# Patient Record
Sex: Female | Born: 1975 | Race: Black or African American | Hispanic: No | Marital: Single | Smoking: Never smoker
Health system: Southern US, Community
[De-identification: ages and names within clinical notes are randomized; demographics above are authoritative.]

## PROBLEM LIST (undated history)

## (undated) DIAGNOSIS — K589 Irritable bowel syndrome without diarrhea: Secondary | ICD-10-CM

## (undated) DIAGNOSIS — R011 Cardiac murmur, unspecified: Secondary | ICD-10-CM

## (undated) DIAGNOSIS — E669 Obesity, unspecified: Secondary | ICD-10-CM

## (undated) DIAGNOSIS — N83209 Unspecified ovarian cyst, unspecified side: Secondary | ICD-10-CM

## (undated) DIAGNOSIS — R51 Headache: Secondary | ICD-10-CM

## (undated) DIAGNOSIS — R600 Localized edema: Secondary | ICD-10-CM

## (undated) DIAGNOSIS — R7303 Prediabetes: Secondary | ICD-10-CM

## (undated) DIAGNOSIS — F32A Depression, unspecified: Secondary | ICD-10-CM

## (undated) DIAGNOSIS — E559 Vitamin D deficiency, unspecified: Secondary | ICD-10-CM

## (undated) DIAGNOSIS — T7840XA Allergy, unspecified, initial encounter: Secondary | ICD-10-CM

## (undated) DIAGNOSIS — G629 Polyneuropathy, unspecified: Secondary | ICD-10-CM

## (undated) DIAGNOSIS — M549 Dorsalgia, unspecified: Secondary | ICD-10-CM

## (undated) DIAGNOSIS — M1990 Unspecified osteoarthritis, unspecified site: Secondary | ICD-10-CM

## (undated) DIAGNOSIS — K219 Gastro-esophageal reflux disease without esophagitis: Secondary | ICD-10-CM

## (undated) DIAGNOSIS — I10 Essential (primary) hypertension: Secondary | ICD-10-CM

## (undated) DIAGNOSIS — M2550 Pain in unspecified joint: Secondary | ICD-10-CM

## (undated) DIAGNOSIS — F329 Major depressive disorder, single episode, unspecified: Secondary | ICD-10-CM

## (undated) HISTORY — DX: Vitamin D deficiency, unspecified: E55.9

## (undated) HISTORY — DX: Dorsalgia, unspecified: M54.9

## (undated) HISTORY — DX: Polyneuropathy, unspecified: G62.9

## (undated) HISTORY — DX: Irritable bowel syndrome, unspecified: K58.9

## (undated) HISTORY — DX: Pain in unspecified joint: M25.50

## (undated) HISTORY — DX: Allergy, unspecified, initial encounter: T78.40XA

## (undated) HISTORY — DX: Gastro-esophageal reflux disease without esophagitis: K21.9

## (undated) HISTORY — DX: Obesity, unspecified: E66.9

## (undated) HISTORY — DX: Localized edema: R60.0

## (undated) HISTORY — DX: Unspecified osteoarthritis, unspecified site: M19.90

## (undated) HISTORY — DX: Prediabetes: R73.03

---
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
## 1996-06-03 HISTORY — PX: TUBAL LIGATION: SHX77

## 1998-02-12 ENCOUNTER — Inpatient Hospital Stay (HOSPITAL_COMMUNITY): Admission: AD | Admit: 1998-02-12 | Discharge: 1998-02-12 | Payer: Self-pay | Admitting: *Deleted

## 1998-03-23 ENCOUNTER — Emergency Department (HOSPITAL_COMMUNITY): Admission: EM | Admit: 1998-03-23 | Discharge: 1998-03-24 | Payer: Self-pay | Admitting: Emergency Medicine

## 1998-04-30 ENCOUNTER — Inpatient Hospital Stay (HOSPITAL_COMMUNITY): Admission: AD | Admit: 1998-04-30 | Discharge: 1998-04-30 | Payer: Self-pay | Admitting: *Deleted

## 1999-02-17 ENCOUNTER — Inpatient Hospital Stay (HOSPITAL_COMMUNITY): Admission: AD | Admit: 1999-02-17 | Discharge: 1999-02-17 | Payer: Self-pay | Admitting: *Deleted

## 1999-05-05 ENCOUNTER — Emergency Department (HOSPITAL_COMMUNITY): Admission: EM | Admit: 1999-05-05 | Discharge: 1999-05-05 | Payer: Self-pay | Admitting: Internal Medicine

## 1999-05-05 ENCOUNTER — Encounter: Payer: Self-pay | Admitting: Family Medicine

## 1999-05-20 ENCOUNTER — Emergency Department (HOSPITAL_COMMUNITY): Admission: EM | Admit: 1999-05-20 | Discharge: 1999-05-20 | Payer: Self-pay | Admitting: Emergency Medicine

## 1999-07-13 ENCOUNTER — Inpatient Hospital Stay (HOSPITAL_COMMUNITY): Admission: AD | Admit: 1999-07-13 | Discharge: 1999-07-13 | Payer: Self-pay | Admitting: *Deleted

## 1999-10-20 ENCOUNTER — Encounter: Payer: Self-pay | Admitting: Emergency Medicine

## 1999-10-20 ENCOUNTER — Emergency Department (HOSPITAL_COMMUNITY): Admission: EM | Admit: 1999-10-20 | Discharge: 1999-10-20 | Payer: Self-pay | Admitting: Emergency Medicine

## 1999-11-08 ENCOUNTER — Emergency Department (HOSPITAL_COMMUNITY): Admission: EM | Admit: 1999-11-08 | Discharge: 1999-11-08 | Payer: Self-pay | Admitting: Emergency Medicine

## 1999-11-08 ENCOUNTER — Encounter: Payer: Self-pay | Admitting: Emergency Medicine

## 1999-11-20 ENCOUNTER — Emergency Department (HOSPITAL_COMMUNITY): Admission: EM | Admit: 1999-11-20 | Discharge: 1999-11-20 | Payer: Self-pay

## 1999-12-15 ENCOUNTER — Emergency Department (HOSPITAL_COMMUNITY): Admission: EM | Admit: 1999-12-15 | Discharge: 1999-12-15 | Payer: Self-pay | Admitting: *Deleted

## 2000-09-14 ENCOUNTER — Emergency Department (HOSPITAL_COMMUNITY): Admission: EM | Admit: 2000-09-14 | Discharge: 2000-09-15 | Payer: Self-pay | Admitting: Emergency Medicine

## 2000-09-22 ENCOUNTER — Emergency Department (HOSPITAL_COMMUNITY): Admission: EM | Admit: 2000-09-22 | Discharge: 2000-09-22 | Payer: Self-pay | Admitting: Emergency Medicine

## 2000-09-29 ENCOUNTER — Emergency Department (HOSPITAL_COMMUNITY): Admission: EM | Admit: 2000-09-29 | Discharge: 2000-09-29 | Payer: Self-pay | Admitting: Emergency Medicine

## 2000-10-18 ENCOUNTER — Emergency Department (HOSPITAL_COMMUNITY): Admission: EM | Admit: 2000-10-18 | Discharge: 2000-10-18 | Payer: Self-pay | Admitting: Emergency Medicine

## 2001-03-09 ENCOUNTER — Emergency Department (HOSPITAL_COMMUNITY): Admission: EM | Admit: 2001-03-09 | Discharge: 2001-03-10 | Payer: Self-pay | Admitting: Emergency Medicine

## 2001-03-10 ENCOUNTER — Encounter: Payer: Self-pay | Admitting: Emergency Medicine

## 2001-09-27 ENCOUNTER — Emergency Department (HOSPITAL_COMMUNITY): Admission: EM | Admit: 2001-09-27 | Discharge: 2001-09-27 | Payer: Self-pay

## 2001-09-27 ENCOUNTER — Encounter: Payer: Self-pay | Admitting: Emergency Medicine

## 2001-10-19 ENCOUNTER — Inpatient Hospital Stay (HOSPITAL_COMMUNITY): Admission: AD | Admit: 2001-10-19 | Discharge: 2001-10-19 | Payer: Self-pay | Admitting: *Deleted

## 2002-02-27 ENCOUNTER — Emergency Department (HOSPITAL_COMMUNITY): Admission: EM | Admit: 2002-02-27 | Discharge: 2002-02-28 | Payer: Self-pay | Admitting: Emergency Medicine

## 2002-09-22 ENCOUNTER — Other Ambulatory Visit: Admission: RE | Admit: 2002-09-22 | Discharge: 2002-09-22 | Payer: Self-pay | Admitting: Obstetrics and Gynecology

## 2002-09-24 ENCOUNTER — Ambulatory Visit (HOSPITAL_COMMUNITY): Admission: RE | Admit: 2002-09-24 | Discharge: 2002-09-24 | Payer: Self-pay | Admitting: Obstetrics and Gynecology

## 2002-09-24 ENCOUNTER — Encounter: Payer: Self-pay | Admitting: Obstetrics and Gynecology

## 2003-09-28 ENCOUNTER — Emergency Department (HOSPITAL_COMMUNITY): Admission: EM | Admit: 2003-09-28 | Discharge: 2003-09-28 | Payer: Self-pay | Admitting: Family Medicine

## 2003-12-27 ENCOUNTER — Emergency Department (HOSPITAL_COMMUNITY): Admission: EM | Admit: 2003-12-27 | Discharge: 2003-12-28 | Payer: Self-pay | Admitting: Emergency Medicine

## 2004-03-22 ENCOUNTER — Emergency Department (HOSPITAL_COMMUNITY): Admission: EM | Admit: 2004-03-22 | Discharge: 2004-03-22 | Payer: Self-pay | Admitting: *Deleted

## 2004-06-07 ENCOUNTER — Other Ambulatory Visit: Admission: RE | Admit: 2004-06-07 | Discharge: 2004-06-07 | Payer: Self-pay | Admitting: Obstetrics and Gynecology

## 2005-02-03 ENCOUNTER — Emergency Department (HOSPITAL_COMMUNITY): Admission: EM | Admit: 2005-02-03 | Discharge: 2005-02-03 | Payer: Self-pay | Admitting: Family Medicine

## 2005-06-26 ENCOUNTER — Emergency Department (HOSPITAL_COMMUNITY): Admission: EM | Admit: 2005-06-26 | Discharge: 2005-06-26 | Payer: Self-pay | Admitting: Family Medicine

## 2005-08-21 ENCOUNTER — Other Ambulatory Visit: Admission: RE | Admit: 2005-08-21 | Discharge: 2005-08-21 | Payer: Self-pay | Admitting: Obstetrics and Gynecology

## 2006-01-22 ENCOUNTER — Encounter: Admission: RE | Admit: 2006-01-22 | Discharge: 2006-01-22 | Payer: Self-pay | Admitting: Podiatry

## 2006-01-24 ENCOUNTER — Emergency Department (HOSPITAL_COMMUNITY): Admission: EM | Admit: 2006-01-24 | Discharge: 2006-01-24 | Payer: Self-pay | Admitting: Family Medicine

## 2006-02-01 ENCOUNTER — Emergency Department (HOSPITAL_COMMUNITY): Admission: EM | Admit: 2006-02-01 | Discharge: 2006-02-01 | Payer: Self-pay | Admitting: Family Medicine

## 2006-06-25 ENCOUNTER — Emergency Department (HOSPITAL_COMMUNITY): Admission: EM | Admit: 2006-06-25 | Discharge: 2006-06-25 | Payer: Self-pay | Admitting: Family Medicine

## 2006-08-23 ENCOUNTER — Emergency Department (HOSPITAL_COMMUNITY): Admission: EM | Admit: 2006-08-23 | Discharge: 2006-08-23 | Payer: Self-pay | Admitting: Family Medicine

## 2006-12-29 ENCOUNTER — Emergency Department (HOSPITAL_COMMUNITY): Admission: EM | Admit: 2006-12-29 | Discharge: 2006-12-29 | Payer: Self-pay | Admitting: Emergency Medicine

## 2007-02-15 ENCOUNTER — Emergency Department (HOSPITAL_COMMUNITY): Admission: EM | Admit: 2007-02-15 | Discharge: 2007-02-15 | Payer: Self-pay | Admitting: Emergency Medicine

## 2007-05-04 ENCOUNTER — Emergency Department (HOSPITAL_COMMUNITY): Admission: EM | Admit: 2007-05-04 | Discharge: 2007-05-04 | Payer: Self-pay | Admitting: Family Medicine

## 2007-06-25 ENCOUNTER — Emergency Department (HOSPITAL_COMMUNITY): Admission: EM | Admit: 2007-06-25 | Discharge: 2007-06-25 | Payer: Self-pay | Admitting: Family Medicine

## 2007-10-05 ENCOUNTER — Emergency Department (HOSPITAL_COMMUNITY): Admission: EM | Admit: 2007-10-05 | Discharge: 2007-10-05 | Payer: Self-pay | Admitting: Family Medicine

## 2008-01-07 ENCOUNTER — Emergency Department (HOSPITAL_COMMUNITY): Admission: EM | Admit: 2008-01-07 | Discharge: 2008-01-07 | Payer: Self-pay | Admitting: Family Medicine

## 2008-01-25 ENCOUNTER — Emergency Department (HOSPITAL_COMMUNITY): Admission: EM | Admit: 2008-01-25 | Discharge: 2008-01-25 | Payer: Self-pay | Admitting: Emergency Medicine

## 2008-03-02 ENCOUNTER — Emergency Department (HOSPITAL_COMMUNITY): Admission: EM | Admit: 2008-03-02 | Discharge: 2008-03-02 | Payer: Self-pay | Admitting: Family Medicine

## 2008-05-24 ENCOUNTER — Emergency Department (HOSPITAL_COMMUNITY): Admission: EM | Admit: 2008-05-24 | Discharge: 2008-05-24 | Payer: Self-pay | Admitting: Family Medicine

## 2008-08-11 ENCOUNTER — Emergency Department (HOSPITAL_COMMUNITY): Admission: EM | Admit: 2008-08-11 | Discharge: 2008-08-12 | Payer: Self-pay | Admitting: Emergency Medicine

## 2008-08-26 ENCOUNTER — Emergency Department (HOSPITAL_COMMUNITY): Admission: EM | Admit: 2008-08-26 | Discharge: 2008-08-26 | Payer: Self-pay | Admitting: Emergency Medicine

## 2008-08-30 ENCOUNTER — Emergency Department (HOSPITAL_COMMUNITY): Admission: EM | Admit: 2008-08-30 | Discharge: 2008-08-30 | Payer: Self-pay | Admitting: Emergency Medicine

## 2008-09-19 ENCOUNTER — Emergency Department (HOSPITAL_COMMUNITY): Admission: EM | Admit: 2008-09-19 | Discharge: 2008-09-19 | Payer: Self-pay | Admitting: Family Medicine

## 2009-02-21 ENCOUNTER — Emergency Department (HOSPITAL_COMMUNITY): Admission: EM | Admit: 2009-02-21 | Discharge: 2009-02-21 | Payer: Self-pay | Admitting: Emergency Medicine

## 2009-05-10 ENCOUNTER — Emergency Department (HOSPITAL_COMMUNITY): Admission: EM | Admit: 2009-05-10 | Discharge: 2009-05-10 | Payer: Self-pay | Admitting: Family Medicine

## 2009-07-16 ENCOUNTER — Emergency Department (HOSPITAL_COMMUNITY): Admission: EM | Admit: 2009-07-16 | Discharge: 2009-07-16 | Payer: Self-pay | Admitting: Emergency Medicine

## 2009-09-26 ENCOUNTER — Emergency Department (HOSPITAL_COMMUNITY): Admission: EM | Admit: 2009-09-26 | Discharge: 2009-09-26 | Payer: Self-pay | Admitting: Family Medicine

## 2009-10-11 ENCOUNTER — Emergency Department (HOSPITAL_COMMUNITY): Admission: EM | Admit: 2009-10-11 | Discharge: 2009-10-11 | Payer: Self-pay | Admitting: Emergency Medicine

## 2009-10-27 ENCOUNTER — Emergency Department (HOSPITAL_COMMUNITY): Admission: EM | Admit: 2009-10-27 | Discharge: 2009-10-27 | Payer: Self-pay | Admitting: Family Medicine

## 2010-06-17 ENCOUNTER — Emergency Department (HOSPITAL_COMMUNITY)
Admission: EM | Admit: 2010-06-17 | Discharge: 2010-06-17 | Payer: Self-pay | Source: Home / Self Care | Admitting: Family Medicine

## 2010-06-18 LAB — POCT URINALYSIS DIPSTICK
Bilirubin Urine: NEGATIVE
Hgb urine dipstick: NEGATIVE
Nitrite: NEGATIVE
Protein, ur: NEGATIVE mg/dL
Specific Gravity, Urine: >=1.03 (ref 1.005–1.030)
Urine Glucose, Fasting: NEGATIVE mg/dL
Urobilinogen, UA: 0.2 mg/dL (ref 0.0–1.0)
pH: 5.5 (ref 5.0–8.0)

## 2010-06-24 ENCOUNTER — Encounter: Payer: Self-pay | Admitting: Orthopedic Surgery

## 2010-08-20 LAB — POCT URINALYSIS DIP (DEVICE)
Bilirubin Urine: NEGATIVE
Glucose, UA: NEGATIVE mg/dL
Hgb urine dipstick: NEGATIVE
Ketones, ur: NEGATIVE mg/dL
Nitrite: NEGATIVE
Protein, ur: NEGATIVE mg/dL
Specific Gravity, Urine: >=1.03 (ref 1.005–1.030)
Urobilinogen, UA: 0.2 mg/dL (ref 0.0–1.0)
pH: 5.5 (ref 5.0–8.0)

## 2010-08-20 LAB — POCT PREGNANCY, URINE: Preg Test, Ur: NEGATIVE

## 2010-08-21 LAB — POCT URINALYSIS DIP (DEVICE)
Bilirubin Urine: NEGATIVE
Glucose, UA: NEGATIVE mg/dL
Glucose, UA: NEGATIVE mg/dL
Ketones, ur: NEGATIVE mg/dL
Nitrite: NEGATIVE
Nitrite: NEGATIVE
Protein, ur: 100 mg/dL — AB
Protein, ur: 100 mg/dL — AB
Specific Gravity, Urine: >=1.03 (ref 1.005–1.030)
Specific Gravity, Urine: >=1.03 (ref 1.005–1.030)
Urobilinogen, UA: 0.2 mg/dL (ref 0.0–1.0)
Urobilinogen, UA: 0.2 mg/dL (ref 0.0–1.0)
pH: 5 (ref 5.0–8.0)
pH: 5.5 (ref 5.0–8.0)

## 2010-08-21 LAB — URINE CULTURE: Colony Count: >=100000

## 2010-08-21 LAB — WET PREP, GENITAL
Trich, Wet Prep: NONE SEEN
WBC, Wet Prep HPF POC: NONE SEEN
Yeast Wet Prep HPF POC: NONE SEEN

## 2010-08-21 LAB — POCT PREGNANCY, URINE
Preg Test, Ur: NEGATIVE
Preg Test, Ur: NEGATIVE

## 2010-08-21 LAB — GC/CHLAMYDIA PROBE AMP, GENITAL
Chlamydia, DNA Probe: NEGATIVE
GC Probe Amp, Genital: NEGATIVE

## 2010-09-04 LAB — POCT URINALYSIS DIP (DEVICE)
Hgb urine dipstick: NEGATIVE
Nitrite: NEGATIVE
Protein, ur: NEGATIVE mg/dL
Urobilinogen, UA: 0.2 mg/dL (ref 0.0–1.0)
pH: 7 (ref 5.0–8.0)

## 2010-09-13 LAB — POCT I-STAT, CHEM 8
BUN: 13 mg/dL (ref 6–23)
Calcium, Ion: 1.19 mmol/L (ref 1.12–1.32)
HCT: 45 % (ref 36.0–46.0)
Hemoglobin: 15.3 g/dL — ABNORMAL HIGH (ref 12.0–15.0)
Sodium: 144 mEq/L (ref 135–145)
TCO2: 22 mmol/L (ref 0–100)

## 2010-09-13 LAB — RPR: RPR Ser Ql: NONREACTIVE

## 2010-09-13 LAB — WET PREP, GENITAL: Trich, Wet Prep: NONE SEEN

## 2010-09-13 LAB — CBC
Hemoglobin: 14.6 g/dL (ref 12.0–15.0)
RBC: 5.04 MIL/uL (ref 3.87–5.11)
RDW: 12.7 % (ref 11.5–15.5)

## 2010-09-13 LAB — DIFFERENTIAL
Basophils Absolute: 0 10*3/uL (ref 0.0–0.1)
Lymphocytes Relative: 37 % (ref 12–46)
Monocytes Absolute: 0.4 10*3/uL (ref 0.1–1.0)
Neutro Abs: 4.3 10*3/uL (ref 1.7–7.7)
Neutrophils Relative %: 55 % (ref 43–77)

## 2010-09-13 LAB — URINALYSIS, ROUTINE W REFLEX MICROSCOPIC
Glucose, UA: NEGATIVE mg/dL
Hgb urine dipstick: NEGATIVE
Ketones, ur: NEGATIVE mg/dL
Protein, ur: NEGATIVE mg/dL

## 2010-09-13 LAB — GC/CHLAMYDIA PROBE AMP, GENITAL: Chlamydia, DNA Probe: NEGATIVE

## 2010-09-13 LAB — PREGNANCY, URINE: Preg Test, Ur: NEGATIVE

## 2011-01-14 ENCOUNTER — Other Ambulatory Visit: Payer: Self-pay | Admitting: Rheumatology

## 2011-01-14 ENCOUNTER — Ambulatory Visit
Admission: RE | Admit: 2011-01-14 | Discharge: 2011-01-14 | Disposition: A | Discharge status: Home / Self Care | Payer: Medicaid Other | Source: Ambulatory Visit | Attending: Rheumatology | Admitting: Rheumatology

## 2011-01-14 DIAGNOSIS — M545 Low back pain: Secondary | ICD-10-CM

## 2011-03-01 LAB — POCT PREGNANCY, URINE: Preg Test, Ur: NEGATIVE

## 2011-03-01 LAB — POCT URINALYSIS DIP (DEVICE)
Ketones, ur: NEGATIVE
Specific Gravity, Urine: 1.025

## 2011-03-01 LAB — POCT RAPID STREP A: Streptococcus, Group A Screen (Direct): NEGATIVE

## 2011-03-04 LAB — WET PREP, GENITAL: Yeast Wet Prep HPF POC: NONE SEEN

## 2011-03-04 LAB — POCT URINALYSIS DIP (DEVICE)
Bilirubin Urine: NEGATIVE
Glucose, UA: NEGATIVE
Ketones, ur: NEGATIVE
Operator id: 239701

## 2011-03-04 LAB — URINE CULTURE
Colony Count: NO GROWTH
Culture: NO GROWTH

## 2011-03-04 LAB — POCT PREGNANCY, URINE: Preg Test, Ur: NEGATIVE

## 2011-03-08 LAB — CULTURE, ROUTINE-ABSCESS: Culture: NO GROWTH

## 2011-03-11 LAB — POCT URINALYSIS DIP (DEVICE)
Nitrite: NEGATIVE
pH: 6

## 2011-03-15 LAB — POCT URINALYSIS DIP (DEVICE)
Ketones, ur: NEGATIVE
Nitrite: NEGATIVE
pH: 5.5

## 2011-03-18 LAB — POCT URINALYSIS DIP (DEVICE)
Bilirubin Urine: NEGATIVE
Glucose, UA: NEGATIVE
Ketones, ur: NEGATIVE
Operator id: 239701
Specific Gravity, Urine: 1.025

## 2011-06-11 ENCOUNTER — Encounter: Payer: Self-pay | Admitting: *Deleted

## 2011-06-11 ENCOUNTER — Emergency Department (INDEPENDENT_AMBULATORY_CARE_PROVIDER_SITE_OTHER): Payer: Medicaid Other

## 2011-06-11 ENCOUNTER — Emergency Department (HOSPITAL_COMMUNITY)
Admission: EM | Admit: 2011-06-11 | Discharge: 2011-06-11 | Disposition: A | Discharge status: Home / Self Care | Payer: Medicaid Other | Source: Home / Self Care | Attending: Emergency Medicine | Admitting: Emergency Medicine

## 2011-06-11 DIAGNOSIS — S7010XA Contusion of unspecified thigh, initial encounter: Secondary | ICD-10-CM

## 2011-06-11 DIAGNOSIS — IMO0002 Reserved for concepts with insufficient information to code with codable children: Secondary | ICD-10-CM

## 2011-06-11 HISTORY — DX: Essential (primary) hypertension: I10

## 2011-06-11 MED ORDER — IBUPROFEN 400 MG PO TABS: 400.0000 mg | ORAL_TABLET | Freq: Four times a day (QID) | ORAL | Status: AC | PRN

## 2011-06-11 MED ORDER — TRAMADOL HCL 50 MG PO TABS: 50.0000 mg | ORAL_TABLET | Freq: Four times a day (QID) | ORAL | Status: AC | PRN

## 2011-06-11 NOTE — ED Notes (Signed)
Pt fell at home on a slippery wooden floor about 1100 today.  C/o pain right hip and lateral thigh.  She ambulated in the exam room with a limp

## 2011-06-11 NOTE — ED Provider Notes (Signed)
History     CSN: [id number]  Arrival date & time 06/11/11  1130   First MD Initiated Contact with Patient 06/11/11 1210      No chief complaint on file.   (Consider location/radiation/quality/duration/timing/severity/associated sxs/prior treatment) HPI Comments: At home i slid sideways falling on my R leg, hurts all over my leg here (points to posterior and lateral aspect of R thigh) "its all sore" fell on wooden floor"  Patient is a 36 y.o. female presenting with leg pain. The history is provided by the patient.  Leg Pain  The incident occurred 1 to 2 hours ago. The incident occurred at home. The pain is present in the right thigh. The pain is at a severity of 8/10. The pain is moderate. The pain has been constant since onset. Associated symptoms include loss of motion. Pertinent negatives include no numbness, no inability to bear weight, no muscle weakness, no loss of sensation and no tingling. The symptoms are aggravated by nothing. She has tried NSAIDs for the symptoms. The treatment provided no relief.    No past medical history on file.  No past surgical history on file.  No family history on file.  History  Substance Use Topics  . Smoking status: Not on file  . Smokeless tobacco: Not on file  . Alcohol Use: Not on file    OB History    No data available      Review of Systems  Constitutional: Negative for fever and fatigue.  Cardiovascular: Negative for leg swelling.  Neurological: Negative for tingling, weakness and numbness.    Allergies  Review of patient's allergies indicates not on file.  Home Medications  No current outpatient prescriptions on file.  BP 126/83  Pulse 90  Temp(Src) 98.1 F (36.7 C) (Oral)  Resp 20  SpO2 97%  LMP 05/28/2011  Physical Exam  Nursing note and vitals reviewed. Constitutional: She appears well-developed and well-nourished. No distress.  HENT:  Head: Normocephalic.  Eyes: Conjunctivae are normal.  Neck: Neck supple.   Cardiovascular: Normal rate.   Pulmonary/Chest: Effort normal.  Abdominal: She exhibits no distension.  Musculoskeletal:       Right hip: She exhibits decreased range of motion and tenderness. She exhibits normal strength, no swelling, no crepitus, no deformity and no laceration.       Legs: Neurological: She is alert.  Skin: No rash noted. No erythema.    ED Course  Procedures (including critical care time)  Labs Reviewed - No data to display No results found.   No diagnosis found.    MDM  Morbid obesity- FALL without fracture or obvious signs of tissue contusion-symptomatic management        Jimmie Molly, MD 06/11/11 1343

## 2011-06-18 ENCOUNTER — Encounter (HOSPITAL_COMMUNITY): Payer: Self-pay | Admitting: *Deleted

## 2011-06-18 ENCOUNTER — Emergency Department (HOSPITAL_COMMUNITY)
Admission: EM | Admit: 2011-06-18 | Discharge: 2011-06-18 | Disposition: A | Discharge status: Home / Self Care | Payer: Medicaid Other | Source: Home / Self Care | Attending: Emergency Medicine | Admitting: Emergency Medicine

## 2011-06-18 DIAGNOSIS — L509 Urticaria, unspecified: Secondary | ICD-10-CM

## 2011-06-18 MED ORDER — PREDNISONE 20 MG PO TABS: 20.0000 mg | ORAL_TABLET | Freq: Every day | ORAL | Status: AC

## 2011-06-18 MED ORDER — PREDNISONE 20 MG PO TABS: 20.0000 mg | ORAL_TABLET | Freq: Every day | ORAL | Status: DC

## 2011-06-18 MED ORDER — HYDROXYZINE HCL 25 MG PO TABS: 25.0000 mg | ORAL_TABLET | Freq: Four times a day (QID) | ORAL | Status: AC

## 2011-06-18 NOTE — ED Provider Notes (Addendum)
History     CSN: [id number]  Arrival date & time 06/18/11  1141   First MD Initiated Contact with Patient 06/18/11 1238      Chief Complaint  Patient presents with  . Rash    (Consider location/radiation/quality/duration/timing/severity/associated sxs/prior treatment) HPI Comments: Went yesterday to my cousins house, stayed there for some hours, it itches, she also has a rash all over, we think i was biting by fleas or bed bugs, it itches a lot  No fevers No malaise No HA No SOB NO RASH ON LOWER EXTREMETIES  Patient is a 36 y.o. female presenting with rash. The history is provided by the patient.  Rash  This is a new problem. The current episode started yesterday. The problem has not changed since onset.There has been no fever. The rash is present on the left wrist and right hand (r shoulder). The pain is at a severity of 0/10. The patient is experiencing no pain. The pain has been constant since onset. Associated symptoms include itching. Pertinent negatives include no pain and no weeping. She has tried nothing for the symptoms. The treatment provided mild relief.    Past Medical History  Diagnosis Date  . Asthma   . Hypertension     Past Surgical History  Procedure Date  . Tubal ligation     Family History  Problem Relation Age of Onset  . Hypertension Mother   . Cancer Son     History  Substance Use Topics  . Smoking status: Never Smoker   . Smokeless tobacco: Not on file  . Alcohol Use: No    OB History    Grav Para Term Preterm Abortions TAB SAB Ect Mult Living                  Review of Systems  Constitutional: Negative for fever, activity change, appetite change and fatigue.  HENT: Negative for congestion, rhinorrhea and sinus pressure.   Respiratory: Negative for cough and shortness of breath.   Skin: Positive for itching and rash. Negative for color change.    Allergies  Doxycycline and Flagyl  Home Medications   Current Outpatient Rx    Name Route Sig Dispense Refill  . ALBUTEROL SULFATE HFA 108 (90 BASE) MCG/ACT IN AERS Inhalation Inhale 2 puffs into the lungs every 6 (six) hours as needed.      . IBUPROFEN 400 MG PO TABS Oral Take 1 tablet (400 mg total) by mouth every 6 (six) hours as needed for pain. 15 tablet 0  . TRIAMTERENE-HCTZ 37.5-25 MG PO CAPS Oral Take 1 capsule by mouth every morning.      Marland Kitchen HYDROXYZINE HCL 25 MG PO TABS Oral Take 1 tablet (25 mg total) by mouth every 6 (six) hours. 12 tablet 0  . PREDNISONE 20 MG PO TABS Oral Take 1 tablet (20 mg total) by mouth daily. 15 tablet 0  . TRAMADOL HCL 50 MG PO TABS Oral Take 1 tablet (50 mg total) by mouth every 6 (six) hours as needed for pain. 15 tablet 0    BP 131/89  Pulse 88  Temp(Src) 98.6 F (37 C) (Oral)  Resp 24  SpO2 98%  LMP 05/28/2011  Physical Exam  Nursing note and vitals reviewed. Constitutional: She appears well-developed and well-nourished. No distress.  HENT:  Head: Atraumatic.  Mouth/Throat: No oropharyngeal exudate.  Eyes: EOM are normal. Left eye exhibits no discharge.  Neck: Neck supple. No thyromegaly present.  Abdominal: She exhibits no distension. There is  no tenderness.  Musculoskeletal: Normal range of motion.  Lymphadenopathy:    She has no cervical adenopathy.  Skin: Rash noted. Rash is papular and urticarial. There is erythema.       ED Course  Procedures (including critical care time)  Labs Reviewed - No data to display No results found.   1. Hives       MDM  Hives, questionable bed bugs        Jimmie Molly, MD 06/18/11 1416  Jimmie Molly, MD 06/18/11 1419  Jimmie Molly, MD 06/18/11 1420

## 2011-06-18 NOTE — ED Notes (Signed)
Pt noticed a circular, red, raised rash on the left wrist and right shoulder last night

## 2012-01-03 ENCOUNTER — Inpatient Hospital Stay (HOSPITAL_COMMUNITY)
Admission: AD | Admit: 2012-01-03 | Discharge: 2012-01-03 | Disposition: A | Discharge status: Home / Self Care | Payer: Medicaid Other | Source: Ambulatory Visit | Attending: Obstetrics and Gynecology | Admitting: Obstetrics and Gynecology

## 2012-01-03 ENCOUNTER — Inpatient Hospital Stay (HOSPITAL_COMMUNITY): Payer: Medicaid Other

## 2012-01-03 ENCOUNTER — Encounter (HOSPITAL_COMMUNITY): Payer: Self-pay | Admitting: *Deleted

## 2012-01-03 DIAGNOSIS — N949 Unspecified condition associated with female genital organs and menstrual cycle: Secondary | ICD-10-CM | POA: Insufficient documentation

## 2012-01-03 DIAGNOSIS — N898 Other specified noninflammatory disorders of vagina: Secondary | ICD-10-CM

## 2012-01-03 DIAGNOSIS — M549 Dorsalgia, unspecified: Secondary | ICD-10-CM | POA: Insufficient documentation

## 2012-01-03 DIAGNOSIS — N939 Abnormal uterine and vaginal bleeding, unspecified: Secondary | ICD-10-CM

## 2012-01-03 DIAGNOSIS — N938 Other specified abnormal uterine and vaginal bleeding: Secondary | ICD-10-CM | POA: Insufficient documentation

## 2012-01-03 DIAGNOSIS — R109 Unspecified abdominal pain: Secondary | ICD-10-CM | POA: Insufficient documentation

## 2012-01-03 DIAGNOSIS — D259 Leiomyoma of uterus, unspecified: Secondary | ICD-10-CM | POA: Insufficient documentation

## 2012-01-03 HISTORY — DX: Major depressive disorder, single episode, unspecified: F32.9

## 2012-01-03 HISTORY — DX: Cardiac murmur, unspecified: R01.1

## 2012-01-03 HISTORY — DX: Headache: R51

## 2012-01-03 HISTORY — DX: Depression, unspecified: F32.A

## 2012-01-03 HISTORY — DX: Unspecified ovarian cyst, unspecified side: N83.209

## 2012-01-03 LAB — URINALYSIS, ROUTINE W REFLEX MICROSCOPIC
Nitrite: NEGATIVE
Specific Gravity, Urine: 1.02 (ref 1.005–1.030)
Urobilinogen, UA: 0.2 mg/dL (ref 0.0–1.0)
pH: 6 (ref 5.0–8.0)

## 2012-01-03 LAB — CBC WITH DIFFERENTIAL/PLATELET
Eosinophils Absolute: 0.2 10*3/uL (ref 0.0–0.7)
Hemoglobin: 12.9 g/dL (ref 12.0–15.0)
Lymphocytes Relative: 40 % (ref 12–46)
Lymphs Abs: 2.3 10*3/uL (ref 0.7–4.0)
Neutro Abs: 2.8 10*3/uL (ref 1.7–7.7)
Neutrophils Relative %: 50 % (ref 43–77)
Platelets: 306 10*3/uL (ref 150–400)
RBC: 4.68 MIL/uL (ref 3.87–5.11)
WBC: 5.6 10*3/uL (ref 4.0–10.5)

## 2012-01-03 LAB — POCT PREGNANCY, URINE: Preg Test, Ur: NEGATIVE

## 2012-01-03 LAB — WET PREP, GENITAL

## 2012-01-03 LAB — URINE MICROSCOPIC-ADD ON

## 2012-01-03 MED ORDER — KETOROLAC TROMETHAMINE 60 MG/2ML IM SOLN
60.0000 mg | Freq: Once | INTRAMUSCULAR | Status: AC
  Administered 2012-01-03: 60 mg via INTRAMUSCULAR
  Filled 2012-01-03: qty 2

## 2012-01-03 NOTE — MAU Note (Signed)
Patient states she has had breast tenderness for 4 weeks. Had a period on 7-15 and has started bleeding again on 8-1. Started having left lower abdominal and left back pain yesterday and is worse today.

## 2012-01-03 NOTE — MAU Note (Signed)
Worst pain is in low back, and around to LLQ. Started yesterday, as did her bleeding. LMP 07/15.  Getting worse.  Breast tenderness past 4 wks. (tender to touch), no hot spots, areas of redness, nipple drainage or bleeding.

## 2012-01-03 NOTE — MAU Provider Note (Signed)
History     CSN: [id number]  Arrival date and time: 01/03/12 1241   First Provider Initiated Contact with Patient 01/03/12 1500      Chief Complaint  Patient presents with  . Breast Pain  . Abdominal Pain  . Back Pain  . Vaginal Bleeding   HPI Angela [name] is 36 y.o. [id number] presents with pain in the mid back and up, lower abdominal pain and breast tenderness for 4 weeks.  Hx of menstrual cycles, LMP 7/15.  Began bleeding yesterday, unusual for her.  1 Sexual partner X 3 years.  Had BTL 3 years ago.   Denies injury.  Was seen by her PCP last week, breast exam was negative.       Past Medical History  Diagnosis Date  . Asthma   . Preterm labor   . Headache   . Heart murmur   . Hypertension     on meds  . Depression     hx, doing ok now  . Ovarian cyst     Past Surgical History  Procedure Date  . Tubal ligation     Family History  Problem Relation Age of Onset  . Hypertension Mother   . Cancer Son   . Other Neg Hx     History  Substance Use Topics  . Smoking status: Never Smoker   . Smokeless tobacco: Never Used  . Alcohol Use: No    Allergies:  Allergies  Allergen Reactions  . Doxycycline Nausea And Vomiting  . Flagyl (Metronidazole Hcl) Hives and Nausea And Vomiting    Prescriptions prior to admission  Medication Sig Dispense Refill  . acetaminophen (TYLENOL) 500 MG tablet Take 1,000 mg by mouth every 6 (six) hours as needed. For pain      . albuterol (PROVENTIL HFA;VENTOLIN HFA) 108 (90 BASE) MCG/ACT inhaler Inhale 2 puffs into the lungs every 6 (six) hours as needed. For shortness of breath      . loratadine (CLARITIN) 10 MG tablet Take 10 mg by mouth daily.      Marland Kitchen triamterene-hydrochlorothiazide (DYAZIDE) 37.5-25 MG per capsule Take 1 capsule by mouth every morning.          Review of Systems  Gastrointestinal: Positive for abdominal pain. Negative for nausea and vomiting.  Genitourinary:       Early vaginal bleeding without pain.  Denies  abnormal vaginal discharge  Musculoskeletal: Positive for back pain.  Neurological: Negative for headaches.   Physical Exam   Blood pressure 148/91, pulse 77, temperature 98.7 F (37.1 C), temperature source Oral, resp. rate 20, height 5\' 1"  (1.549 m), weight 126.554 kg (279 lb), last menstrual period 12/16/2011, SpO2 10.00%.  Physical Exam  Constitutional: She is oriented to person, place, and time. She appears well-developed and well-nourished. No distress.  HENT:  Head: Normocephalic.  Neck: Normal range of motion.  Cardiovascular: Normal rate.   Respiratory: Effort normal.  GI: Soft. She exhibits no distension and no mass. There is no tenderness. There is no rebound and no guarding.  Genitourinary: Uterus is tender. Right adnexum displays no mass, no tenderness and no fullness. Left adnexum displays no mass, no tenderness and no fullness. There is bleeding (moderate amount of bright red blood with a few clots) around the vagina.       Difficult to assess size b/c body habitus  Musculoskeletal:       + for mid back pain  Neurological: She is alert and oriented to person, place, and time.  Skin: Skin is warm and dry.  Psychiatric: She has a normal mood and affect. Her behavior is normal.    Results for orders placed during the hospital encounter of 01/03/12 (from the past 24 hour(s))  URINALYSIS, ROUTINE W REFLEX MICROSCOPIC     Status: Abnormal   Collection Time   01/03/12  1:35 PM      Component Value Range   Color, Urine YELLOW  YELLOW   APPearance CLEAR  CLEAR   Specific Gravity, Urine 1.020  1.005 - 1.030   pH 6.0  5.0 - 8.0   Glucose, UA NEGATIVE  NEGATIVE mg/dL   Hgb urine dipstick LARGE (*) NEGATIVE   Bilirubin Urine NEGATIVE  NEGATIVE   Ketones, ur NEGATIVE  NEGATIVE mg/dL   Protein, ur NEGATIVE  NEGATIVE mg/dL   Urobilinogen, UA 0.2  0.0 - 1.0 mg/dL   Nitrite NEGATIVE  NEGATIVE   Leukocytes, UA NEGATIVE  NEGATIVE  URINE MICROSCOPIC-ADD ON     Status: Abnormal    Collection Time   01/03/12  1:35 PM      Component Value Range   Squamous Epithelial / LPF FEW (*) RARE   WBC, UA 0-2  <3 WBC/hpf   RBC / HPF 21-50  <3 RBC/hpf   Bacteria, UA FEW (*) RARE  POCT PREGNANCY, URINE     Status: Normal   Collection Time   01/03/12  1:41 PM      Component Value Range   Preg Test, Ur NEGATIVE  NEGATIVE  WET PREP, GENITAL     Status: Abnormal   Collection Time   01/03/12  3:15 PM      Component Value Range   Yeast Wet Prep HPF POC NONE SEEN  NONE SEEN   Trich, Wet Prep NONE SEEN  NONE SEEN   Clue Cells Wet Prep HPF POC FEW (*) NONE SEEN   WBC, Wet Prep HPF POC FEW (*) NONE SEEN       *RADIOLOGY REPORT*  Clinical Data: Left lower quadrant pain and abnormal vaginal  bleeding. LMP 12/16/2011  TRANSABDOMINAL AND TRANSVAGINAL ULTRASOUND OF PELVIS  Technique: Both transabdominal and transvaginal ultrasound  examinations of the pelvis were performed. Transabdominal technique  was performed for global imaging of the pelvis including uterus,  ovaries, adnexal regions, and pelvic cul-de-sac.  It was necessary to proceed with endovaginal exam following the  transabdominal exam to visualize the myometrium, endometrium and  adnexa.  Comparison: Report from 2004  Findings:  Uterus: The uterus is enlarged with a sagittal length of 12.1 cm,  depth of 5.9 cm and width of 5.0 cm. Focal fibroids are identified  and best visualized on the transabdominal exam. These have sizes  and locations as follows: In the right fundal region measuring 2.8  x 2.9 x 3.2 cm, in the anterior right midbody measuring 2.5 x 2.3 x  2.8 cm, in the anterior left lower uterine segment measuring 2.0 x  1.9 x 2.5 cm, in the left fundal region measuring 2.9 x 2.8 x 3.1  cm and in the anterior midbody measuring 1.0 x 0.6 x 0.9 cm. All  of the aforementioned fibroids appear to be mural in location.  Endometrium: Appears echogenic with a width of 8.3 mm. Portions of  the endometrial lining are  obscured due to the fibroid load but no  definite focal lesion is identified  Right ovary: Has a normal appearance measuring 3.1 x 1.5 x 2.2 cm  Left ovary: Has a normal appearance measuring 2.4 x  1.4 x 2.5 cm  and is best seen transabdominally.  Other findings: No pelvic fluid or separate adnexal masses are  seen.  IMPRESSION:  Fibroid uterus with fibroid sizes and locations as noted above and  best evaluated transabdominally due to imaging parameters on  endovaginal exam.  No definite endometrial abnormality and normal ovaries.  Original Report Authenticated By: Bertha Stakes, M.D.     MAU Course  Procedures   TOradol 60mg  IM given for discomfort.  MDM Patient returned from ultrasound, cannot wait for results of lab work because she has a test at school.   CBC pending  Will refer to GYN CLINIC for evaluation.  Assessment and Plan  A:  Abnormal Vaginal bleeding      Uterine fibroid  P:  Referral to GYN CLINIC              Randon Somera,EVE M 01/03/2012, 4:49 PM

## 2012-01-04 LAB — GC/CHLAMYDIA PROBE AMP, GENITAL
Chlamydia, DNA Probe: NEGATIVE
GC Probe Amp, Genital: NEGATIVE

## 2012-01-06 NOTE — MAU Provider Note (Signed)
Agree with above note.  Hashem Goynes 01/06/2012 10:18 AM   

## 2012-02-05 ENCOUNTER — Encounter: Payer: Self-pay | Admitting: Obstetrics & Gynecology

## 2012-02-05 ENCOUNTER — Ambulatory Visit (INDEPENDENT_AMBULATORY_CARE_PROVIDER_SITE_OTHER): Payer: Medicaid Other | Admitting: Obstetrics & Gynecology

## 2012-02-05 VITALS — BP 141/100 | HR 87 | Temp 98.6°F | Resp 12 | Ht 61.0 in | Wt 281.8 lb

## 2012-02-05 DIAGNOSIS — D259 Leiomyoma of uterus, unspecified: Secondary | ICD-10-CM

## 2012-02-05 DIAGNOSIS — N939 Abnormal uterine and vaginal bleeding, unspecified: Secondary | ICD-10-CM | POA: Insufficient documentation

## 2012-02-05 DIAGNOSIS — N949 Unspecified condition associated with female genital organs and menstrual cycle: Secondary | ICD-10-CM

## 2012-02-05 DIAGNOSIS — D219 Benign neoplasm of connective and other soft tissue, unspecified: Secondary | ICD-10-CM | POA: Insufficient documentation

## 2012-02-05 DIAGNOSIS — R102 Pelvic and perineal pain: Secondary | ICD-10-CM | POA: Insufficient documentation

## 2012-02-05 MED ORDER — DICLOFENAC SODIUM 75 MG PO TBEC: 75.0000 mg | DELAYED_RELEASE_TABLET | Freq: Two times a day (BID) | ORAL | Status: AC

## 2012-02-05 MED ORDER — MEDROXYPROGESTERONE ACETATE 10 MG PO TABS: 20.0000 mg | ORAL_TABLET | Freq: Every day | ORAL | Status: DC

## 2012-02-05 NOTE — Patient Instructions (Addendum)
Fibroids You have been diagnosed as having a fibroid. Fibroids are smooth muscle lumps (tumors) which can occur any place in a woman's body. They are usually in the womb (uterus). The most common problem (symptom) of fibroids is bleeding. Over time this may cause low red blood cells (anemia). Other symptoms include feelings of pressure and pain in the pelvis. The diagnosis (learning what is wrong) of fibroids is made by physical exam. Sometimes tests such as an ultrasound are used. This is helpful when fibroids are felt around the ovaries and to look for tumors. TREATMENT   Most fibroids do not need surgical or medical treatment. Sometimes a tissue sample (biopsy) of the lining of the uterus is done to rule out cancer. If there is no cancer and only a small amount of bleeding, the problem can be watched.   Hormonal treatment can improve the problem.   When surgery is needed, it can consist of removing the fibroid. Vaginal birth may not be possible after the removal of fibroids. This depends on where they are and the extent of surgery. When pregnancy occurs with fibroids it is usually normal.   Your caregiver can help decide which treatments are best for you.  HOME CARE INSTRUCTIONS   Do not use aspirin as this may increase bleeding problems.   If your periods (menses) are heavy, record the number of pads or tampons used per month. Bring this information to your caregiver. This can help them determine the best treatment for you.  SEEK IMMEDIATE MEDICAL CARE IF:  You have pelvic pain or cramps not controlled with medications, or experience a sudden increase in pain.   You have an increase of pelvic bleeding between and during menses.   You feel lightheaded or have fainting spells.   You develop worsening belly (abdominal) pain.  Document Released: 05/17/2000 Document Revised: 05/09/2011 Document Reviewed: 01/07/2008 [facility] Patient Information 2012 [place],  [place].   Dysfunctional/Abnormal Uterine Bleeding Normally, menstrual periods begin between ages 60 to 61 in young women. A normal menstrual cycle/period may begin every 23 days up to 35 days and lasts from 1 to 7 days. Around 12 to 14 days before your menstrual period starts, ovulation (ovary produces an egg) occurs. When counting the time between menstrual periods, count from the first day of bleeding of the previous period to the first day of bleeding of the next period. Dysfunctional (abnormal) uterine bleeding is bleeding that is different from a normal menstrual period. Your periods may come earlier or later than usual. They may be lighter, have blood clots or be heavier. You may have bleeding between periods, or you may skip one period or more. You may have bleeding after sexual intercourse, bleeding after menopause, or no menstrual period. CAUSES   Pregnancy (normal, miscarriage, tubal).   IUDs (intrauterine device, birth control).   Birth control pills.   Hormone treatment.   Menopause.   Infection of the cervix.   Blood clotting problems.   Infection of the inside lining of the uterus.   Endometriosis, inside lining of the uterus growing in the pelvis and other female organs.   Adhesions (scar tissue) inside the uterus.   Obesity or severe weight loss.   Uterine polyps inside the uterus.   Cancer of the vagina, cervix, or uterus.   Ovarian cysts or polycystic ovary syndrome.   Medical problems (diabetes, thyroid disease).   Uterine fibroids (noncancerous tumor).   Problems with your female hormones.   Endometrial hyperplasia, very thick lining  and enlarged cells inside of the uterus.   Medicines that interfere with ovulation.   Radiation to the pelvis or abdomen.   Chemotherapy.  DIAGNOSIS   Your doctor will discuss the history of your menstrual periods, medicines you are taking, changes in your weight, stress in your life, and any medical problems you may  have.   Your doctor will do a physical and pelvic examination.   Your doctor may want to perform certain tests to make a diagnosis, such as:   Pap test.   Blood tests.   Cultures for infection.   CT scan.   Ultrasound.   Hysteroscopy.   Laparoscopy.   MRI.   Hysterosalpingography.   D and C.   Endometrial biopsy.  TREATMENT  Treatment will depend on the cause of the dysfunctional uterine bleeding (DUB). Treatment may include:  Observing your menstrual periods for a couple of months.   Prescribing medicines for medical problems, including:   Antibiotics.   Hormones.   Birth control pills.   Removing an IUD (intrauterine device, birth control).   Surgery:   D and C (scrape and remove tissue from inside the uterus).   Laparoscopy (examine inside the abdomen with a lighted tube).   Uterine ablation (destroy lining of the uterus with electrical current, laser, heat, or freezing).   Hysteroscopy (examine cervix and uterus with a lighted tube).   Hysterectomy (remove the uterus).  HOME CARE INSTRUCTIONS   If medicines were prescribed, take exactly as directed. Do not change or switch medicines without consulting your caregiver.   Long term heavy bleeding may result in iron deficiency. Your caregiver may have prescribed iron pills. They help replace the iron that your body lost from heavy bleeding. Take exactly as directed.   Do not take aspirin or medicines that contain aspirin one week before or during your menstrual period. Aspirin may make the bleeding worse.   If you need to change your sanitary pad or tampon more than once every 2 hours, stay in bed with your feet elevated and a cold pack on your lower abdomen. Rest as much as possible, until the bleeding stops or slows down.   Eat well-balanced meals. Eat foods high in iron. Examples are:   Leafy green vegetables.   Whole-grain breads and cereals.   Eggs.   Meat.   Liver.   Do not try to lose  weight until the abnormal bleeding has stopped and your blood iron level is back to normal. Do not lift more than ten pounds or do strenuous activities when you are bleeding.   For a couple of months, make note on your calendar, marking the start and ending of your period, and the type of bleeding (light, medium, heavy, spotting, clots or missed periods). This is for your caregiver to better evaluate your problem.  SEEK MEDICAL CARE IF:   You develop nausea (feeling sick to your stomach) and vomiting, dizziness, or diarrhea while you are taking your medicine.   You are getting lightheaded or weak.   You have any problems that may be related to the medicine you are taking.   You develop pain with your DUB.   You want to remove your IUD.   You want to stop or change your birth control pills or hormones.   You have any type of abnormal bleeding mentioned above.   You are over 66 years old and have not had a menstrual period yet.   You are 36 years old and  you are still having menstrual periods.   You have any of the symptoms mentioned above.   You develop a rash.  SEEK IMMEDIATE MEDICAL CARE IF:   An oral temperature above 102 F (38.9 C) develops.   You develop chills.   You are changing your sanitary pad or tampon more than once an hour.   You develop abdominal pain.   You pass out or faint.  Document Released: 05/17/2000 Document Revised: 05/09/2011 Document Reviewed: 04/18/2009 [facility] Patient Information 2012 [place], [place].  Uterine Artery Embolization for Fibroids Uterine fibroids are non-cancerous (benign) smooth muscle tumors of the uterus. When they become large, they may produce symptoms of pain and bleeding. Fibroids are sometimes individually removed during surgery or removed with the uterus (hysterectomy).  One non-surgical treatment used to shrink fibroids is called uterine artery embolization. A specialist (interventional radiologist) uses a thin plastic  hose(catheter) to inject material that blocks off the blood supply to the fibroid. In time, this causes the fibroid to shrink. PROCEDURE  Under local anesthetic (a medication that numbs part of the body) the radiologist makes a small cut in the groin. A catheter is then inserted into the main artery of the leg. Using fluoroscopy, your radiologist guides the catheter through the artery to the uterus. A series of images are taken while dye is injected. This is done to provide a road map of the blood supply to the uterus and fibroids. Tiny plastic spheres about the size of sand grains are then injected through the catheter. Metal coils may sometimes also be used to help block the artery. The particles lodge in tiny branches of the uterine artery that supplies blood to the fibroids. The procedure is repeated on the artery that supplies the other side of the uterus. The hospital stay is usually overnight. Normal activity can resume after about a week. Mild pain and cramping following the procedure is easily treated with medication and anti-inflammatory drugs. These usually last only a couple days.  RISKS AND COMPLICATIONS  Injury to the uterus from decreased blood supply may happen.   This could require removal of the uterus (hysterectomy).   Pain and bleeding can occur.   Infection and abscess (a cyst filled with pus).   A cyst filled with blood (hematoma).   Blood infection (septicemia).   Amenorrhea (no menstrual period).   Dying of tissue cells that cannot recover (necrosis) to the bladder or lips of the vagina.   Fistula (a connection between organs or from organ to the skin).   Blood clot in the lung (pulmonary embolus).   Rarely death.  EXPECTED OUTCOME An ultrasound or MRI is done in 6 months to make sure the fibroids have shrunk. The fibroids usually shrink to about half their original size. In most cases these effects are long lasting.   The uterus also shrinks but does not die. You  may not be able to get pregnant following this procedure.   It cannot be estimated what the effects of the procedure will be on menses. Usually there is less bleeding.   The procedure may cause premature menopause or loss of menstrual cycle.  HOME CARE INSTRUCTIONS   Follow your caregiver's advice regarding medications given to you, diet, activity and when to begin sexual activity.   See your caregiver for follow up care as directed.   Do not take aspirin it can cause bleeding. Only take over-the-counter or prescription medicines for pain, discomfort, or fever as directed by your caregiver.  Care for and change dressing as directed.  SEEK MEDICAL CARE IF:   You develop a temperature of 102 F (38.9 C) or higher.   There is redness, swelling and pain around the wound.   You have pus draining from the wound.   You develop a rash.  SEEK IMMEDIATE MEDICAL CARE IF:   You have bleeding from the wound.   You have difficulty breathing.   You develop chest pain.   You develop belly (abdominal) pain.   You develop leg pain.   You become dizzy and pass out.  Document Released: 08/05/2005 Document Revised: 05/09/2011 Document Reviewed: 07/02/2007 [facility] Patient Information 2012 [place], [place].

## 2012-02-05 NOTE — Progress Notes (Signed)
History:  36 y.o. [id number] here today for discussion of management of fibroids.  Reports heavy bleeding x 2 months and worsening pain.  She was seen in the MAU and an ultrasound showed:  01/03/12 TRANSABDOMINAL AND TRANSVAGINAL ULTRASOUND OF PELVIS Clinical Data: Left lower quadrant pain and abnormal vaginal bleeding. LMP 12/16/2011. Comparison: Report from 2004  Findings: Uterus: The uterus is enlarged with a sagittal length of 12.1 cm, depth of 5.9 cm and width of 5.0 cm. Focal fibroids are identified and best visualized on the transabdominal exam. These have sizes and locations as follows: In the right fundal region measuring 2.8 x 2.9 x 3.2 cm, in the anterior right midbody measuring 2.5 x 2.3 x 2.8 cm, in the anterior left lower uterine segment measuring 2.0 x 1.9 x 2.5 cm, in the left fundal region measuring 2.9 x 2.8 x 3.1 cm and in the anterior midbody measuring 1.0 x 0.6 x 0.9 cm. All of the aforementioned fibroids appear to be mural in location. Endometrium: Appears echogenic with a width of 8.3 mm. Portions of the endometrial lining are obscured due to the fibroid load but no definite focal lesion is identified Right ovary: Has a normal appearance measuring 3.1 x 1.5 x 2.2 cm Left ovary: Has a normal appearance measuring 2.4 x 1.4 x 2.5 cm and is best seen transabdominally. Other findings: No pelvic fluid or separate adnexal masses are seen.  IMPRESSION: Fibroid uterus with fibroid sizes and locations as noted above and best evaluated transabdominally due to imaging parameters on endovaginal exam. No definite endometrial abnormality and normal ovaries.   The following portions of the patient's history were reviewed and updated as appropriate: allergies, current medications, past family history, past medical history, past social history, past surgical history and problem list. Had a pap smear at [facility] last year, also followed by them for her HTN.  Review of Systems:  A comprehensive  review of systems was negative.  Objective:  Physical Exam Blood pressure 141/100, pulse 87, temperature 98.6 F (37 C), temperature source Oral, resp. rate 12, height 5\' 1"  (1.549 m), weight 281 lb 12.8 oz (127.824 kg), last menstrual period 02/02/2012. Deferred  Assessment & Plan:  Discussed AUB, strongly recommended endometrial biopsy. Patient declines for now.  Discussed various management options for fibroids. Patient wants to avoid surgery for now.  She was told to use Diclofenac prn pain, Provera prn bleeding. Pain and bleeding precautions reviewed.  Return in one month for follow up.

## 2012-08-23 ENCOUNTER — Emergency Department (HOSPITAL_COMMUNITY)
Admission: EM | Admit: 2012-08-23 | Discharge: 2012-08-23 | Disposition: A | Discharge status: Home / Self Care | Payer: Medicaid Other | Attending: Emergency Medicine | Admitting: Emergency Medicine

## 2012-08-23 ENCOUNTER — Encounter (HOSPITAL_COMMUNITY): Payer: Self-pay | Admitting: *Deleted

## 2012-08-23 DIAGNOSIS — L0390 Cellulitis, unspecified: Secondary | ICD-10-CM

## 2012-08-23 DIAGNOSIS — I10 Essential (primary) hypertension: Secondary | ICD-10-CM | POA: Insufficient documentation

## 2012-08-23 DIAGNOSIS — IMO0002 Reserved for concepts with insufficient information to code with codable children: Secondary | ICD-10-CM | POA: Insufficient documentation

## 2012-08-23 DIAGNOSIS — H60399 Other infective otitis externa, unspecified ear: Secondary | ICD-10-CM | POA: Insufficient documentation

## 2012-08-23 DIAGNOSIS — Z79899 Other long term (current) drug therapy: Secondary | ICD-10-CM | POA: Insufficient documentation

## 2012-08-23 DIAGNOSIS — R011 Cardiac murmur, unspecified: Secondary | ICD-10-CM | POA: Insufficient documentation

## 2012-08-23 DIAGNOSIS — J45909 Unspecified asthma, uncomplicated: Secondary | ICD-10-CM | POA: Insufficient documentation

## 2012-08-23 DIAGNOSIS — Z8742 Personal history of other diseases of the female genital tract: Secondary | ICD-10-CM | POA: Insufficient documentation

## 2012-08-23 DIAGNOSIS — Z8751 Personal history of pre-term labor: Secondary | ICD-10-CM | POA: Insufficient documentation

## 2012-08-23 DIAGNOSIS — R1110 Vomiting, unspecified: Secondary | ICD-10-CM | POA: Insufficient documentation

## 2012-08-23 DIAGNOSIS — Z8659 Personal history of other mental and behavioral disorders: Secondary | ICD-10-CM | POA: Insufficient documentation

## 2012-08-23 MED ORDER — PREDNISONE 20 MG PO TABS: ORAL_TABLET | ORAL | Status: DC

## 2012-08-23 MED ORDER — SULFAMETHOXAZOLE-TRIMETHOPRIM 800-160 MG PO TABS: 1.0000 | ORAL_TABLET | Freq: Two times a day (BID) | ORAL | Status: DC

## 2012-08-23 NOTE — ED Notes (Signed)
Pt reports waking up this morning with swelling and itchiness to right ear lobe/ear and right side of face. Thinks she was bit by insect - reports getting bit by insect last week on right hip, developed similar symptoms. Did not seek medical treatment for previous bite

## 2012-08-23 NOTE — ED Provider Notes (Signed)
History     CSN: [id number]  Arrival date & time 08/23/12  4098   First MD Initiated Contact with Patient 08/23/12 [phone]      Chief Complaint  Patient presents with  . Facial Swelling    (Consider location/radiation/quality/duration/timing/severity/associated sxs/prior treatment) HPI Comments: Patient presents with swelling to the right ureter right-sided face. She states it started this morning is gotten worse since then. She says she was vomiting she went to bed last night. She thinks that she might have been bitten by something but she's not sure. She had a sore on her right hip last week with some redness but minimally on its own. She states that her ear is itchy but it's also painful and painful around the side of her face. She denies any other injuries. She denies any fevers or vomiting. She has constant throbbing pain to the area. She has not taken anything for the pain.   Past Medical History  Diagnosis Date  . Asthma   . Preterm labor   . Headache   . Heart murmur   . Hypertension     on meds  . Depression     hx, doing ok now  . Ovarian cyst     Past Surgical History  Procedure Laterality Date  . Tubal ligation      Family History  Problem Relation Age of Onset  . Hypertension Mother   . Cancer Son   . Other Neg Hx     History  Substance Use Topics  . Smoking status: Never Smoker   . Smokeless tobacco: Never Used  . Alcohol Use: No    OB History   Grav Para Term Preterm Abortions TAB SAB Ect Mult Living   7 5 3 2 2  0 2 0 0 5      Review of Systems  Constitutional: Negative for fever and fatigue.  HENT: Positive for facial swelling. Negative for drooling, neck pain, neck stiffness and dental problem.   Respiratory: Negative for shortness of breath.   Gastrointestinal: Negative for nausea and diarrhea.  Skin: Positive for rash. Negative for wound.  Neurological: Negative for headaches.  Hematological: Negative for adenopathy.    Allergies   Doxycycline and Flagyl  Home Medications   Current Outpatient Rx  Name  Route  Sig  Dispense  Refill  . acetaminophen (TYLENOL) 500 MG tablet   Oral   Take 1,000 mg by mouth every 6 (six) hours as needed. For pain         . albuterol (PROVENTIL HFA;VENTOLIN HFA) 108 (90 BASE) MCG/ACT inhaler   Inhalation   Inhale 2 puffs into the lungs every 6 (six) hours as needed. For shortness of breath         . diclofenac (VOLTAREN) 75 MG EC tablet   Oral   Take 1 tablet (75 mg total) by mouth 2 (two) times daily with a meal.   60 tablet   2   . loratadine (CLARITIN) 10 MG tablet   Oral   Take 10 mg by mouth daily.         . medroxyPROGESTERone (PROVERA) 10 MG tablet   Oral   Take 2 tablets (20 mg total) by mouth daily.   30 tablet   2   . predniSONE (DELTASONE) 20 MG tablet      3 tabs po day one, then 2 po daily x 4 days   11 tablet   0   . sulfamethoxazole-trimethoprim (SEPTRA DS) 800-160 MG  per tablet   Oral   Take 1 tablet by mouth every 12 (twelve) hours.   20 tablet   0   . triamterene-hydrochlorothiazide (DYAZIDE) 37.5-25 MG per capsule   Oral   Take 1 capsule by mouth every morning.             BP 152/80  Pulse 81  Temp(Src) 98.6 F (37 C) (Oral)  Resp 21  SpO2 99%  LMP 08/20/2012  Physical Exam  Constitutional: She appears well-developed and well-nourished.  HENT:  Head: Normocephalic and atraumatic.  Mouth/Throat: Oropharynx is clear and moist.  Patient has swelling to the right earlobe with redness to the area. There's no wounds noted. No induration or fluctuance is noted. There's also some erythema and swelling around the ear and down toward the mandible. There's no trismus. There is no elevation of the pinna  Eyes: Pupils are equal, round, and reactive to light.  Neck: Normal range of motion. Neck supple.  Lymphadenopathy:    She has no cervical adenopathy.    ED Course  Procedures (including critical care time)  Labs Reviewed - No  data to display No results found.   1. Cellulitis       MDM  Patient is a swelling of the right earlobe and adjacent face. She felt that she might of gotten an insect bite however with the description of the sore in her hip recently and the soreness to the face with no evident wounds will go ahead and treat her as a cellulitis however I did give her prescription for prednisone as well. Advised to return if her symptoms worsen including increased redness or facial swelling.        Rolan Bucco, MD 08/23/12 1007

## 2013-01-11 ENCOUNTER — Ambulatory Visit (HOSPITAL_BASED_OUTPATIENT_CLINIC_OR_DEPARTMENT_OTHER): Payer: Medicaid Other | Attending: Internal Medicine

## 2013-01-11 ENCOUNTER — Telehealth (HOSPITAL_BASED_OUTPATIENT_CLINIC_OR_DEPARTMENT_OTHER): Payer: Self-pay | Admitting: Radiology

## 2013-01-23 ENCOUNTER — Emergency Department (HOSPITAL_COMMUNITY)
Admission: EM | Admit: 2013-01-23 | Discharge: 2013-01-23 | Disposition: A | Discharge status: Home / Self Care | Payer: Medicaid Other | Source: Home / Self Care

## 2013-01-23 ENCOUNTER — Encounter (HOSPITAL_COMMUNITY): Payer: Self-pay | Admitting: *Deleted

## 2013-01-23 DIAGNOSIS — J329 Chronic sinusitis, unspecified: Secondary | ICD-10-CM

## 2013-01-23 MED ORDER — AMOXICILLIN-POT CLAVULANATE 875-125 MG PO TABS: 1.0000 | ORAL_TABLET | Freq: Two times a day (BID) | ORAL | Status: DC

## 2013-01-23 NOTE — ED Notes (Signed)
Pt   Reports  Headache         Sinus  Congestion            Decreased         Hearing        Coughing  As  Well     Symptoms  X  3  Days       Stuffy  Nose      Denys  Any  sorethroat           Pt  Reports  otc  meds  Not  Helping

## 2013-01-23 NOTE — ED Provider Notes (Signed)
CSN: [id number]     Arrival date & time 01/23/13  1202 History     First MD Initiated Contact with Patient 01/23/13 1313     Chief Complaint  Patient presents with  . Cough   (Consider location/radiation/quality/duration/timing/severity/associated sxs/prior Treatment) Patient is a 37 y.o. female presenting with cough. The history is provided by the patient. No language interpreter was used.  Cough Cough characteristics:  Non-productive Severity:  Moderate Onset quality:  Gradual Duration:  3 days Timing:  Constant Progression:  Worsening Chronicity:  New Relieved by:  Nothing Associated symptoms: fever   Associated symptoms: no chest pain and no rhinorrhea     Past Medical History  Diagnosis Date  . Asthma   . Preterm labor   . Headache(784.0)   . Heart murmur   . Hypertension     on meds  . Depression     hx, doing ok now  . Ovarian cyst    Past Surgical History  Procedure Laterality Date  . Tubal ligation     Family History  Problem Relation Age of Onset  . Hypertension Mother   . Cancer Son   . Other Neg Hx    History  Substance Use Topics  . Smoking status: Never Smoker   . Smokeless tobacco: Never Used  . Alcohol Use: No   OB History   Grav Para Term Preterm Abortions TAB SAB Ect Mult Living   7 5 3 2 2  0 2 0 0 5     Review of Systems  Constitutional: Positive for fever.  HENT: Negative for rhinorrhea.   Respiratory: Positive for cough.   Cardiovascular: Negative for chest pain.  All other systems reviewed and are negative.  Pt complains of a cough and sinus drainage.  Pt reports drainage from right eye and sinus pressure  Allergies  Doxycycline and Flagyl  Home Medications   Current Outpatient Rx  Name  Route  Sig  Dispense  Refill  . Omeprazole (PRILOSEC PO)   Oral   Take by mouth.         Marland Kitchen acetaminophen (TYLENOL) 500 MG tablet   Oral   Take 1,000 mg by mouth every 6 (six) hours as needed. For pain         . albuterol  (PROVENTIL HFA;VENTOLIN HFA) 108 (90 BASE) MCG/ACT inhaler   Inhalation   Inhale 2 puffs into the lungs every 6 (six) hours as needed. For shortness of breath         . diclofenac (VOLTAREN) 75 MG EC tablet   Oral   Take 1 tablet (75 mg total) by mouth 2 (two) times daily with a meal.   60 tablet   2   . loratadine (CLARITIN) 10 MG tablet   Oral   Take 10 mg by mouth daily.         . medroxyPROGESTERone (PROVERA) 10 MG tablet   Oral   Take 2 tablets (20 mg total) by mouth daily.   30 tablet   2   . predniSONE (DELTASONE) 20 MG tablet      3 tabs po day one, then 2 po daily x 4 days   11 tablet   0   . sulfamethoxazole-trimethoprim (SEPTRA DS) 800-160 MG per tablet   Oral   Take 1 tablet by mouth every 12 (twelve) hours.   20 tablet   0   . triamterene-hydrochlorothiazide (DYAZIDE) 37.5-25 MG per capsule   Oral   Take 1 capsule by mouth  every morning.            BP 143/117  Pulse 94  Temp(Src) 100 F (37.8 C) (Oral)  Resp 19  SpO2 96%  LMP 01/21/2013 Physical Exam  Constitutional: She is oriented to person, place, and time. She appears well-developed and well-nourished.  HENT:  Head: Normocephalic.  Eyes: Pupils are equal, round, and reactive to light.  Neck: Normal range of motion.  Cardiovascular: Normal rate.   Pulmonary/Chest: Effort normal and breath sounds normal.  Musculoskeletal: Normal range of motion.  Neurological: She is alert and oriented to person, place, and time. She has normal reflexes.  Skin: Skin is warm.  Psychiatric: She has a normal mood and affect.    ED Course   Procedures (including critical care time)  Labs Reviewed - No data to display No results found. 1. Sinusitis     MDM  Augmentin  Elson Areas, PA-C 01/23/13 1326

## 2013-01-23 NOTE — ED Provider Notes (Signed)
Medical screening examination/treatment/procedure(s) were performed by a resident physician or non-physician practitioner and as the supervising physician I was immediately available for consultation/collaboration.  Clementeen Graham, MD   Rodolph Bong, MD 01/23/13 [phone]

## 2013-03-02 ENCOUNTER — Inpatient Hospital Stay (HOSPITAL_COMMUNITY)
Admission: AD | Admit: 2013-03-02 | Discharge: 2013-03-02 | Disposition: A | Discharge status: Home / Self Care | Payer: Medicaid Other | Source: Ambulatory Visit | Attending: Obstetrics and Gynecology | Admitting: Obstetrics and Gynecology

## 2013-03-02 ENCOUNTER — Encounter (HOSPITAL_COMMUNITY): Payer: Self-pay | Admitting: *Deleted

## 2013-03-02 ENCOUNTER — Inpatient Hospital Stay (HOSPITAL_COMMUNITY): Payer: Medicaid Other

## 2013-03-02 DIAGNOSIS — D259 Leiomyoma of uterus, unspecified: Secondary | ICD-10-CM | POA: Insufficient documentation

## 2013-03-02 DIAGNOSIS — R1032 Left lower quadrant pain: Secondary | ICD-10-CM | POA: Insufficient documentation

## 2013-03-02 DIAGNOSIS — N838 Other noninflammatory disorders of ovary, fallopian tube and broad ligament: Secondary | ICD-10-CM | POA: Insufficient documentation

## 2013-03-02 DIAGNOSIS — N949 Unspecified condition associated with female genital organs and menstrual cycle: Secondary | ICD-10-CM | POA: Insufficient documentation

## 2013-03-02 DIAGNOSIS — N83209 Unspecified ovarian cyst, unspecified side: Secondary | ICD-10-CM

## 2013-03-02 LAB — URINE MICROSCOPIC-ADD ON

## 2013-03-02 LAB — URINALYSIS, ROUTINE W REFLEX MICROSCOPIC
Glucose, UA: NEGATIVE mg/dL
Protein, ur: NEGATIVE mg/dL
Specific Gravity, Urine: >1.03 — ABNORMAL HIGH (ref 1.005–1.030)

## 2013-03-02 LAB — WET PREP, GENITAL
Clue Cells Wet Prep HPF POC: NONE SEEN
Yeast Wet Prep HPF POC: NONE SEEN

## 2013-03-02 LAB — POCT PREGNANCY, URINE: Preg Test, Ur: NEGATIVE

## 2013-03-02 MED ORDER — KETOROLAC TROMETHAMINE 60 MG/2ML IM SOLN
60.0000 mg | INTRAMUSCULAR | Status: AC
  Administered 2013-03-02: 60 mg via INTRAMUSCULAR
  Filled 2013-03-02: qty 2

## 2013-03-02 MED ORDER — IBUPROFEN 600 MG PO TABS: 600.0000 mg | ORAL_TABLET | Freq: Four times a day (QID) | ORAL | Status: DC | PRN

## 2013-03-02 NOTE — MAU Provider Note (Signed)
Chief Complaint: No chief complaint on file.   First Provider Initiated Contact with Patient 03/02/13 1547     SUBJECTIVE HPI: Angela [name] is a 37 y.o. [id number] who presents to maternity admissions reporting left lower quadrant cramping pain, ongoing x several weeks but worsening today.  She has hx of uterine fibroids, diagnosed in 2013.  She was seen in MAU and WOC for these.  She also reports white vaginal discharge but denies itching, burning, or odor.  Pt would like to be evaluated for yeast infection because she was recently on abx for sinus infection.  She denies RLQ pain, vaginal bleeding, vaginal itching/burning, urinary symptoms, h/a, dizziness, n/v, or fever/chills.      Past Medical History  Diagnosis Date  . Asthma   . Preterm labor   . Headache(784.0)   . Heart murmur   . Hypertension     on meds  . Depression     hx, doing ok now  . Ovarian cyst    Past Surgical History  Procedure Laterality Date  . Tubal ligation     History   Social History  . Marital Status: Single    Spouse Name: N/A    Number of Children: N/A  . Years of Education: N/A   Occupational History  . Not on file.   Social History Main Topics  . Smoking status: Never Smoker   . Smokeless tobacco: Never Used  . Alcohol Use: No  . Drug Use: No  . Sexual Activity: Yes    Birth Control/ Protection: Condom, Surgical   Other Topics Concern  . Not on file   Social History Narrative  . No narrative on file   No current facility-administered medications on file prior to encounter.   No current outpatient prescriptions on file prior to encounter.   Allergies  Allergen Reactions  . Doxycycline Nausea And Vomiting  . Flagyl [Metronidazole Hcl] Hives and Nausea And Vomiting    ROS: Pertinent items in HPI  OBJECTIVE Blood pressure 146/92, pulse 87, temperature 98.2 F (36.8 C), temperature source Oral, resp. rate 20, height 5\' 1"  (1.549 m), weight 125.193 kg (276 lb), last menstrual  period 02/18/2013, SpO2 100.00%. GENERAL: Well-developed, well-nourished female in no acute distress.  HEENT: Normocephalic HEART: normal rate RESP: normal effort ABDOMEN: Soft, non-tender EXTREMITIES: Nontender, no edema NEURO: Alert and oriented Pelvic exam: Cervix pink, visually closed, without lesion, moderate amount yellow/white creamy discharge, vaginal walls and external genitalia normal Bimanual exam: Cervix 0/long/high, firm, anterior, neg CMT, unable to palpate uterus r/t body habitus, adnexa palpable and with mild tenderness on left  LAB RESULTS Results for orders placed during the hospital encounter of 03/02/13 (from the past 24 hour(s))  URINALYSIS, ROUTINE W REFLEX MICROSCOPIC     Status: Abnormal   Collection Time    03/02/13  3:25 PM      Result Value Range   Color, Urine YELLOW  YELLOW   APPearance HAZY (*) CLEAR   Specific Gravity, Urine >1.030 (*) 1.005 - 1.030   pH 5.5  5.0 - 8.0   Glucose, UA NEGATIVE  NEGATIVE mg/dL   Hgb urine dipstick NEGATIVE  NEGATIVE   Bilirubin Urine NEGATIVE  NEGATIVE   Ketones, ur 15 (*) NEGATIVE mg/dL   Protein, ur NEGATIVE  NEGATIVE mg/dL   Urobilinogen, UA 0.2  0.0 - 1.0 mg/dL   Nitrite NEGATIVE  NEGATIVE   Leukocytes, UA MODERATE (*) NEGATIVE  URINE MICROSCOPIC-ADD ON     Status: Abnormal  Collection Time    03/02/13  3:25 PM      Result Value Range   Squamous Epithelial / LPF FEW (*) RARE   WBC, UA 3-6  <3 WBC/hpf   Urine-Other AMORPHOUS URATES/PHOSPHATES    POCT PREGNANCY, URINE     Status: None   Collection Time    03/02/13  3:49 PM      Result Value Range   Preg Test, Ur NEGATIVE  NEGATIVE  WET PREP, GENITAL     Status: Abnormal   Collection Time    03/02/13  4:01 PM      Result Value Range   Yeast Wet Prep HPF POC NONE SEEN  NONE SEEN   Trich, Wet Prep NONE SEEN  NONE SEEN   Clue Cells Wet Prep HPF POC NONE SEEN  NONE SEEN   WBC, Wet Prep HPF POC MANY (*) NONE SEEN   IMAGING US Transvaginal Non-ob  03/02/2013    *RADIOLOGY REPORT*  Clinical Data:  Left lower quadrant pain.  Fibroids.  TRANSABDOMINAL AND TRANSVAGINAL ULTRASOUND OF PELVIS DOPPLER ULTRASOUND OF OVARIES  Technique:  Both transabdominal and transvaginal ultrasound examinations of the pelvis were performed. Transabdominal technique was performed for global imaging of the pelvis including uterus, ovaries, adnexal regions, and pelvic cul-de-sac.  It was necessary to proceed with endovaginal exam following the transabdominal exam to visualize the endometrium and ovaries.  Color and duplex Doppler ultrasound was utilized to evaluate blood flow to the ovaries.  Comparison:  Pelvic ultrasound 01/03/2012  FINDINGS  Uterus:  Demonstrates multiple fibroids.  There is a 4.3 x 3.2 x 3.8 cm fundal fibroid, a 3.9 x 3.3 x 3.5 cm fibroid in the posterior midbody, and a 3.2 x 3.1 x 3.2 cm fibroid within the anterior mid body.  Endometrium:  Unremarkable measuring 16 mm.  Right ovary: The right ovary measures 3.1 x 1.7 x 2.2 cm. There is a 0.5 x 0.4 x 0.5 cm echogenic focus within the right ovary. Otherwise unremarkable in appearance.  Left ovary: The left ovary is mildly enlarged and measures 4.2 x 2.8 x 3.4 cm. The left ovary also contains a 2.8 cm simple cyst.  Pulsed Doppler evaluation demonstrates normal low-resistance arterial and venous waveforms in both ovaries.  IMPRESSION:  Fibroid uterus.  Mildly enlarged left ovary which contains a simple cyst. Given the size of left ovary, intermittent torsion is not entirely excluded.  Small echogenic focus within the right ovary may represent a small dermoid.  Findings and impression discussed with Sharen Counter   Original Report Authenticated By: Annia Belt, M.D   US Pelvis Complete  03/02/2013   *RADIOLOGY REPORT*  Clinical Data:  Left lower quadrant pain.  Fibroids.  TRANSABDOMINAL AND TRANSVAGINAL ULTRASOUND OF PELVIS DOPPLER ULTRASOUND OF OVARIES  Technique:  Both transabdominal and transvaginal ultrasound  examinations of the pelvis were performed. Transabdominal technique was performed for global imaging of the pelvis including uterus, ovaries, adnexal regions, and pelvic cul-de-sac.  It was necessary to proceed with endovaginal exam following the transabdominal exam to visualize the endometrium and ovaries.  Color and duplex Doppler ultrasound was utilized to evaluate blood flow to the ovaries.  Comparison:  Pelvic ultrasound 01/03/2012  FINDINGS  Uterus:  Demonstrates multiple fibroids.  There is a 4.3 x 3.2 x 3.8 cm fundal fibroid, a 3.9 x 3.3 x 3.5 cm fibroid in the posterior midbody, and a 3.2 x 3.1 x 3.2 cm fibroid within the anterior mid body.  Endometrium:  Unremarkable measuring 16 mm.  Right ovary: The right ovary measures 3.1 x 1.7 x 2.2 cm. There is a 0.5 x 0.4 x 0.5 cm echogenic focus within the right ovary. Otherwise unremarkable in appearance.  Left ovary: The left ovary is mildly enlarged and measures 4.2 x 2.8 x 3.4 cm. The left ovary also contains a 2.8 cm simple cyst.  Pulsed Doppler evaluation demonstrates normal low-resistance arterial and venous waveforms in both ovaries.  IMPRESSION:  Fibroid uterus.  Mildly enlarged left ovary which contains a simple cyst. Given the size of left ovary, intermittent torsion is not entirely excluded.  Small echogenic focus within the right ovary may represent a small dermoid.  Findings and impression discussed with Sharen Counter   Original Report Authenticated By: Annia Belt, M.D   [place] Art/ven Flow Abd Pelv Doppler  03/02/2013   *RADIOLOGY REPORT*  Clinical Data:  Left lower quadrant pain.  Fibroids.  TRANSABDOMINAL AND TRANSVAGINAL ULTRASOUND OF PELVIS DOPPLER ULTRASOUND OF OVARIES  Technique:  Both transabdominal and transvaginal ultrasound examinations of the pelvis were performed. Transabdominal technique was performed for global imaging of the pelvis including uterus, ovaries, adnexal regions, and pelvic cul-de-sac.  It was necessary to proceed with  endovaginal exam following the transabdominal exam to visualize the endometrium and ovaries.  Color and duplex Doppler ultrasound was utilized to evaluate blood flow to the ovaries.  Comparison:  Pelvic ultrasound 01/03/2012  FINDINGS  Uterus:  Demonstrates multiple fibroids.  There is a 4.3 x 3.2 x 3.8 cm fundal fibroid, a 3.9 x 3.3 x 3.5 cm fibroid in the posterior midbody, and a 3.2 x 3.1 x 3.2 cm fibroid within the anterior mid body.  Endometrium:  Unremarkable measuring 16 mm.  Right ovary: The right ovary measures 3.1 x 1.7 x 2.2 cm. There is a 0.5 x 0.4 x 0.5 cm echogenic focus within the right ovary. Otherwise unremarkable in appearance.  Left ovary: The left ovary is mildly enlarged and measures 4.2 x 2.8 x 3.4 cm. The left ovary also contains a 2.8 cm simple cyst.  Pulsed Doppler evaluation demonstrates normal low-resistance arterial and venous waveforms in both ovaries.  IMPRESSION:  Fibroid uterus.  Mildly enlarged left ovary which contains a simple cyst. Given the size of left ovary, intermittent torsion is not entirely excluded.  Small echogenic focus within the right ovary may represent a small dermoid.  Findings and impression discussed with Sharen Counter   Original Report Authenticated By: Annia Belt, M.D   ASSESSMENT 1. Fibroid uterus   2. Simple ovarian cyst     PLAN Toradol 60 mg IM x1 dose in MAU with significant reduction of symptoms Discharge home, given precautions for torsion, pt to return if pain worsens or is accompanied by n/v Ibuprofen 600 mg PO Q 6 hours Urine sent for culture F/U with [facility] to manage HTN--pt reports she is out of HTN medication currently but has new prescription on the way Return to MAU as needed  Follow-up Information   Follow up with [facility]. Schedule an appointment as soon as possible for a visit in 1 month. (The gyn clinic will call you)    Specialty:  Obstetrics and Gynecology   Contact information:   [address] [place] [address] [phone]     Sharen Counter Certified Nurse-Midwife 03/02/2013  7:20 PM

## 2013-03-02 NOTE — MAU Note (Signed)
Patient presents to MAU with c/o sharp Lower left sided back and abdominal pain that has been "going on for a while". States has gotten progressively worse. Reports on and off spotting that is pink. Not having to wear a pad at this time. LMP 02/18/13. Reports white discharge without odor.

## 2013-03-03 LAB — URINE CULTURE: Colony Count: NO GROWTH

## 2013-03-03 NOTE — MAU Provider Note (Signed)
Attestation of Attending Supervision of Advanced Practitioner (CNM/NP): Evaluation and management procedures were performed by the Advanced Practitioner under my supervision and collaboration.  I have reviewed the Advanced Practitioner's note and chart, and I agree with the management and plan.  Trecia Maring 03/03/2013 8:36 AM

## 2013-06-08 ENCOUNTER — Emergency Department (HOSPITAL_COMMUNITY)
Admission: EM | Admit: 2013-06-08 | Discharge: 2013-06-08 | Disposition: A | Discharge status: Home / Self Care | Payer: Medicaid Other | Attending: Emergency Medicine | Admitting: Emergency Medicine

## 2013-06-08 ENCOUNTER — Encounter (HOSPITAL_COMMUNITY): Payer: Self-pay | Admitting: Emergency Medicine

## 2013-06-08 ENCOUNTER — Emergency Department (HOSPITAL_COMMUNITY): Payer: Medicaid Other

## 2013-06-08 DIAGNOSIS — R197 Diarrhea, unspecified: Secondary | ICD-10-CM | POA: Insufficient documentation

## 2013-06-08 DIAGNOSIS — R05 Cough: Secondary | ICD-10-CM

## 2013-06-08 DIAGNOSIS — I10 Essential (primary) hypertension: Secondary | ICD-10-CM | POA: Insufficient documentation

## 2013-06-08 DIAGNOSIS — R011 Cardiac murmur, unspecified: Secondary | ICD-10-CM | POA: Insufficient documentation

## 2013-06-08 DIAGNOSIS — Z8742 Personal history of other diseases of the female genital tract: Secondary | ICD-10-CM | POA: Insufficient documentation

## 2013-06-08 DIAGNOSIS — R059 Cough, unspecified: Secondary | ICD-10-CM

## 2013-06-08 DIAGNOSIS — Z79899 Other long term (current) drug therapy: Secondary | ICD-10-CM | POA: Insufficient documentation

## 2013-06-08 DIAGNOSIS — R1110 Vomiting, unspecified: Secondary | ICD-10-CM | POA: Insufficient documentation

## 2013-06-08 DIAGNOSIS — Z8751 Personal history of pre-term labor: Secondary | ICD-10-CM | POA: Insufficient documentation

## 2013-06-08 DIAGNOSIS — J45901 Unspecified asthma with (acute) exacerbation: Secondary | ICD-10-CM | POA: Insufficient documentation

## 2013-06-08 DIAGNOSIS — R51 Headache: Secondary | ICD-10-CM | POA: Insufficient documentation

## 2013-06-08 DIAGNOSIS — R32 Unspecified urinary incontinence: Secondary | ICD-10-CM | POA: Insufficient documentation

## 2013-06-08 DIAGNOSIS — R509 Fever, unspecified: Secondary | ICD-10-CM | POA: Insufficient documentation

## 2013-06-08 DIAGNOSIS — Z8659 Personal history of other mental and behavioral disorders: Secondary | ICD-10-CM | POA: Insufficient documentation

## 2013-06-08 DIAGNOSIS — J069 Acute upper respiratory infection, unspecified: Secondary | ICD-10-CM | POA: Insufficient documentation

## 2013-06-08 MED ORDER — DEXAMETHASONE SODIUM PHOSPHATE 10 MG/ML IJ SOLN
10.0000 mg | Freq: Once | INTRAMUSCULAR | Status: AC
  Administered 2013-06-08: 10 mg via INTRAMUSCULAR
  Filled 2013-06-08: qty 1

## 2013-06-08 MED ORDER — BENZONATATE 100 MG PO CAPS: 100.0000 mg | ORAL_CAPSULE | Freq: Three times a day (TID) | ORAL | Status: DC

## 2013-06-08 MED ORDER — TRIAMCINOLONE ACETONIDE 40 MG/ML IJ SUSP
40.0000 mg | Freq: Once | INTRAMUSCULAR | Status: AC
  Administered 2013-06-08: 40 mg via INTRAMUSCULAR
  Filled 2013-06-08: qty 1

## 2013-06-08 NOTE — ED Notes (Signed)
Pt coughing thick yellow mucus, sinus pressure, chest wall pain with cough. Denies sore throat

## 2013-06-08 NOTE — ED Provider Notes (Signed)
CSN: [id number]     Arrival date & time 06/08/13  1451 History  This chart was scribed for Angela George, PA working with [name], MD by [name], ED Scribe. This patient was seen in room [place] and the patient's care was started at 3:53 PM.   Chief Complaint  Patient presents with  . Headache  . Cough    productive cough x 2 weeks  . Facial Pain    nasal pressure    The history is provided by the patient. No language interpreter was used.    HPI Comments: Angela [name] is a 38 y.o. female with h/o asthma and HTN who presents to the Emergency Department complaining of 2 weeks of persistent unchanged productive cough with associated sinus pressure, headaches, congestion, diarrhea, fevers, chills, and wheezing.  Cough is productive of yellow sputum.  It is not worse at any particular time of day.  Pt states she has been coughing so hard that she has had some post-tussive emesis as well as some urinary incontinence during coughing spells (she has been pregnant 5 times with vaginal delivery of each).  Pt also reports some CP without radiation, only when coughing.  She states she has had 4-5 episodes of diarrhea.  She denies SOB or pain with swallowing.  She has been tolerating fluids well.  Pt has h/o recurrent sinus infections and began taking leftover amoxicillin 875mg  from a previous prescription when her symptoms began, without relief.  She has also been taking Delsym and Mucinex.  She has not attempted any other treatments pta.  Pt does not smoke.  She denies h/o DM.    Past Medical History  Diagnosis Date  . Asthma   . Preterm labor   . Headache(784.0)   . Heart murmur   . Hypertension     on meds  . Depression     hx, doing ok now  . Ovarian cyst     Past Surgical History  Procedure Laterality Date  . Tubal ligation      Family History  Problem Relation Age of Onset  . Hypertension Mother   . Cancer Son   . Other Neg Hx   . Hypertension Other    . Diabetes Other     History  Substance Use Topics  . Smoking status: Never Smoker   . Smokeless tobacco: Never Used  . Alcohol Use: No    OB History   Grav Para Term Preterm Abortions TAB SAB Ect Mult Living   7 5 3 2 2  0 2 0 0 5      Review of Systems  Respiratory: Negative for chest tightness and shortness of breath.   Cardiovascular: Negative for palpitations and leg swelling.  All other systems reviewed and are negative.     Allergies  Doxycycline and Flagyl  Home Medications   Current Outpatient Rx  Name  Route  Sig  Dispense  Refill  . acetaminophen (TYLENOL) 325 MG tablet   Oral   Take 650 mg by mouth every 6 (six) hours as needed (pain).         Marland Kitchen albuterol (PROVENTIL HFA;VENTOLIN HFA) 108 (90 BASE) MCG/ACT inhaler   Inhalation   Inhale 1-2 puffs into the lungs every 6 (six) hours as needed for wheezing or shortness of breath (wheezing).         Marland Kitchen dextromethorphan (DELSYM) 30 MG/5ML liquid   Oral   Take 60 mg by mouth as needed for cough (cough).         Marland Kitchen  pseudoephedrine-guaifenesin (MUCINEX D) 60-600 MG per tablet   Oral   Take 1 tablet by mouth every 12 (twelve) hours.         . benzonatate (TESSALON) 100 MG capsule   Oral   Take 1 capsule (100 mg total) by mouth every 8 (eight) hours.   21 capsule   0    BP 125/88  Pulse 104  Temp(Src) 98.6 F (37 C) (Oral)  Resp 20  SpO2 100%  LMP 05/12/2013  Physical Exam  Nursing note and vitals reviewed. Constitutional: She is oriented to person, place, and time. She appears well-developed and well-nourished. No distress.  HENT:  Head: Normocephalic and atraumatic.  Right Ear: Tympanic membrane and ear canal normal.  Left Ear: Tympanic membrane and ear canal normal.  Nose: Mucosal edema (mild) present. No rhinorrhea.  Mouth/Throat: Uvula is midline and mucous membranes are normal. Posterior oropharyngeal erythema (mild) present. No oropharyngeal exudate or posterior oropharyngeal edema.   Tonsils normal, no swelling or exudate  Eyes: Conjunctivae and EOM are normal. Right eye exhibits no discharge. Left eye exhibits no discharge. No scleral icterus.  Neck: Normal range of motion. Neck supple. No tracheal deviation present.  Cardiovascular: Normal rate, regular rhythm and normal heart sounds.   No murmur heard. Pulmonary/Chest: Effort normal and breath sounds normal. No respiratory distress. She has no wheezes. She has no rales.  Musculoskeletal: Normal range of motion. She exhibits no edema.  Lymphadenopathy:    She has no cervical adenopathy.  Neurological: She is alert and oriented to person, place, and time.  Skin: Skin is warm and dry.  Psychiatric: She has a normal mood and affect. Her behavior is normal.    ED Course  Procedures (including critical care time)  DIAGNOSTIC STUDIES: Oxygen Saturation is 100% on room air, normal by my interpretation.    COORDINATION OF CARE: 4:06 PM-Informed pt that symptoms are likely due to viral infection and will resolve on their own.  Discussed treatment plan which includes CXR to rule out other causes, and symptomatic relief if negative.  Pt expressed understanding and agreed with plan.   Labs Review Labs Reviewed - No data to display   Imaging Review Dg Chest 2 View  06/08/2013   CLINICAL DATA:  Cough  EXAM: CHEST  2 VIEW  COMPARISON:  07/16/2009  FINDINGS: The heart size and mediastinal contours are within normal limits. Both lungs are clear. The visualized skeletal structures are unremarkable.  IMPRESSION: No active cardiopulmonary disease.   Electronically Signed   By: Inez Catalina M.D.   On: 06/08/2013 16:19    EKG Interpretation   None       MDM   1. URI (upper respiratory infection)   2. Cough    Patient afebrile with normal VS prior to discharge.  Follow up with PCP in 2 days. Drink plenty of fluids. Recommend nasal saline irrigation, warm tea with honey for cough. Take medications as directed. Return to  ED should you develop progressive chest pain or shortness of breath with associated fever/chills. Patient agrees with plan.  Meds given in ED:  Medications  triamcinolone acetonide (KENALOG-40) injection 40 mg (40 mg Intramuscular Given 06/08/13 1629)  dexamethasone (DECADRON) injection 10 mg (10 mg Intramuscular Given 06/08/13 1629)    Discharge Medication List as of 06/08/2013  4:17 PM    START taking these medications   Details  benzonatate (TESSALON) 100 MG capsule Take 1 capsule (100 mg total) by mouth every 8 (eight) hours., Starting 06/08/2013,  Until Discontinued, Print           I personally performed the services described in this documentation, which was scribed in my presence. The recorded information has been reviewed and is accurate.    Dossie Arbour Ryder, PA-C 06/09/13 [phone]

## 2013-06-08 NOTE — Discharge Instructions (Signed)
Follow up with PCP in 2 days. Drink plenty of fluids. Avoid caffeine. Recommend nasal saline irrigation, warm tea with honey for cough. Take medications as directed.

## 2013-06-09 NOTE — ED Provider Notes (Signed)
Medical screening examination/treatment/procedure(s) were performed by non-physician practitioner and as supervising physician I was immediately available for consultation/collaboration.  EKG Interpretation   None         [name], MD 06/09/13 1101

## 2013-07-28 IMAGING — CR DG FEMUR 2+V*R*
4 series · 4 of 4 positions shown · non-contrast
Comparison: None.

CLINICAL DATA: Right hip pain.

RIGHT FEMUR - 2 VIEW

[view not recorded (1 of 4)]
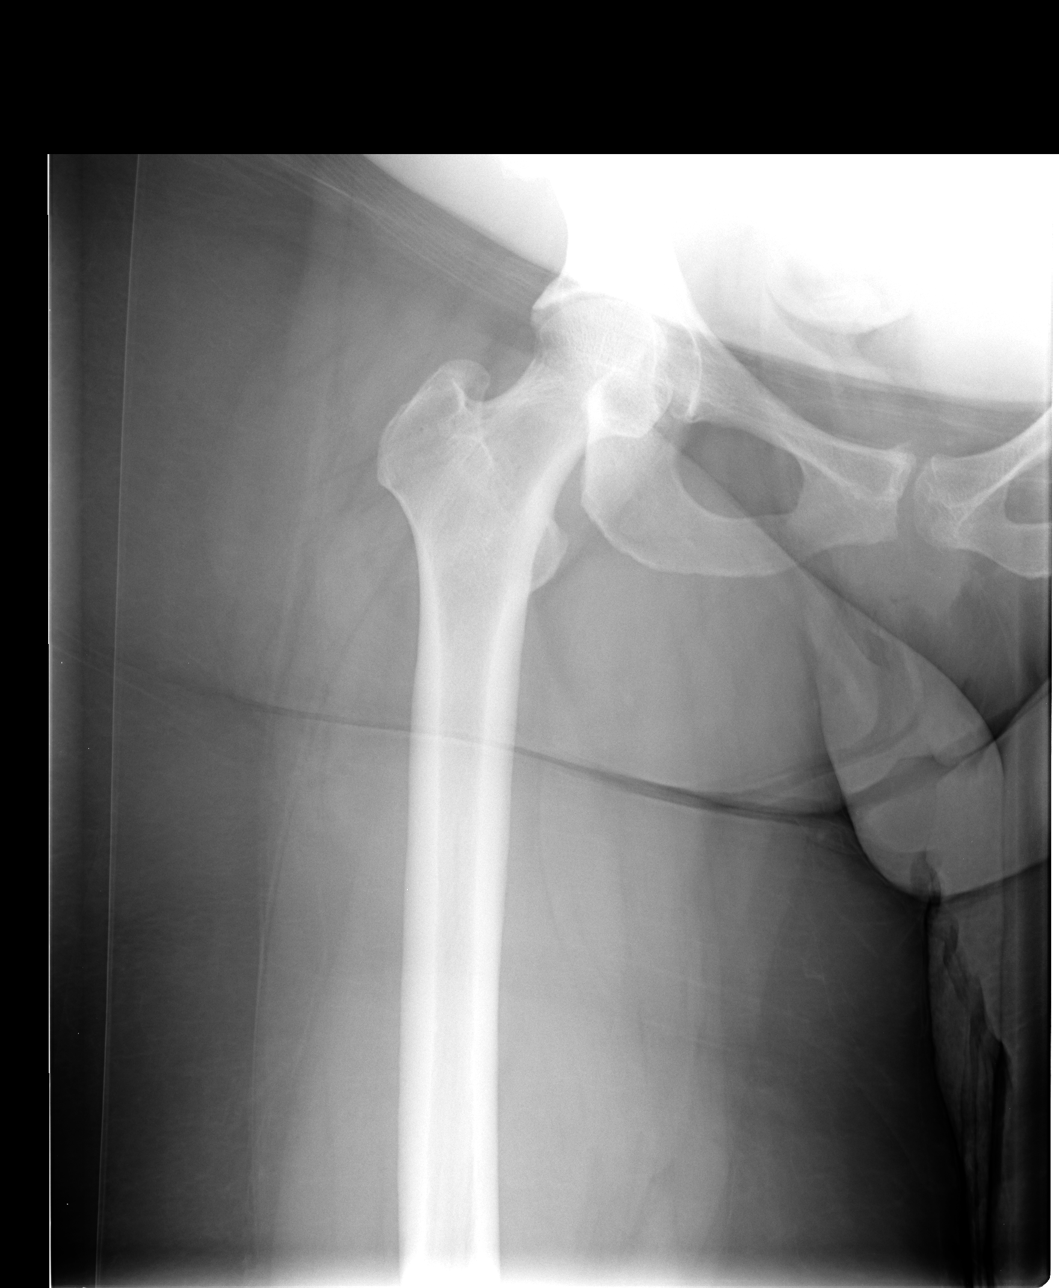

[view not recorded (2 of 4)]
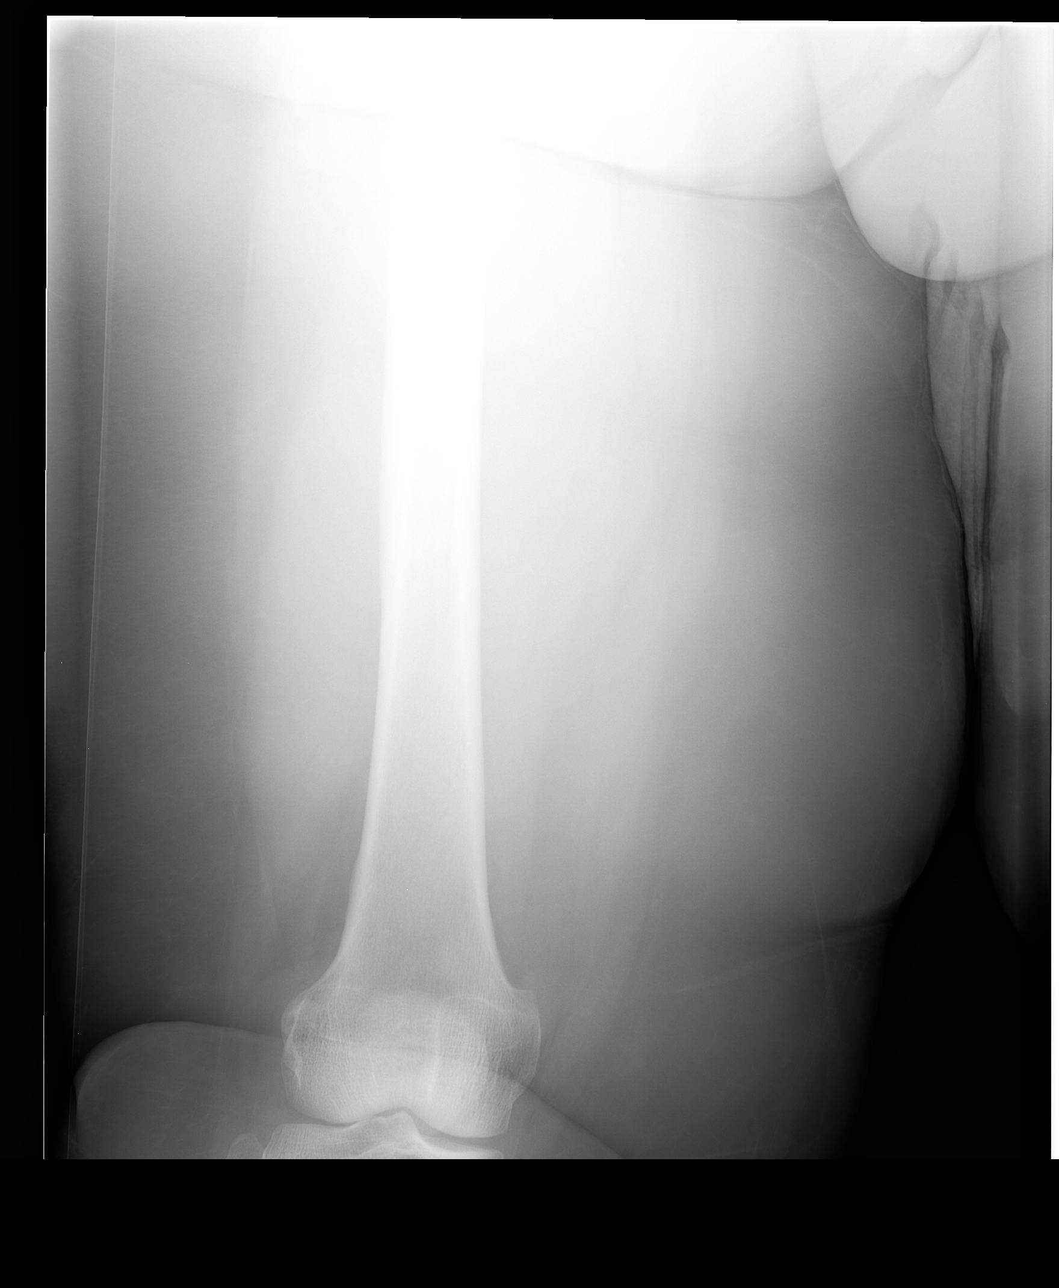

[view not recorded (3 of 4)]
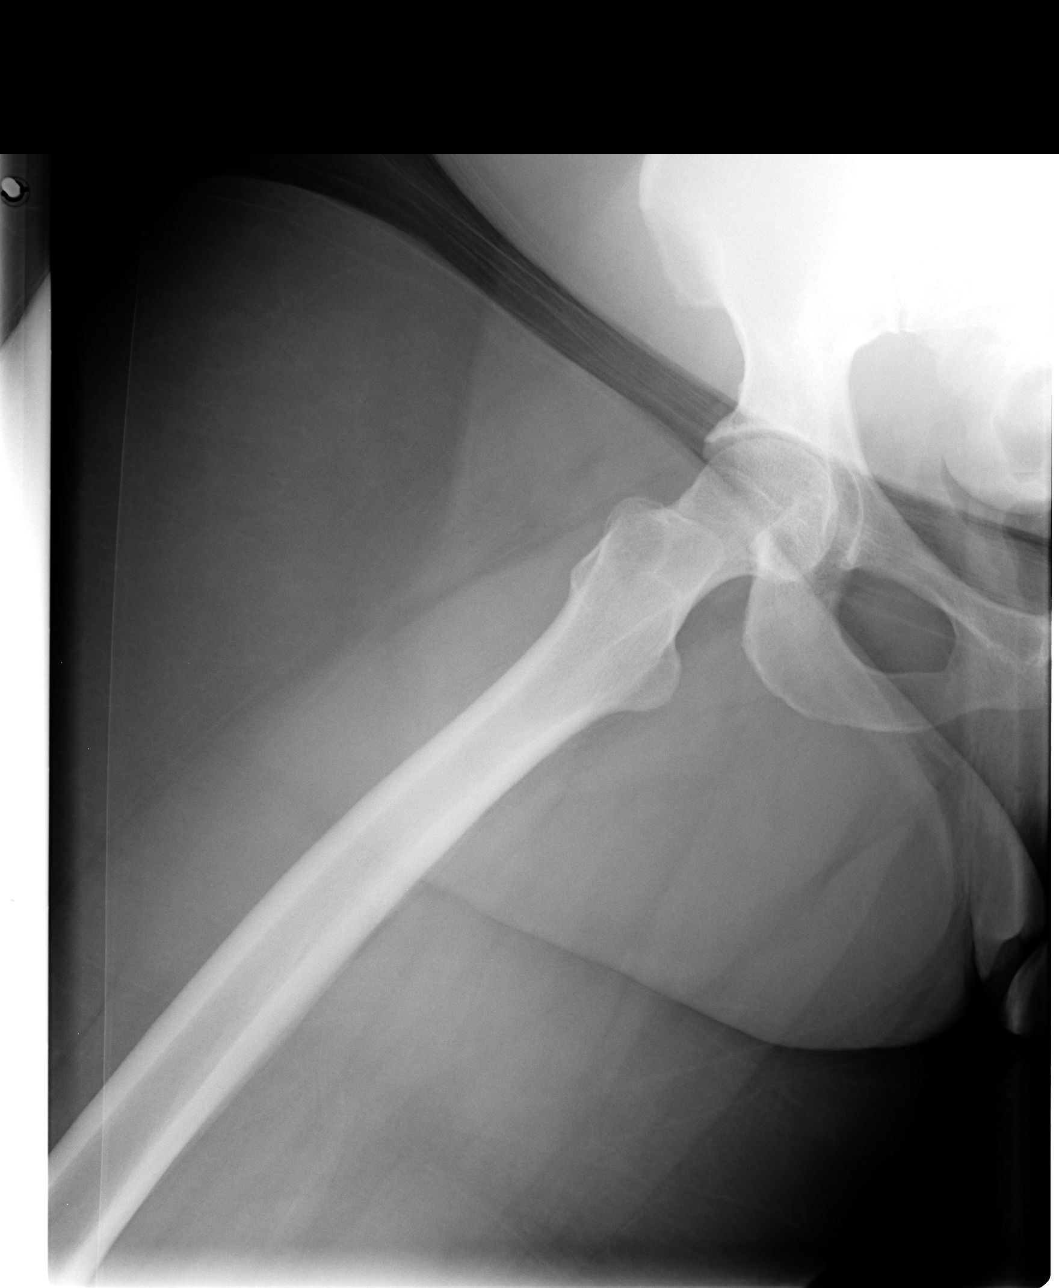

[view not recorded (4 of 4)]
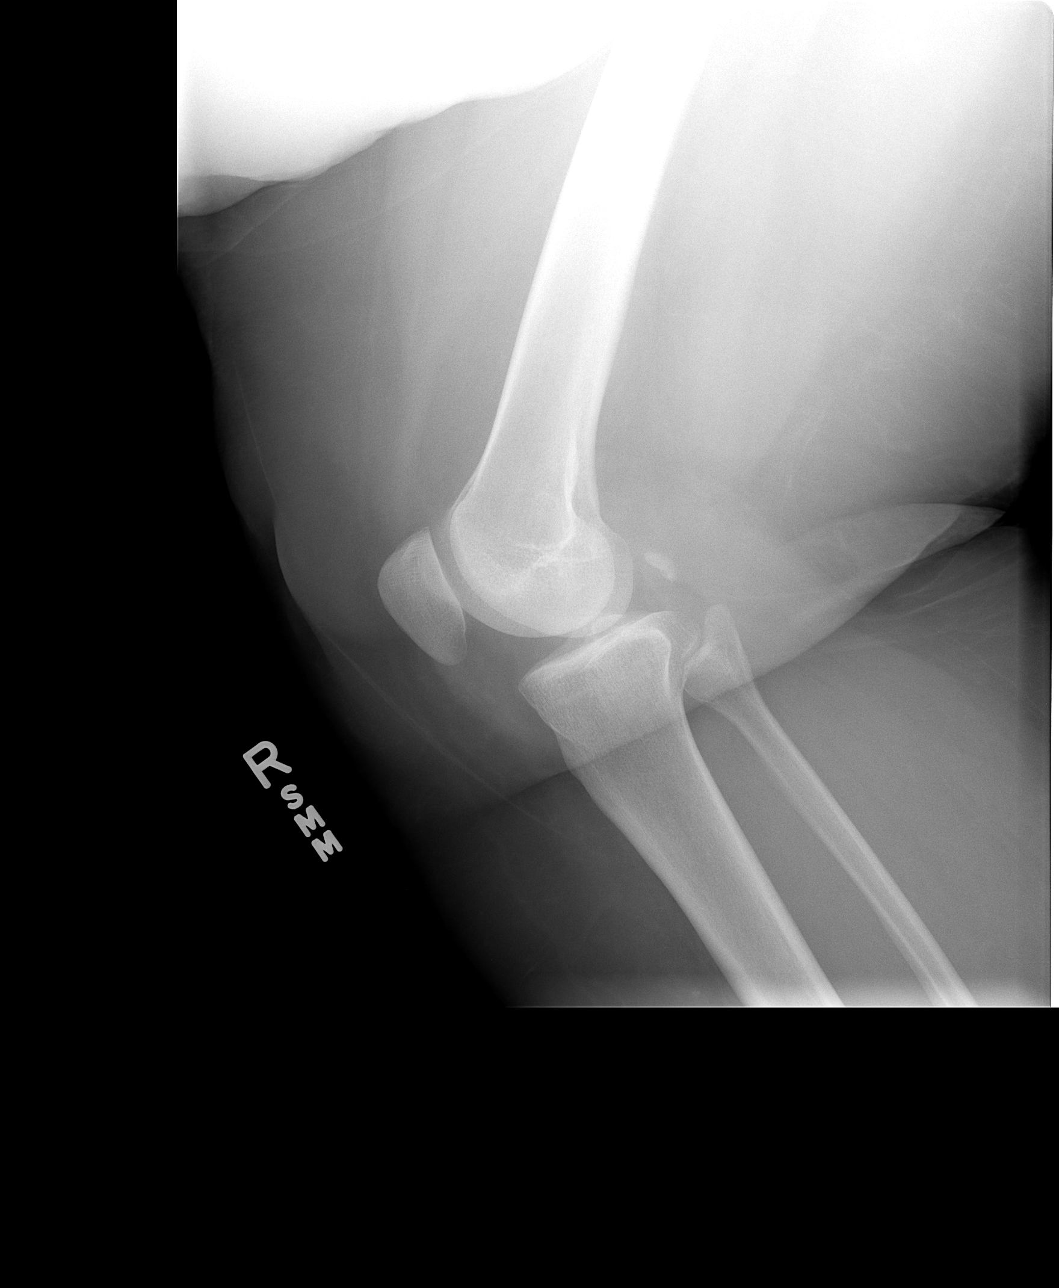

[4 of 4 positions shown; findings below may reference images not displayed]

FINDINGS: No acute bony abnormality.  Specifically, no fracture,
subluxation, or dislocation.  Soft tissues are intact.
IMPRESSION: No acute bony abnormality.

## 2013-08-10 ENCOUNTER — Encounter (HOSPITAL_COMMUNITY): Payer: Self-pay | Admitting: Emergency Medicine

## 2013-08-10 ENCOUNTER — Emergency Department (HOSPITAL_COMMUNITY)
Admission: EM | Admit: 2013-08-10 | Discharge: 2013-08-10 | Disposition: A | Discharge status: Home / Self Care | Payer: Medicaid Other | Source: Home / Self Care | Attending: Family Medicine | Admitting: Family Medicine

## 2013-08-10 ENCOUNTER — Emergency Department (INDEPENDENT_AMBULATORY_CARE_PROVIDER_SITE_OTHER): Payer: Medicaid Other

## 2013-08-10 DIAGNOSIS — J069 Acute upper respiratory infection, unspecified: Secondary | ICD-10-CM

## 2013-08-10 MED ORDER — IBUPROFEN 800 MG PO TABS
ORAL_TABLET | ORAL | Status: AC
  Filled 2013-08-10: qty 1

## 2013-08-10 MED ORDER — IBUPROFEN 800 MG PO TABS
800.0000 mg | ORAL_TABLET | Freq: Once | ORAL | Status: AC
  Administered 2013-08-10: 800 mg via ORAL

## 2013-08-10 NOTE — ED Provider Notes (Signed)
CSN: [id number]     Arrival date & time 08/10/13  1603 History   First MD Initiated Contact with Patient 08/10/13 1804     Chief Complaint  Patient presents with  . Flank Pain   (Consider location/radiation/quality/duration/timing/severity/associated sxs/prior Treatment) HPI Comments: Patient reports a 4-5 day history of productive cough, hoarse voice, scratchy throat with discomfort at right lateral lower chest wall with coughing over past 24 hours. Denies fever or dyspnea. No GI or GU issues. No leg pain, calf pain or lower extremity injury or swelling.  Patient is non-smoker.  PCP: [facility]  The history is provided by the patient.    Past Medical History  Diagnosis Date  . Asthma   . Preterm labor   . Headache(784.0)   . Heart murmur   . Hypertension     on meds  . Depression     hx, doing ok now  . Ovarian cyst    Past Surgical History  Procedure Laterality Date  . Tubal ligation     Family History  Problem Relation Age of Onset  . Hypertension Mother   . Cancer Son   . Other Neg Hx   . Hypertension Other   . Diabetes Other    History  Substance Use Topics  . Smoking status: Never Smoker   . Smokeless tobacco: Never Used  . Alcohol Use: No   OB History   Grav Para Term Preterm Abortions TAB SAB Ect Mult Living   7 5 3 2 2  0 2 0 0 5     Review of Systems  All other systems reviewed and are negative.    Allergies  Doxycycline and Flagyl  Home Medications   Current Outpatient Rx  Name  Route  Sig  Dispense  Refill  . acetaminophen (TYLENOL) 325 MG tablet   Oral   Take 650 mg by mouth every 6 (six) hours as needed (pain).         Marland Kitchen albuterol (PROVENTIL HFA;VENTOLIN HFA) 108 (90 BASE) MCG/ACT inhaler   Inhalation   Inhale 1-2 puffs into the lungs every 6 (six) hours as needed for wheezing or shortness of breath (wheezing).         Marland Kitchen loratadine (CLARITIN) 10 MG tablet   Oral   Take 10 mg by mouth daily.         Marland Kitchen  triamterene-hydrochlorothiazide (DYAZIDE) 37.5-25 MG per capsule   Oral   Take 1 capsule by mouth daily.         . benzonatate (TESSALON) 100 MG capsule   Oral   Take 1 capsule (100 mg total) by mouth every 8 (eight) hours.   21 capsule   0   . dextromethorphan (DELSYM) 30 MG/5ML liquid   Oral   Take 60 mg by mouth as needed for cough (cough).         . pseudoephedrine-guaifenesin (MUCINEX D) 60-600 MG per tablet   Oral   Take 1 tablet by mouth every 12 (twelve) hours.          BP 142/92  Pulse 84  Temp(Src) 98.8 F (37.1 C) (Oral)  Resp 20  SpO2 98%  LMP 08/06/2013 Physical Exam  Nursing note and vitals reviewed. Constitutional: She is oriented to person, place, and time. She appears well-developed and well-nourished. No distress.  Morbidly obese  HENT:  Head: Normocephalic and atraumatic.  Right Ear: Hearing, tympanic membrane, external ear and ear canal normal.  Left Ear: Hearing, tympanic membrane, external ear  and ear canal normal.  Nose: Nose normal.  Mouth/Throat: Uvula is midline, oropharynx is clear and moist and mucous membranes are normal.  Eyes: Conjunctivae are normal. Right eye exhibits no discharge. Left eye exhibits no discharge. No scleral icterus.  Neck: Normal range of motion. Neck supple.  Cardiovascular: Normal rate, regular rhythm and normal heart sounds.   Pulmonary/Chest: Effort normal and breath sounds normal. She exhibits tenderness. She exhibits no mass.    Outlined area on diagram is area of tenderness with palpation and cough.   Abdominal: Soft. Bowel sounds are normal. She exhibits no distension. There is no tenderness.  Musculoskeletal: Normal range of motion.  Lymphadenopathy:    She has no cervical adenopathy.  Neurological: She is alert and oriented to person, place, and time.  Skin: Skin is warm and dry. No rash noted. No erythema.  Psychiatric: She has a normal mood and affect. Her behavior is normal.    ED Course    Procedures (including critical care time) Labs Review Labs Reviewed - No data to display Imaging Review Dg Chest 2 View  08/10/2013   CLINICAL DATA Productive cough and right pain.  EXAM CHEST  2 VIEW  COMPARISON 06/08/2013  FINDINGS Two views of the chest were obtained. Lungs are clear without airspace disease or edema. Heart and mediastinum are within normal limits. Bony thorax is intact.  IMPRESSION No acute cardiopulmonary disease.  SIGNATURE  Electronically Signed   By: Markus Daft M.D.   On: 08/10/2013 18:40     MDM   1. URI (upper respiratory infection)    URI with right lower lateral chest wall myofacial strain: CXR without evidence of acute disease. Advise tylenol or ibuprofen as directed on packaging for discomfort. Delsym as directed on packaging for cough. Oral fluids and rest.    Annett Gula [place], [place] 08/10/13 [phone]

## 2013-08-10 NOTE — ED Notes (Signed)
Patient complains of productive cough and right flank pain with hoarseness of voice that started last night; states pain swelling and pain on right side as well.

## 2013-08-10 NOTE — Discharge Instructions (Signed)
Your chest xray was normal. Please drink plenty of fluids and rest as you need to. Tylenol and/or ibuprofen as directed on packaging for discomfort. Delsym as directed on packaging for cough. I would anticipate that you will begin to fell better over the next few days. If symptoms become suddenly worse or severe, please have yourself evaluated at your nearest Emergency Room.

## 2013-08-11 NOTE — ED Provider Notes (Signed)
Medical screening examination/treatment/procedure(s) were performed by a resident physician or non-physician practitioner and as the supervising physician I was immediately available for consultation/collaboration.  Srikar Chiang, MD    Emiley Digiacomo S Scotlyn Mccranie, MD 08/11/13 0803 

## 2013-09-21 ENCOUNTER — Emergency Department (HOSPITAL_COMMUNITY)
Admission: EM | Admit: 2013-09-21 | Discharge: 2013-09-21 | Disposition: A | Discharge status: Home / Self Care | Payer: Medicaid Other | Source: Home / Self Care

## 2013-09-21 ENCOUNTER — Encounter (HOSPITAL_COMMUNITY): Payer: Self-pay | Admitting: Emergency Medicine

## 2013-09-21 DIAGNOSIS — T733XXA Exhaustion due to excessive exertion, initial encounter: Secondary | ICD-10-CM

## 2013-09-21 DIAGNOSIS — T148XXA Other injury of unspecified body region, initial encounter: Secondary | ICD-10-CM

## 2013-09-21 DIAGNOSIS — N318 Other neuromuscular dysfunction of bladder: Secondary | ICD-10-CM

## 2013-09-21 DIAGNOSIS — Y9289 Other specified places as the place of occurrence of the external cause: Secondary | ICD-10-CM

## 2013-09-21 DIAGNOSIS — S29012A Strain of muscle and tendon of back wall of thorax, initial encounter: Secondary | ICD-10-CM

## 2013-09-21 DIAGNOSIS — X501XXA Overexertion from prolonged static or awkward postures, initial encounter: Secondary | ICD-10-CM

## 2013-09-21 DIAGNOSIS — N3281 Overactive bladder: Secondary | ICD-10-CM

## 2013-09-21 DIAGNOSIS — S239XXA Sprain of unspecified parts of thorax, initial encounter: Secondary | ICD-10-CM

## 2013-09-21 DIAGNOSIS — M546 Pain in thoracic spine: Secondary | ICD-10-CM

## 2013-09-21 LAB — POCT URINALYSIS DIP (DEVICE)
Bilirubin Urine: NEGATIVE
GLUCOSE, UA: NEGATIVE mg/dL
Hgb urine dipstick: NEGATIVE
Ketones, ur: NEGATIVE mg/dL
LEUKOCYTES UA: NEGATIVE
NITRITE: NEGATIVE
PROTEIN: NEGATIVE mg/dL
SPECIFIC GRAVITY, URINE: 1.025 (ref 1.005–1.030)
Urobilinogen, UA: 0.2 mg/dL (ref 0.0–1.0)
pH: 5.5 (ref 5.0–8.0)

## 2013-09-21 MED ORDER — HYDROCODONE-ACETAMINOPHEN 7.5-325 MG PO TABS: 1.0000 | ORAL_TABLET | ORAL | Status: DC | PRN

## 2013-09-21 MED ORDER — KETOROLAC TROMETHAMINE 60 MG/2ML IM SOLN
60.0000 mg | Freq: Once | INTRAMUSCULAR | Status: AC
  Administered 2013-09-21: 60 mg via INTRAMUSCULAR

## 2013-09-21 MED ORDER — DICLOFENAC POTASSIUM 50 MG PO TABS: 50.0000 mg | ORAL_TABLET | Freq: Three times a day (TID) | ORAL | Status: DC

## 2013-09-21 MED ORDER — CYCLOBENZAPRINE HCL 5 MG PO TABS: 5.0000 mg | ORAL_TABLET | Freq: Three times a day (TID) | ORAL | Status: DC | PRN

## 2013-09-21 MED ORDER — KETOROLAC TROMETHAMINE 60 MG/2ML IM SOLN
INTRAMUSCULAR | Status: AC
  Filled 2013-09-21: qty 2

## 2013-09-21 NOTE — Discharge Instructions (Signed)
Back Pain, Adult Low back pain is very common. About 1 in 5 people have back pain.The cause of low back pain is rarely dangerous. The pain often gets better over time.About half of people with a sudden onset of back pain feel better in just 2 weeks. About 8 in 10 people feel better by 6 weeks.  CAUSES Some common causes of back pain include:  Strain of the muscles or ligaments supporting the spine.  Wear and tear (degeneration) of the spinal discs.  Arthritis.  Direct injury to the back. DIAGNOSIS Most of the time, the direct cause of low back pain is not known.However, back pain can be treated effectively even when the exact cause of the pain is unknown.Answering your caregiver's questions about your overall health and symptoms is one of the most accurate ways to make sure the cause of your pain is not dangerous. If your caregiver needs more information, he or she may order lab work or imaging tests (X-rays or MRIs).However, even if imaging tests show changes in your back, this usually does not require surgery. HOME CARE INSTRUCTIONS For many people, back pain returns.Since low back pain is rarely dangerous, it is often a condition that people can learn to [facility] their own.   Remain active. It is stressful on the back to sit or stand in one place. Do not sit, drive, or stand in one place for more than 30 minutes at a time. Take short walks on level surfaces as soon as pain allows.Try to increase the length of time you walk each day.  Do not stay in bed.Resting more than 1 or 2 days can delay your recovery.  Do not avoid exercise or work.Your body is made to move.It is not dangerous to be active, even though your back may hurt.Your back will likely heal faster if you return to being active before your pain is gone.  Pay attention to your body when you bend and lift. Many people have less discomfortwhen lifting if they bend their knees, keep the load close to their bodies,and  avoid twisting. Often, the most comfortable positions are those that put less stress on your recovering back.  Find a comfortable position to sleep. Use a firm mattress and lie on your side with your knees slightly bent. If you lie on your back, put a pillow under your knees.  Only take over-the-counter or prescription medicines as directed by your caregiver. Over-the-counter medicines to reduce pain and inflammation are often the most helpful.Your caregiver may prescribe muscle relaxant drugs.These medicines help dull your pain so you can more quickly return to your normal activities and healthy exercise.  Put ice on the injured area.  Put ice in a plastic bag.  Place a towel between your skin and the bag.  Leave the ice on for 15-20 minutes, 03-04 times a day for the first 2 to 3 days. After that, ice and heat may be alternated to reduce pain and spasms.  Ask your caregiver about trying back exercises and gentle massage. This may be of some benefit.  Avoid feeling anxious or stressed.Stress increases muscle tension and can worsen back pain.It is important to recognize when you are anxious or stressed and learn ways to manage it.Exercise is a great option. SEEK MEDICAL CARE IF:  You have pain that is not relieved with rest or medicine.  You have pain that does not improve in 1 week.  You have new symptoms.  You are generally not feeling well. SEEK  IMMEDIATE MEDICAL CARE IF:   You have pain that radiates from your back into your legs.  You develop new bowel or bladder control problems.  You have unusual weakness or numbness in your arms or legs.  You develop nausea or vomiting.  You develop abdominal pain.  You feel faint. Document Released: 05/20/2005 Document Revised: 11/19/2011 Document Reviewed: 10/08/2010 [facility] Patient Information 2014 [place], [place].  Muscle Strain A muscle strain is an injury that occurs when a muscle is stretched beyond its normal length.  Usually a small number of muscle fibers are torn when this happens. Muscle strain is rated in degrees. First-degree strains have the least amount of muscle fiber tearing and pain. Second-degree and third-degree strains have increasingly more tearing and pain.  Usually, recovery from muscle strain takes 1 2 weeks. Complete healing takes 5 6 weeks.  CAUSES  Muscle strain happens when a sudden, violent force placed on a muscle stretches it too far. This may occur with lifting, sports, or a fall.  RISK FACTORS Muscle strain is especially common in athletes.  SIGNS AND SYMPTOMS At the site of the muscle strain, there may be:  Pain.  Bruising.  Swelling.  Difficulty using the muscle due to pain or lack of normal function. DIAGNOSIS  Your health care provider will perform a physical exam and ask about your medical history. TREATMENT  Often, the best treatment for a muscle strain is resting, icing, and applying cold compresses to the injured area.  HOME CARE INSTRUCTIONS   Use the PRICE method of treatment to promote muscle healing during the first 2 3 days after your injury. The PRICE method involves:  Protecting the muscle from being injured again.  Restricting your activity and resting the injured body part.  Icing your injury. To do this, put ice in a plastic bag. Place a towel between your skin and the bag. Then, apply the ice and leave it on from 15 20 minutes each hour. After the third day, switch to moist heat packs.  Apply compression to the injured area with a splint or elastic bandage. Be careful not to wrap it too tightly. This may interfere with blood circulation or increase swelling.  Elevate the injured body part above the level of your heart as often as you can.  Only take over-the-counter or prescription medicines for pain, discomfort, or fever as directed by your health care provider.  Warming up prior to exercise helps to prevent future muscle strains. SEEK MEDICAL  CARE IF:   You have increasing pain or swelling in the injured area.  You have numbness, tingling, or a significant loss of strength in the injured area. MAKE SURE YOU:   Understand these instructions.  Will watch your condition.  Will get help right away if you are not doing well or get worse. Document Released: 05/20/2005 Document Revised: 03/10/2013 Document Reviewed: 12/17/2012 [facility] Patient Information 2014 [place], [place].  Thoracic Strain Thoracic strain is an injury to the muscles of the upper back. A mild strain may take only 1 week to heal. Torn muscles or tendons may take 6 weeks to 2 months to heal. HOME CARE  Put ice on the injured area.  Put ice in a plastic bag.  Place a towel between your skin and the bag.  Leave the ice on for 15-20 minutes, 03-04 times a day, for the first 2 days.  Only take medicine as told by your doctor.  Go to physical therapy and perform exercises as told by  your doctor.  Use wraps and back braces as told by your doctor.  Warm up before being active. GET HELP RIGHT AWAY IF:   There is more bruising, puffiness (swelling), or pain.  Medicine does not help the pain.  You have trouble breathing, chest pain, or a fever.  Your problems seem to be getting worse, not better. MAKE SURE YOU:   Understand these instructions.  Will watch your condition.  Will get help right away if you are not doing well or get worse. Document Released: 11/06/2007 Document Revised: 08/12/2011 Document Reviewed: 07/09/2010 [facility] Patient Information 2014 [place], [place].  Overactive Bladder, Adult The bladder has two functions that are totally opposite of the other. One is to relax and stretch out so it can store urine (fills like a balloon), and the other is to contract and squeeze down so that it can empty the urine that it has stored. Proper functioning of the bladder is a complex mixing of these two functions. The filling and emptying of the  bladder can be influenced by:  The bladder.  The spinal cord.  The brain.  The nerves going to the bladder.  Other organs that are closely related to the bladder such as prostate in males and the vagina in females. As your bladder fills with urine, nerve signals are sent from the bladder to the brain to tell you that you may need to urinate. Normal urination requires that the bladder squeeze down with sufficient strength to empty the bladder, but this also requires that the bladder squeeze down sufficiently long to finish the job. In addition the sphincter muscles, which normally keep you from leaking urine, must also relax so that the urine can pass. Coordination between the bladder muscle squeezing down and the sphincter muscles relaxing is required to make everything happen normally. With an overactive bladder sometimes the muscles of the bladder contract unexpectedly and involuntarily and this causes an urgent need to urinate. The normal response is to try to hold urine in by contracting the sphincter muscles. Sometimes the bladder contracts so strongly that the sphincter muscles cannot stop the urine from passing out and incontinence occurs. This kind of incontinence is called urge incontinence. Having an overactive bladder can be embarrassing and awkward. It can keep you from living life the way you want to. Many people think it is just something you have to put up with as you grow older or have certain health conditions. In fact, there are treatments that can help make your life easier and more pleasant. CAUSES  Many things can cause an overactive bladder. Possibilities include:  Urinary tract infection or infection of nearby tissues such as the prostate.  Prostate enlargement.  In women, multiple pregnancies or surgery on the uterus or urethra.  Bladder stones, inflammation or tumors.  Caffeine.  Alcohol.  Medications. For example, diuretics (drugs that help the body get rid of  extra fluid) increase urine production. Some other medicines must be taken with lots of fluids.  Muscle or nerve weakness. This might be the result of a spinal cord injury, a stroke, multiple sclerosis or Parkinson's disease.  Diabetes can cause a high urine volume which fills the bladder so quickly that the normal urge to urinate is triggered very strongly. SYMPTOMS   Loss of bladder control. You feel the need to urinate and cannot make your body wait.  Sudden, strong urges to urinate.  Urinating 8 or more times a day.  Waking up to urinate two or  more times a night. DIAGNOSIS  To decide if you have overactive bladder, your healthcare provider will probably:  Ask about symptoms you have noticed.  Ask about your overall health. This will include questions about any medications you are taking.  Do a physical examination. This will help determine if there are obvious blockages or other problems.  Order some tests. These might include:  A blood test to check for diabetes or other health issues that could be contributing to the problem.  Urine testing. This could measure the flow of urine and the pressure on the bladder.  A test of your neurological system (the brain, spinal cord and nerves). This is the system that senses the need to urinate. Some of these tests are called flow tests, bladder pressure tests and electrical measurements of the sphincter muscle.  A bladder test to check whether it is emptying completely when you urinate.  Cytoscopy. This test uses a thin tube with a tiny camera on it. It offers a look inside your urethra and bladder to see if there are problems.  Imaging tests. You might be given a contrast dye and then asked to urinate. X-rays are taken to see how your bladder is working. TREATMENT  An overactive bladder can be treated in many ways. The treatment will depend on the cause. Whether you have a mild or severe case also makes a difference. Often, treatment  can be given in your healthcare provider's office or clinic. Be sure to discuss the different options with your caregiver. They include:  Behavioral treatments. These do not involve medication or surgery:  Bladder training. For this, you would follow a schedule to urinate at regular intervals. This helps you learn to control the urge to urinate. At first, you might be asked to wait a few minutes after feeling the urge. In time, you should be able to schedule bathroom visits an hour or more apart.  Kegel exercises. These exercises strengthen the pelvic floor muscles, which support the bladder. By toning these muscles, they can help control urination, even if the bladder muscles are overactive. A specialist will teach you how to do these exercises correctly. They will require daily practice.  Weight loss. If you are obese or overweight, losing weight might stop your bladder from being overactive. Talk to your healthcare provider about how many pounds you should lose. Also ask if there is a specific program or method that would work best for you.  Diet change. This might be suggested if constipation is making your overactive bladder worse. Your healthcare provider or a nutritionist can explain ways to change what you eat to ease constipation. Other people might need to take in less caffeine or alcohol. Sometimes drinking fewer fluids is needed, too.  Protection. This is not an actual treatment. But, you could wear special pads to take care of any leakage while you wait for other treatments to take effect. This will help you avoid embarrassment.  Physical treatments.  Electrical stimulation. Electrodes will send gentle pulses to the nerves or muscles that help control the bladder. The goal is to strengthen them. Sometimes this is done with the electrodes outside of the body. Or, they might be placed inside the body (implanted). This treatment can take several months to have an effect.  Medications.  These are usually used along with other treatments. Several medicines are available. Some are injected into the muscles involved in urination. Others come in pill form. Medications sometimes prescribed include:  Anticholinergics. These drugs  block the signals that the nerves deliver to the bladder. This keeps it from releasing urine at the wrong time. Researchers think the drugs might help in other ways, too.  Imipramine. This is an antidepressant. But, it relaxes bladder muscles.  Botox. This is still experimental. Some people believe that injecting it into the bladder muscles will relax them so they work more normally. It has also been injected into the sphincter muscle when the sphincter muscle does not open properly. This is a temporary fix, however. Also, it might make matters worse, especially in older people.  Surgery.  A device might be implanted to help manage your nerves. It works on the nerves that signal when you need to urinate.  Surgery is sometimes needed with electrical stimulation. If the electrodes are implanted, this is done through surgery.  Sometimes repairs need to be made through surgery. For example, the size of the bladder can be changed. This is usually done in severe cases only. HOME CARE INSTRUCTIONS   Take any medications your healthcare provider prescribed or suggested. Follow the directions carefully.  Practice any lifestyle changes that are recommended. These might include:  Drinking less fluid or drinking at different times of the day. If you need to urinate often during the night, for example, you may need to stop drinking fluids early in the evening.  Cutting down on caffeine or alcohol. They can both make an overactive bladder worse. Caffeine is found in coffee, tea and sodas.  Doing Kegel exercises to strengthen muscles.  Losing weight, if that is recommended.  Eating a healthy and balanced diet. This will help you avoid constipation.  Keep a  journal or a log. You might be asked to record how much you drink and when, and also when you feel the need to urinate.  Learn how to care for implants or other devices, such as pessaries. SEEK MEDICAL CARE IF:   Your overactive bladder gets worse.  You feel increased pain or irritation when you urinate.  You notice blood in your urine.  You have questions about any medications or devices that your healthcare provider recommended.  You notice blood, pus or swelling at the site of any test or treatment procedure.  You have an oral temperature above 102 F (38.9 C). SEEK IMMEDIATE MEDICAL CARE IF:  You have an oral temperature above 102 F (38.9 C), not controlled by medicine. Document Released: 03/16/2009 Document Revised: 08/12/2011 Document Reviewed: 03/16/2009 [facility] Patient Information 2014 [place].

## 2013-09-21 NOTE — ED Notes (Signed)
C/o pain L mid back onset 3 days.  No known injury.  No burning but has  freq. of urination. No hematuria.  No hx. kidney stones.  After she urinates and wipes, she started leaking urine again. This just happened once now while getting her urine sample. Has felt 2 bulging areas on her spine that are painful.

## 2013-09-21 NOTE — ED Provider Notes (Signed)
CSN: [id number]     Arrival date & time 09/21/13  1553 History   First MD Initiated Contact with Patient 09/21/13 1728     Chief Complaint  Patient presents with  . Flank Pain   (Consider location/radiation/quality/duration/timing/severity/associated sxs/prior Treatment) HPI Comments: 38 year old female, morbidly obese with pain in the left mid back for 3 days. He states it is constant and the only thing that helps partially is " slumping". 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  Second complaint is of urinary frequency. No dysuria.     Past Medical History  Diagnosis Date  . Asthma   . Preterm labor   . Headache(784.0)   . Heart murmur   . Hypertension     on meds  . Depression     hx, doing ok now  . Ovarian cyst    Past Surgical History  Procedure Laterality Date  . Tubal ligation  1998   Family History  Problem Relation Age of Onset  . Hyperlipidemia Mother   . Cancer Son   . Other Neg Hx   . Hypertension Other   . Diabetes Other    History  Substance Use Topics  . Smoking status: Never Smoker   . Smokeless tobacco: Never Used  . Alcohol Use: No   OB History   Grav Para Term Preterm Abortions TAB SAB Ect Mult Living   7 5 3 2 2  0 2 0 0 5     Review of Systems  Constitutional: Negative.   HENT: Negative.   Respiratory: Negative.   Cardiovascular: Negative.   Gastrointestinal: Negative.   Genitourinary: Positive for frequency. Negative for dysuria and pelvic pain.  Musculoskeletal: Positive for back pain.  Skin: Negative.   Neurological: Negative.     Allergies  Doxycycline and Flagyl  Home Medications   Prior to Admission medications   Medication Sig Start Date End Date Taking? Authorizing Provider  acetaminophen (TYLENOL) 325 MG tablet Take 650 mg by mouth every 6 (six) hours as needed (pain).    Historical Provider, MD  albuterol (PROVENTIL HFA;VENTOLIN HFA) 108 (90 BASE) MCG/ACT inhaler Inhale 1-2 puffs into  the lungs every 6 (six) hours as needed for wheezing or shortness of breath (wheezing).    Historical Provider, MD  benzonatate (TESSALON) 100 MG capsule Take 1 capsule (100 mg total) by mouth every 8 (eight) hours. 06/08/13   [name], PA-C  dextromethorphan (DELSYM) 30 MG/5ML liquid Take 60 mg by mouth as needed for cough (cough).    Historical Provider, MD  loratadine (CLARITIN) 10 MG tablet Take 10 mg by mouth daily.    Historical Provider, MD  pseudoephedrine-guaifenesin (MUCINEX D) 60-600 MG per tablet Take 1 tablet by mouth every 12 (twelve) hours.    Historical Provider, MD  triamterene-hydrochlorothiazide (DYAZIDE) 37.5-25 MG per capsule Take 1 capsule by mouth daily.    Historical Provider, MD   BP 157/118  Pulse 100  Temp(Src) 98.8 F (37.1 C) (Oral)  Resp 20  SpO2 100%  LMP 09/02/2013 Physical Exam  Nursing note and vitals reviewed. Constitutional: She is oriented to person, place, and time. She appears well-developed and well-nourished. No distress.  Severe and morbid obesity  Eyes: Conjunctivae and EOM are normal.  Neck: Normal range of motion. Neck supple.  Cardiovascular: Normal rate, regular rhythm and normal heart sounds.   Pulmonary/Chest: Effort normal and breath sounds normal. No respiratory distress. She has no wheezes. She has no rales.  Abdominal:  Soft. There is no tenderness.  Musculoskeletal: She exhibits tenderness. She exhibits no edema.  Marked tenderness to palpation of the left para thoracic musculature. There is tenderness to the lower thoracic spine with there is firm mobile mass is consistent with muscle spasm. This reproduces the pain for which she presents. No pain in the cervical spine or the lumbar spine.  Lymphadenopathy:    She has no cervical adenopathy.  Neurological: She is alert and oriented to person, place, and time. She exhibits normal muscle tone.  Skin: Skin is warm and dry.  Psychiatric: She has a normal mood and affect.    ED  Course  Procedures (including critical care time) Labs Review Labs Reviewed  POCT URINALYSIS DIP (DEVICE)    Imaging Review No results found. Results for orders placed during the hospital encounter of 09/21/13  POCT URINALYSIS DIP (DEVICE)      Result Value Ref Range   Glucose, UA NEGATIVE  NEGATIVE mg/dL   Bilirubin Urine NEGATIVE  NEGATIVE   Ketones, ur NEGATIVE  NEGATIVE mg/dL   Specific Gravity, Urine 1.025  1.005 - 1.030   Hgb urine dipstick NEGATIVE  NEGATIVE   pH 5.5  5.0 - 8.0   Protein, ur NEGATIVE  NEGATIVE mg/dL   Urobilinogen, UA 0.2  0.0 - 1.0 mg/dL   Nitrite NEGATIVE  NEGATIVE   Leukocytes, UA NEGATIVE  NEGATIVE     MDM   1. Back pain, thoracic   2. Strain of thoracic paraspinal muscles excluding T1 and T2 levels   3. Muscle strain   4. OAB (overactive bladder)     toradol 60 mg IM cataflam tid Norco 7.5 mg qid prn Flexeril 5 mg tid prn. Heat and stretdhes.  If not improved in a few d f/u with PCP for possible PT     Janne Napoleon, NP 09/21/13 1814

## 2013-09-22 NOTE — ED Provider Notes (Signed)
Medical screening examination/treatment/procedure(s) were performed by non-physician practitioner and as supervising physician I was immediately available for consultation/collaboration.  Malik Ruffino, M.D.  Jamarco Zaldivar C Nuh Lipton, MD 09/22/13 0001 

## 2014-03-30 ENCOUNTER — Inpatient Hospital Stay (HOSPITAL_COMMUNITY)
Admission: AD | Admit: 2014-03-30 | Discharge: 2014-03-30 | Disposition: A | Discharge status: Home / Self Care | Payer: Medicaid Other | Source: Ambulatory Visit | Attending: Obstetrics & Gynecology | Admitting: Obstetrics & Gynecology

## 2014-03-30 ENCOUNTER — Inpatient Hospital Stay (HOSPITAL_COMMUNITY): Payer: Medicaid Other

## 2014-03-30 ENCOUNTER — Encounter (HOSPITAL_COMMUNITY): Payer: Self-pay | Admitting: General Practice

## 2014-03-30 DIAGNOSIS — I10 Essential (primary) hypertension: Secondary | ICD-10-CM | POA: Diagnosis not present

## 2014-03-30 DIAGNOSIS — R109 Unspecified abdominal pain: Secondary | ICD-10-CM

## 2014-03-30 DIAGNOSIS — R1032 Left lower quadrant pain: Secondary | ICD-10-CM | POA: Insufficient documentation

## 2014-03-30 DIAGNOSIS — D259 Leiomyoma of uterus, unspecified: Secondary | ICD-10-CM

## 2014-03-30 DIAGNOSIS — D251 Intramural leiomyoma of uterus: Secondary | ICD-10-CM | POA: Insufficient documentation

## 2014-03-30 DIAGNOSIS — Z791 Long term (current) use of non-steroidal anti-inflammatories (NSAID): Secondary | ICD-10-CM | POA: Insufficient documentation

## 2014-03-30 DIAGNOSIS — J45909 Unspecified asthma, uncomplicated: Secondary | ICD-10-CM | POA: Insufficient documentation

## 2014-03-30 LAB — POCT PREGNANCY, URINE: PREG TEST UR: NEGATIVE

## 2014-03-30 LAB — COMPREHENSIVE METABOLIC PANEL
ALT: 8 U/L (ref 0–35)
ANION GAP: 11 (ref 5–15)
AST: 12 U/L (ref 0–37)
Albumin: 3.8 g/dL (ref 3.5–5.2)
Alkaline Phosphatase: 83 U/L (ref 39–117)
BUN: 10 mg/dL (ref 6–23)
CALCIUM: 9.9 mg/dL (ref 8.4–10.5)
CO2: 26 mEq/L (ref 19–32)
Chloride: 103 mEq/L (ref 96–112)
Creatinine, Ser: 0.97 mg/dL (ref 0.50–1.10)
GFR calc Af Amer: 84 mL/min — ABNORMAL LOW (ref >90)
GFR calc non Af Amer: 72 mL/min — ABNORMAL LOW (ref >90)
Glucose, Bld: 92 mg/dL (ref 70–99)
POTASSIUM: 3.9 meq/L (ref 3.7–5.3)
SODIUM: 140 meq/L (ref 137–147)
TOTAL PROTEIN: 8.4 g/dL — AB (ref 6.0–8.3)
Total Bilirubin: 0.2 mg/dL — ABNORMAL LOW (ref 0.3–1.2)

## 2014-03-30 LAB — URINALYSIS, ROUTINE W REFLEX MICROSCOPIC
Bilirubin Urine: NEGATIVE
Glucose, UA: NEGATIVE mg/dL
Hgb urine dipstick: NEGATIVE
Ketones, ur: NEGATIVE mg/dL
LEUKOCYTES UA: NEGATIVE
NITRITE: NEGATIVE
Protein, ur: NEGATIVE mg/dL
Specific Gravity, Urine: 1.02 (ref 1.005–1.030)
UROBILINOGEN UA: 1 mg/dL (ref 0.0–1.0)
pH: 6 (ref 5.0–8.0)

## 2014-03-30 LAB — WET PREP, GENITAL
Trich, Wet Prep: NONE SEEN
YEAST WET PREP: NONE SEEN

## 2014-03-30 LAB — CBC
HCT: 42 % (ref 36.0–46.0)
Hemoglobin: 14 g/dL (ref 12.0–15.0)
MCH: 28 pg (ref 26.0–34.0)
MCHC: 33.3 g/dL (ref 30.0–36.0)
MCV: 84 fL (ref 78.0–100.0)
Platelets: 355 10*3/uL (ref 150–400)
RBC: 5 MIL/uL (ref 3.87–5.11)
RDW: 13.1 % (ref 11.5–15.5)
WBC: 7 10*3/uL (ref 4.0–10.5)

## 2014-03-30 LAB — LIPASE, BLOOD: Lipase: 34 U/L (ref 11–59)

## 2014-03-30 LAB — AMYLASE: Amylase: 109 U/L — ABNORMAL HIGH (ref 0–105)

## 2014-03-30 MED ORDER — KETOROLAC TROMETHAMINE 60 MG/2ML IM SOLN
60.0000 mg | Freq: Once | INTRAMUSCULAR | Status: AC
  Administered 2014-03-30: 60 mg via INTRAMUSCULAR

## 2014-03-30 MED ORDER — LISINOPRIL 10 MG PO TABS: 10.0000 mg | ORAL_TABLET | ORAL | Status: DC

## 2014-03-30 MED ORDER — IBUPROFEN 600 MG PO TABS: 600.0000 mg | ORAL_TABLET | Freq: Four times a day (QID) | ORAL | Status: DC | PRN

## 2014-03-30 MED ORDER — VALSARTAN 80 MG PO TABS: 80.0000 mg | ORAL_TABLET | Freq: Every day | ORAL | Status: DC

## 2014-03-30 NOTE — Discharge Instructions (Signed)
Uterine Fibroid A uterine fibroid is a growth (tumor) that occurs in your uterus. This type of tumor is not cancerous and does not spread out of the uterus. You can have one or many fibroids. Fibroids can vary in size, weight, and where they grow in the uterus. Some can become quite large. Most fibroids do not require medical treatment, but some can cause pain or heavy bleeding during and between periods. CAUSES  A fibroid is the result of a single uterine cell that keeps growing (unregulated), which is different than most cells in the human body. Most cells have a control mechanism that keeps them from reproducing without control.  SIGNS AND SYMPTOMS   Bleeding.  Pelvic pain and pressure.  Bladder problems due to the size of the fibroid.  Infertility and miscarriages depending on the size and location of the fibroid. DIAGNOSIS  Uterine fibroids are diagnosed through a physical exam. Your health care provider may feel the lumpy tumors during a pelvic exam. Ultrasonography may be done to get information regarding size, location, and number of tumors.  TREATMENT   Your health care provider may recommend watchful waiting. This involves getting the fibroid checked by your health care provider to see if it grows or shrinks.   Hormone treatment or an intrauterine device (IUD) may be prescribed.   Surgery may be needed to remove the fibroids (myomectomy) or the uterus (hysterectomy). This depends on your situation. When fibroids interfere with fertility and a woman wants to become pregnant, a health care provider may recommend having the fibroids removed.  [place] care depends on how you were treated. In general:   Keep all follow-up appointments with your health care provider.   Only take over-the-counter or prescription medicines as directed by your health care provider. If you were prescribed a hormone treatment, take the hormone medicines exactly as directed. Do not  take aspirin. It can cause bleeding.   Talk to your health care provider about taking iron pills.  If your periods are troublesome but not so heavy, lie down with your feet raised slightly above your heart. Place cold packs on your lower abdomen.   If your periods are heavy, write down the number of pads or tampons you use per month. Bring this information to your health care provider.   Include green vegetables in your diet.  SEEK IMMEDIATE MEDICAL CARE IF:  You have pelvic pain or cramps not controlled with medicines.   You have a sudden increase in pelvic pain.   You have an increase in bleeding between and during periods.   You have excessive periods and soak tampons or pads in a half hour or less.  You feel lightheaded or have fainting episodes. Document Released: 05/17/2000 Document Revised: 03/10/2013 Document Reviewed: 12/17/2012 [facility] Patient Information 2015 [place], [place]. This information is not intended to replace advice given to you by your health care provider. Make sure you discuss any questions you have with your health care provider. Hypertension Hypertension, commonly called high blood pressure, is when the force of blood pumping through your arteries is too strong. Your arteries are the blood vessels that carry blood from your heart throughout your body. A blood pressure reading consists of a higher number over a lower number, such as 110/72. The higher number (systolic) is the pressure inside your arteries when your heart pumps. The lower number (diastolic) is the pressure inside your arteries when your heart relaxes. Ideally you want your blood pressure below  120/80. Hypertension forces your heart to work harder to pump blood. Your arteries may become narrow or stiff. Having hypertension puts you at risk for heart disease, stroke, and other problems.  RISK FACTORS Some risk factors for high blood pressure are controllable. Others are not.  Risk factors you  cannot control include:   Race. You may be at higher risk if you are African American.  Age. Risk increases with age.  Gender. Men are at higher risk than women before age 63 years. After age 28, women are at higher risk than men. Risk factors you can control include:  Not getting enough exercise or physical activity.  Being overweight.  Getting too much fat, sugar, calories, or salt in your diet.  Drinking too much alcohol. SIGNS AND SYMPTOMS Hypertension does not usually cause signs or symptoms. Extremely high blood pressure (hypertensive crisis) may cause headache, anxiety, shortness of breath, and nosebleed. DIAGNOSIS  To check if you have hypertension, your health care provider will measure your blood pressure while you are seated, with your arm held at the level of your heart. It should be measured at least twice using the same arm. Certain conditions can cause a difference in blood pressure between your right and left arms. A blood pressure reading that is higher than normal on one occasion does not mean that you need treatment. If one blood pressure reading is high, ask your health care provider about having it checked again. TREATMENT  Treating high blood pressure includes making lifestyle changes and possibly taking medicine. Living a healthy lifestyle can help lower high blood pressure. You may need to change some of your habits. Lifestyle changes may include:  Following the DASH diet. This diet is high in fruits, vegetables, and whole grains. It is low in salt, red meat, and added sugars.  Getting at least 2 hours of brisk physical activity every week.  Losing weight if necessary.  Not smoking.  Limiting alcoholic beverages.  Learning ways to reduce stress. If lifestyle changes are not enough to get your blood pressure under control, your health care provider may prescribe medicine. You may need to take more than one. Work closely with your health care provider to  understand the risks and benefits. HOME CARE INSTRUCTIONS  Have your blood pressure rechecked as directed by your health care provider.   Take medicines only as directed by your health care provider. Follow the directions carefully. Blood pressure medicines must be taken as prescribed. The medicine does not work as well when you skip doses. Skipping doses also puts you at risk for problems.   Do not smoke.   Monitor your blood pressure at home as directed by your health care provider. SEEK MEDICAL CARE IF:   You think you are having a reaction to medicines taken.  You have recurrent headaches or feel dizzy.  You have swelling in your ankles.  You have trouble with your vision. SEEK IMMEDIATE MEDICAL CARE IF:  You develop a severe headache or confusion.  You have unusual weakness, numbness, or feel faint.  You have severe chest or abdominal pain.  You vomit repeatedly.  You have trouble breathing. MAKE SURE YOU:   Understand these instructions.  Will watch your condition.  Will get help right away if you are not doing well or get worse. Document Released: 05/20/2005 Document Revised: 10/04/2013 Document Reviewed: 03/12/2013 [facility] Patient Information 2015 [place], [place]. This information is not intended to replace advice given to you by your health care provider.  Make sure you discuss any questions you have with your health care provider. ° °

## 2014-03-30 NOTE — MAU Note (Signed)
Pt states pain started yesterday and has gotten worse since then.

## 2014-03-30 NOTE — MAU Note (Signed)
Pt presents to MAU with c/o left flank pain radiating to left lower abdoman

## 2014-03-30 NOTE — MAU Provider Note (Signed)
History     CSN: [id number]  Arrival date and time: 03/30/14 1854   None     Chief Complaint  Patient presents with  . Flank Pain  . Abdominal Pain   HPI RN note: Linda Hedges, RN Registered Nurse Signed Gynecology MAU Note Service date: 03/30/2014 7:33 PM   Pt presents to MAU with c/o left flank pain radiating to left lower abdoman  (Pt was seen on 03/02/2013 and noted to have fibroid uterus and simple ovarian cyst) Pt is 38 yo [id number] not pregnant presents with left flank pain radiating to LLQ abdomen Pt has had back pain in the past and took flexeril and percocet- not recently Pt has had left lower quadrant pain the the past and was told it was her fibroids; this pain is different- assoicated with back pain Pt has not taken anything for the pain Denies constipation, diarrhea, dysuria, nausea or vomiting. Pt previous pt of [facility]- in process of changing to [facility] ( has Medicaid)- has hypertension- did not take meds today Because she was" in the bed hurtin all day"  Past Medical History  Diagnosis Date  . Asthma   . Preterm labor   . Headache(784.0)   . Heart murmur   . Hypertension     on meds  . Depression     hx, doing ok now  . Ovarian cyst     Past Surgical History  Procedure Laterality Date  . Tubal ligation  1998    Family History  Problem Relation Age of Onset  . Hyperlipidemia Mother   . Cancer Son   . Other Neg Hx   . Hypertension Other   . Diabetes Other     History  Substance Use Topics  . Smoking status: Never Smoker   . Smokeless tobacco: Never Used  . Alcohol Use: No    Allergies:  Allergies  Allergen Reactions  . Doxycycline Nausea And Vomiting  . Flagyl [Metronidazole Hcl] Hives and Nausea And Vomiting    Prescriptions prior to admission  Medication Sig Dispense Refill  . acetaminophen (TYLENOL) 325 MG tablet Take 650 mg by mouth every 6 (six) hours as needed (pain).      Marland Kitchen albuterol (PROVENTIL HFA;VENTOLIN HFA) 108  (90 BASE) MCG/ACT inhaler Inhale 1-2 puffs into the lungs every 6 (six) hours as needed for wheezing or shortness of breath (wheezing).      . benzonatate (TESSALON) 100 MG capsule Take 1 capsule (100 mg total) by mouth every 8 (eight) hours.  21 capsule  0  . cyclobenzaprine (FLEXERIL) 5 MG tablet Take 1 tablet (5 mg total) by mouth 3 (three) times daily as needed for muscle spasms.  21 tablet  0  . dextromethorphan (DELSYM) 30 MG/5ML liquid Take 60 mg by mouth as needed for cough (cough).      . diclofenac (CATAFLAM) 50 MG tablet Take 1 tablet (50 mg total) by mouth 3 (three) times daily.  21 tablet  0  . HYDROcodone-acetaminophen (NORCO) 7.5-325 MG per tablet Take 1 tablet by mouth every 4 (four) hours as needed.  15 tablet  0  . loratadine (CLARITIN) 10 MG tablet Take 10 mg by mouth daily.      . pseudoephedrine-guaifenesin (MUCINEX D) 60-600 MG per tablet Take 1 tablet by mouth every 12 (twelve) hours.      . triamterene-hydrochlorothiazide (DYAZIDE) 37.5-25 MG per capsule Take 1 capsule by mouth daily.        Review of Systems  Constitutional:  Negative for fever and chills.  Gastrointestinal: Positive for abdominal pain. Negative for nausea, vomiting, diarrhea and constipation.  Genitourinary: Negative for dysuria.  Musculoskeletal: Positive for back pain.   Physical Exam   Patient Vitals for the past 24 hrs:  BP Temp Temp src Pulse Resp Height Weight  03/30/14 2257 152/108 mmHg - - 78 18 - -  03/30/14 1937 151/100 mmHg 99 F (37.2 C) Oral 88 20 5\' 1"  (1.549 m) 135.444 kg (298 lb 9.6 oz)  Blood pressure on one take 170s/100s per RN  Physical Exam  Nursing note and vitals reviewed. Constitutional: She appears well-developed and well-nourished. No distress.  HENT:  Head: Normocephalic.  Eyes: Pupils are equal, round, and reactive to light.  Neck: Normal range of motion. Neck supple.  Cardiovascular: Normal rate.   Respiratory: Effort normal.  GI: Soft. She exhibits no  distension. There is tenderness. There is no rebound and no guarding.  Genitourinary:  Vaginal- small amount of white discharge in vault Cervix clean, mildly tender Diffuse bilateral tenderness with palpation-no rebound Uterus difficult to outline due to habitus- diffusely tender  Musculoskeletal: Normal range of motion.  Neurological: She is alert.  Skin: Skin is warm and dry.  Psychiatric: She has a normal mood and affect.    MAU Course  Procedures Toradol 60mg  IM given for pain Results for orders placed during the hospital encounter of 03/30/14 (from the past 24 hour(s))  URINALYSIS, ROUTINE W REFLEX MICROSCOPIC     Status: None   Collection Time    03/30/14  7:18 PM      Result Value Ref Range   Color, Urine YELLOW  YELLOW   APPearance CLEAR  CLEAR   Specific Gravity, Urine 1.020  1.005 - 1.030   pH 6.0  5.0 - 8.0   Glucose, UA NEGATIVE  NEGATIVE mg/dL   Hgb urine dipstick NEGATIVE  NEGATIVE   Bilirubin Urine NEGATIVE  NEGATIVE   Ketones, ur NEGATIVE  NEGATIVE mg/dL   Protein, ur NEGATIVE  NEGATIVE mg/dL   Urobilinogen, UA 1.0  0.0 - 1.0 mg/dL   Nitrite NEGATIVE  NEGATIVE   Leukocytes, UA NEGATIVE  NEGATIVE  POCT PREGNANCY, URINE     Status: None   Collection Time    03/30/14  7:34 PM      Result Value Ref Range   Preg Test, Ur NEGATIVE  NEGATIVE  CBC     Status: None   Collection Time    03/30/14  8:15 PM      Result Value Ref Range   WBC 7.0  4.0 - 10.5 K/uL   RBC 5.00  3.87 - 5.11 MIL/uL   Hemoglobin 14.0  12.0 - 15.0 g/dL   HCT 42.0  36.0 - 46.0 %   MCV 84.0  78.0 - 100.0 fL   MCH 28.0  26.0 - 34.0 pg   MCHC 33.3  30.0 - 36.0 g/dL   RDW 13.1  11.5 - 15.5 %   Platelets 355  150 - 400 K/uL  COMPREHENSIVE METABOLIC PANEL     Status: Abnormal   Collection Time    03/30/14  8:15 PM      Result Value Ref Range   Sodium 140  137 - 147 mEq/L   Potassium 3.9  3.7 - 5.3 mEq/L   Chloride 103  96 - 112 mEq/L   CO2 26  19 - 32 mEq/L   Glucose, Bld 92  70 - 99  mg/dL   BUN 10  6 -  23 mg/dL   Creatinine, Ser 0.97  0.50 - 1.10 mg/dL   Calcium 9.9  8.4 - 10.5 mg/dL   Total Protein 8.4 (*) 6.0 - 8.3 g/dL   Albumin 3.8  3.5 - 5.2 g/dL   AST 12  0 - 37 U/L   ALT 8  0 - 35 U/L   Alkaline Phosphatase 83  39 - 117 U/L   Total Bilirubin 0.2 (*) 0.3 - 1.2 mg/dL   GFR calc non Af Amer 72 (*) >90 mL/min   GFR calc Af Amer 84 (*) >90 mL/min   Anion gap 11  5 - 15  AMYLASE     Status: Abnormal   Collection Time    03/30/14  8:15 PM      Result Value Ref Range   Amylase 109 (*) 0 - 105 U/L  LIPASE, BLOOD     Status: None   Collection Time    03/30/14  8:15 PM      Result Value Ref Range   Lipase 34  11 - 59 U/L  WET PREP, GENITAL     Status: Abnormal   Collection Time    03/30/14  8:40 PM      Result Value Ref Range   Yeast Wet Prep HPF POC NONE SEEN  NONE SEEN   Trich, Wet Prep NONE SEEN  NONE SEEN   Clue Cells Wet Prep HPF POC FEW (*) NONE SEEN   WBC, Wet Prep HPF POC FEW (*) NONE SEEN   Care handed over to [facility] [place], CNM  Assessment and Plan   US Transvaginal Non-ob  03/30/2014   CLINICAL DATA:  Left lower quadrant left flank pain for 2 days. Prior bilateral tubal ligation. Known fibroids.  EXAM: TRANSABDOMINAL ULTRASOUND OF PELVIS  TECHNIQUE: Transabdominal ultrasound examination of the pelvis was performed including evaluation of the uterus, ovaries, adnexal regions, and pelvic cul-de-sac.  COMPARISON:  03/02/2013  FINDINGS: Uterus  Measurements: 11.8 by 5.5 by 5.6 cm. Visualized fibroids include a right fundal 3.5 by 3.5 by 3.4 cm intramural hypoechoic lesion; a 1.9 by 1.6 by 2.7 cm left uterine body lesion near the fundus which is isoechoic; and a 2.9 by 2.2 by 2.5 cm hypoechoic left intramural uterine body fibroid. These are all mildly reduced in size compared to prior.  Endometrium  Thickness: 1.1 cm.  No focal abnormality visualized.  Right ovary  Not visible transabdominally or transvaginally.  Left ovary  Measurements: 2.5 by 1.8  by 2.1 cm. Normal appearance/no adnexal mass.  Other findings:  No free fluid  IMPRESSION: 1. Reduction in size of the intramural fibroids in the uterus. 2. Nonvisualization of the right ovary.   Electronically Signed   By: Sherryl Barters M.D.   On: 03/30/2014 22:25   US Pelvis Complete  03/30/2014   CLINICAL DATA:  Left lower quadrant left flank pain for 2 days. Prior bilateral tubal ligation. Known fibroids.  EXAM: TRANSABDOMINAL ULTRASOUND OF PELVIS  TECHNIQUE: Transabdominal ultrasound examination of the pelvis was performed including evaluation of the uterus, ovaries, adnexal regions, and pelvic cul-de-sac.  COMPARISON:  03/02/2013  FINDINGS: Uterus  Measurements: 11.8 by 5.5 by 5.6 cm. Visualized fibroids include a right fundal 3.5 by 3.5 by 3.4 cm intramural hypoechoic lesion; a 1.9 by 1.6 by 2.7 cm left uterine body lesion near the fundus which is isoechoic; and a 2.9 by 2.2 by 2.5 cm hypoechoic left intramural uterine body fibroid. These are all mildly reduced in size compared to prior.  Endometrium  Thickness: 1.1 cm.  No focal abnormality visualized.  Right ovary  Not visible transabdominally or transvaginally.  Left ovary  Measurements: 2.5 by 1.8 by 2.1 cm. Normal appearance/no adnexal mass.  Other findings:  No free fluid  IMPRESSION: 1. Reduction in size of the intramural fibroids in the uterus. 2. Nonvisualization of the right ovary.   Electronically Signed   By: Sherryl Barters M.D.   On: 03/30/2014 22:25   LINEBERRY,SUSAN 03/30/2014, 7:43 PM   A: 1. Abdominal pain in female   2. Uterine leiomyoma, unspecified location   3. Essential hypertension     P:  Consult Dr Elonda Husky Discussed adding Lisinopril 10 mg daily but pt reports h/a with this medication in the past Diovan 80 mg daily Rx to pharmacy Continue HCTZ daily Ibuprofen 600 mg PO Q 6 hours PRN F/U with primary care, pt given contact information to find new primary care provider F/U with [facility] for routine Gyn  care Return to MAU as needed for emergencies   Fatima Blank Certified Nurse-Midwife

## 2014-03-31 LAB — HIV ANTIBODY (ROUTINE TESTING W REFLEX): HIV: NONREACTIVE

## 2014-03-31 LAB — GC/CHLAMYDIA PROBE AMP
CT PROBE, AMP APTIMA: NEGATIVE
GC Probe RNA: NEGATIVE

## 2014-04-04 ENCOUNTER — Encounter (HOSPITAL_COMMUNITY): Payer: Self-pay | Admitting: General Practice

## 2014-06-23 ENCOUNTER — Encounter (HOSPITAL_COMMUNITY): Payer: Self-pay | Admitting: Neurology

## 2014-06-23 ENCOUNTER — Emergency Department (HOSPITAL_COMMUNITY)
Admission: EM | Admit: 2014-06-23 | Discharge: 2014-06-23 | Disposition: A | Discharge status: Home / Self Care | Payer: Medicaid Other | Attending: Emergency Medicine | Admitting: Emergency Medicine

## 2014-06-23 DIAGNOSIS — Z3202 Encounter for pregnancy test, result negative: Secondary | ICD-10-CM | POA: Diagnosis not present

## 2014-06-23 DIAGNOSIS — I10 Essential (primary) hypertension: Secondary | ICD-10-CM | POA: Insufficient documentation

## 2014-06-23 DIAGNOSIS — M25512 Pain in left shoulder: Secondary | ICD-10-CM | POA: Diagnosis not present

## 2014-06-23 DIAGNOSIS — F329 Major depressive disorder, single episode, unspecified: Secondary | ICD-10-CM | POA: Insufficient documentation

## 2014-06-23 DIAGNOSIS — J45909 Unspecified asthma, uncomplicated: Secondary | ICD-10-CM | POA: Diagnosis not present

## 2014-06-23 DIAGNOSIS — Z79899 Other long term (current) drug therapy: Secondary | ICD-10-CM | POA: Diagnosis not present

## 2014-06-23 DIAGNOSIS — Y998 Other external cause status: Secondary | ICD-10-CM | POA: Diagnosis not present

## 2014-06-23 DIAGNOSIS — Z8742 Personal history of other diseases of the female genital tract: Secondary | ICD-10-CM | POA: Diagnosis not present

## 2014-06-23 DIAGNOSIS — R011 Cardiac murmur, unspecified: Secondary | ICD-10-CM | POA: Insufficient documentation

## 2014-06-23 DIAGNOSIS — S233XXA Sprain of ligaments of thoracic spine, initial encounter: Secondary | ICD-10-CM | POA: Insufficient documentation

## 2014-06-23 DIAGNOSIS — S24109A Unspecified injury at unspecified level of thoracic spinal cord, initial encounter: Secondary | ICD-10-CM | POA: Diagnosis present

## 2014-06-23 DIAGNOSIS — Y9389 Activity, other specified: Secondary | ICD-10-CM | POA: Diagnosis not present

## 2014-06-23 DIAGNOSIS — M25572 Pain in left ankle and joints of left foot: Secondary | ICD-10-CM | POA: Insufficient documentation

## 2014-06-23 DIAGNOSIS — Y9289 Other specified places as the place of occurrence of the external cause: Secondary | ICD-10-CM | POA: Diagnosis not present

## 2014-06-23 DIAGNOSIS — S239XXA Sprain of unspecified parts of thorax, initial encounter: Secondary | ICD-10-CM

## 2014-06-23 DIAGNOSIS — X58XXXA Exposure to other specified factors, initial encounter: Secondary | ICD-10-CM | POA: Diagnosis not present

## 2014-06-23 LAB — COMPREHENSIVE METABOLIC PANEL
ALK PHOS: 71 U/L (ref 39–117)
ALT: 10 U/L (ref 0–35)
ANION GAP: 7 (ref 5–15)
AST: 15 U/L (ref 0–37)
Albumin: 3.5 g/dL (ref 3.5–5.2)
BILIRUBIN TOTAL: 0.3 mg/dL (ref 0.3–1.2)
BUN: 12 mg/dL (ref 6–23)
CHLORIDE: 106 meq/L (ref 96–112)
CO2: 25 mmol/L (ref 19–32)
CREATININE: 1 mg/dL (ref 0.50–1.10)
Calcium: 8.9 mg/dL (ref 8.4–10.5)
GFR calc Af Amer: 81 mL/min — ABNORMAL LOW (ref >90)
GFR calc non Af Amer: 69 mL/min — ABNORMAL LOW (ref >90)
GLUCOSE: 101 mg/dL — AB (ref 70–99)
POTASSIUM: 4.2 mmol/L (ref 3.5–5.1)
Sodium: 138 mmol/L (ref 135–145)
Total Protein: 6.9 g/dL (ref 6.0–8.3)

## 2014-06-23 LAB — CBC WITH DIFFERENTIAL/PLATELET
BASOS PCT: 0 % (ref 0–1)
Basophils Absolute: 0 10*3/uL (ref 0.0–0.1)
Eosinophils Absolute: 0.2 10*3/uL (ref 0.0–0.7)
Eosinophils Relative: 4 % (ref 0–5)
HCT: 39.4 % (ref 36.0–46.0)
HEMOGLOBIN: 13 g/dL (ref 12.0–15.0)
Lymphocytes Relative: 40 % (ref 12–46)
Lymphs Abs: 2.3 10*3/uL (ref 0.7–4.0)
MCH: 28.3 pg (ref 26.0–34.0)
MCHC: 33 g/dL (ref 30.0–36.0)
MCV: 85.7 fL (ref 78.0–100.0)
MONOS PCT: 8 % (ref 3–12)
Monocytes Absolute: 0.4 10*3/uL (ref 0.1–1.0)
Neutro Abs: 2.7 10*3/uL (ref 1.7–7.7)
Neutrophils Relative %: 48 % (ref 43–77)
Platelets: 307 10*3/uL (ref 150–400)
RBC: 4.6 MIL/uL (ref 3.87–5.11)
RDW: 13.2 % (ref 11.5–15.5)
WBC: 5.7 10*3/uL (ref 4.0–10.5)

## 2014-06-23 LAB — I-STAT TROPONIN, ED: TROPONIN I, POC: 0 ng/mL (ref 0.00–0.08)

## 2014-06-23 LAB — LIPASE, BLOOD: Lipase: 35 U/L (ref 11–59)

## 2014-06-23 LAB — URINALYSIS, ROUTINE W REFLEX MICROSCOPIC
BILIRUBIN URINE: NEGATIVE
GLUCOSE, UA: NEGATIVE mg/dL
Hgb urine dipstick: NEGATIVE
Ketones, ur: NEGATIVE mg/dL
LEUKOCYTES UA: NEGATIVE
Nitrite: NEGATIVE
PROTEIN: NEGATIVE mg/dL
SPECIFIC GRAVITY, URINE: 1.018 (ref 1.005–1.030)
Urobilinogen, UA: 0.2 mg/dL (ref 0.0–1.0)
pH: 5 (ref 5.0–8.0)

## 2014-06-23 LAB — PREGNANCY, URINE: Preg Test, Ur: NEGATIVE

## 2014-06-23 MED ORDER — TRIAMTERENE-HCTZ 37.5-25 MG PO TABS: 1.0000 | ORAL_TABLET | Freq: Every day | ORAL | Status: DC

## 2014-06-23 MED ORDER — FENTANYL CITRATE 0.05 MG/ML IJ SOLN
50.0000 ug | Freq: Once | INTRAMUSCULAR | Status: DC
  Filled 2014-06-23: qty 2

## 2014-06-23 MED ORDER — HYDROCHLOROTHIAZIDE 12.5 MG PO TABS: 12.5000 mg | ORAL_TABLET | Freq: Every day | ORAL | Status: DC

## 2014-06-23 MED ORDER — TRAMADOL HCL 50 MG PO TABS: 50.0000 mg | ORAL_TABLET | Freq: Four times a day (QID) | ORAL | Status: DC | PRN

## 2014-06-23 NOTE — ED Notes (Signed)
PA Ben notified that pt did not want an IV or IV drugs.

## 2014-06-23 NOTE — Discharge Instructions (Signed)
You were evaluated in the ED today for your flank pain, back pain, ankle pain and shoulder pain. There does not appear to be an emergent cause for your symptoms at this time. It is important that she go to the [place] and wellness to find a primary care and establish care with them. Please take your pain medicines as directed. You may continue using Tylenol and ibuprofen. Please take your blood pressure medicine as directed. Return to ED for worsening symptoms.   Emergency Department Resource Guide 1) Find a Doctor and Pay Out of Pocket Although you won't have to find out who is covered by your insurance plan, it is a good idea to ask around and get recommendations. You will then need to call the office and see if the doctor you have chosen will accept you as a new patient and what types of options they offer for patients who are self-pay. Some doctors offer discounts or will set up payment plans for their patients who do not have insurance, but you will need to ask so you aren't surprised when you get to your appointment.  2) Contact Your Local Health Department Not all health departments have doctors that can see patients for sick visits, but many do, so it is worth a call to see if yours does. If you don't know where your local health department is, you can check in your phone book. The CDC also has a tool to help you locate your state's health department, and many state websites also have listings of all of their local health departments.  3) Find a [facility] If your illness is not likely to be very severe or complicated, you may want to try a walk in clinic. These are popping up all over the country in pharmacies, drugstores, and shopping centers. They're usually staffed by nurse practitioners or physician assistants that have been trained to treat common illnesses and complaints. They're usually fairly quick and inexpensive. However, if you have serious medical issues or chronic medical  problems, these are probably not your best option.  No Primary Care Doctor: - Call Health Connect at  [phone] - they can help you locate a primary care doctor that  accepts your insurance, provides certain services, etc. - Physician Referral Service- [phone]  Chronic Pain Problems: Organization         Address  Phone   Notes  [facility]  [phone] Patients need to be referred by their primary care doctor.   Medication Assistance: Organization         Address  Phone   Notes  [facility] [address] [place], [place] [address] [phone] --Must be a resident of [facility] -- Must have NO insurance coverage whatsoever (no Medicaid/ Medicare, etc.) -- The pt. MUST have a primary care doctor that directs their care regularly and follows them in the community   [facility]ist  [phone]   [employer]  [phone]    Agencies that provide inexpensive medical care: Organization         Address  Phone   Notes  [place]  [phone]   [facility]    [phone]   [employer] [place], [place] [address] [phone]   [employer] [address], [place] [phone]   Planned Parenthood    [phone]   [facility]    [phone]   [facility]  [address], [place] Phone:  [phone], Fax:  [phone] Hours of Operation:  9 am - 6 pm, M-F.  Also accepts Medicaid/Medicare and self-pay.  [facility] for [place] [address], [address], [place] Phone: [phone], Fax: [phone]. Hours of Operation:  8:30 am - 5:30 pm, M-F.  Also accepts Medicaid and self-pay.  [facility] [place] [address] [place] Phone: [phone]   [place], [place], [place] [phone], Ext. [phone]  Mondays & Thursdays: 7-9 AM.  First 15 patients are seen on a first come, first serve basis.    [place] Providers:  Organization         Address  Phone   Notes  [facility] [address], [place] [phone] Also accepts self-pay patients.  [facility] [address], [place]  [phone]   [place], [address], [place] [phone]   [facility] [address] [place] [phone]   Lucianne Lei [address], [place]   [phone] Only accepts [place] Access [place] patients after they have their name applied to their card.   Self-Pay (no insurance) in [facility]:  Organization         Address  Phone   Notes  Sickle Cell Patients, [facility] [place] [phone]   [facility] [place] [phone]   [facility] [place]  [place], [address], [place] [phone]   [facility]/Dr. Osei-Bonsu  [address], [place] or [address], [place] [phone] Phone number for both [place] and [place] locations is the same.  [facility] [address] [place] [phone]   [facility] [address], [place] or [address] [phone]   [facility] [address], [place] [phone], phone; [phone], fax Sees patients 1st and 3rd Saturday of every month.  Must not qualify for public or private insurance (i.e. Medicaid, Medicare, [place] Health Choice, Veterans' Benefits)  Household income should be no more than 200% of the poverty level The clinic cannot treat you if you are pregnant or think you are pregnant  Sexually transmitted diseases are not treated at  the clinic.    Dental Care: Organization         Address  Phone  Notes  [facility] Department [facility] [place] [phone] Accepts children up to age 83 who are enrolled in [place] or [place]; pregnant women with a Medicaid card; and children who have applied for Medicaid or [place] Health Choice, but were declined, whose parents can pay a reduced fee at time of service.  [facility] Department of [facility]  [address], [place] [phone] Accepts children up to age 32 who are enrolled in [place] or [place]; pregnant women with a Medicaid card; and children who have applied for Medicaid or [place] Health Choice, but were declined, whose parents can pay a reduced fee at time of  service.  [place] Adult Dental Access PROGRAM  [place] [phone] Patients are seen by appointment only. Walk-ins are not accepted. [place] will see patients 95 years of age and older. Monday - Tuesday (8am-5pm) Most Wednesdays (8:30-5pm) $30 per visit, cash only  [facility] Adult [facility] Access PRO[facility]  [address], [facility] [phone] Patients are seen by appointment only. Walk-ins are not accepted. [place] will see patients 28 years of age and older. One Wednesday Evening (Monthly: Volunteer Based).  $30 per visit, cash only  [place]  [phone] for adults; Children under age 31, call Graduate Pediatric Dentistry at [phone]. Children aged 55-14, please call [phone] to request a pediatric application.  Dental services are provided in all areas of dental care including fillings, crowns and bridges, complete and partial dentures, implants, gum treatment, root canals, and extractions. Preventive care is also provided. Treatment is provided to both adults and children. Patients are selected via a lottery and there is often a  waiting list.   [facility] [address], [place]  [phone] [email or web address]   [facility] [address] [place], [place] [phone], Ext. [phone] Second and Fourth Thursday of each month, opens at 6:30 AM; Clinic ends at 9 AM.  Patients are seen on a first-come first-served basis, and a limited number are seen during each clinic.   [facility]  [address] Hillard Danker [place], [place] [phone]   Eligibility Requirements You must have lived in [place], [place], or [place] counties for at least the last three months.   You cannot be eligible for state or federal sponsored [employer], including [employer], [place], or [employer].   You generally cannot be eligible for healthcare insurance through your employer.    How to apply: Eligibility screenings are held every Tuesday and Wednesday afternoon from 1:00 pm until 4:00 pm. You do not need an appointment for the interview!  [facility] [address], [place], [place]   [place]  [place] Department  [place]  [phone]    Behavioral Health Resources in the Community: Intensive Outpatient Programs Organization         Address  Phone  Notes  [place]. [address], [place], [place] [phone]   [facility] [address] [place], [place]   [facility]: [facility] [address] [place], [place]   [place] [address],  [place], [place] or [phone]   Substance Abuse Resources Organization         Address  Phone  Notes  Alcohol and Drug Services  [phone]   [place]  [phone]   The [place]   Chinita Pester  [phone]   Residential & Outpatient Substance Abuse  Program  [phone]   Psychological Services Organization         Address  Phone  Notes  [facility]  [place]  [phone]   [place] [address], [place] [phone] or [phone]    Mobile Crisis Teams Organization         Address  Phone  Notes  [facility], Mobile Crisis Care Unit  [phone]   Assertive Psychotherapeutic Services  [place], [place]   [facility] [address] [place] [phone]    Self-Help/Support Groups Organization         Address  Phone             Notes  [place]. of [place] - variety of support groups  [place] Call for more information  Narcotics Anonymous (NA), [facility] [address], [employer] [place]  2 meetings at this location   Special educational needs teacher         Address  Phone  Notes  [facility]idential [facility] [place],    [place]  [phone]   [facility]  [address], [place] [phone], [place], [place]   [place], [place] [phone] Admissions: 8am-3pm M-F  Incentives Substance [place] [address],    [place], [place] [phone]   [facility] [address] [place], [place], [place]   [facility] [address],  [place], [place]   [facility] Programs - Intensive Outpatient [address] Kristeen [address], [place], [place]   [facility].,  [place], [place] [phone] or [phone]   [facility] Treatment Services (RTS) [address] [place], [place] Accepts Medicaid  [facility] [place] [address]  [place] [phone] Substance Abuse/Addiction Treatment   [facility] Organization          Address  Phone  Notes  [facility]  [phone]   Domenic Schwab, PhD [address] [place]lis [place], [place]   [phone] or [phone]   [facility] [place], [place] [phone]   [facility] [address] [place], [place] [phone] Insurance/Medicaid/sponsorship through [facility] and Families [address]                                    [place], [place] [phone] [place] [address], [place] [phone]    Dr. Adele Schilder  [phone]   [facility] Dept. 1) [address], [place] 2) [place] 3)  [place] [address], [place] [phone]  [phone]   [place] [phone] or [phone] (After Hours)

## 2014-06-23 NOTE — ED Notes (Signed)
Pt comfortable with discharge and follow up instructions. Pt declines wheelchair, escorted to waiting area by this RN. Prescriptions x2. 

## 2014-06-23 NOTE — ED Provider Notes (Signed)
CSN: [id number]     Arrival date & time 06/23/14  1411 History   First MD Initiated Contact with Patient 06/23/14 1610     Chief Complaint  Patient presents with  . Back Pain  . Flank Pain  . Ankle Pain     (Consider location/radiation/quality/duration/timing/severity/associated sxs/prior Treatment) HPI Angela [name] is a 39 y.o. female who comes in for evaluation of left flank, left arm and left ankle discomfort. Patient states for the past 4 or 5 days she has had a constant, 8/10 pain in her left back. She cannot characterize the pain. She has tried 800 mg ibuprofen and Tylenol without relief. This discomfort does not radiate anywhere else. She denies any urinary symptoms. Last bowel movement was yesterday and was essentially normal. She also reports her left ankle seems more swollen than normal, but states that she stands all day as a hairdresser and and this happens from time to time. She also has discomfort over her left deltoid. She denies injury. She denies headache, changes in vision, chest pain, shortness of breath, cough, abdominal pain, nausea or vomiting, diarrhea or constipation, numbness or weakness.  States she has been out of blood pressure medication since December.  Past Medical History  Diagnosis Date  . Asthma   . Preterm labor   . Headache(784.0)   . Heart murmur   . Hypertension     on meds  . Depression     hx, doing ok now  . Ovarian cyst    Past Surgical History  Procedure Laterality Date  . Tubal ligation  1998   Family History  Problem Relation Age of Onset  . Hyperlipidemia Mother   . Cancer Son   . Other Neg Hx   . Hypertension Other   . Diabetes Other    History  Substance Use Topics  . Smoking status: Never Smoker   . Smokeless tobacco: Never Used  . Alcohol Use: No   OB History    Gravida Para Term Preterm AB TAB SAB Ectopic Multiple Living   7 5 3 2 2  0 2 0 0 5     Review of Systems  All other systems reviewed and are  negative. A 10 point review of systems was completed and was negative except for pertinent positives and negatives as mentioned in the history of present illness      Allergies  Doxycycline and Flagyl  Home Medications   Prior to Admission medications   Medication Sig Start Date End Date Taking? Authorizing Provider  acetaminophen (TYLENOL) 500 MG tablet Take 1,000 mg by mouth every 6 (six) hours as needed for moderate pain.   Yes Historical Provider, MD  albuterol (PROVENTIL HFA;VENTOLIN HFA) 108 (90 BASE) MCG/ACT inhaler Inhale 1-2 puffs into the lungs every 6 (six) hours as needed for wheezing or shortness of breath (wheezing).   Yes Historical Provider, MD  ibuprofen (ADVIL,MOTRIN) 600 MG tablet Take 1 tablet (600 mg total) by mouth every 6 (six) hours as needed. 03/30/14  Yes Lisa A Leftwich-Kirby, CNM  valsartan (DIOVAN) 80 MG tablet Take 1 tablet (80 mg total) by mouth daily. 03/30/14  Yes Lisa A Leftwich-Kirby, CNM  traMADol (ULTRAM) 50 MG tablet Take 1 tablet (50 mg total) by mouth every 6 (six) hours as needed. 06/23/14   Viona Gilmore Marwah Disbro, PA-C  triamterene-hydrochlorothiazide (MAXZIDE-25) 37.5-25 MG per tablet Take 1 tablet by mouth daily. 06/23/14   Viona Gilmore Marquise Wicke, PA-C   BP 141/90 mmHg  Pulse 73  Temp(Src) 98.4 F (36.9 C) (Oral)  Resp 18  Ht 5\' 1"  (1.549 m)  Wt 300 lb (136.079 kg)  BMI 56.71 kg/m2  SpO2 100%  LMP 06/08/2014 Physical Exam  Constitutional: She is oriented to person, place, and time. She appears well-developed and well-nourished.  Morbidly obese  HENT:  Head: Normocephalic and atraumatic.  Mouth/Throat: Oropharynx is clear and moist.  Eyes: Conjunctivae are normal. Pupils are equal, round, and reactive to light. Right eye exhibits no discharge. Left eye exhibits no discharge. No scleral icterus.  Neck: Normal range of motion. Neck supple.  Cardiovascular: Normal rate, regular rhythm and normal heart sounds.   Pulmonary/Chest: Effort normal and  breath sounds normal. No respiratory distress. She has no wheezes. She has no rales.  Abdominal: Soft. Bowel sounds are normal. She exhibits no distension and no mass. There is no tenderness. There is no rebound and no guarding.  Musculoskeletal: She exhibits no tenderness.  Tenderness to palpation of left lateral paraspinal thoracic muscles, latissimus dorsi. No obvious lesions, deformities, step-offs or crepitus noted. Mild discomfort with ranging left deltoid. However, she does maintain full active range of motion of bilateral upper extremities. There is no obvious erythema, edema or overt warmth. No other lesions or deformities noted. There is also mild swelling in left ankle is without erythema, warmth. Distal pulses intact. There is no calf tenderness.  Neurological: She is alert and oriented to person, place, and time.  Cranial Nerves II-XII grossly intact  Skin: Skin is warm and dry. No rash noted.  Psychiatric: She has a normal mood and affect.  Nursing note and vitals reviewed.   ED Course  Procedures (including critical care time) Labs Review Labs Reviewed  COMPREHENSIVE METABOLIC PANEL - Abnormal; Notable for the following:    Glucose, Bld 101 (*)    GFR calc non Af Amer 69 (*)    GFR calc Af Amer 81 (*)    All other components within normal limits  CBC WITH DIFFERENTIAL  LIPASE, BLOOD  URINALYSIS, ROUTINE W REFLEX MICROSCOPIC  PREGNANCY, URINE  I-STAT TROPOININ, ED    Imaging Review No results found.   EKG Interpretation None     Meds given in ED:  Medications - No data to display  Discharge Medication List as of 06/23/2014  5:37 PM    START taking these medications   Details  hydrochlorothiazide (HYDRODIURIL) 12.5 MG tablet Take 1 tablet (12.5 mg total) by mouth daily., Starting 06/23/2014, Until Discontinued, Print    traMADol (ULTRAM) 50 MG tablet Take 1 tablet (50 mg total) by mouth every 6 (six) hours as needed., Starting 06/23/2014, Until Discontinued,  Print       Filed Vitals:   06/23/14 1630 06/23/14 1700 06/23/14 1732 06/23/14 1800  BP: 132/98 141/100 150/96 141/90  Pulse: 76 76 79 73  Temp:      TempSrc:      Resp: 19 22 17 18   Height:      Weight:      SpO2: 96% 100% 100% 100%    MDM  Vitals stable - afebrile Pt resting comfortably in ED. refuses IV or IV medications. PE--discomfort reproducible on physical exam. Most consistent with musculoskeletal strain. Labwork noncontributory, no evidence of UTI or other systemic infection  Encouraged her to continue use of alternating Tylenol and ibuprofen. Will add tramadol for severe pain. Discussed follow-up with primary care, [place] and wellness for further evaluation and management of her symptoms. Low concern for any other acute or emergent  pathology that requires immediate intervention at this time.  I discussed all relevant lab findings and imaging results with pt and they verbalized understanding. Discussed f/u with PCP within 48 hrs and return precautions, pt very amenable to plan. Patient stable, in good condition and ambulates out of the ED without difficulty.  Final diagnoses:  Thoracic sprain, initial encounter        Verl Dicker, PA-C 06/23/14 7711  Fredia Sorrow, MD 06/24/14 [id number]

## 2014-06-23 NOTE — ED Notes (Signed)
Pt reports left sided back pain that radiates to left side. Also left ankle pain and is swollen. Denies injury or fall. Pt is a x 4. Reports trying to get in to doctor with medicaid, also is out of BP meds. Also has left arm pain for 4 days.

## 2014-07-01 ENCOUNTER — Emergency Department (HOSPITAL_COMMUNITY)
Admission: EM | Admit: 2014-07-01 | Discharge: 2014-07-02 | Discharge status: Against Medical Advice | Payer: Medicaid Other | Attending: Emergency Medicine | Admitting: Emergency Medicine

## 2014-07-01 DIAGNOSIS — M25512 Pain in left shoulder: Secondary | ICD-10-CM | POA: Diagnosis not present

## 2014-07-01 DIAGNOSIS — J45909 Unspecified asthma, uncomplicated: Secondary | ICD-10-CM | POA: Diagnosis not present

## 2014-07-01 DIAGNOSIS — R011 Cardiac murmur, unspecified: Secondary | ICD-10-CM | POA: Diagnosis not present

## 2014-07-01 DIAGNOSIS — I10 Essential (primary) hypertension: Secondary | ICD-10-CM | POA: Diagnosis not present

## 2014-07-01 NOTE — ED Notes (Signed)
Pt c/o pain to L shoulder and top of arm. Pt unable to raise L arm. Pt seen at [facility] for same on 1/21. Pt states that she was told it was her rotator cuff and is concerned that no x-rays were taken of her shoulder. Pt was given Tramadol and it didn't help. Pt states it feels like her L shoulder is swollen and warm to touch. Pt also took Tylenol today for symptoms. Pt alert, no acute distress. Skin warm and dry.

## 2014-07-02 NOTE — ED Notes (Signed)
No answer in WR or outside

## 2014-08-29 ENCOUNTER — Emergency Department (HOSPITAL_COMMUNITY)
Admission: EM | Admit: 2014-08-29 | Discharge: 2014-08-29 | Disposition: A | Discharge status: Home / Self Care | Payer: No Typology Code available for payment source | Attending: Emergency Medicine | Admitting: Emergency Medicine

## 2014-08-29 ENCOUNTER — Encounter (HOSPITAL_COMMUNITY): Payer: Self-pay | Admitting: Emergency Medicine

## 2014-08-29 DIAGNOSIS — W57XXXA Bitten or stung by nonvenomous insect and other nonvenomous arthropods, initial encounter: Secondary | ICD-10-CM | POA: Diagnosis not present

## 2014-08-29 DIAGNOSIS — Y9389 Activity, other specified: Secondary | ICD-10-CM | POA: Insufficient documentation

## 2014-08-29 DIAGNOSIS — Z8742 Personal history of other diseases of the female genital tract: Secondary | ICD-10-CM | POA: Insufficient documentation

## 2014-08-29 DIAGNOSIS — J45909 Unspecified asthma, uncomplicated: Secondary | ICD-10-CM | POA: Diagnosis not present

## 2014-08-29 DIAGNOSIS — Z79899 Other long term (current) drug therapy: Secondary | ICD-10-CM | POA: Diagnosis not present

## 2014-08-29 DIAGNOSIS — Y998 Other external cause status: Secondary | ICD-10-CM | POA: Diagnosis not present

## 2014-08-29 DIAGNOSIS — S00462A Insect bite (nonvenomous) of left ear, initial encounter: Secondary | ICD-10-CM | POA: Diagnosis present

## 2014-08-29 DIAGNOSIS — Y9289 Other specified places as the place of occurrence of the external cause: Secondary | ICD-10-CM | POA: Insufficient documentation

## 2014-08-29 DIAGNOSIS — X58XXXA Exposure to other specified factors, initial encounter: Secondary | ICD-10-CM | POA: Diagnosis not present

## 2014-08-29 DIAGNOSIS — T63484A Toxic effect of venom of other arthropod, undetermined, initial encounter: Secondary | ICD-10-CM

## 2014-08-29 DIAGNOSIS — R011 Cardiac murmur, unspecified: Secondary | ICD-10-CM | POA: Insufficient documentation

## 2014-08-29 DIAGNOSIS — F329 Major depressive disorder, single episode, unspecified: Secondary | ICD-10-CM | POA: Diagnosis not present

## 2014-08-29 DIAGNOSIS — I10 Essential (primary) hypertension: Secondary | ICD-10-CM | POA: Diagnosis not present

## 2014-08-29 MED ORDER — IBUPROFEN 800 MG PO TABS: 800.0000 mg | ORAL_TABLET | Freq: Three times a day (TID) | ORAL | Status: DC

## 2014-08-29 NOTE — ED Provider Notes (Signed)
CSN: [id number]     Arrival date & time 08/29/14  2145 History  This chart was scribed for non-physician practitioner, Charlann Lange, PA-C,working with Serita Grit, MD, by Marlowe Kays, ED Scribe. This patient was seen in room WTR7/WTR7 and the patient's care was started at 10:57 PM.  Chief Complaint  Patient presents with  . Otalgia   Patient is a 39 y.o. female presenting with ear pain. The history is provided by the patient and medical records. No language interpreter was used.  Otalgia Associated symptoms: no fever and no vomiting     HPI Comments:  Angela [name] is a 39 y.o. morbidly obese female who presents to the Emergency Department complaining of external left ear pain that began a couple of hours ago. Pt states she was going to [facility] and she believes a bug may have bitten her. She reports severe burning and moderate redness and mild swelling of the left ear. She did not see anything on her. She has been applying cold compresses to the area with mild relief of the symptoms. Denies alleviating factors. Denies fever, chills, nausea, vomiting. PMHx of asthma, heart murmur, HTN, ovarian cyst and depression.   Past Medical History  Diagnosis Date  . Asthma   . Preterm labor   . Headache(784.0)   . Heart murmur   . Hypertension     on meds  . Depression     hx, doing ok now  . Ovarian cyst    Past Surgical History  Procedure Laterality Date  . Tubal ligation  1998   Family History  Problem Relation Age of Onset  . Hyperlipidemia Mother   . Cancer Son   . Other Neg Hx   . Hypertension Other   . Diabetes Other    History  Substance Use Topics  . Smoking status: Never Smoker   . Smokeless tobacco: Never Used  . Alcohol Use: No   OB History    Gravida Para Term Preterm AB TAB SAB Ectopic Multiple Living   7 5 3 2 2  0 2 0 0 5     Review of Systems  Constitutional: Negative for fever and chills.  HENT: Positive for ear pain.   Gastrointestinal: Negative for  nausea and vomiting.  Skin: Positive for color change.    Allergies  Doxycycline and Flagyl  Home Medications   Prior to Admission medications   Medication Sig Start Date End Date Taking? Authorizing Provider  acetaminophen (TYLENOL) 500 MG tablet Take 1,000 mg by mouth every 6 (six) hours as needed for moderate pain or headache (headach).    Yes Historical Provider, MD  albuterol (PROVENTIL HFA;VENTOLIN HFA) 108 (90 BASE) MCG/ACT inhaler Inhale 1-2 puffs into the lungs every 6 (six) hours as needed for wheezing or shortness of breath (wheezing).   Yes Historical Provider, MD  triamterene-hydrochlorothiazide (MAXZIDE-25) 37.5-25 MG per tablet Take 1 tablet by mouth daily. 06/23/14  Yes Benjamin Cartner, PA-C  ibuprofen (ADVIL,MOTRIN) 600 MG tablet Take 1 tablet (600 mg total) by mouth every 6 (six) hours as needed. Patient not taking: Reported on 08/29/2014 03/30/14   Kathie Dike Leftwich-Kirby, CNM  traMADol (ULTRAM) 50 MG tablet Take 1 tablet (50 mg total) by mouth every 6 (six) hours as needed. Patient not taking: Reported on 08/29/2014 06/23/14   Comer Locket, PA-C  valsartan (DIOVAN) 80 MG tablet Take 1 tablet (80 mg total) by mouth daily. Patient not taking: Reported on 08/29/2014 03/30/14   Elvera Maria, CNM  Triage Vitals: BP 170/114 mmHg  Pulse 94  Temp(Src) 98.4 F (36.9 C) (Oral)  SpO2 100%  LMP 08/08/2014 Physical Exam  Constitutional: She is oriented to person, place, and time. She appears well-developed and well-nourished.  HENT:  Head: Normocephalic and atraumatic.  Right Ear: Tympanic membrane normal.  Left Ear: Tympanic membrane normal.  Left ear pinnix red and swollen. No ulceration, blister or area of drainage. Swelling extends to postauricular area.  Eyes: EOM are normal.  Neck: Normal range of motion.  Cardiovascular: Normal rate.   Pulmonary/Chest: Effort normal.  Musculoskeletal: Normal range of motion.  Neurological: She is alert and oriented to  person, place, and time.  Skin: Skin is warm and dry.  Psychiatric: She has a normal mood and affect. Her behavior is normal.  Nursing note and vitals reviewed.   ED Course  Procedures (including critical care time) DIAGNOSTIC STUDIES: Oxygen Saturation is 100% on RA, normal by my interpretation.   COORDINATION OF CARE: 11:00 PM- Will provide ice pack to patient. Will prescribe Benadryl and Ibuprofen. Pt verbalizes understanding and agrees to plan.  Medications - No data to display  Labs Review Labs Reviewed - No data to display  Imaging Review No results found.   EKG Interpretation None      MDM   Final diagnoses:  None    1. Insect bite, left ear  Localized, sudden onset pain and swelling follows pattern of insect bite/sting. Recommended supportive care.   I personally performed the services described in this documentation, which was scribed in my presence. The recorded information has been reviewed and is accurate.    Charlann Lange, PA-C 08/29/14 2311  Serita Grit, MD 08/31/14 [phone]

## 2014-08-29 NOTE — Discharge Instructions (Signed)
Cryotherapy °Cryotherapy means treatment with cold. Ice or gel packs can be used to reduce both pain and swelling. Ice is the most helpful within the first 24 to 48 hours after an injury or flare-up from overusing a muscle or joint. Sprains, strains, spasms, burning pain, shooting pain, and aches can all be eased with ice. Ice can also be used when recovering from surgery. Ice is effective, has very few side effects, and is safe for most people to use. °PRECAUTIONS  °Ice is not a safe treatment option for people with: °· Raynaud phenomenon. This is a condition affecting small blood vessels in the extremities. Exposure to cold may cause your problems to return. °· Cold hypersensitivity. There are many forms of cold hypersensitivity, including: °¨ Cold urticaria. Red, itchy hives appear on the skin when the tissues begin to warm after being iced. °¨ Cold erythema. This is a red, itchy rash caused by exposure to cold. °¨ Cold hemoglobinuria. Red blood cells break down when the tissues begin to warm after being iced. The hemoglobin that carry oxygen are passed into the urine because they cannot combine with blood proteins fast enough. °· Numbness or altered sensitivity in the area being iced. °If you have any of the following conditions, do not use ice until you have discussed cryotherapy with your caregiver: °· Heart conditions, such as arrhythmia, angina, or chronic heart disease. °· High blood pressure. °· Healing wounds or open skin in the area being iced. °· Current infections. °· Rheumatoid arthritis. °· Poor circulation. °· Diabetes. °Ice slows the blood flow in the region it is applied. This is beneficial when trying to stop inflamed tissues from spreading irritating chemicals to surrounding tissues. However, if you expose your skin to cold temperatures for too long or without the proper protection, you can damage your skin or nerves. Watch for signs of skin damage due to cold. °HOME CARE INSTRUCTIONS °Follow  these tips to use ice and cold packs safely. °· Place a dry or damp towel between the ice and skin. A damp towel will cool the skin more quickly, so you may need to shorten the time that the ice is used. °· For a more rapid response, add gentle compression to the ice. °· Ice for no more than 10 to 20 minutes at a time. The bonier the area you are icing, the less time it will take to get the benefits of ice. °· Check your skin after 5 minutes to make sure there are no signs of a poor response to cold or skin damage. °· Rest 20 minutes or more between uses. °· Once your skin is numb, you can end your treatment. You can test numbness by very lightly touching your skin. The touch should be so light that you do not see the skin dimple from the pressure of your fingertip. When using ice, most people will feel these normal sensations in this order: cold, burning, aching, and numbness. °· Do not use ice on someone who cannot communicate their responses to pain, such as small children or people with dementia. °HOW TO MAKE AN ICE PACK °Ice packs are the most common way to use ice therapy. Other methods include ice massage, ice baths, and cryosprays. Muscle creams that cause a cold, tingly feeling do not offer the same benefits that ice offers and should not be used as a substitute unless recommended by your caregiver. °To make an ice pack, do one of the following: °· Place crushed ice or a   bag of frozen vegetables in a sealable plastic bag. Squeeze out the excess air. Place this bag inside another plastic bag. Slide the bag into a pillowcase or place a damp towel between your skin and the bag. °· Mix 3 parts water with 1 part rubbing alcohol. Freeze the mixture in a sealable plastic bag. When you remove the mixture from the freezer, it will be slushy. Squeeze out the excess air. Place this bag inside another plastic bag. Slide the bag into a pillowcase or place a damp towel between your skin and the bag. °SEEK MEDICAL CARE  IF: °· You develop white spots on your skin. This may give the skin a blotchy (mottled) appearance. °· Your skin turns blue or pale. °· Your skin becomes waxy or hard. °· Your swelling gets worse. °MAKE SURE YOU:  °· Understand these instructions. °· Will watch your condition. °· Will get help right away if you are not doing well or get worse. °Document Released: 01/14/2011 Document Revised: 10/04/2013 Document Reviewed: 01/14/2011 °Exit[employer]® Patient Information ©2015 [employer]. This information is not intended to replace advice given to you by your health care provider. Make sure you discuss any questions you have with your health care provider. ° °

## 2014-08-29 NOTE — ED Notes (Signed)
Pt states that she felt like something bit her L ear at [facility] and now she has redness and swelling on that ear. Alert and oriented.

## 2014-10-02 ENCOUNTER — Encounter (HOSPITAL_COMMUNITY): Payer: Self-pay | Admitting: Emergency Medicine

## 2014-10-02 ENCOUNTER — Emergency Department (HOSPITAL_COMMUNITY)
Admission: EM | Admit: 2014-10-02 | Discharge: 2014-10-02 | Disposition: A | Discharge status: Home / Self Care | Payer: No Typology Code available for payment source | Attending: Emergency Medicine | Admitting: Emergency Medicine

## 2014-10-02 DIAGNOSIS — Z8742 Personal history of other diseases of the female genital tract: Secondary | ICD-10-CM | POA: Insufficient documentation

## 2014-10-02 DIAGNOSIS — R011 Cardiac murmur, unspecified: Secondary | ICD-10-CM | POA: Insufficient documentation

## 2014-10-02 DIAGNOSIS — I10 Essential (primary) hypertension: Secondary | ICD-10-CM | POA: Insufficient documentation

## 2014-10-02 DIAGNOSIS — J45909 Unspecified asthma, uncomplicated: Secondary | ICD-10-CM | POA: Diagnosis not present

## 2014-10-02 DIAGNOSIS — IMO0001 Reserved for inherently not codable concepts without codable children: Secondary | ICD-10-CM

## 2014-10-02 DIAGNOSIS — Z8751 Personal history of pre-term labor: Secondary | ICD-10-CM | POA: Diagnosis not present

## 2014-10-02 DIAGNOSIS — F329 Major depressive disorder, single episode, unspecified: Secondary | ICD-10-CM | POA: Diagnosis not present

## 2014-10-02 DIAGNOSIS — R519 Headache, unspecified: Secondary | ICD-10-CM

## 2014-10-02 DIAGNOSIS — R51 Headache: Secondary | ICD-10-CM | POA: Diagnosis not present

## 2014-10-02 DIAGNOSIS — Z79899 Other long term (current) drug therapy: Secondary | ICD-10-CM | POA: Insufficient documentation

## 2014-10-02 DIAGNOSIS — R609 Edema, unspecified: Secondary | ICD-10-CM | POA: Diagnosis not present

## 2014-10-02 DIAGNOSIS — R030 Elevated blood-pressure reading, without diagnosis of hypertension: Secondary | ICD-10-CM | POA: Insufficient documentation

## 2014-10-02 LAB — I-STAT CHEM 8, ED
BUN: 20 mg/dL (ref 6–20)
CHLORIDE: 108 mmol/L (ref 101–111)
Calcium, Ion: 1.22 mmol/L (ref 1.12–1.23)
Creatinine, Ser: 1.1 mg/dL — ABNORMAL HIGH (ref 0.44–1.00)
GLUCOSE: 85 mg/dL (ref 70–99)
HCT: 40 % (ref 36.0–46.0)
Hemoglobin: 13.6 g/dL (ref 12.0–15.0)
Potassium: 3.8 mmol/L (ref 3.5–5.1)
Sodium: 141 mmol/L (ref 135–145)
TCO2: 19 mmol/L (ref 0–100)

## 2014-10-02 MED ORDER — TRIAMTERENE-HCTZ 37.5-25 MG PO TABS
1.0000 | ORAL_TABLET | Freq: Every day | ORAL | Status: DC
  Administered 2014-10-02: 1 via ORAL
  Filled 2014-10-02: qty 1

## 2014-10-02 MED ORDER — TRIAMTERENE-HCTZ 37.5-25 MG PO TABS: 1.0000 | ORAL_TABLET | Freq: Every day | ORAL | Status: DC

## 2014-10-02 NOTE — ED Notes (Signed)
Patient states she developed a headache gradually three days ago. Headache is all over. Denies problems with vision. Increased generalized weakness. Pt reports numbness in tip of middle finger.

## 2014-10-02 NOTE — ED Provider Notes (Signed)
CSN: [id number]     Arrival date & time 10/02/14  1916 History   First MD Initiated Contact with Patient 10/02/14 1940     Chief Complaint  Patient presents with  . Headache  . Foot Swelling     (Consider location/radiation/quality/duration/timing/severity/associated sxs/prior Treatment) HPI Comments: Patient presents today with headache and bilateral lower extremity edema.  She states that symptoms have been present since she ran out of her Maxzide blood pressure medication three days ago.  She states that the headache was gradual in onset and is located all over her head.  She has taken Tylenol for the pain with mild relief.  She denies vision changes, focal weakness, difficulty speaking, numbness, tingling, chest pain, SOB, fever, or chills.  She does report that she has had lower extremity edema bilaterally over the past 3 days.  She reports that the swelling is worse when she stands for a prolonged amount of time.  She denies any erythema of the lower extremities.  No prolonged travel or surgery in the past 4 weeks.  No history of DVT.  No exogenous estrogen use.    Patient is a 39 y.o. female presenting with headaches. The history is provided by the patient.  Headache   Past Medical History  Diagnosis Date  . Asthma   . Preterm labor   . Headache(784.0)   . Heart murmur   . Hypertension     on meds  . Depression     hx, doing ok now  . Ovarian cyst    Past Surgical History  Procedure Laterality Date  . Tubal ligation  1998   Family History  Problem Relation Age of Onset  . Hyperlipidemia Mother   . Cancer Son   . Other Neg Hx   . Hypertension Other   . Diabetes Other    History  Substance Use Topics  . Smoking status: Never Smoker   . Smokeless tobacco: Never Used  . Alcohol Use: No   OB History    Gravida Para Term Preterm AB TAB SAB Ectopic Multiple Living   7 5 3 2 2  0 2 0 0 5     Review of Systems  Neurological: Positive for headaches.  All other systems  reviewed and are negative.     Allergies  Doxycycline and Flagyl  Home Medications   Prior to Admission medications   Medication Sig Start Date End Date Taking? Authorizing Provider  acetaminophen (TYLENOL) 500 MG tablet Take 1,000 mg by mouth every 6 (six) hours as needed for moderate pain or headache (headach).    Yes Historical Provider, MD  albuterol (PROVENTIL HFA;VENTOLIN HFA) 108 (90 BASE) MCG/ACT inhaler Inhale 1-2 puffs into the lungs every 6 (six) hours as needed for wheezing or shortness of breath (wheezing).   Yes Historical Provider, MD  triamterene-hydrochlorothiazide (MAXZIDE-25) 37.5-25 MG per tablet Take 1 tablet by mouth daily. 06/23/14  Yes Benjamin Cartner, PA-C  ibuprofen (ADVIL,MOTRIN) 800 MG tablet Take 1 tablet (800 mg total) by mouth 3 (three) times daily. Patient not taking: Reported on 10/02/2014 08/29/14   Charlann Lange, PA-C  traMADol (ULTRAM) 50 MG tablet Take 1 tablet (50 mg total) by mouth every 6 (six) hours as needed. Patient not taking: Reported on 08/29/2014 06/23/14   Comer Locket, PA-C  valsartan (DIOVAN) 80 MG tablet Take 1 tablet (80 mg total) by mouth daily. Patient not taking: Reported on 08/29/2014 03/30/14   Kathie Dike Leftwich-Kirby, CNM   BP 157/103 mmHg  Pulse 99  Temp(Src) 98.6 F (37 C) (Oral)  Resp 20  SpO2 99%  LMP 09/08/2014 Physical Exam  Constitutional: She appears well-developed and well-nourished.  HENT:  Head: Normocephalic and atraumatic.  Mouth/Throat: Oropharynx is clear and moist.  Neck: Normal range of motion. Neck supple.  Cardiovascular: Normal rate, regular rhythm and normal heart sounds.   Pulmonary/Chest: Effort normal and breath sounds normal.  Musculoskeletal:  No pitting edema of lower extremities.  No erythema or warmth of lower extremities bilaterally  Neurological: She is alert. She has normal strength. No cranial nerve deficit or sensory deficit. Coordination and gait normal.  Skin: Skin is warm and dry.   Psychiatric: She has a normal mood and affect.  Nursing note and vitals reviewed.   ED Course  Procedures (including critical care time) Labs Review Labs Reviewed  I-STAT CHEM 8, ED - Abnormal; Notable for the following:    Creatinine, Ser 1.10 (*)    All other components within normal limits    Imaging Review No results found.   EKG Interpretation None      MDM   Final diagnoses:  None   Patient presents today with headache and bilateral lower extremities bilaterally.  She has been out of her Maxzide blood pressure medication for the past 3 days.  She has a normal neurological exam.  No vision changes.  She denies any chest pain or SOB.  Labs showing a Creatine of 1.1.  No signs of hypertensive emergency.  Patient given her home dose of Maxzide in the ED and discharged home with a prescription.  Patient also given a referral to [facility].  Return precautions given.    Hyman Bible, PA-C 10/02/14 2210  Dorie Rank, MD 10/03/14 [phone]

## 2014-10-02 NOTE — ED Notes (Signed)
Pt from home c/o generalized headache x few days. She reports she has been out of her BP meds. She also c/o bilateral foot edema with pain while weight bearing.

## 2014-10-02 NOTE — ED Notes (Signed)
When entering the room, pt using IPOD with ear phones in her ears.

## 2014-10-02 NOTE — ED Notes (Signed)
Pt reports increased lower extremity swelling with no pitting edema. Pt has strong pulses in feet. Cap refill less than 3 seconds. Pt reports she is experiencing bilateral calf pain and weakness.

## 2014-10-02 NOTE — Discharge Instructions (Signed)
It is important for you to take your blood pressure medications as directed.

## 2014-11-14 ENCOUNTER — Encounter (HOSPITAL_COMMUNITY): Payer: Self-pay | Admitting: Emergency Medicine

## 2014-11-14 ENCOUNTER — Emergency Department (INDEPENDENT_AMBULATORY_CARE_PROVIDER_SITE_OTHER)
Admission: EM | Admit: 2014-11-14 | Discharge: 2014-11-14 | Disposition: A | Discharge status: Home / Self Care | Payer: No Typology Code available for payment source | Source: Home / Self Care | Attending: Family Medicine | Admitting: Family Medicine

## 2014-11-14 DIAGNOSIS — L03317 Cellulitis of buttock: Secondary | ICD-10-CM | POA: Diagnosis not present

## 2014-11-14 MED ORDER — FLUCONAZOLE 150 MG PO TABS: 150.0000 mg | ORAL_TABLET | Freq: Once | ORAL | Status: DC

## 2014-11-14 MED ORDER — SULFAMETHOXAZOLE-TRIMETHOPRIM 800-160 MG PO TABS: 2.0000 | ORAL_TABLET | Freq: Two times a day (BID) | ORAL | Status: DC

## 2014-11-14 MED ORDER — CEPHALEXIN 500 MG PO CAPS: 500.0000 mg | ORAL_CAPSULE | Freq: Four times a day (QID) | ORAL | Status: DC

## 2014-11-14 NOTE — ED Notes (Signed)
Pt reports poss insect bite to left hip since yest Sx include swelling, localized fever, pain Denies fevers, chills Alert, no signs of acute distress.

## 2014-11-14 NOTE — Discharge Instructions (Signed)
Thank you for coming in today. Take Bactrim anabiotic twice daily. Take Keflex and about X4 times daily. Use fluconazole if you develop a yeast infection. Continue Benadryl as needed. Return as needed.    Cellulitis Cellulitis is an infection of the skin and the tissue beneath it. The infected area is usually red and tender. Cellulitis occurs most often in the arms and lower legs.  CAUSES  Cellulitis is caused by bacteria that enter the skin through cracks or cuts in the skin. The most common types of bacteria that cause cellulitis are staphylococci and streptococci. SIGNS AND SYMPTOMS   Redness and warmth.  Swelling.  Tenderness or pain.  Fever. DIAGNOSIS  Your health care provider can usually determine what is wrong based on a physical exam. Blood tests may also be done. TREATMENT  Treatment usually involves taking an antibiotic medicine. HOME CARE INSTRUCTIONS   Take your antibiotic medicine as directed by your health care provider. Finish the antibiotic even if you start to feel better.  Keep the infected arm or leg elevated to reduce swelling.  Apply a warm cloth to the affected area up to 4 times per day to relieve pain.  Take medicines only as directed by your health care provider.  Keep all follow-up visits as directed by your health care provider. SEEK MEDICAL CARE IF:   You notice red streaks coming from the infected area.  Your red area gets larger or turns dark in color.  Your bone or joint underneath the infected area becomes painful after the skin has healed.  Your infection returns in the same area or another area.  You notice a swollen bump in the infected area.  You develop new symptoms.  You have a fever. SEEK IMMEDIATE MEDICAL CARE IF:   You feel very sleepy.  You develop vomiting or diarrhea.  You have a general ill feeling (malaise) with muscle aches and pains. MAKE SURE YOU:   Understand these instructions.  Will watch your  condition.  Will get help right away if you are not doing well or get worse. Document Released: 02/27/2005 Document Revised: 10/04/2013 Document Reviewed: 08/05/2011 [facility] Patient Information 2015 [place], [place]. This information is not intended to replace advice given to you by your health care provider. Make sure you discuss any questions you have with your health care provider.

## 2014-11-14 NOTE — ED Provider Notes (Signed)
Angela [name] is a 39 y.o. female who presents to Urgent Care today for pain on the left flank. Patient notes significant redness and tenderness along her left buttocks side. It's been present for one day. She suspects that she was bitten by a bug but cannot recall any specific bug bite. Symptoms are worsening. No fevers or chills. She notes that she frequently gets yeast infections after taking anti-biotics.   Past Medical History  Diagnosis Date  . Asthma   . Preterm labor   . Headache(784.0)   . Heart murmur   . Hypertension     on meds  . Depression     hx, doing ok now  . Ovarian cyst    Past Surgical History  Procedure Laterality Date  . Tubal ligation  1998   History  Substance Use Topics  . Smoking status: Never Smoker   . Smokeless tobacco: Never Used  . Alcohol Use: No   ROS as above Medications: No current facility-administered medications for this encounter.   Current Outpatient Prescriptions  Medication Sig Dispense Refill  . acetaminophen (TYLENOL) 500 MG tablet Take 1,000 mg by mouth every 6 (six) hours as needed for moderate pain or headache (headach).     Marland Kitchen albuterol (PROVENTIL HFA;VENTOLIN HFA) 108 (90 BASE) MCG/ACT inhaler Inhale 1-2 puffs into the lungs every 6 (six) hours as needed for wheezing or shortness of breath (wheezing).    . cephALEXin (KEFLEX) 500 MG capsule Take 1 capsule (500 mg total) by mouth 4 (four) times daily. 28 capsule 0  . fluconazole (DIFLUCAN) 150 MG tablet Take 1 tablet (150 mg total) by mouth once. 1 tablet 1  . sulfamethoxazole-trimethoprim (BACTRIM DS,SEPTRA DS) 800-160 MG per tablet Take 2 tablets by mouth 2 (two) times daily. 28 tablet 0  . triamterene-hydrochlorothiazide (MAXZIDE-25) 37.5-25 MG per tablet Take 1 tablet by mouth daily. 30 tablet 0  . [DISCONTINUED] hydrochlorothiazide (HYDRODIURIL) 12.5 MG tablet Take 1 tablet (12.5 mg total) by mouth daily. 30 tablet 1  . [DISCONTINUED] valsartan (DIOVAN) 80 MG tablet Take 1  tablet (80 mg total) by mouth daily. (Patient not taking: Reported on 08/29/2014) 30 tablet 5   Allergies  Allergen Reactions  . Doxycycline Nausea And Vomiting  . Flagyl [Metronidazole Hcl] Hives and Nausea And Vomiting     Exam:  BP 136/97 mmHg  Pulse 106  Temp(Src) 98.7 F (37.1 C) (Oral)  Resp 16  SpO2 97% Gen: Well NAD HEENT: EOMI,  MMM Lungs: Normal work of breathing. CTABL Heart: RRR no MRG Abd: NABS, Soft. Nondistended, Nontender Exts: Brisk capillary refill, warm and well perfused.  Skin: Large 6 x 8 cm indurated erythematous patch left buttocks and side. No fluctuance palpated.  No results found for this or any previous visit (from the past 24 hour(s)). No results found.  Assessment and Plan: 39 y.o. female with cellulitis likely. Treat with Bactrim and Keflex. Fluconazole in case patient develops yeast infection.  Discussed warning signs or symptoms. Please see discharge instructions. Patient expresses understanding.     Gregor Hams, MD 11/14/14 [phone]

## 2015-02-17 ENCOUNTER — Encounter (HOSPITAL_COMMUNITY): Payer: Self-pay | Admitting: *Deleted

## 2015-02-17 ENCOUNTER — Emergency Department (HOSPITAL_COMMUNITY)
Admission: EM | Admit: 2015-02-17 | Discharge: 2015-02-17 | Disposition: A | Discharge status: Home / Self Care | Payer: No Typology Code available for payment source | Attending: Emergency Medicine | Admitting: Emergency Medicine

## 2015-02-17 DIAGNOSIS — J45909 Unspecified asthma, uncomplicated: Secondary | ICD-10-CM | POA: Insufficient documentation

## 2015-02-17 DIAGNOSIS — R011 Cardiac murmur, unspecified: Secondary | ICD-10-CM | POA: Diagnosis not present

## 2015-02-17 DIAGNOSIS — Z8659 Personal history of other mental and behavioral disorders: Secondary | ICD-10-CM | POA: Diagnosis not present

## 2015-02-17 DIAGNOSIS — R51 Headache: Secondary | ICD-10-CM | POA: Diagnosis present

## 2015-02-17 DIAGNOSIS — Z8751 Personal history of pre-term labor: Secondary | ICD-10-CM | POA: Insufficient documentation

## 2015-02-17 DIAGNOSIS — J0110 Acute frontal sinusitis, unspecified: Secondary | ICD-10-CM | POA: Diagnosis not present

## 2015-02-17 DIAGNOSIS — Z792 Long term (current) use of antibiotics: Secondary | ICD-10-CM | POA: Insufficient documentation

## 2015-02-17 DIAGNOSIS — I10 Essential (primary) hypertension: Secondary | ICD-10-CM | POA: Insufficient documentation

## 2015-02-17 DIAGNOSIS — Z7951 Long term (current) use of inhaled steroids: Secondary | ICD-10-CM | POA: Diagnosis not present

## 2015-02-17 DIAGNOSIS — Z8742 Personal history of other diseases of the female genital tract: Secondary | ICD-10-CM | POA: Insufficient documentation

## 2015-02-17 DIAGNOSIS — Z79899 Other long term (current) drug therapy: Secondary | ICD-10-CM | POA: Insufficient documentation

## 2015-02-17 MED ORDER — FLUTICASONE PROPIONATE 50 MCG/ACT NA SUSP: 2.0000 | Freq: Every day | NASAL | Status: DC

## 2015-02-17 MED ORDER — TRIAMTERENE-HCTZ 37.5-25 MG PO TABS: 1.0000 | ORAL_TABLET | Freq: Every day | ORAL | Status: DC

## 2015-02-17 NOTE — ED Notes (Signed)
Pt reports frontal h/a and R knee pain x 2 days.  Pt reports hx of sinus h/a in the past.  Pt denies injury to R knee.

## 2015-02-17 NOTE — ED Provider Notes (Signed)
CSN: [id number]     Arrival date & time 02/17/15  1228 History  This chart was scribed for Angela Sink, PA-C, working with Angela Rank, MD by Steva Colder, ED Scribe. The patient was seen in room WTR5/WTR5 at 1:20 PM.    Chief Complaint  Patient presents with  . Headache      The history is provided by the patient. No language interpreter was used.    Angela [name] is a 39 y.o. female with a PMHx of HA with her last being several years ago and asthma who presents to the Emergency Department complaining of frontal HA onset 4 days. He notes that he has had sinus HA in the past that has been treated with medications. Pt is having associated symptoms of congestion, rhinorrhea x 4 days with thick sputum and some blood, watery eyes, hearing loss, mild cough, frontal facial pain, wheezing at night. She notes that she does have allergies. She denies having a PCP at this time due to insurance issues and she hasn't had a PCP since 07/2014. She denies fever, chills, n/v, and any other symptoms. Denies sick contacts.   Pt secondarily complains of right knee pain/swelling onset 2 days. She denies any injury to the right knee. She notes there is pain when she sits long period of time, ambulation, and movement. She denies lower leg pain. She also voices concerns for her HTN medications that she has been out of x 2 weeks. She notes that she takes HCTZ for her HTN.    Past Medical History  Diagnosis Date  . Asthma   . Preterm labor   . Headache(784.0)   . Heart murmur   . Hypertension     on meds  . Depression     hx, doing ok now  . Ovarian cyst    Past Surgical History  Procedure Laterality Date  . Tubal ligation  1998   Family History  Problem Relation Age of Onset  . Hyperlipidemia Mother   . Cancer Son   . Other Neg Hx   . Hypertension Other   . Diabetes Other    Social History  Substance Use Topics  . Smoking status: Never Smoker   . Smokeless tobacco: Never Used  . Alcohol Use: No    OB History    Gravida Para Term Preterm AB TAB SAB Ectopic Multiple Living   7 5 3 2 2  0 2 0 0 5     Review of Systems  All other systems reviewed and are negative.  Allergies  Doxycycline and Flagyl  Home Medications   Prior to Admission medications   Medication Sig Start Date End Date Taking? Authorizing Provider  acetaminophen (TYLENOL) 500 MG tablet Take 1,000 mg by mouth every 6 (six) hours as needed for moderate pain or headache (headach).     Historical Provider, MD  albuterol (PROVENTIL HFA;VENTOLIN HFA) 108 (90 BASE) MCG/ACT inhaler Inhale 1-2 puffs into the lungs every 6 (six) hours as needed for wheezing or shortness of breath (wheezing).    Historical Provider, MD  cephALEXin (KEFLEX) 500 MG capsule Take 1 capsule (500 mg total) by mouth 4 (four) times daily. 11/14/14   Gregor Hams, MD  fluconazole (DIFLUCAN) 150 MG tablet Take 1 tablet (150 mg total) by mouth once. 11/14/14   Gregor Hams, MD  fluticasone (FLONASE) 50 MCG/ACT nasal spray Place 2 sprays into both nostrils daily. 02/17/15   Okey Regal, PA-C  sulfamethoxazole-trimethoprim (BACTRIM DS,SEPTRA DS) 800-160 MG  per tablet Take 2 tablets by mouth 2 (two) times daily. 11/14/14   Gregor Hams, MD  triamterene-hydrochlorothiazide (MAXZIDE-25) 37.5-25 MG per tablet Take 1 tablet by mouth daily. 02/17/15   Jeffrey Hedges, PA-C   BP 152/102 mmHg  Temp(Src) 98.2 F (36.8 C) (Oral)  Resp 18  SpO2 100%  LMP 01/26/2015   Physical Exam  Constitutional: She is oriented to person, place, and time. She appears well-developed and well-nourished. No distress.  HENT:  Head: Normocephalic.  Right Ear: Tympanic membrane, external ear and ear canal normal.  Left Ear: Tympanic membrane, external ear and ear canal normal.  Nose: Rhinorrhea present. Right sinus exhibits no maxillary sinus tenderness and no frontal sinus tenderness. Left sinus exhibits no maxillary sinus tenderness and no frontal sinus tenderness.  Congestion to  the left nares that wasn't patent.  Eyes: Conjunctivae and EOM are normal. Pupils are equal, round, and reactive to light. Right eye exhibits no discharge. Left eye exhibits no discharge. No scleral icterus.  Neck: Normal range of motion. Neck supple. No JVD present.  Cardiovascular: Normal rate, regular rhythm and normal heart sounds.  Exam reveals no gallop and no friction rub.   No murmur heard. Pulmonary/Chest: Effort normal and breath sounds normal. No stridor. No respiratory distress. She has no wheezes. She has no rales.  Musculoskeletal: Normal range of motion. She exhibits no edema.       Right knee: She exhibits normal range of motion and no swelling. Tenderness found.  Difficult to exam due to body habitus. TTP of right anterior knee with no obvious swelling or warmth to touch. Full active ROM. Ambulates without difficulty.  Lymphadenopathy:    She has no cervical adenopathy.  Neurological: She is alert and oriented to person, place, and time. She has normal strength. She displays no atrophy and no tremor. No cranial nerve deficit or sensory deficit. She exhibits normal muscle tone. She displays a negative Romberg sign. She displays no seizure activity. Coordination and gait normal. GCS eye subscore is 4. GCS verbal subscore is 5. GCS motor subscore is 6.  Reflex Scores:      Patellar reflexes are 2+ on the right side and 2+ on the left side. Skin: She is not diaphoretic.  Nursing note and vitals reviewed.   ED Course  Procedures (including critical care time) Labs Review Labs Reviewed - No data to display  Imaging Review No results found. I have personally reviewed and evaluated these images and lab results as part of my medical decision-making.   EKG Interpretation None      MDM   Final diagnoses:  Acute frontal sinusitis, recurrence not specified    Labs:   Imaging:  Consults:  Therapeutics:  Discharge Meds: flonase Rx and Maxzide   Assessment/Plan:  patient's presentation most likely represents acute sinusitis, this is likely viral nature due to physical exam, history, duration of symptoms. Patient will be instructed to use Flonase,nasal saline rinses, follow up with primary care if symptoms continue to persist. Patient also has complaints of right knee pain, no obvious trauma to the knee, due to body habitus difficult exam, no warmth to touch, patient improved without difficulty, unlikely to be septic or gouty arthritis. He should also reports that she has not taken her blood pressure medication, she was only mildly hypertensive at today's visit, no acute concerns for long-standing elevated hypertension or end organ damage at this time, she'll be given her blood pressure medication encouraged to immediately follow up with primary  care for further evaluation and management.patient given strict return precautions.   I personally performed the services described in this documentation, which was scribed in my presence. The recorded information has been reviewed and is accurate.   Okey Regal, PA-C 02/17/15 1607  Angela Rank, MD 02/20/15 [phone]

## 2015-02-17 NOTE — Discharge Instructions (Signed)
Sinusitis Sinusitis is redness, soreness, and puffiness (inflammation) of the air pockets in the bones of your face (sinuses). The redness, soreness, and puffiness can cause air and mucus to get trapped in your sinuses. This can allow germs to grow and cause an infection.  HOME CARE   Drink enough fluids to keep your pee (urine) clear or pale yellow.  Use a humidifier in your home.  Run a hot shower to create steam in the bathroom. Sit in the bathroom with the door closed. Breathe in the steam 3-4 times a day.  Put a warm, moist washcloth on your face 3-4 times a day, or as told by your doctor.  Use salt water sprays (saline sprays) to wet the thick fluid in your nose. This can help the sinuses drain.  Only take medicine as told by your doctor. GET HELP RIGHT AWAY IF:   Your pain gets worse.  You have very bad headaches.  You are sick to your stomach (nauseous).  You throw up (vomit).  You are very sleepy (drowsy) all the time.  Your face is puffy (swollen).  Your vision changes.  You have a stiff neck.  You have trouble breathing. MAKE SURE YOU:   Understand these instructions.  Will watch your condition.  Will get help right away if you are not doing well or get worse. Document Released: 11/06/2007 Document Revised: 02/12/2012 Document Reviewed: 12/24/2011 [facility] Patient Information 2015 [place], [place]. This information is not intended to replace advice given to you by your health care provider. Make sure you discuss any questions you have with your health care provider.  Please follow-up with [facility] for further evaluation. Please take her medication as directed.

## 2015-03-28 ENCOUNTER — Encounter (HOSPITAL_COMMUNITY): Payer: Self-pay | Admitting: Emergency Medicine

## 2015-03-28 ENCOUNTER — Emergency Department (INDEPENDENT_AMBULATORY_CARE_PROVIDER_SITE_OTHER)
Admission: EM | Admit: 2015-03-28 | Discharge: 2015-03-28 | Disposition: A | Discharge status: Home / Self Care | Payer: No Typology Code available for payment source | Source: Home / Self Care | Attending: Emergency Medicine | Admitting: Emergency Medicine

## 2015-03-28 ENCOUNTER — Emergency Department (INDEPENDENT_AMBULATORY_CARE_PROVIDER_SITE_OTHER): Payer: No Typology Code available for payment source

## 2015-03-28 DIAGNOSIS — S29011A Strain of muscle and tendon of front wall of thorax, initial encounter: Secondary | ICD-10-CM

## 2015-03-28 DIAGNOSIS — S2341XA Sprain of ribs, initial encounter: Secondary | ICD-10-CM | POA: Diagnosis not present

## 2015-03-28 MED ORDER — IBUPROFEN 600 MG PO TABS: 600.0000 mg | ORAL_TABLET | Freq: Four times a day (QID) | ORAL | Status: DC | PRN

## 2015-03-28 MED ORDER — HYDROCODONE-HOMATROPINE 5-1.5 MG/5ML PO SYRP: 5.0000 mL | ORAL_SOLUTION | Freq: Four times a day (QID) | ORAL | Status: DC | PRN

## 2015-03-28 NOTE — ED Notes (Signed)
Pt has left mid back pain that started today that hurts when she takes a deep breath.  Pt has had a cough and nasal and congestion for several days.

## 2015-03-28 NOTE — ED Provider Notes (Signed)
CSN: [id number]     Arrival date & time 03/28/15  1854 History   First MD Initiated Contact with Patient 03/28/15 1937     Chief Complaint  Patient presents with  . Back Pain  . Cough   (Consider location/radiation/quality/duration/timing/severity/associated sxs/prior Treatment) HPI  She is a 39 year old woman here for evaluation of left mid back pain. She states this started today. It is located in her left mid back and wraps around to the left side. It is present with movement and deep breaths. No associated shortness of breath or chest pain. No diaphoresis or dizziness. No hormone supplements or recent immobilization. She reports chronic lower extremity edema, but no changes. She has had several days of mild nasal congestion and cough. No fevers or chills. No known sick contacts. No nausea or vomiting.  Past Medical History  Diagnosis Date  . Asthma   . Preterm labor   . Headache(784.0)   . Heart murmur   . Hypertension     on meds  . Depression     hx, doing ok now  . Ovarian cyst    Past Surgical History  Procedure Laterality Date  . Tubal ligation  1998   Family History  Problem Relation Age of Onset  . Hyperlipidemia Mother   . Cancer Son   . Other Neg Hx   . Hypertension Other   . Diabetes Other    Social History  Substance Use Topics  . Smoking status: Never Smoker   . Smokeless tobacco: Never Used  . Alcohol Use: No   OB History    Gravida Para Term Preterm AB TAB SAB Ectopic Multiple Living   7 5 3 2 2  0 2 0 0 5     Review of Systems As in history of present illness Allergies  Doxycycline and Flagyl  Home Medications   Prior to Admission medications   Medication Sig Start Date End Date Taking? Authorizing Provider  triamterene-hydrochlorothiazide (MAXZIDE-25) 37.5-25 MG per tablet Take 1 tablet by mouth daily. 02/17/15  Yes Jeffrey Hedges, PA-C  acetaminophen (TYLENOL) 500 MG tablet Take 1,000 mg by mouth every 6 (six) hours as needed for moderate  pain or headache (headach).     Historical Provider, MD  albuterol (PROVENTIL HFA;VENTOLIN HFA) 108 (90 BASE) MCG/ACT inhaler Inhale 1-2 puffs into the lungs every 6 (six) hours as needed for wheezing or shortness of breath (wheezing).    Historical Provider, MD  cephALEXin (KEFLEX) 500 MG capsule Take 1 capsule (500 mg total) by mouth 4 (four) times daily. 11/14/14   Gregor Hams, MD  fluconazole (DIFLUCAN) 150 MG tablet Take 1 tablet (150 mg total) by mouth once. 11/14/14   Gregor Hams, MD  fluticasone (FLONASE) 50 MCG/ACT nasal spray Place 2 sprays into both nostrils daily. 02/17/15   Dellis Filbert Hedges, PA-C  HYDROcodone-homatropine (HYCODAN) 5-1.5 MG/5ML syrup Take 5 mLs by mouth every 6 (six) hours as needed for cough. 03/28/15   Melony Overly, MD  ibuprofen (ADVIL,MOTRIN) 600 MG tablet Take 1 tablet (600 mg total) by mouth every 6 (six) hours as needed. 03/28/15   Melony Overly, MD  sulfamethoxazole-trimethoprim (BACTRIM DS,SEPTRA DS) 800-160 MG per tablet Take 2 tablets by mouth 2 (two) times daily. 11/14/14   Gregor Hams, MD   Meds Ordered and Administered this Visit  Medications - No data to display  BP 134/99 mmHg  Pulse 86  Temp(Src) 98.5 F (36.9 C) (Oral)  Resp 20  SpO2 100%  LMP 03/22/2015 (Exact Date) No data found.   Physical Exam  Constitutional: She is oriented to person, place, and time. She appears well-developed and well-nourished. No distress.  Morbidly obese  Neck: Neck supple.  Cardiovascular: Normal rate, regular rhythm and normal heart sounds.   No murmur heard. Pulmonary/Chest: Effort normal and breath sounds normal. No respiratory distress. She has no wheezes. She has no rales.    Neurological: She is alert and oriented to person, place, and time.    ED Course  Procedures (including critical care time)  Labs Review Labs Reviewed - No data to display  Imaging Review Dg Chest 2 View  03/28/2015  CLINICAL DATA:  39 year old female with upper left back pain  while taking deep breaths. Mild cough. EXAM: CHEST  2 VIEW COMPARISON:  Chest x-ray 08/10/2013. FINDINGS: Lung volumes are normal. No consolidative airspace disease. No pleural effusions. No pneumothorax. No pulmonary nodule or mass noted. Pulmonary vasculature and the cardiomediastinal silhouette are within normal limits. IMPRESSION: No radiographic evidence of acute cardiopulmonary disease. Electronically Signed   By: Vinnie Langton M.D.   On: 03/28/2015 20:17      MDM   1. Intercostal muscle strain, initial encounter    PERC negative. Chest x-ray negative for pneumonia. This is likely intercostal muscle strain. Treat with ice and ibuprofen. Hycodan as needed for cough and pain. Follow-up as needed.    Melony Overly, MD 03/28/15 2025

## 2015-03-28 NOTE — Discharge Instructions (Signed)
Your pain is coming from a pulled muscle in the rib cage. Take Hycodan every 4-6 hours as needed. This will help with the cough as well as the pain. Do not drive while taking this medicine. Take ibuprofen 600 mg 4 times a day for the next 3 days, then as needed. Apply ice to the area 3 times a day for the next 3 days. This should gradually improve over the next week. Follow-up as needed.

## 2015-05-19 ENCOUNTER — Emergency Department (HOSPITAL_COMMUNITY): Payer: No Typology Code available for payment source

## 2015-05-19 ENCOUNTER — Emergency Department (HOSPITAL_COMMUNITY)
Admission: EM | Admit: 2015-05-19 | Discharge: 2015-05-19 | Disposition: A | Discharge status: Home / Self Care | Payer: No Typology Code available for payment source | Attending: Emergency Medicine | Admitting: Emergency Medicine

## 2015-05-19 ENCOUNTER — Encounter (HOSPITAL_COMMUNITY): Payer: Self-pay | Admitting: Emergency Medicine

## 2015-05-19 DIAGNOSIS — R059 Cough, unspecified: Secondary | ICD-10-CM

## 2015-05-19 DIAGNOSIS — Z8742 Personal history of other diseases of the female genital tract: Secondary | ICD-10-CM | POA: Diagnosis not present

## 2015-05-19 DIAGNOSIS — J45901 Unspecified asthma with (acute) exacerbation: Secondary | ICD-10-CM | POA: Diagnosis not present

## 2015-05-19 DIAGNOSIS — I10 Essential (primary) hypertension: Secondary | ICD-10-CM | POA: Diagnosis not present

## 2015-05-19 DIAGNOSIS — R011 Cardiac murmur, unspecified: Secondary | ICD-10-CM | POA: Insufficient documentation

## 2015-05-19 DIAGNOSIS — Z8659 Personal history of other mental and behavioral disorders: Secondary | ICD-10-CM | POA: Insufficient documentation

## 2015-05-19 DIAGNOSIS — Z7951 Long term (current) use of inhaled steroids: Secondary | ICD-10-CM | POA: Insufficient documentation

## 2015-05-19 DIAGNOSIS — H9209 Otalgia, unspecified ear: Secondary | ICD-10-CM | POA: Diagnosis not present

## 2015-05-19 DIAGNOSIS — R05 Cough: Secondary | ICD-10-CM | POA: Diagnosis present

## 2015-05-19 DIAGNOSIS — Z79899 Other long term (current) drug therapy: Secondary | ICD-10-CM | POA: Insufficient documentation

## 2015-05-19 MED ORDER — PREDNISONE 20 MG PO TABS: ORAL_TABLET | ORAL | Status: DC

## 2015-05-19 MED ORDER — PROMETHAZINE-DM 6.25-15 MG/5ML PO SYRP: 5.0000 mL | ORAL_SOLUTION | Freq: Four times a day (QID) | ORAL | Status: DC | PRN

## 2015-05-19 NOTE — Discharge Instructions (Signed)

## 2015-05-19 NOTE — ED Notes (Signed)
Pt able to ambulate independently 

## 2015-05-19 NOTE — ED Notes (Signed)
Pt c/o cough, productive for brown/yellow sputum. Chest hurts to cough, voice hoarse. No OTC helping. Chilled at home.

## 2015-05-19 NOTE — ED Provider Notes (Signed)
CSN: [id number]     Arrival date & time 05/19/15  1357 History  By signing my name below, I, Angela [name], attest that this documentation has been prepared under the direction and in the presence of Angela Moras, PA-C. Electronically Signed: Starleen [name] ED Scribe. 05/19/2015. 3:50 PM.    Chief Complaint  Patient presents with  . URI  . Cough   Patient is a 39 y.o. female presenting with cough. The history is provided by the patient. No language interpreter was used.  Cough Associated symptoms: chest pain, ear pain and shortness of breath   Associated symptoms: no chills, no fever, no rhinorrhea and no sore throat    HPI Comments: Angela [name] is a 39 y.o. female with hx of asthma who presents to the Emergency Department complaining of a cough productive of brown/green sputum onset 3 days ago; unrelieved by Vick's Vapo Rub, OTC cough medications, and albuterol inhaler (every 2 hours).  Associated symptoms include SOB, CP with cough only, voice hoarseness, and ear effusion.  She denies hx of asthma related intubation/admission. She denies hx of DVT/PE, CA, use of birth control, or prolonged immobilization.  Patient does not use tobacco.  She denies rhinorrhea, sneezing, sore throat, ear pain, fever, chills. No recent medication changes or environmental changes.   Past Medical History  Diagnosis Date  . Asthma   . Preterm labor   . Headache(784.0)   . Heart murmur   . Hypertension     on meds  . Depression     hx, doing ok now  . Ovarian cyst    Past Surgical History  Procedure Laterality Date  . Tubal ligation  1998   Family History  Problem Relation Age of Onset  . Hyperlipidemia Mother   . Cancer Son   . Other Neg Hx   . Hypertension Other   . Diabetes Other    Social History  Substance Use Topics  . Smoking status: Never Smoker   . Smokeless tobacco: Never Used  . Alcohol Use: No   OB History    Gravida Para Term Preterm AB TAB SAB Ectopic Multiple Living   7 5 3  2 2  0 2 0 0 5     Review of Systems  Constitutional: Negative for fever and chills.  HENT: Positive for ear pain. Negative for rhinorrhea, sneezing and sore throat.   Respiratory: Positive for cough and shortness of breath.   Cardiovascular: Positive for chest pain.      Allergies  Doxycycline and Flagyl  Home Medications   Prior to Admission medications   Medication Sig Start Date End Date Taking? Authorizing Provider  acetaminophen (TYLENOL) 500 MG tablet Take 1,000 mg by mouth every 6 (six) hours as needed for moderate pain or headache (headach).     Historical Provider, MD  albuterol (PROVENTIL HFA;VENTOLIN HFA) 108 (90 BASE) MCG/ACT inhaler Inhale 1-2 puffs into the lungs every 6 (six) hours as needed for wheezing or shortness of breath (wheezing).    Historical Provider, MD  cephALEXin (KEFLEX) 500 MG capsule Take 1 capsule (500 mg total) by mouth 4 (four) times daily. 11/14/14   Gregor Hams, MD  fluconazole (DIFLUCAN) 150 MG tablet Take 1 tablet (150 mg total) by mouth once. 11/14/14   Gregor Hams, MD  fluticasone (FLONASE) 50 MCG/ACT nasal spray Place 2 sprays into both nostrils daily. 02/17/15   Dellis Filbert Hedges, PA-C  HYDROcodone-homatropine (HYCODAN) 5-1.5 MG/5ML syrup Take 5 mLs by mouth every 6 (six)  hours as needed for cough. 03/28/15   Melony Overly, MD  ibuprofen (ADVIL,MOTRIN) 600 MG tablet Take 1 tablet (600 mg total) by mouth every 6 (six) hours as needed. 03/28/15   Melony Overly, MD  sulfamethoxazole-trimethoprim (BACTRIM DS,SEPTRA DS) 800-160 MG per tablet Take 2 tablets by mouth 2 (two) times daily. 11/14/14   Gregor Hams, MD  triamterene-hydrochlorothiazide (MAXZIDE-25) 37.5-25 MG per tablet Take 1 tablet by mouth daily. 02/17/15   Jeffrey Hedges, PA-C   BP 158/103 mmHg  Pulse 90  Temp(Src) 98.7 F (37.1 C) (Oral)  Resp 20  SpO2 99%  LMP 05/15/2015 (Exact Date) Physical Exam  Constitutional: She is oriented to person, place, and time. She appears well-developed  and well-nourished. No distress.  HENT:  Head: Normocephalic and atraumatic.  ENT normal  Eyes: Conjunctivae and EOM are normal.  Neck: Neck supple. No tracheal deviation present.  Cardiovascular: Normal rate.   Pulmonary/Chest: Effort normal and breath sounds normal. No respiratory distress. She has no wheezes.  Musculoskeletal: Normal range of motion.  Neurological: She is alert and oriented to person, place, and time.  Skin: Skin is warm and dry.  Psychiatric: She has a normal mood and affect. Her behavior is normal.  Nursing note and vitals reviewed.   ED Course  Procedures (including critical care time)  DIAGNOSTIC STUDIES: Oxygen Saturation is 99% on RA, normal by my interpretation.    COORDINATION OF CARE:  3:47 PM Informed patient of normal CXR.  Discussed suspicion of viral etiology.  Will provide anti-tussive.  Patient acknowledges and agrees with plan.    Labs Review Labs Reviewed - No data to display  Imaging Review Dg Chest 2 View  05/19/2015  CLINICAL DATA:  Cough, congestion, chest discomfort x 3 days, hx of htn, asthma EXAM: CHEST  2 VIEW COMPARISON:  03/28/2015 FINDINGS: Midline trachea. Borderline cardiomegaly. Mediastinal contours otherwise within normal limits. No pleural effusion or pneumothorax. Mild right hemidiaphragm elevation. Clear lungs. IMPRESSION: No acute cardiopulmonary disease. Electronically Signed   By: Abigail Miyamoto M.D.   On: 05/19/2015 14:29   I have personally reviewed and evaluated these images and lab results as part of my medical decision-making.   EKG Interpretation None      MDM   Final diagnoses:  Cough    BP 158/103 mmHg  Pulse 90  Temp(Src) 98.7 F (37.1 C) (Oral)  Resp 20  SpO2 99%  LMP 05/15/2015 (Exact Date)   I personally performed the services described in this documentation, which was scribed in my presence. The recorded information has been reviewed and is accurate.      Angela Moras, PA-C 05/19/15  [place], DO 05/20/15 [phone]

## 2015-07-06 ENCOUNTER — Emergency Department (HOSPITAL_COMMUNITY)
Admission: EM | Admit: 2015-07-06 | Discharge: 2015-07-07 | Disposition: A | Discharge status: Home / Self Care | Payer: No Typology Code available for payment source | Attending: Emergency Medicine | Admitting: Emergency Medicine

## 2015-07-06 ENCOUNTER — Encounter (HOSPITAL_COMMUNITY): Payer: Self-pay | Admitting: *Deleted

## 2015-07-06 DIAGNOSIS — Z7951 Long term (current) use of inhaled steroids: Secondary | ICD-10-CM | POA: Insufficient documentation

## 2015-07-06 DIAGNOSIS — J45909 Unspecified asthma, uncomplicated: Secondary | ICD-10-CM | POA: Insufficient documentation

## 2015-07-06 DIAGNOSIS — Z8742 Personal history of other diseases of the female genital tract: Secondary | ICD-10-CM | POA: Insufficient documentation

## 2015-07-06 DIAGNOSIS — G43809 Other migraine, not intractable, without status migrainosus: Secondary | ICD-10-CM

## 2015-07-06 DIAGNOSIS — I10 Essential (primary) hypertension: Secondary | ICD-10-CM

## 2015-07-06 DIAGNOSIS — Z8659 Personal history of other mental and behavioral disorders: Secondary | ICD-10-CM | POA: Insufficient documentation

## 2015-07-06 DIAGNOSIS — Z792 Long term (current) use of antibiotics: Secondary | ICD-10-CM | POA: Insufficient documentation

## 2015-07-06 DIAGNOSIS — Z79899 Other long term (current) drug therapy: Secondary | ICD-10-CM | POA: Insufficient documentation

## 2015-07-06 DIAGNOSIS — R011 Cardiac murmur, unspecified: Secondary | ICD-10-CM | POA: Insufficient documentation

## 2015-07-06 MED ORDER — KETOROLAC TROMETHAMINE 30 MG/ML IJ SOLN
30.0000 mg | Freq: Once | INTRAMUSCULAR | Status: AC
  Administered 2015-07-06: 30 mg via INTRAVENOUS
  Filled 2015-07-06: qty 1

## 2015-07-06 MED ORDER — DIPHENHYDRAMINE HCL 50 MG/ML IJ SOLN
25.0000 mg | Freq: Once | INTRAMUSCULAR | Status: DC
  Filled 2015-07-06: qty 1

## 2015-07-06 MED ORDER — SODIUM CHLORIDE 0.9 % IV BOLUS (SEPSIS)
1000.0000 mL | Freq: Once | INTRAVENOUS | Status: AC
  Administered 2015-07-06: 1000 mL via INTRAVENOUS

## 2015-07-06 MED ORDER — PROCHLORPERAZINE EDISYLATE 5 MG/ML IJ SOLN
10.0000 mg | Freq: Once | INTRAMUSCULAR | Status: DC
  Filled 2015-07-06: qty 2

## 2015-07-06 NOTE — ED Notes (Signed)
Pt admits to migraine HA that began yesterday evening, states she took tylenol at home w/o relief - pt tearful in triage. Pt admits to hx of migraines.

## 2015-07-06 NOTE — ED Provider Notes (Signed)
CSN: [id number]     Arrival date & time 07/06/15  2101 History   First MD Initiated Contact with Patient 07/06/15 2225     Chief Complaint  Patient presents with  . Migraine  . Blurred Vision     (Consider location/radiation/quality/duration/timing/severity/associated sxs/prior Treatment) HPI   Pt with hx migraine headaches presents with her typical migraine symptoms.  Pain is in her bilateral temples, worse with moving and bending over.  Associated blurry vision, lightheadedness.  Sensitive to light and sound.  Pain began gradually last night.  Took tylenol today without relief (x1).  Has been spacing out her blood pressure medication because she is running out, took a dose at 5pm, first in several days. Denies fevers, recent illness, head trauma, neck pain or stiffness, "worst" headache of life, sudden onset or "thunderclap" headache.  Denies focal neurologic deficits.    Past Medical History  Diagnosis Date  . Asthma   . Preterm labor   . Headache(784.0)   . Heart murmur   . Hypertension     on meds  . Depression     hx, doing ok now  . Ovarian cyst    Past Surgical History  Procedure Laterality Date  . Tubal ligation  1998   Family History  Problem Relation Age of Onset  . Hyperlipidemia Mother   . Cancer Son   . Other Neg Hx   . Hypertension Other   . Diabetes Other    Social History  Substance Use Topics  . Smoking status: Never Smoker   . Smokeless tobacco: Never Used  . Alcohol Use: No   OB History    Gravida Para Term Preterm AB TAB SAB Ectopic Multiple Living   7 5 3 2 2  0 2 0 0 5     Review of Systems  All other systems reviewed and are negative.     Allergies  Doxycycline and Flagyl  Home Medications   Prior to Admission medications   Medication Sig Start Date End Date Taking? Authorizing Provider  acetaminophen (TYLENOL) 500 MG tablet Take 1,000 mg by mouth every 6 (six) hours as needed for moderate pain or headache (headach).     Historical  Provider, MD  albuterol (PROVENTIL HFA;VENTOLIN HFA) 108 (90 BASE) MCG/ACT inhaler Inhale 1-2 puffs into the lungs every 6 (six) hours as needed for wheezing or shortness of breath (wheezing).    Historical Provider, MD  cephALEXin (KEFLEX) 500 MG capsule Take 1 capsule (500 mg total) by mouth 4 (four) times daily. 11/14/14   Gregor Hams, MD  fluconazole (DIFLUCAN) 150 MG tablet Take 1 tablet (150 mg total) by mouth once. 11/14/14   Gregor Hams, MD  fluticasone (FLONASE) 50 MCG/ACT nasal spray Place 2 sprays into both nostrils daily. 02/17/15   Dellis Filbert Hedges, PA-C  HYDROcodone-homatropine (HYCODAN) 5-1.5 MG/5ML syrup Take 5 mLs by mouth every 6 (six) hours as needed for cough. 03/28/15   Melony Overly, MD  ibuprofen (ADVIL,MOTRIN) 600 MG tablet Take 1 tablet (600 mg total) by mouth every 6 (six) hours as needed. 03/28/15   Melony Overly, MD  predniSONE (DELTASONE) 20 MG tablet 2 tabs po daily x 4 days 05/19/15   Domenic Moras, PA-C  promethazine-dextromethorphan (PROMETHAZINE-DM) 6.25-15 MG/5ML syrup Take 5 mLs by mouth 4 (four) times daily as needed for cough. 05/19/15   Domenic Moras, PA-C  sulfamethoxazole-trimethoprim (BACTRIM DS,SEPTRA DS) 800-160 MG per tablet Take 2 tablets by mouth 2 (two) times daily. 11/14/14  Gregor Hams, MD  triamterene-hydrochlorothiazide (MAXZIDE-25) 37.5-25 MG per tablet Take 1 tablet by mouth daily. 02/17/15   Jeffrey Hedges, PA-C   BP 159/118 mmHg  Pulse 117  Temp(Src) 98 F (36.7 C) (Oral)  Resp 20  Ht 5\' 1"  (1.549 m)  Wt 137.979 kg  BMI 57.51 kg/m2  SpO2 98%  LMP 06/12/2015 Physical Exam  Constitutional: She appears well-developed and well-nourished. No distress.  HENT:  Head: Normocephalic and atraumatic.  Eyes: Conjunctivae are normal.  Neck: Normal range of motion. Neck supple.  Pulmonary/Chest: Effort normal.  Neurological: She is alert. She exhibits normal muscle tone.  CN II-XII intact, EOMs intact, no pronator drift, grip strengths equal bilaterally;  strength 5/5 in all extremities, sensation intact in all extremities; finger to nose, heel to shin, rapid alternating movements normal; gait is normal.     Skin: She is not diaphoretic.  Psychiatric: She has a normal mood and affect. Her behavior is normal.  Nursing note and vitals reviewed.   ED Course  Procedures (including critical care time) Labs Review Labs Reviewed - No data to display  Imaging Review No results found. I have personally reviewed and evaluated these images and lab results as part of my medical decision-making.   EKG Interpretation None       11:46 PM Pt reports great relief from headache, ready for discharge home.    MDM   Final diagnoses:  Other migraine without status migrainosus, not intractable  Essential hypertension   Afebrile, nontoxic patient with typical migraine  headache.  No red flags including head trauma, fevers, meningeal signs, focal neurologic deficits.  Migraine cocktail  given with relief.  Pt has known HTN, has not been taking medication but did today.  Refill given.    D/C home with PCP follow up.  Discussed result, findings, treatment, and follow up  with patient.  Pt given return precautions.  Pt verbalizes understanding and agrees with plan.      Clayton Bibles, PA-C 07/07/15 0006  Leonard Schwartz, MD 07/09/15 [phone]

## 2015-07-06 NOTE — Discharge Instructions (Signed)
Read the information below.  You may return to the Emergency Department at any time for worsening condition or any new symptoms that concern you.  You are having a headache. No specific cause was found today for your headache. It may have been a migraine or other cause of headache. Stress, anxiety, fatigue, and depression are common triggers for headaches. Your headache today does not appear to be life-threatening or require hospitalization, but often the exact cause of headaches is not determined in the emergency department. Therefore, follow-up with your doctor is very important to find out what may have caused your headache, and whether or not you need any further diagnostic testing or treatment. Sometimes headaches can appear benign (not harmful), but then more serious symptoms can develop which should prompt an immediate re-evaluation by your doctor or the emergency department. SEEK MEDICAL ATTENTION IF: You develop possible problems with medications prescribed.  The medications don't resolve your headache, if it recurs , or if you have multiple episodes of vomiting or can't take fluids. You have a change from the usual headache. RETURN IMMEDIATELY IF you develop a sudden, severe headache or confusion, become poorly responsive or faint, develop a fever above 100.80F or problem breathing, have a change in speech, vision, swallowing, or understanding, or develop new weakness, numbness, tingling, incoordination, or have a seizure.   Migraine Headache A migraine headache is an intense, throbbing pain on one or both sides of your head. A migraine can last for 30 minutes to several hours. CAUSES  The exact cause of a migraine headache is not always known. However, a migraine may be caused when nerves in the brain become irritated and release chemicals that cause inflammation. This causes pain. Certain things may also trigger migraines, such as:  Alcohol.  Smoking.  Stress.  Menstruation.  Aged  cheeses.  Foods or drinks that contain nitrates, glutamate, aspartame, or tyramine.  Lack of sleep.  Chocolate.  Caffeine.  Hunger.  Physical exertion.  Fatigue.  Medicines used to treat chest pain (nitroglycerine), birth control pills, estrogen, and some blood pressure medicines. SIGNS AND SYMPTOMS  Pain on one or both sides of your head.  Pulsating or throbbing pain.  Severe pain that prevents daily activities.  Pain that is aggravated by any physical activity.  Nausea, vomiting, or both.  Dizziness.  Pain with exposure to bright lights, loud noises, or activity.  General sensitivity to bright lights, loud noises, or smells. Before you get a migraine, you may get warning signs that a migraine is coming (aura). An aura may include:  Seeing flashing lights.  Seeing bright spots, halos, or zigzag lines.  Having tunnel vision or blurred vision.  Having feelings of numbness or tingling.  Having trouble talking.  Having muscle weakness. DIAGNOSIS  A migraine headache is often diagnosed based on:  Symptoms.  Physical exam.  A CT scan or MRI of your head. These imaging tests cannot diagnose migraines, but they can help rule out other causes of headaches. TREATMENT Medicines may be given for pain and nausea. Medicines can also be given to help prevent recurrent migraines.  HOME CARE INSTRUCTIONS  Only take over-the-counter or prescription medicines for pain or discomfort as directed by your health care provider. The use of long-term narcotics is not recommended.  Lie down in a dark, quiet room when you have a migraine.  Keep a journal to find out what may trigger your migraine headaches. For example, write down:  What you eat and drink.  How  much sleep you get.  Any change to your diet or medicines.  Limit alcohol consumption.  Quit smoking if you smoke.  Get 7-9 hours of sleep, or as recommended by your health care provider.  Limit  stress.  Keep lights dim if bright lights bother you and make your migraines worse. SEEK IMMEDIATE MEDICAL CARE IF:   Your migraine becomes severe.  You have a fever.  You have a stiff neck.  You have vision loss.  You have muscular weakness or loss of muscle control.  You start losing your balance or have trouble walking.  You feel faint or pass out.  You have severe symptoms that are different from your first symptoms. MAKE SURE YOU:   Understand these instructions.  Will watch your condition.  Will get help right away if you are not doing well or get worse.   This information is not intended to replace advice given to you by your health care provider. Make sure you discuss any questions you have with your health care provider.   Document Released: 05/20/2005 Document Revised: 06/10/2014 Document Reviewed: 01/25/2013 Elsevier Interactive Patient Education 2016 [employer].    Emergency Department Resource Guide 1) Find a Doctor and Pay Out of Pocket Although you won't have to find out who is covered by your insurance plan, it is a good idea to ask around and get recommendations. You will then need to call the office and see if the doctor you have chosen will accept you as a new patient and what types of options they offer for patients who are self-pay. Some doctors offer discounts or will set up payment plans for their patients who do not have insurance, but you will need to ask so you aren't surprised when you get to your appointment.  2) Contact Your Local Health Department Not all health departments have doctors that can see patients for sick visits, but many do, so it is worth a call to see if yours does. If you don't know where your local health department is, you can check in your phone book. The CDC also has a tool to help you locate your state's health department, and many state websites also have listings of all of their local health departments.  3) Find a  [facility] If your illness is not likely to be very severe or complicated, you may want to try a walk in clinic. These are popping up all over the country in pharmacies, drugstores, and shopping centers. They're usually staffed by nurse practitioners or physician assistants that have been trained to treat common illnesses and complaints. They're usually fairly quick and inexpensive. However, if you have serious medical issues or chronic medical problems, these are probably not your best option.  No Primary Care Doctor: - Call Health Connect at  [phone] - they can help you locate a primary care doctor that  accepts your insurance, provides certain services, etc. - Physician Referral Service- [phone]  Chronic Pain Problems: Organization         Address  Phone   Notes  [facility]  [phone] Patients need to be referred by their primary care doctor.   Medication Assistance: Organization         Address  Phone   Notes  [facility] Medication [facility] [address] [place], [place] [phone] --Must be a resident of [facility] [place] -- Must have NO insurance coverage whatsoever (no Medicaid/ Medicare, etc.) --  The pt. MUST have a primary care doctor that directs their care regularly and follows them in the community   MedAssist  [phone]   [employer]  9786004139    Agencies that provide inexpensive medical care: Organization         Address  Phone   Notes  [place]  [phone]   [facility]    [phone]   [facility] [place], [place] [address] [phone]   [place] [address] [place] [phone]   Planned Parenthood    [phone]   [facility]    [phone]   [place] and [place] [address], [place] Phone:  [phone], Fax:  [phone] Hours of Operation:  9 am - 6 pm, M-F.  Also accepts Medicaid/Medicare and self-pay.  [facility] [place] [address], [address], [place] Phone: [phone], Fax: [phone]. Hours of Operation:  8:30 am - 5:30 pm, M-F.  Also accepts Medicaid and self-pay.  [facility] [place] [address] [place] Phone: [phone]   [place], [place], [place] [phone], Ext. [phone] Mondays & Thursdays: 7-9 AM.  First 15 patients are seen on a first come, first serve basis.    [place] Providers:  Organization         Address  Phone   Notes  [facility] [address], [place] [phone] Also accepts self-pay patients.  [facility] [id number] [place], [place]  [phone]   [place], [address], [place] [phone]   [facility] [address] [place] [phone]   Lucianne Lei [address], [place]   [phone] Only accepts [place] Access [place] patients after they have their name applied to their card.   Self-Pay (no insurance) in [facility]:  Organization         Address  Phone   Notes  Sickle Cell Patients, [facility] [place] [phone]   [facility] [place] [phone]   [facility] [place]  [place], [place], [place] [phone]   [facility]/Dr. Osei-Bonsu  [address] [place] or [address], [place] [phone] Phone number for both [place] and [place] locations is the same.  Urgent Medical and [facility] [address] [place] [phone]   [facility] [address], [place] or [address] [phone]    [facility] [address], [place]hail [place] [phone], phone; [phone], fax Sees patients 1st and 3rd Saturday of every month.  Must not qualify for public or private insurance (i.e. Medicaid, Medicare, [place] Health Choice, Veterans' Benefits)  Household income should be no more than 200% of the poverty level The clinic cannot treat you if you are pregnant or think you are pregnant  Sexually transmitted diseases are not treated at the clinic.    Dental Care: Organization         Address  Phone  Notes  [facility] [phone]  [place] [phone] Accepts children up to age 50 who are enrolled in [place] or [place]; pregnant women with a Medicaid card; and children who have applied for Medicaid or [place] Health Choice, but were declined, whose parents can pay a reduced fee at time of service.  [facility]  [address], [place] [phone] Accepts children up to age 72 who are enrolled in [place] or [place]; pregnant women with a Medicaid card; and children who have applied for Medicaid or [place] Health Choice, but were declined, whose parents can pay a reduced fee at time of service.  [place] Adult Dental Access PROGRAM  [place] [phone] Patients are seen by appointment only. Walk-ins are not accepted. [place] will see patients 9 years of age and older. Monday - Tuesday (8am-5pm) Most Wednesdays (8:30-5pm) $30 per visit, cash only  [facility] Adult Dental Access PROGRAM  [address], [facility] [phone] Patients are seen by appointment only. Walk-ins are not accepted. [place] will see patients 49 years of age and older. One Wednesday Evening (Monthly: Volunteer Based).  $30 per visit, cash only  [place]  [phone] for adults; Children under age 97, call  Graduate Pediatric Dentistry at [phone]. Children aged 38-14, please call [phone] to request a pediatric application.  Dental services are provided in all areas of dental care including fillings, crowns and bridges, complete and partial dentures, implants, gum treatment, root canals, and extractions. Preventive care is also provided. Treatment is provided to both adults and children. Patients are selected via a lottery and there is often a waiting list.   [facility] [address] [place]  [phone] [email or web address]   [facility] [address] [place], [place] [phone], Ext. [phone] Second and Fourth Thursday of each month, opens at 6:30 AM; Clinic ends at 9 AM.  Patients are seen on a first-come first-served basis, and a limited number are seen during each clinic.   [facility]  [address] Hillard Danker [place], [place] [phone]   Eligibility Requirements You must have lived in [place], [place], or [place] counties for at least the last three months.   You cannot be eligible for state or federal sponsored [employer], including [employer], [place], or [employer].   You generally cannot be eligible for healthcare insurance through your employer.    How to apply: Eligibility screenings are held every Tuesday and Wednesday afternoon from 1:00 pm until 4:00 pm. You do not need an appointment for the interview!  [facility] [address], [place], [place]   [place]  [place] Department  [place]  [phone]    Behavioral Health Resources in the Community: Intensive Outpatient Programs Organization         Address  Phone  Notes  [place]. [address] [place], [place] [phone]   [facility] Outpatient [address] [place], [place]   ADS: Alcohol & Drug Svcs [address] [place], [place]   [place] [address]  [place], [place] or [phone]   Substance Abuse Resources Organization         Address  Phone  Notes  Alcohol  and Drug Services  [phone]   [place]  [phone]   [facility] [place]   Chinita Pester  [phone]   Residential & Outpatient Substance Abuse Program  [phone]   Psychological Services Organization         Address  Phone  Notes  [facility]  [place]  6362654129   [place] [address], [place] or [phone]    Mobile Crisis Teams Organization         Address  Phone  Notes  [facility], Mobile Crisis Care Unit  [phone]   Assertive Psychotherapeutic Services  [address] [place], [place]   [place] [address] [place] [phone]    Self-Help/Support Groups Organization         Address  Phone             Notes  [place]. of [place] - variety of support groups  [place] Call for more information  Narcotics Anonymous (NA), [facility] [address], [employer] [place]  2 meetings at this location   Special educational needs teacher         Address  Phone  Notes  ASAP Residential Treatment [place],    [place]  [phone]   [facility]  [address] [place] [id number], [place] Sueann [place], [place]   [place], [place] [phone] Admissions: 8am-3pm M-F  Incentives Substance [place] [address],    [place], [place] [phone]   [facility] [address] [place], [place], [facility]   [facility] [address]  [place], [place]   [facility] Programs - Intensive Outpatient [address] Kristeen [address], [place], [place]   [facility] ([place].,  [place], [place] [phone] or [phone]   [facility]idential Treatment Services (RTS) [address] [place], [place] Accepts Medicaid  [facility] [place] [address],  [place] [phone] Substance Abuse/Addiction Treatment   [facility] Organization         Address  Phone  Notes  [facility]  [phone]   Domenic Schwab, PhD [address] Arlis [place], [place]   [phone] or [phone]   [place], [place] [phone]   [facility] [address], [place], [place] [phone] Insurance/Medicaid/sponsorship through [facility] and Families [address] [place]                                    [place], [place] [phone] [place] [address], [place] [phone]    Dr. Adele Schilder  [phone]   [facility] Dept. 1) [address] [place] 2) [place] 3)  [place] [address], [place] [phone]  [phone]   [place] [phone] or [phone] (After Hours)

## 2015-07-06 NOTE — ED Notes (Signed)
Patient ambulated to the BR without difficulty

## 2015-07-07 MED ORDER — TRIAMTERENE-HCTZ 37.5-25 MG PO TABS: 1.0000 | ORAL_TABLET | Freq: Every day | ORAL | Status: DC

## 2015-07-07 NOTE — ED Notes (Signed)
Discharge instructions and prescription reviewed - voiced understanding.  

## 2015-10-23 ENCOUNTER — Emergency Department (HOSPITAL_COMMUNITY): Payer: No Typology Code available for payment source

## 2015-10-23 ENCOUNTER — Emergency Department (HOSPITAL_COMMUNITY)
Admission: EM | Admit: 2015-10-23 | Discharge: 2015-10-23 | Disposition: A | Discharge status: Home / Self Care | Payer: No Typology Code available for payment source | Attending: Emergency Medicine | Admitting: Emergency Medicine

## 2015-10-23 ENCOUNTER — Encounter (HOSPITAL_COMMUNITY): Payer: Self-pay | Admitting: Emergency Medicine

## 2015-10-23 DIAGNOSIS — M25561 Pain in right knee: Secondary | ICD-10-CM | POA: Insufficient documentation

## 2015-10-23 DIAGNOSIS — J069 Acute upper respiratory infection, unspecified: Secondary | ICD-10-CM | POA: Insufficient documentation

## 2015-10-23 DIAGNOSIS — Z791 Long term (current) use of non-steroidal anti-inflammatories (NSAID): Secondary | ICD-10-CM | POA: Insufficient documentation

## 2015-10-23 DIAGNOSIS — Z792 Long term (current) use of antibiotics: Secondary | ICD-10-CM | POA: Insufficient documentation

## 2015-10-23 DIAGNOSIS — J45901 Unspecified asthma with (acute) exacerbation: Secondary | ICD-10-CM | POA: Insufficient documentation

## 2015-10-23 DIAGNOSIS — Z79899 Other long term (current) drug therapy: Secondary | ICD-10-CM | POA: Insufficient documentation

## 2015-10-23 DIAGNOSIS — I10 Essential (primary) hypertension: Secondary | ICD-10-CM | POA: Insufficient documentation

## 2015-10-23 DIAGNOSIS — Z7952 Long term (current) use of systemic steroids: Secondary | ICD-10-CM | POA: Insufficient documentation

## 2015-10-23 DIAGNOSIS — F329 Major depressive disorder, single episode, unspecified: Secondary | ICD-10-CM | POA: Insufficient documentation

## 2015-10-23 MED ORDER — IBUPROFEN 600 MG PO TABS: 600.0000 mg | ORAL_TABLET | Freq: Four times a day (QID) | ORAL | Status: DC | PRN

## 2015-10-23 MED ORDER — BENZONATATE 100 MG PO CAPS: 200.0000 mg | ORAL_CAPSULE | Freq: Two times a day (BID) | ORAL | Status: DC | PRN

## 2015-10-23 MED ORDER — ALBUTEROL SULFATE HFA 108 (90 BASE) MCG/ACT IN AERS
2.0000 | INHALATION_SPRAY | Freq: Once | RESPIRATORY_TRACT | Status: AC
  Administered 2015-10-23: 2 via RESPIRATORY_TRACT
  Filled 2015-10-23: qty 6.7

## 2015-10-23 MED ORDER — DEXTROMETHORPHAN-GUAIFENESIN 10-200 MG PO CAPS: 1.0000 | ORAL_CAPSULE | Freq: Four times a day (QID) | ORAL | Status: DC | PRN

## 2015-10-23 NOTE — ED Notes (Signed)
Respiratory @ bedside

## 2015-10-23 NOTE — ED Notes (Addendum)
Patient reports productive cough for 2 days. Patient is also reporting right knee pain "for a while." Patient reports falling on this knee a couples months ago and was not seen for this injury. Denies fever, nausea, vomiting, and diarrhea

## 2015-10-23 NOTE — ED Provider Notes (Signed)
CSN: [id number]     Arrival date & time 10/23/15  1547 History   By signing my name below, I, Rowan Blase, attest that this documentation has been prepared under the direction and in the presence of non-physician practitioner, Harlene Ramus, PA-C. Electronically Signed: Rowan Blase, Scribe. 10/23/2015. 4:54 PM.    Chief Complaint  Patient presents with  . Cough  . Knee Pain   The history is provided by the patient. No language interpreter was used.   HPI Comments:  Angela [name] is a 40 y.o. female with PMHx of asthma who presents to the Emergency Department complaining of worsening persistent cough productive of green sputum for the past 3 days. Pt reports associated chest pain secondary to cough, sinus pressure, wheezing and shortness of breath. Pt ran out of her inhaler 1 month ago. She has been taking Zyrtec regularly; she has also taken an OTCdecongestant with mild relief. Baseline BP is ~140-150. Denies fever, ear pain, sore throat, congestion, rhinorrhea, hemoptysis, abdominal pain, nausea, vomiting or sick contacts.  Pt also reports worsening intermittent non-radiating right knee pain and swelling; pain worsens with movement. She states her right knee gave out while shopping a few days ago and becomes stiff if she sits for too long. Pt notes falling a few months ago and notes it has been bothering her since. She has taken Tylenol without relief. No alleviating factors noted. Denies numbness, tingling, redness or warmth.  Past Medical History  Diagnosis Date  . Asthma   . Preterm labor   . Headache(784.0)   . Heart murmur   . Hypertension     on meds  . Depression     hx, doing ok now  . Ovarian cyst    Past Surgical History  Procedure Laterality Date  . Tubal ligation  1998   Family History  Problem Relation Age of Onset  . Hyperlipidemia Mother   . Cancer Son   . Other Neg Hx   . Hypertension Other   . Diabetes Other    Social History  Substance Use  Topics  . Smoking status: Never Smoker   . Smokeless tobacco: Never Used  . Alcohol Use: No   OB History    Gravida Para Term Preterm AB TAB SAB Ectopic Multiple Living   7 5 3 2 2  0 2 0 0 5     Review of Systems  Constitutional: Negative for fever.  HENT: Positive for sinus pressure. Negative for congestion, ear pain, rhinorrhea and sore throat.   Respiratory: Positive for cough, shortness of breath and wheezing.   Cardiovascular: Positive for chest pain.  Gastrointestinal: Negative for nausea, vomiting and abdominal pain.  Musculoskeletal: Positive for myalgias, joint swelling and arthralgias.  Skin: Negative for color change.  Neurological: Positive for weakness. Negative for numbness.    Allergies  Doxycycline and Flagyl  Home Medications   Prior to Admission medications   Medication Sig Start Date End Date Taking? Authorizing Provider  acetaminophen (TYLENOL) 500 MG tablet Take 1,000 mg by mouth every 6 (six) hours as needed for moderate pain or headache (headach).     Historical Provider, MD  albuterol (PROVENTIL HFA;VENTOLIN HFA) 108 (90 BASE) MCG/ACT inhaler Inhale 1-2 puffs into the lungs every 6 (six) hours as needed for wheezing or shortness of breath (wheezing).    Historical Provider, MD  benzonatate (TESSALON) 100 MG capsule Take 2 capsules (200 mg total) by mouth 2 (two) times daily as needed for cough. 10/23/15   Elmyra Ricks  Myrtice Lauth, PA-C  cephALEXin (KEFLEX) 500 MG capsule Take 1 capsule (500 mg total) by mouth 4 (four) times daily. 11/14/14   Gregor Hams, MD  Dextromethorphan-Guaifenesin (CORICIDIN HBP CONGESTION/COUGH) 10-200 MG CAPS Take 1 capsule by mouth 4 (four) times daily as needed. 10/23/15   Nona Dell, PA-C  fluconazole (DIFLUCAN) 150 MG tablet Take 1 tablet (150 mg total) by mouth once. 11/14/14   Gregor Hams, MD  fluticasone (FLONASE) 50 MCG/ACT nasal spray Place 2 sprays into both nostrils daily. 02/17/15   Dellis Filbert Hedges, PA-C   HYDROcodone-homatropine (HYCODAN) 5-1.5 MG/5ML syrup Take 5 mLs by mouth every 6 (six) hours as needed for cough. 03/28/15   Melony Overly, MD  ibuprofen (ADVIL,MOTRIN) 600 MG tablet Take 1 tablet (600 mg total) by mouth every 6 (six) hours as needed. 10/23/15   Nona Dell, PA-C  predniSONE (DELTASONE) 20 MG tablet 2 tabs po daily x 4 days 05/19/15   Domenic Moras, PA-C  promethazine-dextromethorphan (PROMETHAZINE-DM) 6.25-15 MG/5ML syrup Take 5 mLs by mouth 4 (four) times daily as needed for cough. 05/19/15   Domenic Moras, PA-C  sulfamethoxazole-trimethoprim (BACTRIM DS,SEPTRA DS) 800-160 MG per tablet Take 2 tablets by mouth 2 (two) times daily. 11/14/14   Gregor Hams, MD  triamterene-hydrochlorothiazide (MAXZIDE-25) 37.5-25 MG tablet Take 1 tablet by mouth daily. 07/07/15   Clayton Bibles, PA-C   BP 155/112 mmHg  Pulse 100  Temp(Src) 99 F (37.2 C) (Oral)  Resp 18  Ht 5\' 1"  (1.549 m)  Wt 137.893 kg  BMI 57.47 kg/m2  SpO2 99%  LMP 10/06/2015   Physical Exam  Constitutional: She is oriented to person, place, and time. She appears well-developed and well-nourished.  HENT:  Head: Normocephalic and atraumatic.  Right Ear: Tympanic membrane normal.  Left Ear: Tympanic membrane normal.  Nose: Right sinus exhibits maxillary sinus tenderness and frontal sinus tenderness. Left sinus exhibits maxillary sinus tenderness and frontal sinus tenderness.  Mouth/Throat: Uvula is midline, oropharynx is clear and moist and mucous membranes are normal. No oropharyngeal exudate, posterior oropharyngeal edema, posterior oropharyngeal erythema or tonsillar abscesses.  Eyes: Conjunctivae and EOM are normal. Right eye exhibits no discharge. Left eye exhibits no discharge. No scleral icterus.  Neck: Normal range of motion. Neck supple.  Cardiovascular: Normal rate, regular rhythm, normal heart sounds and intact distal pulses.   Pulmonary/Chest: Effort normal. No respiratory distress. She has wheezes. She has  no rales. She exhibits no tenderness.  Mild end expiratory wheezes noted in basilar lobes  Abdominal: Soft. Bowel sounds are normal. There is no tenderness.  Musculoskeletal: She exhibits no edema.       Right knee: She exhibits decreased range of motion (due to pain). She exhibits no swelling, no effusion, no ecchymosis, no deformity, no laceration, no erythema, no LCL laxity, normal patellar mobility and no MCL laxity. Tenderness found. Lateral joint line and patellar tendon tenderness noted.  TTP over right anterior and lateral knee with decreased ROM due to pain; pt able to stand and bear weight but endorses pain; full ROM of right foot, ankle and hip; 2+ PT pulse; sensation grossly intact; 5/5 strength; no ACL, MCL, LCL or PCL laxity noted however exam limited due to pt's reported pain  Neurological: She is alert and oriented to person, place, and time.  Skin: Skin is warm and dry.  Nursing note and vitals reviewed.   ED Course  Procedures  DIAGNOSTIC STUDIES:  Oxygen Saturation is 99% on RA, normal by my  interpretation.    COORDINATION OF CARE:  4:45 PM Informed pt of imaging results. Recommended Ibuprofen, ice, elevation and rest for a week. Will refer to an orthopedist. Will order chest x-ray. Discussed treatment plan with parents at bedside and parents agreed to plan.  Labs Review Labs Reviewed - No data to display  Imaging Review Dg Chest 2 View  10/23/2015  CLINICAL DATA:  Chest pain, productive cough, wheezing, and shortness breath for 3 days, hypertension EXAM: CHEST  2 VIEW COMPARISON:  05/19/2015 FINDINGS: Borderline enlargement of cardiac silhouette. Mediastinal contours and pulmonary vascularity normal. Lungs clear. No pleural effusion or pneumothorax. Bones unremarkable. IMPRESSION: No acute abnormalities. Electronically Signed   By: Lavonia Dana M.D.   On: 10/23/2015 17:34   Dg Knee Complete 4 Views Right  10/23/2015  CLINICAL DATA:  Fall a few months ago with  persistent right knee pain. EXAM: RIGHT KNEE - COMPLETE 4+ VIEW COMPARISON:  06/11/2011 FINDINGS: No evidence of fracture, dislocation, or joint effusion. No evidence of arthropathy or other focal bone abnormality. Soft tissues are unremarkable. IMPRESSION: Negative. Electronically Signed   By: Marin Olp M.D.   On: 10/23/2015 16:07   I have personally reviewed and evaluated these images and lab results as part of my medical decision-making.   EKG Interpretation None      MDM   Final diagnoses:  Right knee pain  Upper respiratory infection    Pt CXR negative for acute infiltrate. Patients symptoms are consistent with URI, likely viral etiology. Discussed that antibiotics are not indicated for viral infections. Pt will be discharged with symptomatic treatment.  Pt also given albuterol inhaler in the ED to take home for her asthma. Verbalizes understanding and is agreeable with plan. Pt is hemodynamically stable & in NAD prior to dc.   Pt presents with right knee pain and mild swelling after falling on her knee a few months ago. Denies fever, redness or warmth. VSS. Exam revealed TTP over right anterior and lateral knee. Remaining knee exam unremarkable. RLE neurovascularly intact. Pt able to stand and ambulate but endorses pain. Right knee xray negative. Discussed results and plan to d/c pt home with NSAIDs and RICE protocol. Advised pt to follow up with orthopedics in 1-2 weeks if pain has not improved.   Patients blood pressure 155/112 on initial evaluation. Patient reports history of hypertension and reports taking her medications this morning. Patient denies headache, visual changes, lightheadedness, dizziness, shortness of breath, chest pain, abdominal pain, numbness, tingling, weakness. No signs of hypertensive emergency or urgency at this time. Advised patient to take her medications as prescribed. Advised patient to follow up with her PCP in one week to have her blood pressure  rechecked.    I personally performed the services described in this documentation, which was scribed in my presence. The recorded information has been reviewed and is accurate.    Chesley Noon [place], [place] 10/23/15 [place], MD 10/23/15 [phone]

## 2015-10-23 NOTE — Discharge Instructions (Signed)
Take your medication as prescribed as needed for cough and congestion. I recommend eating prior to taking your ibuprofen to prevent gastrointestinal side effects. I also recommend resting, elevating and icing your right knee for 15-20 minutes 3-4 times daily to help with pain and swelling. If your pain continues or does not improve over the next 1-2 weeks, I recommend following up with the orthopedic office listed above for further evaluation. Follow-up with your primary care provider in the next week to have your blood pressure rechecked. Continue taking your home blood pressure medications as prescribed. Please return to the Emergency Department if symptoms worsen or new onset of fever, difficulty breathing, coughing up blood, headache, neck stiffness, chest pain, shortness of breath, redness, swelling, warmth, numbness, tingling, weakness.

## 2016-01-31 ENCOUNTER — Ambulatory Visit: Payer: Self-pay | Admitting: Obstetrics and Gynecology

## 2016-02-16 ENCOUNTER — Encounter: Payer: Self-pay | Admitting: Obstetrics

## 2016-02-16 ENCOUNTER — Ambulatory Visit (INDEPENDENT_AMBULATORY_CARE_PROVIDER_SITE_OTHER): Payer: Medicaid Other | Admitting: Obstetrics

## 2016-02-16 VITALS — BP 172/118 | HR 86 | Temp 98.9°F | Ht 61.0 in | Wt 310.7 lb

## 2016-02-16 DIAGNOSIS — Z1239 Encounter for other screening for malignant neoplasm of breast: Secondary | ICD-10-CM

## 2016-02-16 DIAGNOSIS — Z3049 Encounter for surveillance of other contraceptives: Secondary | ICD-10-CM | POA: Diagnosis not present

## 2016-02-16 DIAGNOSIS — I10 Essential (primary) hypertension: Secondary | ICD-10-CM

## 2016-02-16 DIAGNOSIS — J4520 Mild intermittent asthma, uncomplicated: Secondary | ICD-10-CM

## 2016-02-16 DIAGNOSIS — Z01419 Encounter for gynecological examination (general) (routine) without abnormal findings: Secondary | ICD-10-CM

## 2016-02-16 DIAGNOSIS — B3731 Acute candidiasis of vulva and vagina: Secondary | ICD-10-CM

## 2016-02-16 DIAGNOSIS — B373 Candidiasis of vulva and vagina: Secondary | ICD-10-CM

## 2016-02-16 DIAGNOSIS — D251 Intramural leiomyoma of uterus: Secondary | ICD-10-CM

## 2016-02-16 MED ORDER — ALBUTEROL SULFATE HFA 108 (90 BASE) MCG/ACT IN AERS
1.0000 | INHALATION_SPRAY | Freq: Four times a day (QID) | RESPIRATORY_TRACT | 99 refills | Status: DC | PRN
Start: 2016-02-16 — End: 2018-03-31

## 2016-02-16 MED ORDER — CLOTRIMAZOLE 1 % EX CREA: 1.0000 "application " | TOPICAL_CREAM | Freq: Two times a day (BID) | CUTANEOUS | 2 refills | Status: DC

## 2016-02-16 MED ORDER — TRIAMTERENE-HCTZ 37.5-25 MG PO TABS: 1.0000 | ORAL_TABLET | Freq: Every day | ORAL | 0 refills | Status: DC

## 2016-02-16 MED ORDER — CARVEDILOL 12.5 MG PO TABS: 12.5000 mg | ORAL_TABLET | Freq: Two times a day (BID) | ORAL | 11 refills | Status: DC

## 2016-02-16 NOTE — Progress Notes (Signed)
Subjective:        Angela [name] is a 40 y.o. female here for a routine exam.  Current complaints: Vaginal itching.    Personal health questionnaire:  Is patient Angela [name] Jewish, have a family history of breast and/or ovarian cancer: no Is there a family history of uterine cancer diagnosed at age < 38, gastrointestinal cancer, urinary tract cancer, family member who is a Field seismologist syndrome-associated carrier: no Is the patient overweight and hypertensive, family history of diabetes, personal history of gestational diabetes, preeclampsia or PCOS: no Is patient over 65, have PCOS,  family history of premature CHD under age 16, diabetes, smoke, have hypertension or peripheral artery disease:  no At any time, has a partner hit, kicked or otherwise hurt or frightened you?: no Over the past 2 weeks, have you felt down, depressed or hopeless?: no Over the past 2 weeks, have you felt little interest or pleasure in doing things?:no   Gynecologic History Patient's last menstrual period was 01/27/2016. Contraception: tubal ligation Last Pap: 2015. Results were: normal Last mammogram: none. Results were: none  Obstetric History OB History  Gravida Para Term Preterm AB Living  7 5 3 2 2 5   SAB TAB Ectopic Multiple Live Births  2 0 0 0 5    # Outcome Date GA Lbr Len/2nd Weight Sex Delivery Anes PTL Lv  7 SAB           6 SAB           5 Preterm      Vag-Spont  Y LIV  4 Preterm      Vag-Spont  Y LIV  3 Term      Vag-Spont  N LIV  2 Term      Vag-Spont  N LIV  1 Term      Vag-Spont  N LIV      Past Medical History:  Diagnosis Date  . Asthma   . Depression    hx, doing ok now  . Headache(784.0)   . Heart murmur   . Hypertension    on meds  . Ovarian cyst   . Preterm labor     Past Surgical History:  Procedure Laterality Date  . TUBAL LIGATION  1998     Current Outpatient Prescriptions:  .  acetaminophen (TYLENOL) 500 MG tablet, Take 1,000 mg by mouth every 6 (six) hours  as needed for moderate pain or headache (headach). , Disp: , Rfl:  .  albuterol (PROVENTIL HFA;VENTOLIN HFA) 108 (90 BASE) MCG/ACT inhaler, Inhale 1-2 puffs into the lungs every 6 (six) hours as needed for wheezing or shortness of breath (wheezing)., Disp: , Rfl:  .  Dextromethorphan-Guaifenesin (CORICIDIN HBP CONGESTION/COUGH) 10-200 MG CAPS, Take 1 capsule by mouth 4 (four) times daily as needed., Disp: 30 each, Rfl: 0 .  fluticasone (FLONASE) 50 MCG/ACT nasal spray, Place 2 sprays into both nostrils daily., Disp: 9.9 g, Rfl: 2 .  ibuprofen (ADVIL,MOTRIN) 600 MG tablet, Take 1 tablet (600 mg total) by mouth every 6 (six) hours as needed., Disp: 30 tablet, Rfl: 0 .  triamterene-hydrochlorothiazide (MAXZIDE-25) 37.5-25 MG tablet, Take 1 tablet by mouth daily., Disp: 30 tablet, Rfl: 0 .  benzonatate (TESSALON) 100 MG capsule, Take 2 capsules (200 mg total) by mouth 2 (two) times daily as needed for cough. (Patient not taking: Reported on 02/16/2016), Disp: 20 capsule, Rfl: 0 .  cephALEXin (KEFLEX) 500 MG capsule, Take 1 capsule (500 mg total) by mouth 4 (four) times daily. (  Patient not taking: Reported on 02/16/2016), Disp: 28 capsule, Rfl: 0 .  fluconazole (DIFLUCAN) 150 MG tablet, Take 1 tablet (150 mg total) by mouth once. (Patient not taking: Reported on 02/16/2016), Disp: 1 tablet, Rfl: 1 .  HYDROcodone-homatropine (HYCODAN) 5-1.5 MG/5ML syrup, Take 5 mLs by mouth every 6 (six) hours as needed for cough. (Patient not taking: Reported on 02/16/2016), Disp: 120 mL, Rfl: 0 .  predniSONE (DELTASONE) 20 MG tablet, 2 tabs po daily x 4 days (Patient not taking: Reported on 02/16/2016), Disp: 8 tablet, Rfl: 0 .  promethazine-dextromethorphan (PROMETHAZINE-DM) 6.25-15 MG/5ML syrup, Take 5 mLs by mouth 4 (four) times daily as needed for cough. (Patient not taking: Reported on 02/16/2016), Disp: 118 mL, Rfl: 0 .  sulfamethoxazole-trimethoprim (BACTRIM DS,SEPTRA DS) 800-160 MG per tablet, Take 2 tablets by mouth 2  (two) times daily. (Patient not taking: Reported on 02/16/2016), Disp: 28 tablet, Rfl: 0 Allergies  Allergen Reactions  . Doxycycline Nausea And Vomiting  . Flagyl [Metronidazole Hcl] Hives and Nausea And Vomiting    Social History  Substance Use Topics  . Smoking status: Never Smoker  . Smokeless tobacco: Never Used  . Alcohol use No    Family History  Problem Relation Age of Onset  . Hyperlipidemia Mother   . Cancer Son   . Hypertension Other   . Diabetes Other   . Other Neg Hx       Review of Systems  Constitutional: negative for fatigue and weight loss Respiratory: negative for cough and wheezing Cardiovascular: negative for chest pain, fatigue and palpitations Gastrointestinal: negative for abdominal pain and change in bowel habits Musculoskeletal:negative for myalgias Neurological: negative for gait problems and tremors Behavioral/Psych: negative for abusive relationship, depression Endocrine: negative for temperature intolerance   Genitourinary:negative for abnormal menstrual periods, genital lesions, hot flashes, sexual problems and vaginal discharge.  Positive for vulva itching. Integument/breast: negative for breast lump, breast tenderness, nipple discharge and skin lesion(s)    Objective:       BP (!) 172/118   Pulse 86   Temp 98.9 F (37.2 C)   Ht 5\' 1"  (1.549 m)   Wt (!) 310 lb 11.2 oz (140.9 kg)   LMP 01/27/2016   BMI 58.71 kg/m  General:   alert  Skin:   no rash or abnormalities  Lungs:   clear to auscultation bilaterally  Heart:   regular rate and rhythm, S1, S2 normal, no murmur, click, rub or gallop  Breasts:   normal without suspicious masses, skin or nipple changes or axillary nodes  Abdomen:  normal findings: no organomegaly, soft, non-tender and no hernia  Pelvis:  External genitalia: excoriations of vulva Urinary system: urethral meatus normal and bladder without fullness, nontender Vaginal: normal without tenderness, induration or  masses Cervix: normal appearance Adnexa: normal bimanual exam Uterus: anteverted and non-tender, normal size   Lab Review Urine pregnancy test Labs reviewed yes Radiologic studies reviewed yes  50% of 20 min visit spent on counseling and coordination of care.   Assessment:    Healthy female exam.    Candida Vulvitis  Fibroids  HTN  Obesity  Asthma   Plan:    Clotrimazole Rx  Will follow fibroids, they are stable clinically.  Coreg and Maxide Rx.  Referred to IM.  Encouraged weight loss program and exercise.  Ventolin Inhaler Rx  Education reviewed: calcium supplements, low fat, low cholesterol diet, safe sex/STD prevention, self breast exams and weight bearing exercise. Contraception: tubal ligation. Mammogram ordered. Follow up in:  1 year.   No orders of the defined types were placed in this encounter.  No orders of the defined types were placed in this encounter.

## 2016-02-21 LAB — PAP IG AND HPV HIGH-RISK
HPV, high-risk: NEGATIVE
PAP Smear Comment: 0

## 2016-02-22 ENCOUNTER — Telehealth: Payer: Self-pay | Admitting: *Deleted

## 2016-02-22 ENCOUNTER — Other Ambulatory Visit: Payer: Self-pay | Admitting: Obstetrics

## 2016-02-22 DIAGNOSIS — N760 Acute vaginitis: Principal | ICD-10-CM

## 2016-02-22 DIAGNOSIS — B9689 Other specified bacterial agents as the cause of diseases classified elsewhere: Secondary | ICD-10-CM

## 2016-02-22 LAB — NUSWAB VG+, CANDIDA 6SP
ATOPOBIUM VAGINAE: HIGH {score} — AB
BVAB 2: HIGH {score} — AB
CANDIDA KRUSEI, NAA: NEGATIVE
CANDIDA PARAPSILOSIS, NAA: NEGATIVE
CANDIDA TROPICALIS, NAA: NEGATIVE
CHLAMYDIA TRACHOMATIS, NAA: NEGATIVE
Candida albicans, NAA: NEGATIVE
Candida glabrata, NAA: NEGATIVE
Candida lusitaniae, NAA: NEGATIVE
Neisseria gonorrhoeae, NAA: NEGATIVE
TRICH VAG BY NAA: NEGATIVE

## 2016-02-22 MED ORDER — CLINDAMYCIN HCL 300 MG PO CAPS: 300.0000 mg | ORAL_CAPSULE | Freq: Three times a day (TID) | ORAL | 0 refills | Status: DC

## 2016-02-22 NOTE — Telephone Encounter (Signed)
Patient made aware of lab result and medication sent to pharmacy.Patient is aware that there is a Good Rx card that may help with the price of her medications if she want one from the pharmacy.

## 2016-04-02 ENCOUNTER — Ambulatory Visit: Payer: No Typology Code available for payment source

## 2016-05-13 ENCOUNTER — Ambulatory Visit: Payer: No Typology Code available for payment source

## 2016-06-11 ENCOUNTER — Ambulatory Visit: Payer: No Typology Code available for payment source

## 2016-08-06 ENCOUNTER — Ambulatory Visit (INDEPENDENT_AMBULATORY_CARE_PROVIDER_SITE_OTHER): Payer: Self-pay | Admitting: Family Medicine

## 2016-08-06 ENCOUNTER — Encounter: Payer: Self-pay | Admitting: Family Medicine

## 2016-08-06 ENCOUNTER — Other Ambulatory Visit: Payer: Self-pay

## 2016-08-06 VITALS — BP 158/102 | HR 95 | Temp 98.2°F | Resp 16 | Ht 61.0 in | Wt 310.6 lb

## 2016-08-06 DIAGNOSIS — I10 Essential (primary) hypertension: Secondary | ICD-10-CM

## 2016-08-06 DIAGNOSIS — R600 Localized edema: Secondary | ICD-10-CM

## 2016-08-06 DIAGNOSIS — M25561 Pain in right knee: Secondary | ICD-10-CM

## 2016-08-06 DIAGNOSIS — G8929 Other chronic pain: Secondary | ICD-10-CM

## 2016-08-06 DIAGNOSIS — Z23 Encounter for immunization: Secondary | ICD-10-CM

## 2016-08-06 LAB — COMPLETE METABOLIC PANEL WITH GFR
ALT: 8 U/L (ref 6–29)
AST: 13 U/L (ref 10–30)
Albumin: 3.6 g/dL (ref 3.6–5.1)
Alkaline Phosphatase: 71 U/L (ref 33–115)
BUN: 16 mg/dL (ref 7–25)
CALCIUM: 9.4 mg/dL (ref 8.6–10.2)
CHLORIDE: 108 mmol/L (ref 98–110)
CO2: 22 mmol/L (ref 20–31)
CREATININE: 1.12 mg/dL — AB (ref 0.50–1.10)
GFR, Est African American: 70 mL/min (ref >=60)
GFR, Est Non African American: 61 mL/min (ref >=60)
Glucose, Bld: 94 mg/dL (ref 65–99)
Potassium: 4.2 mmol/L (ref 3.5–5.3)
Sodium: 140 mmol/L (ref 135–146)
Total Bilirubin: 0.2 mg/dL (ref 0.2–1.2)
Total Protein: 7 g/dL (ref 6.1–8.1)

## 2016-08-06 LAB — TSH: TSH: 5.11 mIU/L — ABNORMAL HIGH

## 2016-08-06 LAB — POCT URINALYSIS DIP (DEVICE)
Bilirubin Urine: NEGATIVE
GLUCOSE, UA: NEGATIVE mg/dL
Hgb urine dipstick: NEGATIVE
KETONES UR: NEGATIVE mg/dL
Leukocytes, UA: NEGATIVE
NITRITE: NEGATIVE
PH: 5.5 (ref 5.0–8.0)
PROTEIN: NEGATIVE mg/dL
Specific Gravity, Urine: 1.025 (ref 1.005–1.030)
UROBILINOGEN UA: 0.2 mg/dL (ref 0.0–1.0)

## 2016-08-06 LAB — CBC WITH DIFFERENTIAL/PLATELET
BASOS ABS: 0 {cells}/uL (ref 0–200)
Basophils Relative: 0 %
EOS ABS: 276 {cells}/uL (ref 15–500)
Eosinophils Relative: 4 %
HEMATOCRIT: 41.9 % (ref 35.0–45.0)
HEMOGLOBIN: 13.6 g/dL (ref 11.7–15.5)
Lymphocytes Relative: 29 %
Lymphs Abs: 2001 cells/uL (ref 850–3900)
MCH: 27.7 pg (ref 27.0–33.0)
MCHC: 32.5 g/dL (ref 32.0–36.0)
MCV: 85.3 fL (ref 80.0–100.0)
MONO ABS: 345 {cells}/uL (ref 200–950)
MPV: 11.9 fL (ref 7.5–12.5)
Monocytes Relative: 5 %
NEUTROS PCT: 62 %
Neutro Abs: 4278 cells/uL (ref 1500–7800)
Platelets: 307 10*3/uL (ref 140–400)
RBC: 4.91 MIL/uL (ref 3.80–5.10)
RDW: 13.6 % (ref 11.0–15.0)
WBC: 6.9 10*3/uL (ref 3.8–10.8)

## 2016-08-06 LAB — POCT GLYCOSYLATED HEMOGLOBIN (HGB A1C): HEMOGLOBIN A1C: 5.5

## 2016-08-06 MED ORDER — TRIAMTERENE-HCTZ 37.5-25 MG PO TABS: 1.0000 | ORAL_TABLET | Freq: Every day | ORAL | 5 refills | Status: DC

## 2016-08-06 MED ORDER — MELOXICAM 7.5 MG PO TABS: 7.5000 mg | ORAL_TABLET | Freq: Every day | ORAL | 0 refills | Status: DC

## 2016-08-06 MED ORDER — CARVEDILOL 12.5 MG PO TABS: 12.5000 mg | ORAL_TABLET | Freq: Two times a day (BID) | ORAL | 5 refills | Status: DC

## 2016-08-06 MED ORDER — KETOROLAC TROMETHAMINE 60 MG/2ML IM SOLN
60.0000 mg | Freq: Once | INTRAMUSCULAR | Status: AC
  Administered 2016-08-06: 60 mg via INTRAMUSCULAR

## 2016-08-06 MED ORDER — CLONIDINE HCL 0.2 MG PO TABS
0.1000 mg | ORAL_TABLET | Freq: Once | ORAL | Status: AC
  Administered 2016-08-06: 0.1 mg via ORAL

## 2016-08-06 MED FILL — CARVEDILOL 12.5 MG TABLET: 12.5 | 30 days supply | Qty: 60 | Fill #0

## 2016-08-06 MED FILL — ?TRIAMTERENE/HCTZ 37.5/25TB: 37.5-25 | 30 days supply | Qty: 30 | Fill #0

## 2016-08-06 MED FILL — MELOXICAM 7.5 MG TABLET: 7.5 | 30 days supply | Qty: 30 | Fill #0

## 2016-08-06 NOTE — Progress Notes (Signed)
Subjective:    Patient ID: Angela [name], female    DOB: 09/05/1975, 40 y.o.   MRN: [id number]  Hypertension  This is a chronic (Ms. Intisar Landin, a 41 year old female with a history of hypetension. She has not had a primary provider. She has been using the emergency department for all primary needs. ) problem. The problem is uncontrolled. Associated symptoms include headaches, palpitations and peripheral edema. Pertinent negatives include no anxiety, blurred vision, malaise/fatigue, neck pain, orthopnea, PND, shortness of breath or sweats. Risk factors for coronary artery disease include obesity and sedentary lifestyle. Past treatments include diuretics and beta blockers (patient has been out of medications for greater than 1 year. ).   Past Medical History:  Diagnosis Date  . Asthma   . Depression    hx, doing ok now  . Headache(784.0)   . Heart murmur   . Hypertension    on meds  . Ovarian cyst   . Preterm labor    Social History   Social History  . Marital status: Single    Spouse name: N/A  . Number of children: N/A  . Years of education: N/A   Occupational History  . Not on file.   Social History Main Topics  . Smoking status: Never Smoker  . Smokeless tobacco: Never Used  . Alcohol use No  . Drug use: No  . Sexual activity: Yes    Birth control/ protection: Condom, Surgical   Other Topics Concern  . Not on file   Social History Narrative  . No narrative on file   Immunization History  Administered Date(s) Administered  . Tdap 08/06/2016   Review of Systems  Constitutional: Negative for fatigue and malaise/fatigue.  HENT: Negative.   Eyes: Negative.  Negative for blurred vision.  Respiratory: Negative.  Negative for shortness of breath.   Cardiovascular: Positive for palpitations and leg swelling. Negative for orthopnea and PND.  Gastrointestinal: Negative.   Endocrine: Negative.  Negative for polydipsia and polyphagia.  Musculoskeletal: Negative.   Negative for neck pain.  Skin: Negative.   Allergic/Immunologic: Negative.   Neurological: Positive for dizziness, weakness and headaches.  Hematological: Negative.   Psychiatric/Behavioral: Negative.        Objective:   Physical Exam  Constitutional: She appears well-developed and well-nourished.  HENT:  Head: Normocephalic and atraumatic.  Right Ear: External ear normal.  Left Ear: External ear normal.  Nose: Nose normal.  Mouth/Throat: Oropharynx is clear and moist.  Eyes: Conjunctivae and EOM are normal. Pupils are equal, round, and reactive to light.  Neck: Normal range of motion. Neck supple.  Cardiovascular: Normal rate, normal heart sounds, intact distal pulses and normal pulses.   No murmur heard. Bilateral lower extremity edema 2+  Pulmonary/Chest: Effort normal and breath sounds normal.  Abdominal: Soft. Bowel sounds are normal.  Musculoskeletal: Normal range of motion.  Neurological: She has normal strength.  Skin: Skin is warm and dry.  Psychiatric: She has a normal mood and affect. Her behavior is normal. Judgment and thought content normal.      BP (!) 180/110 (BP Location: Right Arm, Patient Position: Sitting, Cuff Size: Large)   Pulse 95   Temp 98.2 F (36.8 C) (Oral)   Resp 16   Ht 5\' 1"  (1.549 m)   Wt (!) 310 lb 9.6 oz (140.9 kg)   LMP 07/20/2016   SpO2 100%   BMI 58.69 kg/m  Assessment & Plan:  1. Accelerated hypertension Blood pressure was markedly elevated  on arrival, decreased following Clonidine 0.1 mg.  BP (!) 158/102 (BP Location: Right Arm, Patient Position: Sitting, Cuff Size: Large)   Pulse 95   Temp 98.2 F (36.8 C) (Oral)   Resp 16   Ht 5\' 1"  (1.549 m)   Wt (!) 310 lb 9.6 oz (140.9 kg)   LMP 07/20/2016   SpO2 100%   BMI 58.69 kg/m  - cloNIDine (CATAPRES) tablet 0.1 mg; Take 0.5 tablets (0.1 mg total) by mouth once. - EKG 12-Lead - COMPLETE METABOLIC PANEL WITH GFR - CBC with Differential - triamterene-hydrochlorothiazide  (MAXZIDE-25) 37.5-25 MG tablet; Take 1 tablet by mouth daily.  Dispense: 30 tablet; Refill: 5 - carvedilol (COREG) 12.5 MG tablet; Take 1 tablet (12.5 mg total) by mouth 2 (two) times daily with a meal.  Dispense: 60 tablet; Refill: 5  2. Bilateral lower extremity edema  - Brain natriuretic peptide  3. Morbid obesity ([place]) The patient is asked to make an attempt to improve diet and exercise patterns to aid in medical management of this problem. - TSH - HgB A1c  4. HTN (hypertension), benign - triamterene-hydrochlorothiazide (MAXZIDE-25) 37.5-25 MG tablet; Take 1 tablet by mouth daily.  Dispense: 30 tablet; Refill: 5 - carvedilol (COREG) 12.5 MG tablet; Take 1 tablet (12.5 mg total) by mouth 2 (two) times daily with a meal.  Dispense: 60 tablet; Refill: 5  5. Chronic pain of right knee - ketorolac (TORADOL) injection 60 mg; Inject 2 mLs (60 mg total) into the muscle once. - meloxicam (MOBIC) 7.5 MG tablet; Take 1 tablet (7.5 mg total) by mouth daily.  Dispense: 30 tablet; Refill: 0  6. Need for Tdap vaccination  - Tdap vaccine greater than or equal to 7yo IM    RTC: 1 week for BP check and 1 month for hypertension   Colbert Curenton M, FNP

## 2016-08-07 LAB — BRAIN NATRIURETIC PEPTIDE: BRAIN NATRIURETIC PEPTIDE: 17 pg/mL (ref <100)

## 2016-09-06 ENCOUNTER — Ambulatory Visit: Payer: Self-pay | Admitting: Family Medicine

## 2016-10-16 ENCOUNTER — Ambulatory Visit: Payer: Self-pay

## 2016-10-19 ENCOUNTER — Emergency Department (HOSPITAL_COMMUNITY)
Admission: EM | Admit: 2016-10-19 | Discharge: 2016-10-19 | Disposition: A | Discharge status: Home / Self Care | Payer: Self-pay | Attending: Emergency Medicine | Admitting: Emergency Medicine

## 2016-10-19 ENCOUNTER — Encounter (HOSPITAL_COMMUNITY): Payer: Self-pay | Admitting: Emergency Medicine

## 2016-10-19 DIAGNOSIS — I10 Essential (primary) hypertension: Secondary | ICD-10-CM | POA: Insufficient documentation

## 2016-10-19 DIAGNOSIS — Z79899 Other long term (current) drug therapy: Secondary | ICD-10-CM | POA: Insufficient documentation

## 2016-10-19 DIAGNOSIS — R600 Localized edema: Secondary | ICD-10-CM | POA: Insufficient documentation

## 2016-10-19 DIAGNOSIS — J45909 Unspecified asthma, uncomplicated: Secondary | ICD-10-CM | POA: Insufficient documentation

## 2016-10-19 LAB — COMPREHENSIVE METABOLIC PANEL
ALBUMIN: 3.4 g/dL — AB (ref 3.5–5.0)
ALK PHOS: 76 U/L (ref 38–126)
ALT: 9 U/L — AB (ref 14–54)
AST: 13 U/L — ABNORMAL LOW (ref 15–41)
Anion gap: 5 (ref 5–15)
BUN: 11 mg/dL (ref 6–20)
CALCIUM: 9.4 mg/dL (ref 8.9–10.3)
CHLORIDE: 107 mmol/L (ref 101–111)
CO2: 25 mmol/L (ref 22–32)
CREATININE: 0.95 mg/dL (ref 0.44–1.00)
GFR calc Af Amer: >60 mL/min (ref >60)
GFR calc non Af Amer: >60 mL/min (ref >60)
GLUCOSE: 90 mg/dL (ref 65–99)
Potassium: 3.8 mmol/L (ref 3.5–5.1)
SODIUM: 137 mmol/L (ref 135–145)
Total Bilirubin: 0.4 mg/dL (ref 0.3–1.2)
Total Protein: 7.1 g/dL (ref 6.5–8.1)

## 2016-10-19 LAB — CBC WITH DIFFERENTIAL/PLATELET
Basophils Absolute: 0 10*3/uL (ref 0.0–0.1)
Basophils Relative: 0 %
EOS ABS: 0.3 10*3/uL (ref 0.0–0.7)
EOS PCT: 5 %
HCT: 40.7 % (ref 36.0–46.0)
HEMOGLOBIN: 13.2 g/dL (ref 12.0–15.0)
LYMPHS ABS: 2.4 10*3/uL (ref 0.7–4.0)
Lymphocytes Relative: 38 %
MCH: 27.6 pg (ref 26.0–34.0)
MCHC: 32.4 g/dL (ref 30.0–36.0)
MCV: 85.1 fL (ref 78.0–100.0)
Monocytes Absolute: 0.4 10*3/uL (ref 0.1–1.0)
Monocytes Relative: 6 %
NEUTROS PCT: 51 %
Neutro Abs: 3.2 10*3/uL (ref 1.7–7.7)
PLATELETS: 355 10*3/uL (ref 150–400)
RBC: 4.78 MIL/uL (ref 3.87–5.11)
RDW: 13 % (ref 11.5–15.5)
WBC: 6.4 10*3/uL (ref 4.0–10.5)

## 2016-10-19 MED ORDER — FUROSEMIDE 20 MG PO TABS: 20.0000 mg | ORAL_TABLET | Freq: Every day | ORAL | 0 refills | Status: DC

## 2016-10-19 NOTE — ED Notes (Signed)
Pt understood dc material. NAD Noted. Script given at [employer]. Pt ambulatory at dc

## 2016-10-19 NOTE — Discharge Instructions (Signed)
Follow-up with your primary doctor.  Return here as needed.  Elevate your legs above the level of your heart, especially after standing for long periods of time.  Also, try to minimize your salt intake

## 2016-10-19 NOTE — ED Triage Notes (Signed)
Leg and feet swelling x several weeks  And pain  Got worse  A couple of days hurts to walk, denies injury has hx HTN

## 2016-10-21 NOTE — ED Provider Notes (Signed)
[facility] DEPT Provider Note   CSN: [id number] Arrival date & time: 10/19/16  1430     History   Chief Complaint Chief Complaint  Patient presents with  . Leg Pain    HPI Angela [name] is a 41 y.o. female.  HPI Patient presents to the emergency department with bilateral leg pain with swelling.  Patient states that this is a chronic problem with his legs got worse over the last couple days.  She states it does hurt to walk at times.  The patient, states she is also having pain in the right knee.  Patient states that she does not have any precipitating injury.  Patient states that nothing seems make the condition better or worseThe patient denies chest pain, shortness of breath, headache,blurred vision, neck pain, fever, cough, weakness, numbness, dizziness, anorexia,  abdominal pain, nausea, vomiting, diarrhea, rash, back pain, dysuria, hematemesis, bloody stool, near syncope, or syncope. Past Medical History:  Diagnosis Date  . Asthma   . Depression    hx, doing ok now  . Headache(784.0)   . Heart murmur   . Hypertension    on meds  . Ovarian cyst   . Preterm labor     Patient Active Problem List   Diagnosis Date Noted  . Pain, pelvic, female 02/05/2012  . Fibroids 02/05/2012  . Abnormal uterine bleeding 02/05/2012    Past Surgical History:  Procedure Laterality Date  . TUBAL LIGATION  1998    OB History    Gravida Para Term Preterm AB Living   7 5 3 2 2 5    SAB TAB Ectopic Multiple Live Births   2 0 0 0 5       Home Medications    Prior to Admission medications   Medication Sig Start Date End Date Taking? Authorizing Provider  acetaminophen (TYLENOL) 500 MG tablet Take 1,000 mg by mouth every 6 (six) hours as needed for moderate pain or headache (headach).     [provider]  albuterol (PROVENTIL HFA;VENTOLIN HFA) 108 (90 Base) MCG/ACT inhaler Inhale 1-2 puffs into the lungs every 6 (six) hours as needed for wheezing or shortness of breath  (wheezing). 02/16/16   Shelly Bombard, MD  carvedilol (COREG) 12.5 MG tablet Take 1 tablet (12.5 mg total) by mouth 2 (two) times daily with a meal. 08/06/16   Dorena Dew, FNP  furosemide (LASIX) 20 MG tablet Take 1 tablet (20 mg total) by mouth daily. 10/19/16   Coco Sharpnack, Harrell Gave, PA-C  meloxicam (MOBIC) 7.5 MG tablet Take 1 tablet (7.5 mg total) by mouth daily. 08/06/16   Dorena Dew, FNP  triamterene-hydrochlorothiazide (MAXZIDE-25) 37.5-25 MG tablet Take 1 tablet by mouth daily. 08/06/16   Dorena Dew, FNP    Family History Family History  Problem Relation Age of Onset  . Hyperlipidemia Mother   . Cancer Son   . Hypertension Other   . Diabetes Other   . Other Neg Hx     Social History Social History  Substance Use Topics  . Smoking status: Never Smoker  . Smokeless tobacco: Never Used  . Alcohol use No     Allergies   Doxycycline and Flagyl [metronidazole hcl]   Review of Systems Review of Systems All other systems negative except as documented in the HPI. All pertinent positives and negatives as reviewed in the HPI.  Physical Exam Updated Vital Signs BP (!) 147/87 (BP Location: Right Arm)   Pulse 89   Temp 98.9 F (37.2  C) (Oral)   Resp 19   SpO2 100%   Physical Exam  Constitutional: She is oriented to person, place, and time. She appears well-developed and well-nourished. No distress.  HENT:  Head: Normocephalic and atraumatic.  Mouth/Throat: Oropharynx is clear and moist.  Eyes: Pupils are equal, round, and reactive to light.  Neck: Normal range of motion. Neck supple.  Cardiovascular: Normal rate, regular rhythm and normal heart sounds.  Exam reveals no gallop and no friction rub.   No murmur heard. Pulmonary/Chest: Effort normal and breath sounds normal. No respiratory distress. She has no wheezes.  Abdominal: Soft. Bowel sounds are normal. She exhibits no distension. There is no tenderness.  Musculoskeletal:       Legs: Neurological:  She is alert and oriented to person, place, and time. She exhibits normal muscle tone. Coordination normal.  Skin: Skin is warm and dry. Capillary refill takes less than 2 seconds. No rash noted. No erythema.  Psychiatric: She has a normal mood and affect. Her behavior is normal.  Nursing note and vitals reviewed.    ED Treatments / Results  Labs (all labs ordered are listed, but only abnormal results are displayed) Labs Reviewed  COMPREHENSIVE METABOLIC PANEL - Abnormal; Notable for the following:       Result Value   Albumin 3.4 (*)    AST 13 (*)    ALT 9 (*)    All other components within normal limits  CBC WITH DIFFERENTIAL/PLATELET    EKG  EKG Interpretation None       Radiology No results found.  Procedures Procedures (including critical care time)  Medications Ordered in ED Medications - No data to display   Initial Impression / Assessment and Plan / ED Course  I have reviewed the triage vital signs and the nursing notes.  Pertinent labs & imaging results that were available during my care of the patient were reviewed by me and considered in my medical decision making (see chart for details).     At this point, I feel that the patient is a difficult assessment due to the fact that she has a body habitus that is not conducive for easily discerning swelling in her lower extremities.  The patient does not have any calf pain.  There is no increased warmth or redness of the legs.  Patient is advised follow-up with her primary care doctor.  Patient agrees the plan and all questions were answered.  I did give her a short course of Lasix to attempt to see if this improved the swelling that she feels like she is having  Final Clinical Impressions(s) / ED Diagnoses   Final diagnoses:  Bilateral leg edema    New Prescriptions Discharge Medication List as of 10/19/2016  7:49 PM    START taking these medications   Details  furosemide (LASIX) 20 MG tablet Take 1  tablet (20 mg total) by mouth daily., Starting Sat 10/19/2016, Print         Ketrina Boateng, [place], PA-C 10/21/16 0038    Julianne Rice, MD 10/23/16 [phone]

## 2016-11-07 MED FILL — ?TRIAMTERENE/HCTZ 37.5/25TB: 37.5-25 | 30 days supply | Qty: 30 | Fill #1

## 2016-11-07 MED FILL — CARVEDILOL 12.5 MG TABLET: 12.5 | 30 days supply | Qty: 60 | Fill #1

## 2016-12-17 ENCOUNTER — Encounter (INDEPENDENT_AMBULATORY_CARE_PROVIDER_SITE_OTHER): Payer: Self-pay | Admitting: Physician Assistant

## 2016-12-17 ENCOUNTER — Ambulatory Visit (INDEPENDENT_AMBULATORY_CARE_PROVIDER_SITE_OTHER): Payer: Self-pay | Admitting: Physician Assistant

## 2016-12-17 VITALS — BP 131/84 | HR 109 | Temp 98.2°F | Ht 60.5 in | Wt 322.6 lb

## 2016-12-17 DIAGNOSIS — R7989 Other specified abnormal findings of blood chemistry: Secondary | ICD-10-CM

## 2016-12-17 DIAGNOSIS — M25561 Pain in right knee: Secondary | ICD-10-CM

## 2016-12-17 DIAGNOSIS — I10 Essential (primary) hypertension: Secondary | ICD-10-CM | POA: Insufficient documentation

## 2016-12-17 DIAGNOSIS — R946 Abnormal results of thyroid function studies: Secondary | ICD-10-CM

## 2016-12-17 DIAGNOSIS — G8929 Other chronic pain: Secondary | ICD-10-CM

## 2016-12-17 DIAGNOSIS — R600 Localized edema: Secondary | ICD-10-CM

## 2016-12-17 MED ORDER — ACETAMINOPHEN-CODEINE #3 300-30 MG PO TABS: 1.0000 | ORAL_TABLET | ORAL | 0 refills | Status: AC | PRN

## 2016-12-17 MED ORDER — MELOXICAM 15 MG PO TABS: 15.0000 mg | ORAL_TABLET | Freq: Every day | ORAL | 0 refills | Status: DC

## 2016-12-17 MED ORDER — SPIRONOLACTONE 25 MG PO TABS: 25.0000 mg | ORAL_TABLET | Freq: Every day | ORAL | 1 refills | Status: DC

## 2016-12-17 MED ORDER — CARVEDILOL 12.5 MG PO TABS: 12.5000 mg | ORAL_TABLET | Freq: Two times a day (BID) | ORAL | 5 refills | Status: DC

## 2016-12-17 MED ORDER — FUROSEMIDE 40 MG PO TABS: 40.0000 mg | ORAL_TABLET | Freq: Every day | ORAL | 1 refills | Status: DC

## 2016-12-17 MED FILL — ?SPIRONOLACTONE 25 MG TABLE: 25 | 30 days supply | Qty: 30 | Fill #0

## 2016-12-17 MED FILL — FUROSEMIDE 40 MG TABLET: 40 | 30 days supply | Qty: 30 | Fill #0

## 2016-12-17 MED FILL — ?MELOXICAM 15MG TABLET: 15 | 30 days supply | Qty: 30 | Fill #0

## 2016-12-17 MED FILL — CARVEDILOL 12.5 MG TABLET: 12.5 | 30 days supply | Qty: 60 | Fill #0

## 2016-12-17 NOTE — Patient Instructions (Signed)

## 2016-12-17 NOTE — Progress Notes (Signed)
Subjective:  Patient ID: Angela [name], female    DOB: December 06, 1975  Age: 41 y.o. MRN: [id number]  CC: right knee pain, HTN, LE edema  HPI Angela [name] is a 41 y.o. female with a PMH of asthma, depression, and HTN presents with complaint of HTN and LE edema. Last OV at outside clinic approximately four months ago. BP was 158/102 on 08/06/16. Labs including BNP, CBC, and A1c were normal. CMP revealed creatinine of 1.12 and TSH of 5.11. Has taken Triamterene-HCTZ with minimal relief of bilateral LE edema.    Also complains of right knee pain since approximately two years. XR right knee on 10/23/15 was negative. Had MRI ordered but she could not complete due to claustrophobia despite anxiolytic use. Attributed to a fall in the house approximately two years ago. Does not know if she twisted or impacted the knee directly. Has taken Meloxicam 7.5 mg qday with minimal relief. Pain is described as severe with weight bearing. Hears a "popping sound" with motion of the knee. Reports limited extension 2/2 pain.         ROS Review of Systems  Constitutional: Negative for chills, fever and malaise/fatigue.  Eyes: Negative for blurred vision.  Respiratory: Negative for shortness of breath.   Cardiovascular: Positive for leg swelling. Negative for chest pain and palpitations.  Gastrointestinal: Negative for abdominal pain and nausea.  Genitourinary: Negative for dysuria and hematuria.  Musculoskeletal: Positive for joint pain. Negative for myalgias.  Skin: Negative for rash.  Neurological: Negative for tingling and headaches.  Psychiatric/Behavioral: Negative for depression. The patient is not nervous/anxious.     Objective:  BP (!) 159/125 (BP Location: Left Arm, Patient Position: Sitting, Cuff Size: Large)   Pulse (!) 109   Temp 98.2 F (36.8 C) (Oral)   Ht 5' 0.5" (1.537 m)   Wt (!) 322 lb 9.6 oz (146.3 kg)   LMP 11/25/2016 (Exact Date)   SpO2 96%   BMI 61.97 kg/m   BP/Weight 12/17/2016  11/07/3014 0/06/930  Systolic BP 355 732 202  Diastolic BP 542 87 706  Wt. (Lbs) 322.6 - 310.6  BMI 61.97 - 58.69      Physical Exam  Constitutional: She is oriented to person, place, and time.  Morbidly obese, NAD, polite  HENT:  Head: Normocephalic and atraumatic.  Eyes: Conjunctivae are normal. No scleral icterus.  Neck: Normal range of motion. Neck supple.  Difficult to ascertain thyromegaly due to obesity  Cardiovascular: Normal rate, regular rhythm and normal heart sounds.   1+ pitting edema bilaterally  Pulmonary/Chest: Effort normal and breath sounds normal.  Musculoskeletal: She exhibits no edema.  Pt unable to support weight on only the right leg. Pain elicited with anterior/posterior drawer testing, however morbid obesity make visual subluxation sign extremely difficult. There is midline joint tenderness medially and laterally on right knee.  Neurological: She is alert and oriented to person, place, and time. No cranial nerve deficit. Coordination normal.  Skin: Skin is warm and dry. No rash noted. No erythema. No pallor.  Psychiatric: She has a normal mood and affect. Her behavior is normal. Thought content normal.  Vitals reviewed.    Assessment & Plan:   1. Abnormal TSH - Thyroid panel  2. Lower extremity edema - Begin furosemide (LASIX) 40 MG tablet; Take 1 tablet (40 mg total) by mouth daily.  Dispense: 30 tablet; Refill: 1 - Begin spironolactone (ALDACTONE) 25 MG tablet; Take 1 tablet (25 mg total) by mouth daily.  Dispense: 30 tablet; Refill:  1  3. Chronic pain of right knee - Begin meloxicam (MOBIC) 15 MG tablet; Take 1 tablet (15 mg total) by mouth daily.  Dispense: 30 tablet; Refill: 0 - Begin acetaminophen-codeine (TYLENOL #3) 300-30 MG tablet; Take 1 tablet by mouth every 4 (four) hours as needed for moderate pain.  Dispense: 42 tablet; Refill: 0 - MR Knee Right Wo Contrast; Future  4. Hypertension, unspecified type - Stop Triamterene-HCTZ - Continue  Carvedilol 12.5 mg BID - Begin furosemide (LASIX) 40 MG tablet; Take 1 tablet (40 mg total) by mouth daily.  Dispense: 30 tablet; Refill: 1 - Begin spironolactone (ALDACTONE) 25 MG tablet; Take 1 tablet (25 mg total) by mouth daily.  Dispense: 30 tablet; Refill: 1 - CMP - CBC - Thyroid panel  Meds ordered this encounter  Medications  . meloxicam (MOBIC) 15 MG tablet    Sig: Take 1 tablet (15 mg total) by mouth daily.    Dispense:  30 tablet    Refill:  0    Order Specific Question:   Supervising Provider    Answer:   Tresa Garter [id number]  . acetaminophen-codeine (TYLENOL #3) 300-30 MG tablet    Sig: Take 1 tablet by mouth every 4 (four) hours as needed for moderate pain.    Dispense:  42 tablet    Refill:  0    Order Specific Question:   Supervising Provider    Answer:   Tresa Garter [id number]  . furosemide (LASIX) 40 MG tablet    Sig: Take 1 tablet (40 mg total) by mouth daily.    Dispense:  30 tablet    Refill:  1    Order Specific Question:   Supervising Provider    Answer:   Tresa Garter [id number]  . spironolactone (ALDACTONE) 25 MG tablet    Sig: Take 1 tablet (25 mg total) by mouth daily.    Dispense:  30 tablet    Refill:  1    Order Specific Question:   Supervising Provider    Answer:   Tresa Garter [[id number]]    Follow-up: 2 weeks  Clent Demark PA

## 2016-12-18 LAB — THYROID PANEL WITH TSH
FREE THYROXINE INDEX: 2 (ref 1.2–4.9)
T3 Uptake Ratio: 19 % — ABNORMAL LOW (ref 24–39)
T4, Total: 10.6 ug/dL (ref 4.5–12.0)
TSH: 5.55 u[IU]/mL — AB (ref 0.450–4.500)

## 2016-12-18 LAB — COMPREHENSIVE METABOLIC PANEL
A/G RATIO: 1.3 (ref 1.2–2.2)
ALBUMIN: 4 g/dL (ref 3.5–5.5)
ALT: 9 IU/L (ref 0–32)
AST: 11 IU/L (ref 0–40)
Alkaline Phosphatase: 84 IU/L (ref 39–117)
BILIRUBIN TOTAL: 0.2 mg/dL (ref 0.0–1.2)
BUN / CREAT RATIO: 14 (ref 9–23)
BUN: 15 mg/dL (ref 6–24)
CO2: 21 mmol/L (ref 20–29)
Calcium: 9.9 mg/dL (ref 8.7–10.2)
Chloride: 102 mmol/L (ref 96–106)
Creatinine, Ser: 1.05 mg/dL — ABNORMAL HIGH (ref 0.57–1.00)
GFR, EST AFRICAN AMERICAN: 75 mL/min/{1.73_m2} (ref >59)
GFR, EST NON AFRICAN AMERICAN: 65 mL/min/{1.73_m2} (ref >59)
GLOBULIN, TOTAL: 3.2 g/dL (ref 1.5–4.5)
Glucose: 95 mg/dL (ref 65–99)
POTASSIUM: 4.2 mmol/L (ref 3.5–5.2)
SODIUM: 140 mmol/L (ref 134–144)
TOTAL PROTEIN: 7.2 g/dL (ref 6.0–8.5)

## 2016-12-18 LAB — CBC WITH DIFFERENTIAL/PLATELET
BASOS: 0 %
Basophils Absolute: 0 10*3/uL (ref 0.0–0.2)
EOS (ABSOLUTE): 0.3 10*3/uL (ref 0.0–0.4)
Eos: 5 %
HEMOGLOBIN: 12.8 g/dL (ref 11.1–15.9)
Hematocrit: 39.1 % (ref 34.0–46.6)
Immature Grans (Abs): 0 10*3/uL (ref 0.0–0.1)
Immature Granulocytes: 0 %
LYMPHS ABS: 1.7 10*3/uL (ref 0.7–3.1)
Lymphs: 27 %
MCH: 27.5 pg (ref 26.6–33.0)
MCHC: 32.7 g/dL (ref 31.5–35.7)
MCV: 84 fL (ref 79–97)
Monocytes Absolute: 0.3 10*3/uL (ref 0.1–0.9)
Monocytes: 5 %
NEUTROS ABS: 3.9 10*3/uL (ref 1.4–7.0)
Neutrophils: 63 %
PLATELETS: 345 10*3/uL (ref 150–379)
RBC: 4.65 x10E6/uL (ref 3.77–5.28)
RDW: 13.7 % (ref 12.3–15.4)
WBC: 6.2 10*3/uL (ref 3.4–10.8)

## 2016-12-20 ENCOUNTER — Other Ambulatory Visit (INDEPENDENT_AMBULATORY_CARE_PROVIDER_SITE_OTHER): Payer: Self-pay | Admitting: Physician Assistant

## 2016-12-20 DIAGNOSIS — R7989 Other specified abnormal findings of blood chemistry: Secondary | ICD-10-CM

## 2016-12-24 ENCOUNTER — Telehealth (INDEPENDENT_AMBULATORY_CARE_PROVIDER_SITE_OTHER): Payer: Self-pay | Admitting: Physician Assistant

## 2016-12-24 NOTE — Telephone Encounter (Signed)
FWD to PCP. Tempestt S Roberts, CMA  

## 2016-12-24 NOTE — Telephone Encounter (Signed)
Patient called stated having headaches, thinks it is due to medication,  Has f/u appt on 01/01/2017, patient would like to know what she can take for headaches.  Please follow up with patient.

## 2016-12-25 ENCOUNTER — Ambulatory Visit (HOSPITAL_COMMUNITY): Payer: Medicaid Other

## 2016-12-25 NOTE — Telephone Encounter (Signed)
Hydrate properly and return to clinic if still having a headache.

## 2016-12-26 NOTE — Telephone Encounter (Signed)
Patient informed. Tempestt S Roberts, CMA  

## 2017-01-01 ENCOUNTER — Ambulatory Visit (INDEPENDENT_AMBULATORY_CARE_PROVIDER_SITE_OTHER): Payer: Self-pay | Admitting: Physician Assistant

## 2017-01-01 ENCOUNTER — Encounter (INDEPENDENT_AMBULATORY_CARE_PROVIDER_SITE_OTHER): Payer: Self-pay | Admitting: Physician Assistant

## 2017-01-01 VITALS — BP 144/93 | HR 98 | Temp 98.6°F | Wt 322.6 lb

## 2017-01-01 DIAGNOSIS — M25561 Pain in right knee: Secondary | ICD-10-CM

## 2017-01-01 DIAGNOSIS — I10 Essential (primary) hypertension: Secondary | ICD-10-CM

## 2017-01-01 DIAGNOSIS — G8929 Other chronic pain: Secondary | ICD-10-CM

## 2017-01-01 MED ORDER — ACETAMINOPHEN-CODEINE #3 300-30 MG PO TABS: 1.0000 | ORAL_TABLET | Freq: Two times a day (BID) | ORAL | 0 refills | Status: DC

## 2017-01-01 MED ORDER — AMLODIPINE BESYLATE 10 MG PO TABS: 10.0000 mg | ORAL_TABLET | Freq: Every day | ORAL | 3 refills | Status: DC

## 2017-01-01 MED ORDER — SPIRONOLACTONE 50 MG PO TABS: 50.0000 mg | ORAL_TABLET | Freq: Every day | ORAL | 3 refills | Status: DC

## 2017-01-01 MED ORDER — POTASSIUM CHLORIDE ER 10 MEQ PO TBCR: 10.0000 meq | EXTENDED_RELEASE_TABLET | Freq: Every day | ORAL | 5 refills | Status: DC

## 2017-01-01 MED ORDER — FUROSEMIDE 80 MG PO TABS: 80.0000 mg | ORAL_TABLET | Freq: Every day | ORAL | 3 refills | Status: DC

## 2017-01-01 NOTE — Patient Instructions (Signed)
Do not take Potassium pills until you hear the results from your blood work.   Hypertension Hypertension, commonly called high blood pressure, is when the force of blood pumping through the arteries is too strong. The arteries are the blood vessels that carry blood from the heart throughout the body. Hypertension forces the heart to work harder to pump blood and may cause arteries to become narrow or stiff. Having untreated or uncontrolled hypertension can cause heart attacks, strokes, kidney disease, and other problems. A blood pressure reading consists of a higher number over a lower number. Ideally, your blood pressure should be below 120/80. The first ("top") number is called the systolic pressure. It is a measure of the pressure in your arteries as your heart beats. The second ("bottom") number is called the diastolic pressure. It is a measure of the pressure in your arteries as the heart relaxes. What are the causes? The cause of this condition is not known. What increases the risk? Some risk factors for high blood pressure are under your control. Others are not. Factors you can change  Smoking.  Having type 2 diabetes mellitus, high cholesterol, or both.  Not getting enough exercise or physical activity.  Being overweight.  Having too much fat, sugar, calories, or salt (sodium) in your diet.  Drinking too much alcohol. Factors that are difficult or impossible to change  Having chronic kidney disease.  Having a family history of high blood pressure.  Age. Risk increases with age.  Race. You may be at higher risk if you are African-American.  Gender. Men are at higher risk than women before age 69. After age 21, women are at higher risk than men.  Having obstructive sleep apnea.  Stress. What are the signs or symptoms? Extremely high blood pressure (hypertensive crisis) may cause:  Headache.  Anxiety.  Shortness of breath.  Nosebleed.  Nausea and  vomiting.  Severe chest pain.  Jerky movements you cannot control (seizures).  How is this diagnosed? This condition is diagnosed by measuring your blood pressure while you are seated, with your arm resting on a surface. The cuff of the blood pressure monitor will be placed directly against the skin of your upper arm at the level of your heart. It should be measured at least twice using the same arm. Certain conditions can cause a difference in blood pressure between your right and left arms. Certain factors can cause blood pressure readings to be lower or higher than normal (elevated) for a short period of time:  When your blood pressure is higher when you are in a health care provider's office than when you are at home, this is called white coat hypertension. Most people with this condition do not need medicines.  When your blood pressure is higher at home than when you are in a health care provider's office, this is called masked hypertension. Most people with this condition may need medicines to control blood pressure.  If you have a high blood pressure reading during one visit or you have normal blood pressure with other risk factors:  You may be asked to return on a different day to have your blood pressure checked again.  You may be asked to monitor your blood pressure at home for 1 week or longer.  If you are diagnosed with hypertension, you may have other blood or imaging tests to help your health care provider understand your overall risk for other conditions. How is this treated? This condition is treated by making  healthy lifestyle changes, such as eating healthy foods, exercising more, and reducing your alcohol intake. Your health care provider may prescribe medicine if lifestyle changes are not enough to get your blood pressure under control, and if:  Your systolic blood pressure is above 130.  Your diastolic blood pressure is above 80.  Your personal target blood pressure  may vary depending on your medical conditions, your age, and other factors. Follow these instructions at home: Eating and drinking  Eat a diet that is high in fiber and potassium, and low in sodium, added sugar, and fat. An example eating plan is called the DASH (Dietary Approaches to Stop Hypertension) diet. To eat this way: ? Eat plenty of fresh fruits and vegetables. Try to fill half of your plate at each meal with fruits and vegetables. ? Eat whole grains, such as whole wheat pasta, brown rice, or whole grain bread. Fill about one quarter of your plate with whole grains. ? Eat or drink low-fat dairy products, such as skim milk or low-fat yogurt. ? Avoid fatty cuts of meat, processed or cured meats, and poultry with skin. Fill about one quarter of your plate with lean proteins, such as fish, chicken without skin, beans, eggs, and tofu. ? Avoid premade and processed foods. These tend to be higher in sodium, added sugar, and fat.  Reduce your daily sodium intake. Most people with hypertension should eat less than 1,500 mg of sodium a day.  Limit alcohol intake to no more than 1 drink a day for nonpregnant women and 2 drinks a day for men. One drink equals 12 oz of beer, 5 oz of wine, or 1 oz of hard liquor. Lifestyle  Work with your health care provider to maintain a healthy body weight or to lose weight. Ask what an ideal weight is for you.  Get at least 30 minutes of exercise that causes your heart to beat faster (aerobic exercise) most days of the week. Activities may include walking, swimming, or biking.  Include exercise to strengthen your muscles (resistance exercise), such as pilates or lifting weights, as part of your weekly exercise routine. Try to do these types of exercises for 30 minutes at least 3 days a week.  Do not use any products that contain nicotine or tobacco, such as cigarettes and e-cigarettes. If you need help quitting, ask your health care provider.  Monitor your  blood pressure at home as told by your health care provider.  Keep all follow-up visits as told by your health care provider. This is important. Medicines  Take over-the-counter and prescription medicines only as told by your health care provider. Follow directions carefully. Blood pressure medicines must be taken as prescribed.  Do not skip doses of blood pressure medicine. Doing this puts you at risk for problems and can make the medicine less effective.  Ask your health care provider about side effects or reactions to medicines that you should watch for. Contact a health care provider if:  You think you are having a reaction to a medicine you are taking.  You have headaches that keep coming back (recurring).  You feel dizzy.  You have swelling in your ankles.  You have trouble with your vision. Get help right away if:  You develop a severe headache or confusion.  You have unusual weakness or numbness.  You feel faint.  You have severe pain in your chest or abdomen.  You vomit repeatedly.  You have trouble breathing. Summary  Hypertension is when  the force of blood pumping through your arteries is too strong. If this condition is not controlled, it may put you at risk for serious complications.  Your personal target blood pressure may vary depending on your medical conditions, your age, and other factors. For most people, a normal blood pressure is less than 120/80.  Hypertension is treated with lifestyle changes, medicines, or a combination of both. Lifestyle changes include weight loss, eating a healthy, low-sodium diet, exercising more, and limiting alcohol. This information is not intended to replace advice given to you by your health care provider. Make sure you discuss any questions you have with your health care provider. Document Released: 05/20/2005 Document Revised: 04/17/2016 Document Reviewed: 04/17/2016 [employer] Interactive Patient Education  2018 [employer]  

## 2017-01-01 NOTE — Progress Notes (Signed)
Subjective:  Patient ID: Angela [name], female    DOB: 28-Feb-1976  Age: 41 y.o. MRN: [id number]  CC: HTN  HPI Angela [name] is a 41 y.o. female with a PMH of asthma, depression, and HTN presents to f/u on HTN, tachycardia, and LE edema. Was prescribed Lasix 40 mg, Spironolactone 25 mg, and Carvedilol 12.5 mg. Tachycardia has subsided to 98 BPM and current BP 144/93 is higher than previous visit BP 131/84. Says feet remained edematous and she now has a headache. Reports eating once a day and read she should have carvedilol with meals. Also states to having felt a strange sensation in the chest upon starting carvedilol. Does not endorse chest pain, SOB, abdominal pain, f/c/n/v, rash, or GI/GU sxs.   Outpatient Medications Prior to Visit  Medication Sig Dispense Refill  . carvedilol (COREG) 12.5 MG tablet Take 1 tablet (12.5 mg total) by mouth 2 (two) times daily with a meal. 60 tablet 5  . furosemide (LASIX) 40 MG tablet Take 1 tablet (40 mg total) by mouth daily. 30 tablet 1  . meloxicam (MOBIC) 15 MG tablet Take 1 tablet (15 mg total) by mouth daily. 30 tablet 0  . spironolactone (ALDACTONE) 25 MG tablet Take 1 tablet (25 mg total) by mouth daily. 30 tablet 1  . acetaminophen (TYLENOL) 500 MG tablet Take 1,000 mg by mouth every 6 (six) hours as needed for moderate pain or headache (headach).     Marland Kitchen albuterol (PROVENTIL HFA;VENTOLIN HFA) 108 (90 Base) MCG/ACT inhaler Inhale 1-2 puffs into the lungs every 6 (six) hours as needed for wheezing or shortness of breath (wheezing). (Patient not taking: Reported on 12/17/2016) 1 Inhaler prn   No facility-administered medications prior to visit.      ROS Review of Systems  Constitutional: Negative for chills, fever and malaise/fatigue.  Eyes: Negative for blurred vision.  Respiratory: Negative for shortness of breath.   Cardiovascular: Negative for chest pain and palpitations.  Gastrointestinal: Negative for abdominal pain and nausea.   Genitourinary: Negative for dysuria and hematuria.  Musculoskeletal: Negative for joint pain and myalgias.  Skin: Negative for rash.  Neurological: Positive for headaches. Negative for tingling.  Psychiatric/Behavioral: Negative for depression. The patient is not nervous/anxious.     Objective:  BP (!) 144/93 (BP Location: Left Arm, Patient Position: Sitting, Cuff Size: Large)   Pulse 98   Temp 98.6 F (37 C) (Oral)   Wt (!) 322 lb 9.6 oz (146.3 kg)   LMP 12/24/2016   SpO2 94%   BMI 61.97 kg/m   BP/Weight 01/01/2017 12/17/2016 02/01/5175  Systolic BP 160 737 106  Diastolic BP 93 84 87  Wt. (Lbs) 322.6 322.6 -  BMI 61.97 61.97 -      Physical Exam  Constitutional: She is oriented to person, place, and time.  Morbidly obese, NAD, polite  HENT:  Head: Normocephalic and atraumatic.  Eyes: No scleral icterus.  Neck: Normal range of motion.  Cardiovascular: Normal rate, regular rhythm and normal heart sounds.   Bilateral LE edema  Pulmonary/Chest: Effort normal and breath sounds normal. No respiratory distress.  Musculoskeletal: She exhibits no edema.  Neurological: She is alert and oriented to person, place, and time. No cranial nerve deficit. Coordination normal.  Skin: Skin is warm and dry. No rash noted. No erythema. No pallor.  Psychiatric: She has a normal mood and affect. Her behavior is normal. Thought content normal.  Vitals reviewed.    Assessment & Plan:   1. Hypertension, unspecified type -  Basic Metabolic Panel - Begin amLODipine (NORVASC) 10 MG tablet; Take 1 tablet (10 mg total) by mouth daily.  Dispense: 30 tablet; Refill: 3 - Begin potassium chloride (K-DUR) 10 MEQ tablet; Take 1 tablet (10 mEq total) by mouth daily.  Dispense: 30 tablet; Refill: 5. DO NOT START UNTIL DIRECTED TO DO SO. - Increase furosemide (LASIX) 80 MG tablet; Take 1 tablet (80 mg total) by mouth daily.  Dispense: 30 tablet; Refill: 3 - Increase spironolactone (ALDACTONE) 50 MG tablet;  Take 1 tablet (50 mg total) by mouth daily.  Dispense: 30 tablet; Refill: 3  2. Chronic pain of right knee - Begin acetaminophen-codeine (TYLENOL #3) 300-30 MG tablet; Take 1 tablet by mouth 2 (two) times daily.  Dispense: 60 tablet; Refill: 0. Patient had not been able to pick up her last rx. Rx was inadvertently not given to patient at the last visit.    Meds ordered this encounter  Medications  . amLODipine (NORVASC) 10 MG tablet    Sig: Take 1 tablet (10 mg total) by mouth daily.    Dispense:  30 tablet    Refill:  3    Order Specific Question:   Supervising Provider    Answer:   Tresa Garter [id number]  . potassium chloride (K-DUR) 10 MEQ tablet    Sig: Take 1 tablet (10 mEq total) by mouth daily.    Dispense:  30 tablet    Refill:  5    Order Specific Question:   Supervising Provider    Answer:   Tresa Garter [id number]  . furosemide (LASIX) 80 MG tablet    Sig: Take 1 tablet (80 mg total) by mouth daily.    Dispense:  30 tablet    Refill:  3    Order Specific Question:   Supervising Provider    Answer:   Tresa Garter [id number]  . spironolactone (ALDACTONE) 50 MG tablet    Sig: Take 1 tablet (50 mg total) by mouth daily.    Dispense:  30 tablet    Refill:  3    Order Specific Question:   Supervising Provider    Answer:   Tresa Garter [id number]  . acetaminophen-codeine (TYLENOL #3) 300-30 MG tablet    Sig: Take 1 tablet by mouth 2 (two) times daily.    Dispense:  60 tablet    Refill:  0    Order Specific Question:   Supervising Provider    Answer:   Tresa Garter [id number]    Follow-up: Return in about 4 weeks (around 01/29/2017).   Clent Demark PA

## 2017-01-02 ENCOUNTER — Telehealth (INDEPENDENT_AMBULATORY_CARE_PROVIDER_SITE_OTHER): Payer: Self-pay | Admitting: Physician Assistant

## 2017-01-02 ENCOUNTER — Other Ambulatory Visit (INDEPENDENT_AMBULATORY_CARE_PROVIDER_SITE_OTHER): Payer: Self-pay | Admitting: Physician Assistant

## 2017-01-02 ENCOUNTER — Ambulatory Visit (HOSPITAL_COMMUNITY)
Admission: RE | Admit: 2017-01-02 | Discharge: 2017-01-02 | Disposition: A | Discharge status: Home / Self Care | Payer: Medicaid Other | Source: Ambulatory Visit | Attending: Physician Assistant | Admitting: Physician Assistant

## 2017-01-02 ENCOUNTER — Encounter (HOSPITAL_COMMUNITY): Payer: Self-pay

## 2017-01-02 DIAGNOSIS — G8929 Other chronic pain: Secondary | ICD-10-CM

## 2017-01-02 DIAGNOSIS — F40240 Claustrophobia: Secondary | ICD-10-CM

## 2017-01-02 DIAGNOSIS — M25561 Pain in right knee: Principal | ICD-10-CM

## 2017-01-02 LAB — BASIC METABOLIC PANEL
BUN/Creatinine Ratio: 9 (ref 9–23)
BUN: 10 mg/dL (ref 6–24)
CALCIUM: 9.5 mg/dL (ref 8.7–10.2)
CO2: 25 mmol/L (ref 20–29)
Chloride: 106 mmol/L (ref 96–106)
Creatinine, Ser: 1.06 mg/dL — ABNORMAL HIGH (ref 0.57–1.00)
GFR calc Af Amer: 74 mL/min/{1.73_m2} (ref >59)
GFR, EST NON AFRICAN AMERICAN: 64 mL/min/{1.73_m2} (ref >59)
Glucose: 102 mg/dL — ABNORMAL HIGH (ref 65–99)
POTASSIUM: 4.1 mmol/L (ref 3.5–5.2)
Sodium: 144 mmol/L (ref 134–144)

## 2017-01-02 MED ORDER — ALPRAZOLAM 2 MG PO TABS: 2.0000 mg | ORAL_TABLET | Freq: Once | ORAL | 0 refills | Status: AC

## 2017-01-02 NOTE — Telephone Encounter (Signed)
Pt called since she went to try to do the MRI but since she is CLAUSTROFOBIA, she need a prescription for it, she has to reschedule the appt., she need a prescription for this issue, please follow up

## 2017-01-02 NOTE — Telephone Encounter (Signed)
FWD to PCP. Angela [name] Angela [name], CMA  

## 2017-01-02 NOTE — Telephone Encounter (Signed)
Come pick up rx. Take xanax 2mg , 1-2 hours before MRI.

## 2017-01-03 NOTE — Telephone Encounter (Signed)
Patient knows to come pick up Rx and how to take Rx. Nat Christen, CMA

## 2017-02-11 ENCOUNTER — Encounter (INDEPENDENT_AMBULATORY_CARE_PROVIDER_SITE_OTHER): Payer: Self-pay | Admitting: Physician Assistant

## 2017-02-11 ENCOUNTER — Ambulatory Visit (INDEPENDENT_AMBULATORY_CARE_PROVIDER_SITE_OTHER): Payer: Self-pay | Admitting: Physician Assistant

## 2017-02-11 VITALS — BP 156/96 | HR 99 | Temp 98.6°F | Resp 20 | Ht 60.0 in | Wt 301.0 lb

## 2017-02-11 DIAGNOSIS — I10 Essential (primary) hypertension: Secondary | ICD-10-CM

## 2017-02-11 DIAGNOSIS — F339 Major depressive disorder, recurrent, unspecified: Secondary | ICD-10-CM

## 2017-02-11 DIAGNOSIS — R600 Localized edema: Secondary | ICD-10-CM

## 2017-02-11 MED ORDER — CLONIDINE HCL 0.1 MG PO TABS: 0.1000 mg | ORAL_TABLET | Freq: Three times a day (TID) | ORAL | 3 refills | Status: DC

## 2017-02-11 MED ORDER — ESCITALOPRAM OXALATE 20 MG PO TABS: 20.0000 mg | ORAL_TABLET | Freq: Every day | ORAL | 1 refills | Status: DC

## 2017-02-11 NOTE — Progress Notes (Signed)
Subjective:  Patient ID: Angela [name], female    DOB: [birth year]  Age: 41 y.o. MRN: [id number]  CC: ankle swelling bilaterally  HPI  Angela L Hooksis a 41 y.o.femalewith a PMH of asthma, depression, and HTN presents to f/u on HTN, tachycardia, and LE edema. She was prescribed Lasix 80 mg, Spironolactone 50 mg and Amlodipine 10 mg. Believes the diuretics are working as she is urinating more often. However, her ankles still swell and have become painful 2/2 the "pressure" she feels. Does not endorse CP, palpitations, SOB, HA, tingling, numbness, presyncope, abdominal pain, rash, or GI/GU sxs.    Patient wants to discuss depression. Has been depressed since her youth. "Everything" has made her depressed. No suicidal ideation/intent.     Outpatient Medications Prior to Visit  Medication Sig Dispense Refill  . acetaminophen (TYLENOL) 500 MG tablet Take 1,000 mg by mouth every 6 (six) hours as needed for moderate pain or headache (headach).     Marland Kitchen acetaminophen-codeine (TYLENOL #3) 300-30 MG tablet Take 1 tablet by mouth 2 (two) times daily. 60 tablet 0  . albuterol (PROVENTIL HFA;VENTOLIN HFA) 108 (90 Base) MCG/ACT inhaler Inhale 1-2 puffs into the lungs every 6 (six) hours as needed for wheezing or shortness of breath (wheezing). 1 Inhaler prn  . amLODipine (NORVASC) 10 MG tablet Take 1 tablet (10 mg total) by mouth daily. 30 tablet 3  . furosemide (LASIX) 80 MG tablet Take 1 tablet (80 mg total) by mouth daily. 30 tablet 3  . meloxicam (MOBIC) 15 MG tablet Take 1 tablet (15 mg total) by mouth daily. 30 tablet 0  . potassium chloride (K-DUR) 10 MEQ tablet Take 1 tablet (10 mEq total) by mouth daily. 30 tablet 5  . spironolactone (ALDACTONE) 50 MG tablet Take 1 tablet (50 mg total) by mouth daily. 30 tablet 3   No facility-administered medications prior to visit.      ROS Review of Systems  Constitutional: Negative for chills, fever and malaise/fatigue.  Eyes: Negative for blurred  vision.  Respiratory: Negative for shortness of breath.   Cardiovascular: Positive for leg swelling. Negative for chest pain and palpitations.  Gastrointestinal: Negative for abdominal pain and nausea.  Genitourinary: Negative for dysuria and hematuria.  Musculoskeletal: Positive for joint pain. Negative for myalgias.  Skin: Negative for rash.  Neurological: Negative for tingling and headaches.  Psychiatric/Behavioral: Negative for depression. The patient is not nervous/anxious.     Objective:  BP (!) 156/96 (BP Location: Left Arm, Patient Position: Sitting, Cuff Size: Large)   Pulse 99   Temp 98.6 F (37 C) (Oral)   Resp 20   Ht 5' (1.524 m)   Wt (!) 301 lb (136.5 kg)   LMP 01/18/2017   SpO2 98%   BMI 58.79 kg/m   BP/Weight 02/11/2017 01/01/2017 3/41/9622  Systolic BP 297 989 211  Diastolic BP 96 93 84  Wt. (Lbs) 301 322.6 322.6  BMI 58.79 61.97 61.97      Physical Exam  Constitutional: She is oriented to person, place, and time.  Well developed, morbidly obese, NAD, polite  HENT:  Head: Normocephalic and atraumatic.  Eyes: No scleral icterus.  Cardiovascular: Normal rate, regular rhythm and normal heart sounds.   Pulmonary/Chest: Effort normal and breath sounds normal.  Musculoskeletal:  Bilateral 1+ pitting edema of the lower extremities.  Neurological: She is alert and oriented to person, place, and time. No cranial nerve deficit. Coordination normal.  Antalgic gait  Skin: Skin is warm and dry. No  rash noted. No erythema. No pallor.  Psychiatric: Her behavior is normal. Thought content normal.  tearful  Vitals reviewed.    Assessment & Plan:    1. Hypertension, unspecified type - Stop Amlodipine - Begin cloNIDine (CATAPRES) 0.1 MG tablet; Take 1 tablet (0.1 mg total) by mouth 3 (three) times daily.  Dispense: 90 tablet; Refill: 3 - Basic Metabolic Panel  2. Lower extremity edema bilaterally - Stop Amlodipine.  - Continue Spironolactone and Lasix. -  BMP  3. Recurrent major depressive disorder, remission status unspecified (HCC) - Begin escitalopram (LEXAPRO) 20 MG tablet; Take 1 tablet (20 mg total) by mouth daily.  Dispense: 30 tablet; Refill: 1 - I have advised that patient return in 72 hours to further discuss her depression.  Meds ordered this encounter  Medications  . cloNIDine (CATAPRES) 0.1 MG tablet    Sig: Take 1 tablet (0.1 mg total) by mouth 3 (three) times daily.    Dispense:  90 tablet    Refill:  3    Order Specific Question:   Supervising Provider    Answer:   Tresa Garter [id number]  . escitalopram (LEXAPRO) 20 MG tablet    Sig: Take 1 tablet (20 mg total) by mouth daily.    Dispense:  30 tablet    Refill:  1    Order Specific Question:   Supervising Provider    Answer:   Tresa Garter [id number]    Follow-up: Return in about 3 days (around 02/14/2017).   Clent Demark PA

## 2017-02-11 NOTE — Patient Instructions (Addendum)
Edema Edema is when you have too much fluid in your body or under your skin. Edema may make your legs, feet, and ankles swell up. Swelling is also common in looser tissues, like around your eyes. This is a common condition. It gets more common as you get older. There are many possible causes of edema. Eating too much salt (sodium) and being on your feet or sitting for a long time can cause edema in your legs, feet, and ankles. Hot weather may make edema worse. Edema is usually painless. Your skin may look swollen or shiny. Follow these instructions at home:  Keep the swollen body part raised (elevated) above the level of your heart when you are sitting or lying down.  Do not sit still or stand for a long time.  Do not wear tight clothes. Do not wear garters on your upper legs.  Exercise your legs. This can help the swelling go down.  Wear elastic bandages or support stockings as told by your doctor.  Eat a low-salt (low-sodium) diet to reduce fluid as told by your doctor.  Depending on the cause of your swelling, you may need to limit how much fluid you drink (fluid restriction).  Take over-the-counter and prescription medicines only as told by your doctor. Contact a doctor if:  Treatment is not working.  You have heart, liver, or kidney disease and have symptoms of edema.  You have sudden and unexplained weight gain. Get help right away if:  You have shortness of breath or chest pain.  You cannot breathe when you lie down.  You have pain, redness, or warmth in the swollen areas.  You have heart, liver, or kidney disease and get edema all of a sudden.  You have a fever and your symptoms get worse all of a sudden. Summary  Edema is when you have too much fluid in your body or under your skin.  Edema may make your legs, feet, and ankles swell up. Swelling is also common in looser tissues, like around your eyes.  Raise (elevate) the swollen body part above the level of your  heart when you are sitting or lying down.  Follow your doctor's instructions about diet and how much fluid you can drink (fluid restriction). This information is not intended to replace advice given to you by your health care provider. Make sure you discuss any questions you have with your health care provider. Document Released: 11/06/2007 Document Revised: 06/07/2016 Document Reviewed: 06/07/2016 Elsevier Interactive Patient Education  2017 [place] With Depression Everyone experiences occasional disappointment, sadness, and loss in their lives. When you are feeling down, blue, or sad for at least 2 weeks in a row, it may mean that you have depression. Depression can affect your thoughts and feelings, relationships, daily activities, and physical health. It is caused by changes in the way your brain functions. If you receive a diagnosis of depression, your health care provider will tell you which type of depression you have and what treatment options are available to you. If you are living with depression, there are ways to help you recover from it and also ways to prevent it from coming back. How to cope with lifestyle changes Coping with stress Stress is your body's reaction to life changes and events, both good and bad. Stressful situations may include:  Getting married.  The death of a spouse.  Losing a job.  Retiring.  Having a baby.  Stress can last just a few hours or  it can be ongoing. Stress can play a major role in depression, so it is important to learn both how to cope with stress and how to think about it differently. Talk with your health care provider or a counselor if you would like to learn more about stress reduction. He or she may suggest some stress reduction techniques, such as:  Music therapy. This can include creating music or listening to music. Choose music that you enjoy and that inspires you.  Mindfulness-based meditation. This kind of meditation  can be done while sitting or walking. It involves being aware of your normal breaths, rather than trying to control your breathing.  Centering prayer. This is a kind of meditation that involves focusing on a spiritual word or phrase. Choose a word, phrase, or sacred image that is meaningful to you and that brings you peace.  Deep breathing. To do this, expand your stomach and inhale slowly through your nose. Hold your breath for 3-5 seconds, then exhale slowly, allowing your stomach muscles to relax.  Muscle relaxation. This involves intentionally tensing muscles then relaxing them.  Choose a stress reduction technique that fits your lifestyle and personality. Stress reduction techniques take time and practice to develop. Set aside 5-15 minutes a day to do them. Therapists can offer training in these techniques. The training may be covered by some insurance plans. Other things you can do to manage stress include:  Keeping a stress diary. This can help you learn what triggers your stress and ways to control your response.  Understanding what your limits are and saying no to requests or events that lead to a schedule that is too full.  Thinking about how you respond to certain situations. You may not be able to control everything, but you can control how you react.  Adding humor to your life by watching funny films or TV shows.  Making time for activities that help you relax and not feeling guilty about spending your time this way.  Medicines Your health care provider may suggest certain medicines if he or she feels that they will help improve your condition. Avoid using alcohol and other substances that may prevent your medicines from working properly (may interact). It is also important to:  Talk with your pharmacist or health care provider about all the medicines that you take, their possible side effects, and what medicines are safe to take together.  Make it your goal to take part in all  treatment decisions (shared decision-making). This includes giving input on the side effects of medicines. It is best if shared decision-making with your health care provider is part of your total treatment plan.  If your health care provider prescribes a medicine, you may not notice the full benefits of it for 4-8 weeks. Most people who are treated for depression need to be on medicine for at least 6-12 months after they feel better. If you are taking medicines as part of your treatment, do not stop taking medicines without first talking to your health care provider. You may need to have the medicine slowly decreased (tapered) over time to decrease the risk of harmful side effects. Relationships Your health care provider may suggest family therapy along with individual therapy and drug therapy. While there may not be family problems that are causing you to feel depressed, it is still important to make sure your family learns as much as they can about your mental health. Having your family's support can help make your treatment successful. How  to recognize changes in your condition Everyone has a different response to treatment for depression. Recovery from major depression happens when you have not had signs of major depression for two months. This may mean that you will start to:  Have more interest in doing activities.  Feel less hopeless than you did 2 months ago.  Have more energy.  Overeat less often, or have better or improving appetite.  Have better concentration.  Your health care provider will work with you to decide the next steps in your recovery. It is also important to recognize when your condition is getting worse. Watch for these signs:  Having fatigue or low energy.  Eating too much or too little.  Sleeping too much or too little.  Feeling restless, agitated, or hopeless.  Having trouble concentrating or making decisions.  Having unexplained physical  complaints.  Feeling irritable, angry, or aggressive.  Get help as soon as you or your family members notice these symptoms coming back. How to get support and help from others How to talk with friends and family members about your condition Talking to friends and family members about your condition can provide you with one way to get support and guidance. Reach out to trusted friends or family members, explain your symptoms to them, and let them know that you are working with a health care provider to treat your depression. Financial resources Not all insurance plans cover mental health care, so it is important to check with your insurance carrier. If paying for co-pays or counseling services is a problem, search for a local or county mental health care center. They may be able to offer public mental health care services at low or no cost when you are not able to see a private health care provider. If you are taking medicine for depression, you may be able to get the generic form, which may be less expensive. Some makers of prescription medicines also offer help to patients who cannot afford the medicines they need. Follow these instructions at home:  Get the right amount and quality of sleep.  Cut down on using caffeine, tobacco, alcohol, and other potentially harmful substances.  Try to exercise, such as walking or lifting small weights.  Take over-the-counter and prescription medicines only as told by your health care provider.  Eat a healthy diet that includes plenty of vegetables, fruits, whole grains, low-fat dairy products, and lean protein. Do not eat a lot of foods that are high in solid fats, added sugars, or salt.  Keep all follow-up visits as told by your health care provider. This is important. Contact a health care provider if:  You stop taking your antidepressant medicines, and you have any of these symptoms: ? Nausea. ? Headache. ? Feeling lightheaded. ? Chills and body  aches. ? Not being able to sleep (insomnia).  You or your friends and family think your depression is getting worse. Get help right away if:  You have thoughts of hurting yourself or others. If you ever feel like you may hurt yourself or others, or have thoughts about taking your own life, get help right away. You can go to your nearest emergency department or call:  Your local emergency services (911 in the [place]  A suicide crisis helpline, such as the [place] at 6142799097. This is open 24-hours a day.  Summary  If you are living with depression, there are ways to help you recover from it and also ways to prevent it  from coming back.  Work with your health care team to create a management plan that includes counseling, stress management techniques, and healthy lifestyle habits. This information is not intended to replace advice given to you by your health care provider. Make sure you discuss any questions you have with your health care provider. Document Released: 04/22/2016 Document Revised: 04/22/2016 Document Reviewed: 04/22/2016 Elsevier Interactive Patient Education  [employer].

## 2017-02-12 MED FILL — ESCITALOPRAM 20 MG TABLET: 20 | 30 days supply | Qty: 30 | Fill #0

## 2017-02-12 MED FILL — ?FUROSEMIDE 80MG TABLET: 80 | 30 days supply | Qty: 30 | Fill #0

## 2017-02-12 MED FILL — cloNIDine HCL 0.1 MG TABS: 0.1 | 30 days supply | Qty: 90 | Fill #0

## 2017-02-12 MED FILL — POTASSIUM CL 10 MEQ TAB SA: 10 | 30 days supply | Qty: 30 | Fill #0

## 2017-02-12 MED FILL — ?SPIRONOLACTONE 50 MG TABLE: 50 | 30 days supply | Qty: 30 | Fill #0

## 2017-02-14 ENCOUNTER — Ambulatory Visit (INDEPENDENT_AMBULATORY_CARE_PROVIDER_SITE_OTHER): Payer: Self-pay | Admitting: Physician Assistant

## 2017-02-18 ENCOUNTER — Ambulatory Visit (INDEPENDENT_AMBULATORY_CARE_PROVIDER_SITE_OTHER): Payer: Self-pay | Admitting: Physician Assistant

## 2017-02-18 ENCOUNTER — Encounter (INDEPENDENT_AMBULATORY_CARE_PROVIDER_SITE_OTHER): Payer: Self-pay | Admitting: Physician Assistant

## 2017-02-18 VITALS — BP 153/104 | HR 82 | Temp 98.3°F | Wt 328.0 lb

## 2017-02-18 DIAGNOSIS — R946 Abnormal results of thyroid function studies: Secondary | ICD-10-CM

## 2017-02-18 DIAGNOSIS — I10 Essential (primary) hypertension: Secondary | ICD-10-CM

## 2017-02-18 DIAGNOSIS — R600 Localized edema: Secondary | ICD-10-CM

## 2017-02-18 MED ORDER — SPIRONOLACTONE 50 MG PO TABS: 100.0000 mg | ORAL_TABLET | Freq: Every day | ORAL | 2 refills | Status: DC

## 2017-02-18 MED ORDER — FUROSEMIDE 80 MG PO TABS: 160.0000 mg | ORAL_TABLET | Freq: Every day | ORAL | 2 refills | Status: DC

## 2017-02-18 NOTE — Patient Instructions (Signed)
Edema Edema is when you have too much fluid in your body or under your skin. Edema may make your legs, feet, and ankles swell up. Swelling is also common in looser tissues, like around your eyes. This is a common condition. It gets more common as you get older. There are many possible causes of edema. Eating too much salt (sodium) and being on your feet or sitting for a long time can cause edema in your legs, feet, and ankles. Hot weather may make edema worse. Edema is usually painless. Your skin may look swollen or shiny. Follow these instructions at home:  Keep the swollen body part raised (elevated) above the level of your heart when you are sitting or lying down.  Do not sit still or stand for a long time.  Do not wear tight clothes. Do not wear garters on your upper legs.  Exercise your legs. This can help the swelling go down.  Wear elastic bandages or support stockings as told by your doctor.  Eat a low-salt (low-sodium) diet to reduce fluid as told by your doctor.  Depending on the cause of your swelling, you may need to limit how much fluid you drink (fluid restriction).  Take over-the-counter and prescription medicines only as told by your doctor. Contact a doctor if:  Treatment is not working.  You have heart, liver, or kidney disease and have symptoms of edema.  You have sudden and unexplained weight gain. Get help right away if:  You have shortness of breath or chest pain.  You cannot breathe when you lie down.  You have pain, redness, or warmth in the swollen areas.  You have heart, liver, or kidney disease and get edema all of a sudden.  You have a fever and your symptoms get worse all of a sudden. Summary  Edema is when you have too much fluid in your body or under your skin.  Edema may make your legs, feet, and ankles swell up. Swelling is also common in looser tissues, like around your eyes.  Raise (elevate) the swollen body part above the level of your  heart when you are sitting or lying down.  Follow your doctor's instructions about diet and how much fluid you can drink (fluid restriction). This information is not intended to replace advice given to you by your health care provider. Make sure you discuss any questions you have with your health care provider. Document Released: 11/06/2007 Document Revised: 06/07/2016 Document Reviewed: 06/07/2016 Elsevier Interactive Patient Education  2017 [employer]  

## 2017-02-18 NOTE — Progress Notes (Signed)
Subjective:  Patient ID: Angela [name], female    DOB: 01/08/1976  Age: 41 y.o. MRN: [id number]  CC: HTN  HPI  Angela Sittner Hooksis a 41 y.o.femalewith a PMH of asthma, depression, and HTN presents to f/u on HTN and bilateral LE edema. She was prescribed Lasix 80 mg qday, Spironolactone 50 mg qday and discontinued on Amlodipine 10 mg. Amlodipine replaced with Clonidine 0.1 mg. Believes the diuretics are working as she is urinating more often and there has been mild reduction in LE edema. However, her ankles still swell and have become painful 2/2 the "pressure" she feels. Does not endorse CP, palpitations, SOB, HA, tingling, numbness, presyncope, abdominal pain, rash, or GI/GU sxs. Has not taken fluid pill yet today because she was going out and did not want to have to urinate so frequently. Does not checked her BP regularly. Has a wrist BP monitor at home but can't find it.    In regards to depression. Has not started taking escitalopram yet because she is "scared" of side effects. Continues feeling depressed and worse so because of the LE edema.   Outpatient Medications Prior to Visit  Medication Sig Dispense Refill  . acetaminophen (TYLENOL) 500 MG tablet Take 1,000 mg by mouth every 6 (six) hours as needed for moderate pain or headache (headach).     Marland Kitchen acetaminophen-codeine (TYLENOL #3) 300-30 MG tablet Take 1 tablet by mouth 2 (two) times daily. 60 tablet 0  . albuterol (PROVENTIL HFA;VENTOLIN HFA) 108 (90 Base) MCG/ACT inhaler Inhale 1-2 puffs into the lungs every 6 (six) hours as needed for wheezing or shortness of breath (wheezing). 1 Inhaler prn  . cloNIDine (CATAPRES) 0.1 MG tablet Take 1 tablet (0.1 mg total) by mouth 3 (three) times daily. 90 tablet 3  . escitalopram (LEXAPRO) 20 MG tablet Take 1 tablet (20 mg total) by mouth daily. 30 tablet 1  . furosemide (LASIX) 80 MG tablet Take 1 tablet (80 mg total) by mouth daily. 30 tablet 3  . meloxicam (MOBIC) 15 MG tablet Take 1  tablet (15 mg total) by mouth daily. 30 tablet 0  . potassium chloride (K-DUR) 10 MEQ tablet Take 1 tablet (10 mEq total) by mouth daily. 30 tablet 5  . spironolactone (ALDACTONE) 50 MG tablet Take 1 tablet (50 mg total) by mouth daily. 30 tablet 3   No facility-administered medications prior to visit.      ROS Review of Systems  Constitutional: Negative for chills, fever and malaise/fatigue.  Eyes: Negative for blurred vision.  Respiratory: Negative for shortness of breath.   Cardiovascular: Positive for leg swelling. Negative for chest pain and palpitations.  Gastrointestinal: Negative for abdominal pain and nausea.  Genitourinary: Negative for dysuria and hematuria.  Musculoskeletal: Negative for joint pain and myalgias.  Skin: Negative for rash.  Neurological: Negative for tingling and headaches.  Psychiatric/Behavioral: Negative for depression. The patient is not nervous/anxious.     Objective:  BP (!) 153/104 (BP Location: Left Arm, Patient Position: Sitting, Cuff Size: Large)   Pulse 82   Temp 98.3 F (36.8 C) (Oral)   Wt (!) 328 lb (148.8 kg)   LMP 02/14/2017 (Exact Date)   SpO2 97%   BMI 64.06 kg/m   BP/Weight 02/18/2017 09/25/9561 01/07/5642  Systolic BP 329 518 841  Diastolic BP 660 96 93  Wt. (Lbs) 328 301 322.6  BMI 64.06 58.79 61.97      Physical Exam  Constitutional: She is oriented to person, place, and time.  Well  developed, morbidly obese, NAD, polite  HENT:  Head: Normocephalic and atraumatic.  Eyes: No scleral icterus.  Neck: Normal range of motion. Neck supple. No thyromegaly present.  Cardiovascular: Normal rate, regular rhythm and normal heart sounds.  Exam reveals no gallop and no friction rub.   No murmur heard. 2+ pitting edema bilaterally  Pulmonary/Chest: Effort normal and breath sounds normal. No respiratory distress. She has no wheezes. She has no rales.  Neurological: She is alert and oriented to person, place, and time. No cranial nerve  deficit. Coordination normal.  Skin: Skin is warm and dry. No rash noted. No erythema. No pallor.  Psychiatric: Her behavior is normal. Thought content normal.  Somewhat depressed, constricted affect  Vitals reviewed.    Assessment & Plan:    1. Leg edema - Weight over the past 4 visits since 12/17/16 has been (chronologically) 322, 322, 301, and 328. Patient has not taken diuretic or Clonidine today. - BMP drawn today - Increase furosemide (LASIX) 80 MG tablet; Take 2 tablets (160 mg total) by mouth daily.  Dispense: 60 tablet; Refill: 2 - Increase spironolactone (ALDACTONE) 50 MG tablet; Take 2 tablets (100 mg total) by mouth daily.  Dispense: 60 tablet; Refill: 2 - I have asked for patient to call me next Monday or Tuesday (6-7 days) to tell me if increase of diuretics have helped. If there is no relief of edema or blood pressure, then I will refer urgently to cardiology.   2. Hypertension, unspecified type - Increase furosemide (LASIX) 80 MG tablet; Take 2 tablets (160 mg total) by mouth daily.  Dispense: 60 tablet; Refill: 2 - Increase spironolactone (ALDACTONE) 50 MG tablet; Take 2 tablets (100 mg total) by mouth daily.  Dispense: 60 tablet; Refill: 2  3. Abnormal results of thyroid function studies - Has received a call from the referral specialist but states she does not know what has come of her endocrinology appointment.   Meds ordered this encounter  Medications  . furosemide (LASIX) 80 MG tablet    Sig: Take 2 tablets (160 mg total) by mouth daily.    Dispense:  60 tablet    Refill:  2    Order Specific Question:   Supervising Provider    Answer:   Angela [name] [id number]  . spironolactone (ALDACTONE) 50 MG tablet    Sig: Take 2 tablets (100 mg total) by mouth daily.    Dispense:  60 tablet    Refill:  2    Order Specific Question:   Supervising Provider    Answer:   Angela [name] [[id number]]    Follow-up: Return for call on Monday or Tuesday to report  LE edema status.   Clent Demark PA

## 2017-02-20 LAB — BASIC METABOLIC PANEL
BUN / CREAT RATIO: 12 (ref 9–23)
BUN: 12 mg/dL (ref 6–24)
CALCIUM: 9.5 mg/dL (ref 8.7–10.2)
CHLORIDE: 105 mmol/L (ref 96–106)
CO2: 21 mmol/L (ref 20–29)
Creatinine, Ser: 0.98 mg/dL (ref 0.57–1.00)
GFR, EST AFRICAN AMERICAN: 82 mL/min/{1.73_m2} (ref >59)
GFR, EST NON AFRICAN AMERICAN: 71 mL/min/{1.73_m2} (ref >59)
Glucose: 93 mg/dL (ref 65–99)
POTASSIUM: 4.3 mmol/L (ref 3.5–5.2)
SODIUM: 143 mmol/L (ref 134–144)

## 2017-02-21 ENCOUNTER — Telehealth (INDEPENDENT_AMBULATORY_CARE_PROVIDER_SITE_OTHER): Payer: Self-pay

## 2017-02-21 NOTE — Telephone Encounter (Signed)
-----   Message from Clent Demark, PA-C sent at 02/20/2017  5:47 PM EDT ----- Labs normal.

## 2017-02-21 NOTE — Telephone Encounter (Signed)
Patient aware of normal results. Nat Christen, CMA

## 2017-02-25 ENCOUNTER — Ambulatory Visit (INDEPENDENT_AMBULATORY_CARE_PROVIDER_SITE_OTHER): Payer: Self-pay | Admitting: Physician Assistant

## 2017-03-25 ENCOUNTER — Encounter (HOSPITAL_COMMUNITY): Payer: Self-pay | Admitting: Emergency Medicine

## 2017-03-25 ENCOUNTER — Emergency Department (HOSPITAL_COMMUNITY)
Admission: EM | Admit: 2017-03-25 | Discharge: 2017-03-26 | Disposition: A | Discharge status: Home / Self Care | Payer: Self-pay | Attending: Emergency Medicine | Admitting: Emergency Medicine

## 2017-03-25 DIAGNOSIS — R2243 Localized swelling, mass and lump, lower limb, bilateral: Secondary | ICD-10-CM | POA: Insufficient documentation

## 2017-03-25 DIAGNOSIS — Z5321 Procedure and treatment not carried out due to patient leaving prior to being seen by health care provider: Secondary | ICD-10-CM | POA: Insufficient documentation

## 2017-03-25 NOTE — ED Triage Notes (Signed)
Pt states she has pain and swelling in her legs bilaterally  Pt states she has had it for a while now but is has gotten worse lately  Pt states she takes pills for fluid but it is not helping  Pt has hx of hypertension and states she has been taking it as directed but her blood pressure is high tonight

## 2017-03-26 ENCOUNTER — Emergency Department (HOSPITAL_COMMUNITY): Payer: Self-pay

## 2017-03-26 MED ORDER — AMLODIPINE BESYLATE 5 MG PO TABS: 5.0000 mg | ORAL_TABLET | Freq: Once | ORAL | Status: DC

## 2017-03-26 NOTE — ED Notes (Signed)
Pt not found in room at this time----- apparently left AMA.

## 2017-04-29 ENCOUNTER — Ambulatory Visit: Payer: Self-pay | Attending: Internal Medicine | Admitting: Internal Medicine

## 2017-04-29 ENCOUNTER — Encounter: Payer: Self-pay | Admitting: Internal Medicine

## 2017-04-29 DIAGNOSIS — D259 Leiomyoma of uterus, unspecified: Secondary | ICD-10-CM | POA: Insufficient documentation

## 2017-04-29 DIAGNOSIS — F329 Major depressive disorder, single episode, unspecified: Secondary | ICD-10-CM | POA: Insufficient documentation

## 2017-04-29 DIAGNOSIS — M79605 Pain in left leg: Secondary | ICD-10-CM | POA: Insufficient documentation

## 2017-04-29 DIAGNOSIS — J45909 Unspecified asthma, uncomplicated: Secondary | ICD-10-CM | POA: Insufficient documentation

## 2017-04-29 DIAGNOSIS — I10 Essential (primary) hypertension: Secondary | ICD-10-CM | POA: Insufficient documentation

## 2017-04-29 DIAGNOSIS — R7989 Other specified abnormal findings of blood chemistry: Secondary | ICD-10-CM

## 2017-04-29 DIAGNOSIS — Z6841 Body Mass Index (BMI) 40.0 and over, adult: Secondary | ICD-10-CM | POA: Insufficient documentation

## 2017-04-29 DIAGNOSIS — M170 Bilateral primary osteoarthritis of knee: Secondary | ICD-10-CM | POA: Insufficient documentation

## 2017-04-29 DIAGNOSIS — M79604 Pain in right leg: Secondary | ICD-10-CM | POA: Insufficient documentation

## 2017-04-29 DIAGNOSIS — Z79899 Other long term (current) drug therapy: Secondary | ICD-10-CM | POA: Insufficient documentation

## 2017-04-29 MED ORDER — MELOXICAM 15 MG PO TABS: 15.0000 mg | ORAL_TABLET | Freq: Every day | ORAL | 0 refills | Status: DC

## 2017-04-29 MED ORDER — CARVEDILOL 3.125 MG PO TABS: 3.1250 mg | ORAL_TABLET | Freq: Two times a day (BID) | ORAL | 3 refills | Status: DC

## 2017-04-29 MED FILL — MELOXICAM 15 MG TABLET: 15 | 30 days supply | Qty: 30 | Fill #0

## 2017-04-29 MED FILL — ?CARVEDILOL 3.125 MG TABLET: 3.125 | 30 days supply | Qty: 60 | Fill #0

## 2017-04-29 NOTE — Patient Instructions (Signed)
Start Coreg twice a day for blood pressure.  Start Mobic for knee pain.  Follow a Healthy Eating Plan - You can do it! Limit sugary drinks.  Avoid sodas, sweet tea, sport or energy drinks, or fruit drinks.  Drink water, lo-fat milk, or diet drinks. Limit snack foods.   Cut back on candy, cake, cookies, chips, ice cream.  These are a special treat, only in small amounts. Eat plenty of vegetables.  Especially dark green, red, and orange vegetables. Aim for at least 3 servings a day. More is better! Include fruit in your daily diet.  Whole fruit is much healthier than fruit juice! Limit "white" bread, "white" pasta, "white" rice.   Choose "100% whole grain" products, brown or wild rice. Avoid fatty meats. Try "Meatless Monday" and choose eggs or beans one day a week.  When eating meat, choose lean meats like chicken, [place], and fish.  Grill, broil, or bake meats instead of frying, and eat poultry without the skin. Eat less salt.  Avoid frozen pizzas, frozen dinners and salty foods.  Use seasonings other than salt in cooking.  This can help blood pressure and keep you from swelling Beer, wine and liquor have calories.  If you can safely drink alcohol, limit to 1 drink per day for women, 2 drinks for men

## 2017-04-29 NOTE — Progress Notes (Signed)
Patient ID: Angela [name], female    DOB: 07-19-1975  MRN: [id number]  CC: Leg Swelling   Subjective: Angela [name] is a 41 y.o. female who presents to become establish with me as PCP. She was being followed previously by Domenica Fail, PA Her concerns today include:  Pt with hx of asthma, depression, HTN, obesity, fibroids, abnormal thyroid testt On review of her chart looks like patient most recent complaints of pain that of swelling in the lower extremities.  She was on Norvasc for blood pressure and that was discontinued.  Lasix and spironolactone doses have been titrated up to 80 mg twice daily and spironolactone 100 mg daily.  She was also recently started on Lexapro for depression.  Today:  1. Pain and swelling in legs since Feb 2018. hurts to walk and legs very sensitive to touch of her clothing. "Pains hurt mu legs." More comfortable wearing dresses.  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  Pain is worse with prolonged standing.  She admits to significant pain in both knees left worse than the right. -she feels the diuretics are not helping -consistent in taking medications but has not taken them as yet for the morning -Limits salt in foods No CP/SOB. No PND, orthopnea.  -tired all the time. Not sleeping well. +snoring but not loud.  No cig/ETOH or street drugs. -Patient is morbidly obese and reports that she was not obese until February of this year.  However looking back on her documented weights for 2013, she was obese  Patient Active Problem List   Diagnosis Date Noted  . HTN (hypertension), benign 12/17/2016  . Pain, pelvic, female 02/05/2012  . Fibroids 02/05/2012  . Abnormal uterine bleeding 02/05/2012     Current Outpatient Medications on File Prior to Visit  Medication Sig Dispense Refill  . acetaminophen (TYLENOL) 500 MG tablet Take 1,000 mg by mouth every 6 (six) hours as needed for moderate pain or headache (headach).     Marland Kitchen albuterol  (PROVENTIL HFA;VENTOLIN HFA) 108 (90 Base) MCG/ACT inhaler Inhale 1-2 puffs into the lungs every 6 (six) hours as needed for wheezing or shortness of breath (wheezing). 1 Inhaler prn  . escitalopram (LEXAPRO) 20 MG tablet Take 1 tablet (20 mg total) by mouth daily. 30 tablet 1  . furosemide (LASIX) 80 MG tablet Take 2 tablets (160 mg total) by mouth daily. 60 tablet 2  . spironolactone (ALDACTONE) 50 MG tablet Take 2 tablets (100 mg total) by mouth daily. 60 tablet 2  . [DISCONTINUED] hydrochlorothiazide (HYDRODIURIL) 12.5 MG tablet Take 1 tablet (12.5 mg total) by mouth daily. 30 tablet 1  . [DISCONTINUED] valsartan (DIOVAN) 80 MG tablet Take 1 tablet (80 mg total) by mouth daily. (Patient not taking: Reported on 08/29/2014) 30 tablet 5   No current facility-administered medications on file prior to visit.     Allergies  Allergen Reactions  . Doxycycline Nausea And Vomiting  . Flagyl [Metronidazole Hcl] Hives and Nausea And Vomiting    Social History   Socioeconomic History  . Marital status: Single    Spouse name: Not on file  . Number of children: Not on file  . Years of education: Not on file  . Highest education level: Not on file  Social Needs  . Financial resource strain: Not on file  . Food insecurity - worry: Not on file  . Food insecurity - inability: Not on file  . Transportation needs - medical: Not on file  .  Transportation needs - non-medical: Not on file  Occupational History  . Not on file  Tobacco Use  . Smoking status: Never Smoker  . Smokeless tobacco: Never Used  Substance and Sexual Activity  . Alcohol use: No  . Drug use: No  . Sexual activity: Yes    Birth control/protection: Condom, Surgical  Other Topics Concern  . Not on file  Social History Narrative  . Not on file    Family History  Problem Relation Age of Onset  . Hyperlipidemia Mother   . Cancer Son   . Hypertension Other   . Diabetes Other   . Other Neg Hx     Past Surgical History:   Procedure Laterality Date  . TUBAL LIGATION  1998    ROS: Review of Systems Neg except as above PHYSICAL EXAM: BP (!) 164/103 (BP Location: Left Arm, Patient Position: Sitting, Cuff Size: Normal)   Pulse (!) 104   Temp 98.6 F (37 C) (Oral)   Resp 18   Ht 5\' 1"  (1.549 m)   Wt (!) 333 lb 12.8 oz (151.4 kg)   SpO2 95%   BMI 63.07 kg/m   Wt Readings from Last 3 Encounters:  04/29/17 (!) 333 lb 12.8 oz (151.4 kg)  03/25/17 (!) 322 lb (146.1 kg)  02/18/17 (!) 328 lb (148.8 kg)   Physical Exam General appearance - alert, well appearing, morbidly obese AAF and in no distress Mental status -flat affect Neck - supple, no significant adenopathy Chest - clear to auscultation, no wheezes, rales or rhonchi, symmetric air entry Heart - normal rate, regular rhythm, normal S1, S2, no murmurs, rubs, clicks or gallops. No JVD Abdomen - morbidly obese, NBS, NT. No masses or LN felt in inguinal areas Extremities - legs are morbidly obese with over-lapping skin folds of posterior thigh. Slight woody appearance to medial calf BL. No erythema. No pitting edema. Feet with 3+ DP pulses MSK:  Knees - jts are large due to body habits. No Erythema. Mild to moderate  discomfort with passive ROM   Chemistry      Component Value Date/Time   NA 143 02/11/2017 1651   K 4.3 02/11/2017 1651   CL 105 02/11/2017 1651   CO2 21 02/11/2017 1651   BUN 12 02/11/2017 1651   CREATININE 0.98 02/11/2017 1651   CREATININE 1.12 (H) 08/06/2016 1145      Component Value Date/Time   CALCIUM 9.5 02/11/2017 1651   ALKPHOS 84 12/17/2016 1511   AST 11 12/17/2016 1511   ALT 9 12/17/2016 1511   BILITOT 0.2 12/17/2016 1511     Lab Results  Component Value Date   WBC 6.2 12/17/2016   HGB 12.8 12/17/2016   HCT 39.1 12/17/2016   MCV 84 12/17/2016   PLT 345 12/17/2016    ASSESSMENT AND PLAN: 1. Morbid obesity ([place]) 2. Pain in both lower extremities -when pt is this large, it is sometimes difficult to tell what is  true edema vs just obesity. In this case, I think the discomfort she is having in her legs due more so to morbid obesity and OA of knees. She very well may have some edema that is better with her being on Lasix and Spironolactone even though she does not think it is better. -thyroid levels were borderline several mths ago so will recheck to r/o myxedema. Will also check BNP - TSH+T4F+T3Free - Brain natriuretic peptide  3. Primary osteoarthritis of both knees -Weight loss strongly advised. -Patient to sign up  for the orange card so that we can refer her to a nutritionist on a future visit. -In the meantime we discussed some changes that she can make now like cutting out sodas which she loves, eating smaller portion sizes using a small plate and cutting back on the white carbohydrates -I have challenged her to walk every day for at least 5-10 minutes which she feels she can do  4. Essential hypertension -Patient to follow-up with me in 2 weeks.  I have asked her to take the spironolactone and furosemide before she comes. Add low-dose of carvedilol. Advised to limit salt. - carvedilol (COREG) 3.125 MG tablet; Take 1 tablet (3.125 mg total) by mouth 2 (two) times daily with a meal.  Dispense: 60 tablet; Refill: 3  Patient was given the opportunity to ask questions.  Patient verbalized understanding of the plan and was able to repeat key elements of the plan.   No orders of the defined types were placed in this encounter.    Requested Prescriptions    No prescriptions requested or ordered in this encounter    F/u in 2 wks Karle Plumber, MD, Rosalita Chessman

## 2017-04-30 NOTE — Progress Notes (Signed)
TSH still mildly elevated. Will check a thyroid peroxidase level.

## 2017-04-30 NOTE — Addendum Note (Signed)
Addended by: Karle Plumber B on: 04/30/2017 12:15 PM   Modules accepted: Orders

## 2017-05-01 LAB — TSH+T4F+T3FREE
Free T4: 1.22 ng/dL (ref 0.82–1.77)
T3 FREE: 3.5 pg/mL (ref 2.0–4.4)
TSH: 5.99 u[IU]/mL — AB (ref 0.450–4.500)

## 2017-05-01 LAB — BRAIN NATRIURETIC PEPTIDE: BNP: 13.4 pg/mL (ref 0.0–100.0)

## 2017-05-02 LAB — SPECIMEN STATUS REPORT

## 2017-05-02 LAB — THYROID PEROXIDASE ANTIBODY: Thyroperoxidase Ab SerPl-aCnc: 362 IU/mL — ABNORMAL HIGH (ref 0–34)

## 2017-05-06 ENCOUNTER — Ambulatory Visit: Payer: Self-pay

## 2017-08-22 ENCOUNTER — Other Ambulatory Visit: Payer: Self-pay

## 2017-08-22 ENCOUNTER — Emergency Department (HOSPITAL_COMMUNITY): Payer: Self-pay

## 2017-08-22 ENCOUNTER — Encounter (HOSPITAL_COMMUNITY): Payer: Self-pay

## 2017-08-22 ENCOUNTER — Emergency Department (HOSPITAL_COMMUNITY)
Admission: EM | Admit: 2017-08-22 | Discharge: 2017-08-22 | Disposition: A | Discharge status: Home / Self Care | Payer: Self-pay | Attending: Emergency Medicine | Admitting: Emergency Medicine

## 2017-08-22 DIAGNOSIS — Z79899 Other long term (current) drug therapy: Secondary | ICD-10-CM | POA: Insufficient documentation

## 2017-08-22 DIAGNOSIS — I10 Essential (primary) hypertension: Secondary | ICD-10-CM | POA: Insufficient documentation

## 2017-08-22 DIAGNOSIS — J0140 Acute pansinusitis, unspecified: Secondary | ICD-10-CM | POA: Insufficient documentation

## 2017-08-22 DIAGNOSIS — J4521 Mild intermittent asthma with (acute) exacerbation: Secondary | ICD-10-CM | POA: Insufficient documentation

## 2017-08-22 LAB — CBC WITH DIFFERENTIAL/PLATELET
BASOS PCT: 1 %
Basophils Absolute: 0.1 10*3/uL (ref 0.0–0.1)
EOS PCT: 2 %
Eosinophils Absolute: 0.3 10*3/uL (ref 0.0–0.7)
HEMATOCRIT: 42.4 % (ref 36.0–46.0)
Hemoglobin: 14.2 g/dL (ref 12.0–15.0)
Lymphocytes Relative: 15 %
Lymphs Abs: 1.9 10*3/uL (ref 0.7–4.0)
MCH: 28.6 pg (ref 26.0–34.0)
MCHC: 33.5 g/dL (ref 30.0–36.0)
MCV: 85.3 fL (ref 78.0–100.0)
MONO ABS: 1 10*3/uL (ref 0.1–1.0)
Monocytes Relative: 8 %
NEUTROS ABS: 9.2 10*3/uL — AB (ref 1.7–7.7)
NEUTROS PCT: 74 %
Platelets: 397 10*3/uL (ref 150–400)
RBC: 4.97 MIL/uL (ref 3.87–5.11)
RDW: 13.3 % (ref 11.5–15.5)
WBC: 12.5 10*3/uL — ABNORMAL HIGH (ref 4.0–10.5)

## 2017-08-22 LAB — BASIC METABOLIC PANEL
ANION GAP: 9 (ref 5–15)
BUN: 12 mg/dL (ref 6–20)
CO2: 26 mmol/L (ref 22–32)
Calcium: 9.3 mg/dL (ref 8.9–10.3)
Chloride: 105 mmol/L (ref 101–111)
Creatinine, Ser: 0.92 mg/dL (ref 0.44–1.00)
GFR calc Af Amer: >60 mL/min (ref >60)
GFR calc non Af Amer: >60 mL/min (ref >60)
Glucose, Bld: 99 mg/dL (ref 65–99)
POTASSIUM: 3.8 mmol/L (ref 3.5–5.1)
Sodium: 140 mmol/L (ref 135–145)

## 2017-08-22 MED ORDER — IPRATROPIUM-ALBUTEROL 0.5-2.5 (3) MG/3ML IN SOLN
3.0000 mL | Freq: Once | RESPIRATORY_TRACT | Status: AC
  Administered 2017-08-22: 3 mL via RESPIRATORY_TRACT
  Filled 2017-08-22: qty 3

## 2017-08-22 MED ORDER — AEROCHAMBER PLUS FLO-VU SMALL MISC
1.0000 | Freq: Once | Status: AC
  Administered 2017-08-22: 1
  Filled 2017-08-22: qty 1

## 2017-08-22 MED ORDER — AMOXICILLIN-POT CLAVULANATE 875-125 MG PO TABS: 1.0000 | ORAL_TABLET | Freq: Two times a day (BID) | ORAL | 0 refills | Status: AC

## 2017-08-22 MED ORDER — DEXAMETHASONE SODIUM PHOSPHATE 10 MG/ML IJ SOLN
10.0000 mg | Freq: Once | INTRAMUSCULAR | Status: AC
  Administered 2017-08-22: 10 mg via INTRAMUSCULAR
  Filled 2017-08-22: qty 1

## 2017-08-22 MED ORDER — ALBUTEROL SULFATE HFA 108 (90 BASE) MCG/ACT IN AERS
1.0000 | INHALATION_SPRAY | Freq: Once | RESPIRATORY_TRACT | Status: AC
  Administered 2017-08-22: 1 via RESPIRATORY_TRACT
  Filled 2017-08-22: qty 6.7

## 2017-08-22 NOTE — Discharge Instructions (Addendum)
Please take your blood pressure medication as soon as you get home.  Please follow-up with Dr. Wynetta Emery regarding the pins and needles sensation in your feet.  You may need to have your A1c checked.  Take 2 puffs of the albuterol inhaler with a spacer every 4 hours as needed for shortness of breath.  Been given a shot of Decadron in the emergency department, which is a steroid to help with keeping your lungs open for longer.  Continue to take Coricidin as directed for cough.  Take 650 mg of Tylenol every 6 hours if you develop a fever.  Sinus rinses can help to remove the mucus in your nose to improve the pressure in your forehead and temples.  Instructions are attached.  If the pain and pressure in your forehead and temples does not improve in the next 3-4 days, you can get the prescription for Augmentin filled.  This is an antibiotic that is used to treat sinus infections.  Most sinus infections are viruses, but if they persist more than 7 days they may require antibiotics.  If fevers persist, take Augmentin twice daily for 10 days.  If you develop new or worsening symptoms including shortness of breath that does not improve with using your albuterol inhaler, shortness of breath with pain or discomfort in your chest, if you feel as if your throat is closing or if you are gasping for air, or other concerning symptoms, please return to the emergency department for re-evaluation.

## 2017-08-22 NOTE — ED Provider Notes (Signed)
[facility] DEPT Provider Note   CSN: [id number] Arrival date & time: 08/22/17  1351     History   Chief Complaint Chief Complaint  Patient presents with  . Foot Pain  . Cough  . Shoulder Pain  . Knee Pain    HPI Angela [name] is a 42 y.o. female with a history of obesity, asthma, HTN, and depression who presents to the emergency department with a chief complaint of dyspnea with associated chest tightness, nasal congestion, rhinorrhea, sinus pain and pressure, bilateral neck pain, and paresthesias in the bilateral feet.  The patient endorses intermittent dyspnea with associated chest tightness over the last 3 days with associated rhinorrhea, cough, nasal congestion, sinus pain and pressure over the bilateral frontal sinuses.  Dyspnea is worse with exercise and alleviated with rest.  She also reports that her ears feel clogged bilaterally.  No treatment prior to arrival.  She denies fever, chills, chest pain, numbness, weakness, changes to her vision, nausea, or vomiting.  She reports that the pain in the bilateral sides of her neck began when she woke up several days ago.  Pain is worse when she rotates her head.  He reports that she has been out of her home albuterol inhaler.  She states that she did not have time to eat this morning so she did not take her home AM blood pressure medication.  She also endorses pins and needle sensation to the soles of the bilateral feet this been present over the last month.  She denies a history of diabetes mellitus.  She has been ambulatory without difficulty.  The history is provided by the patient. No language interpreter was used.    Past Medical History:  Diagnosis Date  . Asthma   . Depression    hx, doing ok now  . Headache(784.0)   . Heart murmur   . Hypertension    on meds  . Ovarian cyst   . Preterm labor     Patient Active Problem List   Diagnosis Date Noted  . HTN (hypertension), benign  12/17/2016  . Pain, pelvic, female 02/05/2012  . Fibroids 02/05/2012  . Abnormal uterine bleeding 02/05/2012    Past Surgical History:  Procedure Laterality Date  . TUBAL LIGATION  1998     OB History    Gravida  7   Para  5   Term  3   Preterm  2   AB  2   Living  5     SAB  2   TAB  0   Ectopic  0   Multiple  0   Live Births  5            Home Medications    Prior to Admission medications   Medication Sig Start Date End Date Taking? Authorizing Provider  Chlorphen-PE-Acetaminophen (CORICIDIN D COLD/FLU/SINUS PO) Take 15 mLs by mouth 4 (four) times daily as needed (cold symptoms).   Yes [provider]  Chlorphen-Phenyleph-APAP (CORICIDIN D COLD/FLU/SINUS) 2-5-325 MG TABS Take 1 tablet by mouth daily as needed (cold symptoms).   Yes [provider]  escitalopram (LEXAPRO) 20 MG tablet Take 1 tablet (20 mg total) by mouth daily. 02/11/17  Yes Clent Demark, PA-C  furosemide (LASIX) 80 MG tablet Take 2 tablets (160 mg total) by mouth daily. 02/18/17  Yes Clent Demark, PA-C  meloxicam (MOBIC) 15 MG tablet Take 1 tablet (15 mg total) by mouth daily. 04/29/17  Yes Karle Plumber  B, MD  spironolactone (ALDACTONE) 50 MG tablet Take 2 tablets (100 mg total) by mouth daily. 02/18/17  Yes Clent Demark, PA-C  acetaminophen (TYLENOL) 500 MG tablet Take 1,000 mg by mouth every 6 (six) hours as needed for moderate pain or headache (headach).     [provider]  albuterol (PROVENTIL HFA;VENTOLIN HFA) 108 (90 Base) MCG/ACT inhaler Inhale 1-2 puffs into the lungs every 6 (six) hours as needed for wheezing or shortness of breath (wheezing). 02/16/16   Shelly Bombard, MD  amoxicillin-clavulanate (AUGMENTIN) 875-125 MG tablet Take 1 tablet by mouth every 12 (twelve) hours for 7 days. 08/22/17 08/29/17  Emaley Applin A, PA-C  carvedilol (COREG) 3.125 MG tablet Take 1 tablet (3.125 mg total) by mouth 2 (two) times daily with a meal.  04/29/17   Ladell Pier, MD  hydrochlorothiazide (HYDRODIURIL) 12.5 MG tablet Take 1 tablet (12.5 mg total) by mouth daily. 06/23/14 06/23/14  Comer Locket, PA-C    Family History Family History  Problem Relation Age of Onset  . Hyperlipidemia Mother   . Cancer Son   . Hypertension Other   . Diabetes Other   . Other Neg Hx     Social History Social History   Tobacco Use  . Smoking status: Never Smoker  . Smokeless tobacco: Never Used  Substance Use Topics  . Alcohol use: No  . Drug use: No     Allergies   Doxycycline and Flagyl [metronidazole hcl]   Review of Systems Review of Systems  Constitutional: Negative for activity change, chills and fever.  HENT: Positive for congestion, sinus pressure and sinus pain.        Decreased hearing bilaterally.  Eyes: Negative for visual disturbance.  Respiratory: Positive for cough and shortness of breath.   Cardiovascular: Positive for chest pain.  Gastrointestinal: Negative for abdominal pain, diarrhea and vomiting.  Genitourinary: Negative for dysuria.  Musculoskeletal: Positive for myalgias and neck pain. Negative for arthralgias, back pain, gait problem and neck stiffness.  Skin: Negative for rash.  Allergic/Immunologic: Negative for immunocompromised state.  Neurological: Negative for dizziness, syncope, weakness, light-headedness, numbness and headaches.       Paresthesias  Psychiatric/Behavioral: Negative for confusion.     Physical Exam Updated Vital Signs BP (!) 158/127 (BP Location: Left Wrist)   Pulse (!) 107   Temp 98.4 F (36.9 C) (Oral)   Resp 18   Ht 5\' 1"  (1.549 m)   Wt (!) 151 kg (333 lb)   LMP 08/09/2017   SpO2 99%   BMI 62.92 kg/m   Physical Exam  Constitutional: No distress.  Obese female.  HENT:  Head: Normocephalic.  Right Ear: Ear canal normal. No mastoid tenderness. A middle ear effusion is present.  Left Ear: Ear canal normal. No mastoid tenderness. A middle ear effusion is  present.  Nose: Rhinorrhea present. Right sinus exhibits maxillary sinus tenderness and frontal sinus tenderness. Left sinus exhibits maxillary sinus tenderness and frontal sinus tenderness.  Mouth/Throat: Uvula is midline, oropharynx is clear and moist and mucous membranes are normal. No tonsillar exudate.  Eyes: Conjunctivae are normal.  Neck: Normal range of motion. Neck supple.  No meningismus.  Tender to palpation over the bilateral sternocleidomastoid.  Full active and passive range of motion.  Increased pain with rotation of the neck.  Cardiovascular: Normal rate, regular rhythm, normal heart sounds and intact distal pulses. Exam reveals no gallop and no friction rub.  No murmur heard. Pulmonary/Chest: Effort normal. No stridor. No respiratory  distress. She has no wheezes. She has no rales. She exhibits no tenderness.  Decreased air movement that is symmetric bilaterally.  Lungs are clear to auscultation.  Abdominal: Soft. She exhibits no distension.  Lymphadenopathy:    She has cervical adenopathy.  Neurological: She is alert.  Skin: Skin is warm. Capillary refill takes less than 2 seconds. No rash noted.  Psychiatric: Her behavior is normal.  Nursing note and vitals reviewed.  ED Treatments / Results  Labs (all labs ordered are listed, but only abnormal results are displayed) Labs Reviewed  CBC WITH DIFFERENTIAL/PLATELET - Abnormal; Notable for the following components:      Result Value   WBC 12.5 (*)    Neutro Abs 9.2 (*)    All other components within normal limits  BASIC METABOLIC PANEL    EKG None  Radiology Dg Chest 2 View  Result Date: 08/22/2017 CLINICAL DATA:  History of asthma.  Congestion. EXAM: CHEST - 2 VIEW COMPARISON:  10/23/2015. FINDINGS: Mediastinum and hilar structures normal. Lungs are clear. No pleural effusion or pneumothorax. No acute bony abnormality. IMPRESSION: No acute cardiopulmonary disease. Electronically Signed   By: Marcello Moores  Register   On:  08/22/2017 15:38    Procedures Procedures (including critical care time)  Medications Ordered in ED Medications  albuterol (PROVENTIL HFA;VENTOLIN HFA) 108 (90 Base) MCG/ACT inhaler 1 puff (1 puff Inhalation Provided for home use 08/22/17 1807)  AEROCHAMBER PLUS FLO-VU SMALL device MISC 1 each (1 each Other Provided for home use 08/22/17 1806)  ipratropium-albuterol (DUONEB) 0.5-2.5 (3) MG/3ML nebulizer solution 3 mL (3 mLs Nebulization Given 08/22/17 1807)  dexamethasone (DECADRON) injection 10 mg (10 mg Intramuscular Given 08/22/17 1845)     Initial Impression / Assessment and Plan / ED Course  I have reviewed the triage vital signs and the nursing notes.  Pertinent labs & imaging results that were available during my care of the patient were reviewed by me and considered in my medical decision making (see chart for details).     42 year old female with a history of obesity, asthma, HTN, and depression presenting with dyspnea, chest tightness, rhinorrhea, nasal congestion, sinus pain and pressure, bilateral neck pain, decreased hearing bilaterally, paresthesias in the soles of the bilateral feet.  She is out of her home albuterol inhaler.  Chest x-ray is reassuring.  On physical exam, patient has decreased air movement that is symmetric bilaterally.  After nebulizer treatment, scattered wheezes heard throughout.  Air movement is improved.  She is tender to palpation over the bilateral frontal and maxillary sinuses as well as the bilateral sternocleidomastoid.  No meningismus.  Mild leukocytosis on labs.  Discussed with the patient that most cases of acute sinusitis are viral, but will give her a prescription of Augmentin if her symptoms do not improve in the next 4-5 days.  She is stating that she has to leave to pick up her grandkids and is declining further workup at this time.  She has not taken her home dose of her a.m. blood pressure medication and declines a dose here.  She has been given  an albuterol inhaler and spacer for home use as well as IM Decadron.  I have also recommended symptomatic treatment, including saline rinses to help with sinus pain and pressure.  Recommended follow-up with PCP to have an A1c rechecked given paresthesias in the soles of the bilateral feet that is worsened over the last month.  CBG is 99 today, but the patient reports that she has not eaten  since this morning.  Strict return precautions given.  She is hemodynamically stable and in no acute distress.  Doubt hypertensive urgency, ACS, or pneumonia.  Patient safe for discharge home with outpatient follow-up at this time.  Final Clinical Impressions(s) / ED Diagnoses   Final diagnoses:  Acute pansinusitis, recurrence not specified  Mild intermittent asthma with exacerbation    ED Discharge Orders        Ordered    amoxicillin-clavulanate (AUGMENTIN) 875-125 MG tablet  Every 12 hours     08/22/17 1805       Joanne Gavel, PA-C 08/22/17 1949    [facility], MD 08/23/17 [phone]

## 2017-08-22 NOTE — ED Triage Notes (Signed)
Patient presents with multiple complaints.  Patient reports cold-like symptoms with runny nose, nasal congestion, headache, starting 3 days ago. Patient reports greenish mucous from her nose. Patient reports history of asthma but states "this isnt like an asthma attack, but I can feel the congestion and tightness in my chest.  Patient also reports "pins and needles tingling and burning pain in both heels, starting a month ago." Patient denies history of diabetes. Patient denies taking any medication for her heels.  Patient reports bilateral knee pain pain, starting a year ago. Patient reports taking meloxicam for her pain, but denies relief.  Patient reports left sided shoulder pain, radiating up her neck. Patient reports she feels "like its swollen or something." Patient reports this started approx 1 week ago.

## 2017-08-26 DIAGNOSIS — I10 Essential (primary) hypertension: Secondary | ICD-10-CM

## 2017-09-18 ENCOUNTER — Other Ambulatory Visit: Payer: Self-pay

## 2017-09-18 ENCOUNTER — Encounter (HOSPITAL_COMMUNITY): Payer: Self-pay | Admitting: Emergency Medicine

## 2017-09-18 ENCOUNTER — Ambulatory Visit (HOSPITAL_COMMUNITY)
Admission: EM | Admit: 2017-09-18 | Discharge: 2017-09-18 | Disposition: A | Discharge status: Home / Self Care | Payer: Medicaid Other | Attending: Family Medicine | Admitting: Family Medicine

## 2017-09-18 DIAGNOSIS — J320 Chronic maxillary sinusitis: Secondary | ICD-10-CM | POA: Diagnosis present

## 2017-09-18 MED ORDER — FLUTICASONE PROPIONATE 50 MCG/ACT NA SUSP: 1.0000 | Freq: Every day | NASAL | 2 refills | Status: DC

## 2017-09-18 MED ORDER — AMOXICILLIN 500 MG PO CAPS: 500.0000 mg | ORAL_CAPSULE | Freq: Three times a day (TID) | ORAL | 0 refills | Status: DC

## 2017-09-18 NOTE — ED Triage Notes (Signed)
Uri symptoms that started 4/6.  Recently had the d=same symptoms.  Patient has had back-to -back episodes.  Symptoms include: headache, raspy voice, chest congestion, facial pain

## 2017-09-18 NOTE — ED Provider Notes (Signed)
[facility]    CSN: [id number] Arrival date & time: 09/18/17  1640     History   Chief Complaint Chief Complaint  Patient presents with  . URI    HPI Angela [name] is a 42 y.o. female.   Patient complains of head congestion pressure cough productive of green sputum.  HPI  Past Medical History:  Diagnosis Date  . Asthma   . Depression    hx, doing ok now  . Headache(784.0)   . Heart murmur   . Hypertension    on meds  . Ovarian cyst   . Preterm labor     Patient Active Problem List   Diagnosis Date Noted  . HTN (hypertension), benign 12/17/2016  . Pain, pelvic, female 02/05/2012  . Fibroids 02/05/2012  . Abnormal uterine bleeding 02/05/2012    Past Surgical History:  Procedure Laterality Date  . TUBAL LIGATION  1998    OB History    Gravida  7   Para  5   Term  3   Preterm  2   AB  2   Living  5     SAB  2   TAB  0   Ectopic  0   Multiple  0   Live Births  5            Home Medications    Prior to Admission medications   Medication Sig Start Date End Date Taking? Authorizing Provider  escitalopram (LEXAPRO) 20 MG tablet Take 1 tablet (20 mg total) by mouth daily. 02/11/17  Yes Clent Demark, PA-C  spironolactone (ALDACTONE) 50 MG tablet Take 2 tablets (100 mg total) by mouth daily. 02/18/17  Yes Clent Demark, PA-C  acetaminophen (TYLENOL) 500 MG tablet Take 1,000 mg by mouth every 6 (six) hours as needed for moderate pain or headache (headach).     [provider]  albuterol (PROVENTIL HFA;VENTOLIN HFA) 108 (90 Base) MCG/ACT inhaler Inhale 1-2 puffs into the lungs every 6 (six) hours as needed for wheezing or shortness of breath (wheezing). 02/16/16   Shelly Bombard, MD  carvedilol (COREG) 3.125 MG tablet Take 1 tablet (3.125 mg total) by mouth 2 (two) times daily with a meal. 04/29/17   Ladell Pier, MD  Chlorphen-PE-Acetaminophen (CORICIDIN D COLD/FLU/SINUS PO) Take 15 mLs by mouth 4 (four)  times daily as needed (cold symptoms).    [provider]  Chlorphen-Phenyleph-APAP (CORICIDIN D COLD/FLU/SINUS) 2-5-325 MG TABS Take 1 tablet by mouth daily as needed (cold symptoms).    [provider]  furosemide (LASIX) 80 MG tablet Take 2 tablets (160 mg total) by mouth daily. 02/18/17   Clent Demark, PA-C  meloxicam (MOBIC) 15 MG tablet Take 1 tablet (15 mg total) by mouth daily. 04/29/17   Ladell Pier, MD  hydrochlorothiazide (HYDRODIURIL) 12.5 MG tablet Take 1 tablet (12.5 mg total) by mouth daily. 06/23/14 06/23/14  Comer Locket, PA-C    Family History Family History  Problem Relation Age of Onset  . Hyperlipidemia Mother   . Cancer Son   . Hypertension Other   . Diabetes Other   . Other Neg Hx     Social History Social History   Tobacco Use  . Smoking status: Never Smoker  . Smokeless tobacco: Never Used  Substance Use Topics  . Alcohol use: No  . Drug use: No     Allergies   Doxycycline and Flagyl [metronidazole hcl]   Review of Systems Review  of Systems  HENT: Positive for congestion, sinus pressure and sinus pain.   Respiratory: Positive for cough.   Cardiovascular: Negative.   Gastrointestinal: Negative.      Physical Exam Triage Vital Signs ED Triage Vitals  Enc Vitals Group     BP 09/18/17 1705 (!) 132/92     Pulse Rate 09/18/17 1705 96     Resp 09/18/17 1705 (!) 26     Temp 09/18/17 1705 98 F (36.7 C)     Temp Source 09/18/17 1705 Oral     SpO2 09/18/17 1705 97 %     Weight --      Height --      Head Circumference --      Peak Flow --      Pain Score 09/18/17 1701 10     Pain Loc --      Pain Edu? --      Excl. in [place]? --    No data found.  Updated Vital Signs BP (!) 132/92 (BP Location: Right Arm)   Pulse 96   Temp 98 F (36.7 C) (Oral)   Resp (!) 26   LMP 09/03/2017   SpO2 97%   Visual Acuity Right Eye Distance:   Left Eye Distance:   Bilateral Distance:    Right Eye Near:   Left Eye  Near:    Bilateral Near:     Physical Exam  Constitutional: She is oriented to person, place, and time. She appears well-developed and well-nourished.  HENT:  Head: Normocephalic.  Nose: Nose normal.  Mouth/Throat: Oropharynx is clear and moist.  There is tenderness  Cardiovascular: Normal rate, regular rhythm and normal heart sounds.  Pulmonary/Chest: Effort normal and breath sounds normal.  Neurological: She is alert and oriented to person, place, and time.     UC Treatments / Results  Labs (all labs ordered are listed, but only abnormal results are displayed) Labs Reviewed - No data to display  EKG None Radiology No results found.  Procedures Procedures (including critical care time)  Medications Ordered in UC Medications - No data to display   Initial Impression / Assessment and Plan / UC Course  I have reviewed the triage vital signs and the nursing notes.  Pertinent labs & imaging results that were available during my care of the patient were reviewed by me and considered in my medical decision making (see chart for details).     Sinusitis.  With dark sputum likely bacterial. Treat with amoxicillin and Flonase  Final Clinical Impressions(s) / UC Diagnoses   Final diagnoses:  None    ED Discharge Orders    None       Controlled Substance Prescriptions [place] Controlled Substance Registry consulted? No   Wardell Honour, MD 09/18/17 [phone]

## 2017-10-16 ENCOUNTER — Ambulatory Visit: Payer: Self-pay

## 2017-11-10 ENCOUNTER — Ambulatory Visit (HOSPITAL_COMMUNITY)
Admission: RE | Admit: 2017-11-10 | Discharge: 2017-11-10 | Disposition: A | Discharge status: Home / Self Care | Payer: Medicaid Other | Source: Ambulatory Visit | Attending: Internal Medicine | Admitting: Internal Medicine

## 2017-11-10 ENCOUNTER — Encounter: Payer: Self-pay | Admitting: Internal Medicine

## 2017-11-10 ENCOUNTER — Ambulatory Visit: Payer: Self-pay | Attending: Internal Medicine | Admitting: Internal Medicine

## 2017-11-10 DIAGNOSIS — M170 Bilateral primary osteoarthritis of knee: Secondary | ICD-10-CM

## 2017-11-10 DIAGNOSIS — R7989 Other specified abnormal findings of blood chemistry: Secondary | ICD-10-CM | POA: Insufficient documentation

## 2017-11-10 DIAGNOSIS — H9192 Unspecified hearing loss, left ear: Secondary | ICD-10-CM

## 2017-11-10 DIAGNOSIS — Z883 Allergy status to other anti-infective agents status: Secondary | ICD-10-CM | POA: Insufficient documentation

## 2017-11-10 DIAGNOSIS — Z6841 Body Mass Index (BMI) 40.0 and over, adult: Secondary | ICD-10-CM | POA: Insufficient documentation

## 2017-11-10 DIAGNOSIS — G5603 Carpal tunnel syndrome, bilateral upper limbs: Secondary | ICD-10-CM | POA: Insufficient documentation

## 2017-11-10 DIAGNOSIS — Z881 Allergy status to other antibiotic agents status: Secondary | ICD-10-CM | POA: Insufficient documentation

## 2017-11-10 DIAGNOSIS — Z79899 Other long term (current) drug therapy: Secondary | ICD-10-CM | POA: Insufficient documentation

## 2017-11-10 DIAGNOSIS — I10 Essential (primary) hypertension: Secondary | ICD-10-CM | POA: Insufficient documentation

## 2017-11-10 MED ORDER — MELOXICAM 15 MG PO TABS: 15.0000 mg | ORAL_TABLET | Freq: Every day | ORAL | 5 refills | Status: DC

## 2017-11-10 MED ORDER — CARVEDILOL 3.125 MG PO TABS: 3.1250 mg | ORAL_TABLET | Freq: Two times a day (BID) | ORAL | 6 refills | Status: DC

## 2017-11-10 MED ORDER — HYDROCHLOROTHIAZIDE 25 MG PO TABS: 25.0000 mg | ORAL_TABLET | Freq: Every day | ORAL | 3 refills | Status: DC

## 2017-11-10 MED FILL — MELOXICAM 15 MG TABLET: 15 | 30 days supply | Qty: 30 | Fill #0

## 2017-11-10 MED FILL — HYDROCHLOROTHIAZIDE 25 MG T: 25 | 30 days supply | Qty: 30 | Fill #0

## 2017-11-10 MED FILL — CARVEDILOL 3.125 MG TABLET: 3.125 | 30 days supply | Qty: 60 | Fill #0

## 2017-11-10 NOTE — Patient Instructions (Signed)
Stop the Balance Supplement.  Start Hydrochlorothiazide and Carvedilol for blood pressure.  Follow a Healthy Eating Plan - You can do it! Limit sugary drinks.  Avoid sodas, sweet tea, sport or energy drinks, or fruit drinks.  Drink water, lo-fat milk, or diet drinks. Limit snack foods.   Cut back on candy, cake, cookies, chips, ice cream.  These are a special treat, only in small amounts. Eat plenty of vegetables.  Especially dark green, red, and orange vegetables. Aim for at least 3 servings a day. More is better! Include fruit in your daily diet.  Whole fruit is much healthier than fruit juice! Limit "white" bread, "white" pasta, "white" rice.   Choose "100% whole grain" products, brown or wild rice. Avoid fatty meats. Try "Meatless Monday" and choose eggs or beans one day a week.  When eating meat, choose lean meats like chicken, [place], and fish.  Grill, broil, or bake meats instead of frying, and eat poultry without the skin. Eat less salt.  Avoid frozen pizzas, frozen dinners and salty foods.  Use seasonings other than salt in cooking.  This can help blood pressure and keep you from swelling Beer, wine and liquor have calories.  If you can safely drink alcohol, limit to 1 drink per day for women, 2 drinks for men

## 2017-11-10 NOTE — Progress Notes (Signed)
Patient ID: Angela [name], female    DOB: Nov 08, 1975  MRN: [id number]  CC: Hypertension; Headache; Knee Pain; Blurred Vision; Gastroesophageal Reflux (possible); and Otalgia   Subjective: Angela [name] is a 43 y.o. female who presents for chronic ds management.   Last seen 04/2017 Her concerns today include:  Pt with hx of asthma, depression, HTN, obesity, fibroids, abnormal thyroid test  C/o pain in hands and  knees -pain in the palms and base of thumb.  Some swelling in Lt thumb.  No Stiffness.  +numbness when trying to open up bottles.  Numbness mainly at the tips.  No numbness at nights.  Use to work as a Scientist, research (life sciences); "I still do it here and there but it hurts." Use to have these symptoms off and on since 2014.  Symptoms worse over past 2 wks Uses Tylenol "but it's not working."   Knees: She feels her knees are getting worse.  LT worse than RT.  Has hard time getting up from a couch, getting into a van and extending knees fully.  No injuries or recent falls.  -trying to get wgh down.  Taking a wgh loss supplement call Balance that she purchased online.  Also drinks a shake every morning that she purchased from the same company.  She has been taking daily for past 2 wks.  She is not sure of the ingredients.  Has palpitations lasting for a few mins every time she takes the supplement -she wonders if her thyroid is causing wgh gain. -wants to know if RA is hereditary because mom has it  HTN:  Out of BP meds x 1 mth.  Tries to limit salt  She has a home device but not checking BP Endorses LE edema  C/o dec hearing LT ear x several mths.  She denies working around loud noises.  No ringing in the ears. Had vision and eye screening done in April by the [facility].  She has a copy of the screening test.  Vision is 20/40.  Difficult to make out the numbers for screening test for hearing.  Patient Active Problem List   Diagnosis Date Noted  . 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 HTN (hypertension), benign 12/17/2016  . Pain, pelvic, female 02/05/2012  . Fibroids 02/05/2012  . Abnormal uterine bleeding 02/05/2012     Current Outpatient Medications on File Prior to Visit  Medication Sig Dispense Refill  . acetaminophen (TYLENOL) 500 MG tablet Take 1,000 mg by mouth every 6 (six) hours as needed for moderate pain or headache (headach).     Marland Kitchen albuterol (PROVENTIL HFA;VENTOLIN HFA) 108 (90 Base) MCG/ACT inhaler Inhale 1-2 puffs into the lungs every 6 (six) hours as needed for wheezing or shortness of breath (wheezing). (Patient not taking: Reported on 11/10/2017) 1 Inhaler prn   No current facility-administered medications on file prior to visit.     Allergies  Allergen Reactions  . Doxycycline Nausea And Vomiting  . Flagyl [Metronidazole Hcl] Hives and Nausea And Vomiting    Social History   Socioeconomic History  . Marital status: Single    Spouse name: Not on file  . Number of children: Not on file  . Years of education: Not on file  . Highest education level: Not on file  Occupational History  . Not on file  Social Needs  . Financial resource strain: Not on file  . Food insecurity:    Worry: Not on file    Inability:  Not on file  . Transportation needs:    Medical: Not on file    Non-medical: Not on file  Tobacco Use  . Smoking status: Never Smoker  . Smokeless tobacco: Never Used  Substance and Sexual Activity  . Alcohol use: No  . Drug use: No  . Sexual activity: Yes    Birth control/protection: Condom, Surgical  Lifestyle  . Physical activity:    Days per week: Not on file    Minutes per session: Not on file  . Stress: Not on file  Relationships  . Social connections:    Talks on phone: Not on file    Gets together: Not on file    Attends religious service: Not on file    Active member of club or organization: Not on file    Attends meetings of clubs or organizations: Not on file    Relationship status: Not  on file  . Intimate partner violence:    Fear of current or ex partner: Not on file    Emotionally abused: Not on file    Physically abused: Not on file    Forced sexual activity: Not on file  Other Topics Concern  . Not on file  Social History Narrative  . Not on file    Family History  Problem Relation Age of Onset  . Hyperlipidemia Mother   . Cancer Son   . Hypertension Other   . Diabetes Other   . Other Neg Hx     Past Surgical History:  Procedure Laterality Date  . TUBAL LIGATION  1998    ROS: Review of Systems Negative except as stated above. PHYSICAL EXAM: BP (!) 158/100   Pulse 92   Temp 98.8 F (37.1 C) (Oral)   Resp 16   Ht 5\' 1"  (1.549 m)   Wt (!) 340 lb 3.2 oz (154.3 kg)   SpO2 96%   BMI 64.28 kg/m   Wt Readings from Last 3 Encounters:  11/10/17 (!) 340 lb 3.2 oz (154.3 kg)  08/22/17 (!) 333 lb (151 kg)  04/29/17 (!) 333 lb 12.8 oz (151.4 kg)    Physical Exam General appearance - alert, well appearing, morbidly obese middle-aged African-American female and in no distress Mental status -flat affect Ears - bilateral TM's and external ear canals normal Neck - supple, no significant adenopathy.  No thyroid nodules palpated Chest - clear to auscultation, no wheezes, rales or rhonchi, symmetric air entry Heart -mild tachycardia but regular.   normal S1, S2, no murmurs, rubs, clicks or gallops Musculoskeletal -hands: No signs of active inflammation including swelling.  Mild tenderness on palpation of the CMJ joint left hand. Knees: Large body habitus due to morbid obesity.  No tenderness on palpation.  Mild discomfort with passive range of motion of the left knee. Extremities -no lower extremity edema.  Lab Results  Component Value Date   TSH 5.990 (H) 04/29/2017    ASSESSMENT AND PLAN: 1. Essential hypertension - carvedilol (COREG) 3.125 MG tablet; Take 1 tablet (3.125 mg total) by mouth 2 (two) times daily with a meal.  Dispense: 60 tablet;  Refill: 6 - hydrochlorothiazide (HYDRODIURIL) 25 MG tablet; Take 1 tablet (25 mg total) by mouth daily.  Dispense: 90 tablet; Refill: 3  2. Morbid obesity ([place]) Advised that she stopped the supplement she is taking since it is causing palpitations/tachycardia.  Discussed -Healthy eating tips in detail.  We will start with having her limit all white carbohydrates and stop all sugary drinks.  Walk as much as tolerated -Advised to apply for the orange card/cone discount card.  When she has that we can refer her to a nutritionist  3. Primary osteoarthritis of both knees Stressed the importance of weight loss. - meloxicam (MOBIC) 15 MG tablet; Take 1 tablet (15 mg total) by mouth daily.  Dispense: 30 tablet; Refill: 5 - DG Knee Complete 4 Views Left; Future - DG Knee Complete 4 Views Right; Future  4. Bilateral carpal tunnel syndrome Went over diagnoses.  Recommend trial of wrist splints and meloxicam - meloxicam (MOBIC) 15 MG tablet; Take 1 tablet (15 mg total) by mouth daily.  Dispense: 30 tablet; Refill: 5  5. Decreased hearing of left ear Advised to apply for the orange card at which time we can refer her for full hearing evaluation  6. Abnormal thyroid blood test On last test she was in the range for subclinical hypothyroidism.  Thyroperoxidase antibody was positive suggesting likelihood of progression to full hypothyroidism.  We will recheck levels today.  However patient advised that even if she has hypothyroidism it does not cause morbid obesity - TSH+T4F+T3Free  Patient was given the opportunity to ask questions.  Patient verbalized understanding of the plan and was able to repeat key elements of the plan.   Orders Placed This Encounter  Procedures  . DG Knee Complete 4 Views Left  . DG Knee Complete 4 Views Right  . [id number]     Requested Prescriptions   Signed Prescriptions Disp Refills  . meloxicam (MOBIC) 15 MG tablet 30 tablet 5    Sig: Take 1 tablet (15 mg  total) by mouth daily.  . carvedilol (COREG) 3.125 MG tablet 60 tablet 6    Sig: Take 1 tablet (3.125 mg total) by mouth 2 (two) times daily with a meal.  . hydrochlorothiazide (HYDRODIURIL) 25 MG tablet 90 tablet 3    Sig: Take 1 tablet (25 mg total) by mouth daily.    Return in about 6 weeks (around 12/22/2017).  Karle Plumber, MD, FACP

## 2017-11-10 NOTE — Progress Notes (Signed)
Pt states she is having pain in b/l knees, thumbs and palm of hands  Pt states every time she drinks something it comes back up  Pt states when it comes back up her chesty hurts like if its acid reflux  Pt states the bottom of her feet hurts like there is a bunch of pens and needles sticking her  Pt states she has a headache that comes and goes  Pt states she has been having blurry vision  Pt states she has been having some ear pain

## 2017-11-11 LAB — TSH+T4F+T3FREE
Free T4: 1.24 ng/dL (ref 0.82–1.77)
T3, Free: 3.1 pg/mL (ref 2.0–4.4)
TSH: 3.11 u[IU]/mL (ref 0.450–4.500)

## 2017-11-19 ENCOUNTER — Ambulatory Visit: Payer: Self-pay | Attending: Internal Medicine

## 2017-12-12 LAB — GLUCOSE, POCT (MANUAL RESULT ENTRY): POC GLUCOSE: 94 mg/dL (ref 70–99)

## 2017-12-30 ENCOUNTER — Ambulatory Visit: Payer: Self-pay | Attending: Internal Medicine | Admitting: Internal Medicine

## 2017-12-30 VITALS — BP 167/111 | HR 95 | Temp 99.3°F | Resp 16 | Wt 333.6 lb

## 2017-12-30 DIAGNOSIS — M170 Bilateral primary osteoarthritis of knee: Secondary | ICD-10-CM | POA: Insufficient documentation

## 2017-12-30 DIAGNOSIS — D259 Leiomyoma of uterus, unspecified: Secondary | ICD-10-CM | POA: Insufficient documentation

## 2017-12-30 DIAGNOSIS — J45909 Unspecified asthma, uncomplicated: Secondary | ICD-10-CM | POA: Insufficient documentation

## 2017-12-30 DIAGNOSIS — Z09 Encounter for follow-up examination after completed treatment for conditions other than malignant neoplasm: Secondary | ICD-10-CM | POA: Insufficient documentation

## 2017-12-30 DIAGNOSIS — Z9889 Other specified postprocedural states: Secondary | ICD-10-CM | POA: Insufficient documentation

## 2017-12-30 DIAGNOSIS — Z8349 Family history of other endocrine, nutritional and metabolic diseases: Secondary | ICD-10-CM | POA: Insufficient documentation

## 2017-12-30 DIAGNOSIS — Z833 Family history of diabetes mellitus: Secondary | ICD-10-CM | POA: Insufficient documentation

## 2017-12-30 DIAGNOSIS — F329 Major depressive disorder, single episode, unspecified: Secondary | ICD-10-CM | POA: Insufficient documentation

## 2017-12-30 DIAGNOSIS — Z809 Family history of malignant neoplasm, unspecified: Secondary | ICD-10-CM | POA: Insufficient documentation

## 2017-12-30 DIAGNOSIS — G5603 Carpal tunnel syndrome, bilateral upper limbs: Secondary | ICD-10-CM | POA: Insufficient documentation

## 2017-12-30 DIAGNOSIS — Z79899 Other long term (current) drug therapy: Secondary | ICD-10-CM | POA: Insufficient documentation

## 2017-12-30 DIAGNOSIS — E669 Obesity, unspecified: Secondary | ICD-10-CM | POA: Insufficient documentation

## 2017-12-30 DIAGNOSIS — Z8249 Family history of ischemic heart disease and other diseases of the circulatory system: Secondary | ICD-10-CM | POA: Insufficient documentation

## 2017-12-30 DIAGNOSIS — I10 Essential (primary) hypertension: Secondary | ICD-10-CM | POA: Insufficient documentation

## 2017-12-30 DIAGNOSIS — Z881 Allergy status to other antibiotic agents status: Secondary | ICD-10-CM | POA: Insufficient documentation

## 2017-12-30 MED ORDER — TRAMADOL HCL 50 MG PO TABS
50.0000 mg | ORAL_TABLET | Freq: Three times a day (TID) | ORAL | 0 refills | Status: DC | PRN
Start: 2017-12-30 — End: 2018-10-22

## 2017-12-30 NOTE — Progress Notes (Signed)
Pt is requesting something stronger for arthritis  Pt states she is having pain coming from her b/l feet, hands and knees

## 2017-12-30 NOTE — Progress Notes (Signed)
Patient ID: Angela [name], female    DOB: [birth year]  MRN: [id number]  CC: Follow-up (6 week)   Subjective: Angela [name] is a 42 y.o. female who presents for 6 weeks follow-up Her concerns today include:  Pt with hx of asthma, depression, HTN, obesity, fibroids, abnormal thyroidtest  HTN: Took carvedilol but did not take HCTZ as yet for today. Usually take both in morning Has a device to check but not doing so regularly.    OA:  LT knee worse than RT. Meloxicam does not help. X-rays of the knees show progression of degenerative joint disease on the right side but left knee x-ray revealed no degenerative arthritis changes. She is in process of applying for OC/[facility]. Wanting for information from tax place that she will have notarized.  We will plan to refer her to orthopedics  CTS: was not able to get wrist splints. We did not have her size on last visit. Feels hands are getting worse.   Patient Active Problem List   Diagnosis Date Noted  . 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 HTN (hypertension), benign 12/17/2016  . Pain, pelvic, female 02/05/2012  . Fibroids 02/05/2012  . Abnormal uterine bleeding 02/05/2012     Current Outpatient Medications on File Prior to Visit  Medication Sig Dispense Refill  . acetaminophen (TYLENOL) 500 MG tablet Take 1,000 mg by mouth every 6 (six) hours as needed for moderate pain or headache (headach).     Marland Kitchen albuterol (PROVENTIL HFA;VENTOLIN HFA) 108 (90 Base) MCG/ACT inhaler Inhale 1-2 puffs into the lungs every 6 (six) hours as needed for wheezing or shortness of breath (wheezing). (Patient not taking: Reported on 11/10/2017) 1 Inhaler prn  . carvedilol (COREG) 3.125 MG tablet Take 1 tablet (3.125 mg total) by mouth 2 (two) times daily with a meal. 60 tablet 6  . hydrochlorothiazide (HYDRODIURIL) 25 MG tablet Take 1 tablet (25 mg total) by mouth daily. 90 tablet 3  . meloxicam (MOBIC) 15 MG tablet Take 1 tablet (15 mg total) by mouth daily.  30 tablet 5   No current facility-administered medications on file prior to visit.     Allergies  Allergen Reactions  . Doxycycline Nausea And Vomiting  . Flagyl [Metronidazole Hcl] Hives and Nausea And Vomiting    Social History   Socioeconomic History  . Marital status: Single    Spouse name: Not on file  . Number of children: Not on file  . Years of education: Not on file  . Highest education level: Not on file  Occupational History  . Not on file  Social Needs  . Financial resource strain: Not on file  . Food insecurity:    Worry: Not on file    Inability: Not on file  . Transportation needs:    Medical: Not on file    Non-medical: Not on file  Tobacco Use  . Smoking status: Never Smoker  . Smokeless tobacco: Never Used  Substance and Sexual Activity  . Alcohol use: No  . Drug use: No  . Sexual activity: Yes    Birth control/protection: Condom, Surgical  Lifestyle  . Physical activity:    Days per week: Not on file    Minutes per session: Not on file  . Stress: Not on file  Relationships  . Social connections:    Talks on phone: Not on file    Gets together: Not on file    Attends religious service: Not on file    Active member  of club or organization: Not on file    Attends meetings of clubs or organizations: Not on file    Relationship status: Not on file  . Intimate partner violence:    Fear of current or ex partner: Not on file    Emotionally abused: Not on file    Physically abused: Not on file    Forced sexual activity: Not on file  Other Topics Concern  . Not on file  Social History Narrative  . Not on file    Family History  Problem Relation Age of Onset  . Hyperlipidemia Mother   . Cancer Son   . Hypertension Other   . Diabetes Other   . Other Neg Hx     Past Surgical History:  Procedure Laterality Date  . TUBAL LIGATION  1998    ROS: Review of Systems Negative except as above. PHYSICAL EXAM: BP (!) 167/111 (BP Location:  Right Arm) Comment: recheck Comment (Cuff Size): thigh  Pulse 95   Temp 99.3 F (37.4 C) (Oral)   Resp 16   Wt (!) 333 lb 9.6 oz (151.3 kg)   SpO2 95%   BMI 63.03 kg/m   Repeat BP 110/130 Physical Exam  General appearance - alert, well appearing, and in no distress Mental status - normal mood, behavior, speech, dress, motor activity, and thought processes CVS: Heart regular rate rhythm. Chest: Clear to auscultation bilaterally Extremities - peripheral pulses normal, no pedal edema, no clubbing or cyanosis  ASSESSMENT AND PLAN: 1. Essential hypertension Not at goal.  Patient to take HCTZ and carvedilol every day as prescribed.  2. Primary osteoarthritis of both knees Weight loss encouraged. Continue meloxicam.  Add tramadol to use as needed.  Patient informed that it is a controlled substance.  Informed that she can become dependent or even addicted to the medication.  Informed that the medicine can cause drowsiness.  She denies any past history of substance abuse and is currently not on any street drugs.  No EtOH abuse.  She is agreeable to trying the tramadol.  I went over the controlled substance prescribing agreement with her and had her sign it.  UDS today. When she is approved for the orange card we can refer to orthopedics. - [id number] 11+Oxyco+Alc+Crt-Bund - traMADol (ULTRAM) 50 MG tablet; Take 1 tablet (50 mg total) by mouth every 8 (eight) hours as needed.  Dispense: 90 tablet; Refill: 0  3. Bilateral carpal tunnel syndrome Patient advised to purchase wrist splints from any medical supply store  Patient was given the opportunity to ask questions.  Patient verbalized understanding of the plan and was able to repeat key elements of the plan.   Orders Placed This Encounter  Procedures  . [id number] 11+Oxyco+Alc+Crt-Bund     Requested Prescriptions   Signed Prescriptions Disp Refills  . traMADol (ULTRAM) 50 MG tablet 90 tablet 0    Sig: Take 1 tablet (50 mg total) by mouth  every 8 (eight) hours as needed.    Return in about 3 months (around 04/01/2018).  Karle Plumber, MD, FACP

## 2017-12-31 LAB — DRUG SCREEN 764883 11+OXYCO+ALC+CRT-BUND
Amphetamines, Urine: NEGATIVE ng/mL
BARBITURATE: NEGATIVE ng/mL
BENZODIAZ UR QL: NEGATIVE ng/mL
CANNABINOID QUANT UR: NEGATIVE ng/mL
CREATININE: 359.6 mg/dL — AB (ref 20.0–300.0)
Cocaine (Metabolite): NEGATIVE ng/mL
Ethanol: NEGATIVE %
METHADONE SCREEN, URINE: NEGATIVE ng/mL
Meperidine: NEGATIVE ng/mL
OPIATE SCREEN URINE: NEGATIVE ng/mL
Oxycodone/Oxymorphone, Urine: NEGATIVE ng/mL
Phencyclidine: NEGATIVE ng/mL
Propoxyphene: NEGATIVE ng/mL
Tramadol: NEGATIVE ng/mL
pH, Urine: 6 (ref 4.5–8.9)

## 2018-01-06 ENCOUNTER — Encounter: Payer: Self-pay | Admitting: Internal Medicine

## 2018-01-14 ENCOUNTER — Emergency Department (HOSPITAL_COMMUNITY)
Admission: EM | Admit: 2018-01-14 | Discharge: 2018-01-14 | Disposition: A | Discharge status: Home / Self Care | Payer: Medicaid Other | Attending: Emergency Medicine | Admitting: Emergency Medicine

## 2018-01-14 ENCOUNTER — Encounter (HOSPITAL_COMMUNITY): Payer: Self-pay

## 2018-01-14 DIAGNOSIS — G5603 Carpal tunnel syndrome, bilateral upper limbs: Secondary | ICD-10-CM | POA: Insufficient documentation

## 2018-01-14 DIAGNOSIS — I10 Essential (primary) hypertension: Secondary | ICD-10-CM | POA: Insufficient documentation

## 2018-01-14 DIAGNOSIS — K029 Dental caries, unspecified: Secondary | ICD-10-CM | POA: Insufficient documentation

## 2018-01-14 DIAGNOSIS — Z79899 Other long term (current) drug therapy: Secondary | ICD-10-CM | POA: Insufficient documentation

## 2018-01-14 DIAGNOSIS — H9202 Otalgia, left ear: Secondary | ICD-10-CM

## 2018-01-14 NOTE — ED Provider Notes (Signed)
[place] DEPT Provider Note   CSN: [id number] Arrival date & time: 01/14/18  1850     History   Chief Complaint Chief Complaint  Patient presents with  . Hand Pain  . Otalgia    HPI KWANZA CANCELLIERE is a 42 y.o. female.  42 year old female presents with complaint of left ear pain as well as bilateral hand pain.  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  Patient has been to her PCP for this and diagnosed with carpal tunnel syndrome.  Patient is awaiting orange card approval for follow-up, was given prescription for Ultram for pain however she is unable to afford this medication and also requests a carpal tunnel splint.  Patient denies trauma or injury to her hands, notes pain to her thumbs to third fingers.  Pain radiates up into her forearms.  Left ear pain x1 week, denies facial swelling or dental pain.  Denies pain with touching her ear, drainage from the ear, fevers, swimming, URI symptoms.  No other complaints or concerns.     Past Medical History:  Diagnosis Date  . Asthma   . Depression    hx, doing ok now  . Headache(784.0)   . Heart murmur   . Hypertension    on meds  . Ovarian cyst   . Preterm labor     Patient Active Problem List   Diagnosis Date Noted  . 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 HTN (hypertension), benign 12/17/2016  . Pain, pelvic, female 02/05/2012  . Fibroids 02/05/2012  . Abnormal uterine bleeding 02/05/2012    Past Surgical History:  Procedure Laterality Date  . TUBAL LIGATION  1998     OB History    Gravida  7   Para  5   Term  3   Preterm  2   AB  2   Living  5     SAB  2   TAB  0   Ectopic  0   Multiple  0   Live Births  5            Home Medications    Prior to Admission medications   Medication Sig Start Date End Date Taking?  Authorizing Provider  acetaminophen (TYLENOL) 500 MG tablet Take 1,000 mg by mouth every 6 (six) hours as needed for moderate pain or headache (headach).     [provider]  albuterol (PROVENTIL HFA;VENTOLIN HFA) 108 (90 Base) MCG/ACT inhaler Inhale 1-2 puffs into the lungs every 6 (six) hours as needed for wheezing or shortness of breath (wheezing). Patient not taking: Reported on 11/10/2017 02/16/16   Shelly Bombard, MD  carvedilol (COREG) 3.125 MG tablet Take 1 tablet (3.125 mg total) by mouth 2 (two) times daily with a meal. 11/10/17   Ladell Pier, MD  hydrochlorothiazide (HYDRODIURIL) 25 MG tablet Take 1 tablet (25 mg total) by mouth daily. 11/10/17   Ladell Pier, MD  meloxicam (MOBIC) 15 MG tablet Take 1 tablet (15 mg total) by mouth daily. 11/10/17   Ladell Pier, MD  traMADol (ULTRAM) 50 MG tablet Take 1 tablet (50 mg total) by mouth every 8 (eight) hours as needed. 12/30/17   Ladell Pier, MD    Family History Family History  Problem Relation Age of Onset  . Hyperlipidemia Mother   .  Cancer Son   . Hypertension Other   . Diabetes Other   . Other Neg Hx     Social History Social History   Tobacco Use  . Smoking status: Never Smoker  . Smokeless tobacco: Never Used  Substance Use Topics  . Alcohol use: No  . Drug use: No     Allergies   Doxycycline and Flagyl [metronidazole hcl]   Review of Systems Review of Systems  Constitutional: Negative for fever.  HENT: Positive for ear pain. Negative for congestion, dental problem, ear discharge, facial swelling, hearing loss, postnasal drip, rhinorrhea, sinus pressure, sinus pain, sneezing and sore throat.   Musculoskeletal: Positive for arthralgias and myalgias. Negative for joint swelling.  Skin: Negative for color change, pallor, rash and wound.  Allergic/Immunologic: Negative for immunocompromised state.  Neurological: Positive for weakness and numbness.  Hematological: Negative for  adenopathy. Does not bruise/bleed easily.  Psychiatric/Behavioral: Negative for confusion.  All other systems reviewed and are negative.    Physical Exam Updated Vital Signs BP (!) 166/114 (BP Location: Left Arm) Comment: RN aware  Pulse 96   Temp 99 F (37.2 C) (Oral)   Resp 16   Ht 5\' 1"  (1.549 m)   Wt (!) 151 kg   LMP 12/29/2017   SpO2 99%   BMI 62.92 kg/m   Physical Exam  Constitutional: She is oriented to person, place, and time. She appears well-developed and well-nourished. No distress.  HENT:  Head: Normocephalic and atraumatic.  Right Ear: External ear normal. No drainage, swelling or tenderness. Tympanic membrane is not injected, not scarred, not perforated and not erythematous. No middle ear effusion. No decreased hearing is noted.  Left Ear: External ear normal. No drainage, swelling or tenderness. Tympanic membrane is not injected, not scarred, not perforated and not erythematous.  No middle ear effusion. No decreased hearing is noted.  Nose: Nose normal.  Mouth/Throat: Uvula is midline and oropharynx is clear and moist. No trismus in the jaw. Dental caries present. No dental abscesses or uvula swelling. No oropharyngeal exudate.    Eyes: Conjunctivae are normal.  Cardiovascular: Intact distal pulses.  Pulmonary/Chest: Effort normal.  Musculoskeletal: She exhibits no tenderness or deformity.  Grip strength symmetric, weak. + Phalen, + tinel, no atrophy   Lymphadenopathy:    She has no cervical adenopathy.  Neurological: She is alert and oriented to person, place, and time. No sensory deficit.  Skin: Skin is warm and dry. Capillary refill takes less than 2 seconds. She is not diaphoretic.  Psychiatric: She has a normal mood and affect. Her behavior is normal.  Nursing note and vitals reviewed.    ED Treatments / Results  Labs (all labs ordered are listed, but only abnormal results are displayed) Labs Reviewed - No data to  display  EKG None  Radiology No results found.  Procedures Procedures (including critical care time)  Medications Ordered in ED Medications - No data to display   Initial Impression / Assessment and Plan / ED Course  I have reviewed the triage vital signs and the nursing notes.  Pertinent labs & imaging results that were available during my care of the patient were reviewed by me and considered in my medical decision making (see chart for details).  Clinical Course as of Jan 14 2114  Wed Aug 14, 10745  6116 42 year old female with 2 complaints.  #1 patient has diagnosis of carpal tunnel syndrome bilaterally, has not been able to afford her Ultram.  Patient was given good Rx  coupon with local pharmacy list, feels she may be able to pick a pharmacy that she can afford to go to.  Recommend patient try 1 OTC carpal tunnel splint to see if this gives her any relief while she awaits her orange card process.  Second complaint is left ear pain, on exam there is no tenderness palpation, no canal swelling, left TM is normal, she does have some left upper tooth decay without obvious abscess.  Advised patient pain may be coming from a dental problem and recommend she follow-up with a dentist.  Patient verbalizes understanding of discharge instructions and plan.   [LM]    Clinical Course User Index [LM] Tacy Learn, PA-C    Final Clinical Impressions(s) / ED Diagnoses   Final diagnoses:  Left ear pain  Bilateral carpal tunnel syndrome    ED Discharge Orders    None       Roque Lias 01/14/18 2116    Milton Ferguson, MD 01/15/18 1223

## 2018-01-14 NOTE — ED Triage Notes (Signed)
Pt complains of bilateral hand and wrist pain that radiates up her arms, she was seen on July 30th for the same and was told she may have carpel tunnel, she states that the pain is worse Pt also complains of left ear pain that started today

## 2018-01-14 NOTE — Discharge Instructions (Addendum)
Take Ultram as previously prescribed by your doctor.  A list from good [email or web address] has been provided as well as a discount card. Carpal tunnel braces are available at [facility] or other medical supply stores. Follow discharge instructions for other pain management options for carpal tunnel.

## 2018-01-15 MED FILL — HYDROCHLOROTHIAZIDE 25 MG T: 25 | 30 days supply | Qty: 30 | Fill #1

## 2018-01-15 MED FILL — CARVEDILOL 3.125 MG TABLET: 3.125 | 30 days supply | Qty: 60 | Fill #1

## 2018-01-15 MED FILL — MELOXICAM 15 MG TABLET: 15 | 30 days supply | Qty: 30 | Fill #1

## 2018-01-15 MED FILL — traMADol HCL 50 MG TABS: 50 | 30 days supply | Qty: 90 | Fill #0

## 2018-03-30 ENCOUNTER — Other Ambulatory Visit: Payer: Self-pay

## 2018-03-30 ENCOUNTER — Encounter (HOSPITAL_COMMUNITY): Payer: Self-pay | Admitting: *Deleted

## 2018-03-30 ENCOUNTER — Emergency Department (HOSPITAL_COMMUNITY): Payer: Self-pay

## 2018-03-30 DIAGNOSIS — Z79899 Other long term (current) drug therapy: Secondary | ICD-10-CM | POA: Insufficient documentation

## 2018-03-30 DIAGNOSIS — J45909 Unspecified asthma, uncomplicated: Secondary | ICD-10-CM | POA: Insufficient documentation

## 2018-03-30 DIAGNOSIS — I10 Essential (primary) hypertension: Secondary | ICD-10-CM | POA: Insufficient documentation

## 2018-03-30 DIAGNOSIS — J189 Pneumonia, unspecified organism: Secondary | ICD-10-CM | POA: Insufficient documentation

## 2018-03-30 MED ORDER — ALBUTEROL SULFATE (2.5 MG/3ML) 0.083% IN NEBU
5.0000 mg | INHALATION_SOLUTION | Freq: Once | RESPIRATORY_TRACT | Status: AC
  Administered 2018-03-30: 5 mg via RESPIRATORY_TRACT
  Filled 2018-03-30: qty 6

## 2018-03-30 NOTE — ED Triage Notes (Signed)
Pt reports cough (productive brown mucous) and hoarse voice for about 2 days. Pt has been taking otc meds without relief. Pt denies fevers. Hx of asthma, out of inhalers. Hx of HBP, says she still has one more of her meds to take tonight.

## 2018-03-30 NOTE — ED Notes (Signed)
Patient transported to X-ray 

## 2018-03-31 ENCOUNTER — Emergency Department (HOSPITAL_COMMUNITY)
Admission: EM | Admit: 2018-03-31 | Discharge: 2018-03-31 | Disposition: A | Discharge status: Home / Self Care | Payer: Self-pay | Attending: Emergency Medicine | Admitting: Emergency Medicine

## 2018-03-31 DIAGNOSIS — J189 Pneumonia, unspecified organism: Secondary | ICD-10-CM

## 2018-03-31 MED ORDER — ALBUTEROL SULFATE (5 MG/ML) 0.5% IN NEBU: 2.5000 mg | INHALATION_SOLUTION | Freq: Four times a day (QID) | RESPIRATORY_TRACT | 1 refills | Status: DC | PRN

## 2018-03-31 MED ORDER — ALBUTEROL SULFATE HFA 108 (90 BASE) MCG/ACT IN AERS: 1.0000 | INHALATION_SPRAY | Freq: Four times a day (QID) | RESPIRATORY_TRACT | 0 refills | Status: DC | PRN

## 2018-03-31 MED ORDER — AZITHROMYCIN 250 MG PO TABS: ORAL_TABLET | ORAL | 0 refills | Status: DC

## 2018-03-31 MED FILL — ALBUTEROL 2.5 MG/0.5 ML SOL: (5 MG/ML) | 7 days supply | Qty: 15 | Fill #0

## 2018-03-31 MED FILL — ALBUTEROL SULFATE HFA 108 (: 108 (90 BAS | 25 days supply | Qty: 18 | Fill #0

## 2018-03-31 MED FILL — AZITHROMYCIN 250 MG TABLET: 250 | 5 days supply | Qty: 6 | Fill #0

## 2018-03-31 NOTE — Discharge Instructions (Addendum)
Zithromax as prescribed.  Albuterol nebulizer treatment every 4 hours as needed for wheezing.  Return to the emergency department if symptoms significantly worsen or change.

## 2018-03-31 NOTE — ED Provider Notes (Signed)
[facility] DEPT Provider Note   CSN: [id number] Arrival date & time: 03/30/18  2222     History   Chief Complaint Chief Complaint  Patient presents with  . Cough    HPI Angela [name] is a 42 y.o. female.  Patient is a 42 year old female with past medical history of asthma, hypertension, and obesity.  She presents with a several day history of chest congestion, productive cough, and chills.  The history is provided by the patient.  Cough  This is a new problem. Episode onset: Several days ago. The problem occurs constantly. The problem has been gradually worsening. The cough is productive of brown sputum. There has been no fever. Associated symptoms include chest pain and chills. She has tried nothing for the symptoms. She is not a smoker. Her past medical history is significant for asthma.    Past Medical History:  Diagnosis Date  . Asthma   . Depression    hx, doing ok now  . Headache(784.0)   . Heart murmur   . Hypertension    on meds  . Ovarian cyst   . Preterm labor     Patient Active Problem List   Diagnosis Date Noted  . 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 HTN (hypertension), benign 12/17/2016  . Pain, pelvic, female 02/05/2012  . Fibroids 02/05/2012  . Abnormal uterine bleeding 02/05/2012    Past Surgical History:  Procedure Laterality Date  . TUBAL LIGATION  1998     OB History    Gravida  7   Para  5   Term  3   Preterm  2   AB  2   Living  5     SAB  2   TAB  0   Ectopic  0   Multiple  0   Live Births  5            Home Medications    Prior to Admission medications   Medication Sig Start Date End Date Taking? Authorizing Provider  acetaminophen (TYLENOL) 500 MG tablet Take 1,000 mg by mouth every 6 (six) hours as needed for moderate pain or headache (headach).     [provider]  albuterol (PROVENTIL HFA;VENTOLIN HFA) 108 (90 Base) MCG/ACT inhaler Inhale 1-2 puffs into  the lungs every 6 (six) hours as needed for wheezing or shortness of breath (wheezing). Patient not taking: Reported on 11/10/2017 02/16/16   Shelly Bombard, MD  carvedilol (COREG) 3.125 MG tablet Take 1 tablet (3.125 mg total) by mouth 2 (two) times daily with a meal. 11/10/17   Ladell Pier, MD  hydrochlorothiazide (HYDRODIURIL) 25 MG tablet Take 1 tablet (25 mg total) by mouth daily. 11/10/17   Ladell Pier, MD  meloxicam (MOBIC) 15 MG tablet Take 1 tablet (15 mg total) by mouth daily. 11/10/17   Ladell Pier, MD  traMADol (ULTRAM) 50 MG tablet Take 1 tablet (50 mg total) by mouth every 8 (eight) hours as needed. 12/30/17   Ladell Pier, MD    Family History Family History  Problem Relation Age of Onset  . Hyperlipidemia Mother   . Cancer Son   . Hypertension Other   . Diabetes Other   . Other Neg Hx     Social History Social History   Tobacco Use  . Smoking status: Never Smoker  . Smokeless tobacco: Never Used  Substance Use Topics  . Alcohol use: No  . Drug use: No  Allergies   Doxycycline and Flagyl [metronidazole hcl]   Review of Systems Review of Systems  Constitutional: Positive for chills.  Respiratory: Positive for cough.   Cardiovascular: Positive for chest pain.  All other systems reviewed and are negative.    Physical Exam Updated Vital Signs BP (!) 159/126   Pulse (!) 103   Temp 99.6 F (37.6 C)   Resp 18   LMP 03/25/2018   SpO2 98%   Physical Exam  Constitutional: She is oriented to person, place, and time. She appears well-developed and well-nourished. No distress.  HENT:  Head: Normocephalic and atraumatic.  Neck: Normal range of motion. Neck supple.  Cardiovascular: Normal rate and regular rhythm. Exam reveals no gallop and no friction rub.  No murmur heard. Pulmonary/Chest: Effort normal and breath sounds normal. No respiratory distress. She has no wheezes.  Abdominal: Soft. Bowel sounds are normal. She exhibits  no distension. There is no tenderness.  Musculoskeletal: Normal range of motion.  Neurological: She is alert and oriented to person, place, and time.  Skin: Skin is warm and dry. She is not diaphoretic.  Nursing note and vitals reviewed.    ED Treatments / Results  Labs (all labs ordered are listed, but only abnormal results are displayed) Labs Reviewed - No data to display  EKG None  Radiology Dg Chest 2 View  Result Date: 03/30/2018 CLINICAL DATA:  Productive cough and shortness-of-breath 2 days. EXAM: CHEST - 2 VIEW COMPARISON:  08/22/2017 FINDINGS: Lungs are adequately inflated with mild right perihilar opacification possibly due to atelectasis or pneumonia. No effusion. Cardiomediastinal silhouette and remainder the exam is unchanged. IMPRESSION: Hazy right perihilar opacification which may be due to atelectasis or infection. Electronically Signed   By: Marin Olp M.D.   On: 03/30/2018 23:36    Procedures Procedures (including critical care time)  Medications Ordered in ED Medications  albuterol (PROVENTIL) (2.5 MG/3ML) 0.083% nebulizer solution 5 mg (5 mg Nebulization Given 03/30/18 2244)     Initial Impression / Assessment and Plan / ED Course  I have reviewed the triage vital signs and the nursing notes.  Pertinent labs & imaging results that were available during my care of the patient were reviewed by me and considered in my medical decision making (see chart for details).  Chest x-ray shows what appears to be an infiltrate in the right base.  This will be treated with Zithromax.  She tells me that her nebulizer is in storage and on accessible.  She will be prescribed another nebulizer along with albuterol solution and an albuterol MDI.  She is in no respiratory distress and oxygen saturations are 98%.  She looks clinically well and I believe appropriate for outpatient treatment.  Final Clinical Impressions(s) / ED Diagnoses   Final diagnoses:  None    ED  Discharge Orders    None       Veryl Speak, MD 03/31/18 [phone]

## 2018-04-02 ENCOUNTER — Ambulatory Visit: Payer: Self-pay | Attending: Internal Medicine | Admitting: Internal Medicine

## 2018-04-02 ENCOUNTER — Ambulatory Visit (HOSPITAL_COMMUNITY)
Admission: RE | Admit: 2018-04-02 | Discharge: 2018-04-02 | Disposition: A | Discharge status: Home / Self Care | Payer: Medicaid Other | Source: Ambulatory Visit | Attending: Internal Medicine | Admitting: Internal Medicine

## 2018-04-02 ENCOUNTER — Other Ambulatory Visit: Payer: Self-pay

## 2018-04-02 ENCOUNTER — Emergency Department (HOSPITAL_COMMUNITY): Admission: EM | Admit: 2018-04-02 | Payer: Medicaid Other | Source: Home / Self Care

## 2018-04-02 ENCOUNTER — Encounter: Payer: Self-pay | Admitting: Internal Medicine

## 2018-04-02 VITALS — BP 147/103 | HR 87 | Temp 98.5°F | Resp 16 | Wt 343.0 lb

## 2018-04-02 DIAGNOSIS — F329 Major depressive disorder, single episode, unspecified: Secondary | ICD-10-CM | POA: Insufficient documentation

## 2018-04-02 DIAGNOSIS — I10 Essential (primary) hypertension: Secondary | ICD-10-CM

## 2018-04-02 DIAGNOSIS — Z8249 Family history of ischemic heart disease and other diseases of the circulatory system: Secondary | ICD-10-CM | POA: Insufficient documentation

## 2018-04-02 DIAGNOSIS — M79671 Pain in right foot: Secondary | ICD-10-CM | POA: Insufficient documentation

## 2018-04-02 DIAGNOSIS — M170 Bilateral primary osteoarthritis of knee: Secondary | ICD-10-CM | POA: Insufficient documentation

## 2018-04-02 DIAGNOSIS — Z881 Allergy status to other antibiotic agents status: Secondary | ICD-10-CM | POA: Insufficient documentation

## 2018-04-02 DIAGNOSIS — J4521 Mild intermittent asthma with (acute) exacerbation: Secondary | ICD-10-CM | POA: Insufficient documentation

## 2018-04-02 DIAGNOSIS — Z2821 Immunization not carried out because of patient refusal: Secondary | ICD-10-CM

## 2018-04-02 DIAGNOSIS — J189 Pneumonia, unspecified organism: Secondary | ICD-10-CM | POA: Insufficient documentation

## 2018-04-02 DIAGNOSIS — J181 Lobar pneumonia, unspecified organism: Secondary | ICD-10-CM

## 2018-04-02 DIAGNOSIS — Z79899 Other long term (current) drug therapy: Secondary | ICD-10-CM | POA: Insufficient documentation

## 2018-04-02 DIAGNOSIS — Z6841 Body Mass Index (BMI) 40.0 and over, adult: Secondary | ICD-10-CM | POA: Insufficient documentation

## 2018-04-02 DIAGNOSIS — M7989 Other specified soft tissue disorders: Secondary | ICD-10-CM | POA: Insufficient documentation

## 2018-04-02 MED ORDER — CARVEDILOL 3.125 MG PO TABS: 3.1250 mg | ORAL_TABLET | Freq: Two times a day (BID) | ORAL | 3 refills | Status: DC

## 2018-04-02 MED ORDER — PREDNISONE 10 MG PO TABS: ORAL_TABLET | ORAL | 0 refills | Status: DC

## 2018-04-02 MED FILL — HYDROCHLOROTHIAZIDE 25 MG T: 25 | 30 days supply | Qty: 30 | Fill #2

## 2018-04-02 MED FILL — predniSONE 10 MG TABS: 10 | 6 days supply | Qty: 9 | Fill #0

## 2018-04-02 MED FILL — CARVEDILOL 3.125 MG TABLET: 3.125 | 30 days supply | Qty: 60 | Fill #2

## 2018-04-02 NOTE — Patient Instructions (Addendum)
Please give patient a follow-up appointment with our clinical pharmacist in 3 weeks for repeat blood pressure check.  Your blood pressure is not at goal.  Please check your blood pressure at least twice a week.  Goal is 130/80 or lower.  We have increase the carvedilol to 6.25 mg twice a day.  Please go over to the radiology department at [facility] to have your x-ray of the right foot.

## 2018-04-02 NOTE — Progress Notes (Signed)
Pt states she is still having pain on her left side

## 2018-04-02 NOTE — Progress Notes (Signed)
Patient ID: Angela [name], female    DOB: Jul 11, 1975  MRN: [id number]  CC: Hypertension   Subjective: Angela [name] is a 42 y.o. female who presents for chronic ds management. Her concerns today include:  Pt with hx of asthma, depression, HTN, obesity, fibroids, OA knees, CTS  Patient seen in the emergency room 2 days ago with cough and shortness of breath.  Diagnosed with probable right lower lobe pneumonia.  She is currently on Z-Pak. Using her neb machine 1-2 x a day. SOB and wheezing when she moves around. No fever.  + hoarseness since symptoms started  OA knees:  She has not turned in forms for [facility] discount as yet.  She plans to call on Monday to get appt with our financial counselor.  -takes Tramadol sparingly because it makes her drowsy -c/o pain pain Lateral aspect RT foot  X 1 mth.  No initiating factors.  + swelling.  Not sure of any redness  HTN:  Reports compliance with meds and salt restriction.   -has wrist device but does not check BP    Patient Active Problem List   Diagnosis Date Noted  . 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 HTN (hypertension), benign 12/17/2016  . Pain, pelvic, female 02/05/2012  . Fibroids 02/05/2012  . Abnormal uterine bleeding 02/05/2012     Current Outpatient Medications on File Prior to Visit  Medication Sig Dispense Refill  . acetaminophen (TYLENOL) 500 MG tablet Take 1,000 mg by mouth every 6 (six) hours as needed for moderate pain or headache (headach).     Marland Kitchen albuterol (PROVENTIL HFA;VENTOLIN HFA) 108 (90 Base) MCG/ACT inhaler Inhale 1-2 puffs into the lungs every 6 (six) hours as needed for wheezing or shortness of breath. 1 Inhaler 0  . albuterol (PROVENTIL) (5 MG/ML) 0.5% nebulizer solution Take 0.5 mLs (2.5 mg total) by nebulization every 6 (six) hours as needed for wheezing or shortness of breath. 20 mL 1  . azithromycin (ZITHROMAX Z-PAK) 250 MG tablet 2 po day one, then 1 daily x 4 days 6 tablet 0  . carvedilol  (COREG) 3.125 MG tablet Take 1 tablet (3.125 mg total) by mouth 2 (two) times daily with a meal. 60 tablet 6  . hydrochlorothiazide (HYDRODIURIL) 25 MG tablet Take 1 tablet (25 mg total) by mouth daily. 90 tablet 3  . meloxicam (MOBIC) 15 MG tablet Take 1 tablet (15 mg total) by mouth daily. 30 tablet 5  . traMADol (ULTRAM) 50 MG tablet Take 1 tablet (50 mg total) by mouth every 8 (eight) hours as needed. 90 tablet 0   No current facility-administered medications on file prior to visit.     Allergies  Allergen Reactions  . Doxycycline Nausea And Vomiting  . Flagyl [Metronidazole Hcl] Hives and Nausea And Vomiting    Social History   Socioeconomic History  . Marital status: Single    Spouse name: Not on file  . Number of children: Not on file  . Years of education: Not on file  . Highest education level: Not on file  Occupational History  . Not on file  Social Needs  . Financial resource strain: Not on file  . Food insecurity:    Worry: Not on file    Inability: Not on file  . Transportation needs:    Medical: Not on file    Non-medical: Not on file  Tobacco Use  . Smoking status: Never Smoker  . Smokeless tobacco: Never Used  Substance and Sexual Activity  .  Alcohol use: No  . Drug use: No  . Sexual activity: Yes    Birth control/protection: Condom, Surgical  Lifestyle  . Physical activity:    Days per week: Not on file    Minutes per session: Not on file  . Stress: Not on file  Relationships  . Social connections:    Talks on phone: Not on file    Gets together: Not on file    Attends religious service: Not on file    Active member of club or organization: Not on file    Attends meetings of clubs or organizations: Not on file    Relationship status: Not on file  . Intimate partner violence:    Fear of current or ex partner: Not on file    Emotionally abused: Not on file    Physically abused: Not on file    Forced sexual activity: Not on file  Other Topics  Concern  . Not on file  Social History Narrative  . Not on file    Family History  Problem Relation Age of Onset  . Hyperlipidemia Mother   . Cancer Son   . Hypertension Other   . Diabetes Other   . Other Neg Hx     Past Surgical History:  Procedure Laterality Date  . TUBAL LIGATION  1998    ROS: Review of Systems Neg except as above  PHYSICAL EXAM: BP (!) 147/103 (BP Location: Right Arm, Patient Position: Sitting) Comment (Cuff Size): thigh  Pulse 87   Temp 98.5 F (36.9 C) (Oral)   Resp 16   Wt (!) 343 lb (155.6 kg)   LMP 03/25/2018   SpO2 93%   BMI 64.81 kg/m   Wt Readings from Last 3 Encounters:  04/02/18 (!) 343 lb (155.6 kg)  01/14/18 (!) 333 lb (151 kg)  12/30/17 (!) 333 lb 9.6 oz (151.3 kg)  Repeat BP 160/110  Physical Exam  General appearance - alert, well appearing, morbidly obese African-American female and in no distress.  Patient has mild audible hoarseness Mental status - normal mood, behavior, speech, dress, motor activity, and thought processes Mouth - mucous membranes moist, pharynx normal without lesions Neck - supple, no significant adenopathy Chest - clear to auscultation, no wheezes, rales or rhonchi, symmetric air entry Heart - normal rate, regular rhythm, normal S1, S2, no murmurs, rubs, clicks or gallops Musculoskeletal -RT foot: Mild to moderate soft tissue edema on the dorsal surface of the forefoot.  She has mild soft tissue edema on the left foot.  No erythema noted.  Mild tenderness along the fifth tarsal bone Extremities -large body habitus.  Trace edema in the lower legs    ASSESSMENT AND PLAN:  1. Community acquired pneumonia of right lower lobe of lung ([place]) Patient to complete the Z-Pak.  I have given a short course of prednisone for the asthma  2. Mild intermittent asthma with acute exacerbation - predniSONE (DELTASONE) 10 MG tablet; 2 tabs PO daily x 3 days then 1 tab PO daily x 3 days  Dispense: 9 tablet; Refill: 0  3.  Essential hypertension Not at goal.  I was informed by the pharmacy that patient last filled the carvedilol about 2 months ago.  Apparently she has been taking the carvedilol once a day instead of twice daily.  I had initially plan to increase carvedilol to 6.25 mg twice a day, but upon learning this, advised patient to continue her current dose of 3.125 and take it twice a day. -  Comprehensive metabolic panel - Lipid panel  4. Foot pain, right Questionable etiology.  Differential diagnosis includes gout.  If it is, the prednisone that was given for the asthma should also help. - Uric Acid - DG Foot Complete Right; Future - predniSONE (DELTASONE) 10 MG tablet; 2 tabs PO daily x 3 days then 1 tab PO daily x 3 days  Dispense: 9 tablet; Refill: 0  5. Primary osteoarthritis of both knees She will let me know when she has been approved for the orange card.  Continue Mobic and tramadol.  The latter which she uses sparingly  6. Influenza vaccination declined   Patient was given the opportunity to ask questions.  Patient verbalized understanding of the plan and was able to repeat key elements of the plan.   No orders of the defined types were placed in this encounter.    Requested Prescriptions    No prescriptions requested or ordered in this encounter    No follow-ups on file.  Karle Plumber, MD, FACP

## 2018-04-03 LAB — COMPREHENSIVE METABOLIC PANEL
ALBUMIN: 3.9 g/dL (ref 3.5–5.5)
ALT: 10 IU/L (ref 0–32)
AST: 13 IU/L (ref 0–40)
Albumin/Globulin Ratio: 1.3 (ref 1.2–2.2)
Alkaline Phosphatase: 99 IU/L (ref 39–117)
BUN / CREAT RATIO: 9 (ref 9–23)
BUN: 9 mg/dL (ref 6–24)
CO2: 26 mmol/L (ref 20–29)
Calcium: 9.8 mg/dL (ref 8.7–10.2)
Chloride: 102 mmol/L (ref 96–106)
Creatinine, Ser: 0.99 mg/dL (ref 0.57–1.00)
GFR calc Af Amer: 80 mL/min/{1.73_m2} (ref >59)
GFR calc non Af Amer: 70 mL/min/{1.73_m2} (ref >59)
GLUCOSE: 93 mg/dL (ref 65–99)
Globulin, Total: 3.1 g/dL (ref 1.5–4.5)
Potassium: 4 mmol/L (ref 3.5–5.2)
Sodium: 143 mmol/L (ref 134–144)
TOTAL PROTEIN: 7 g/dL (ref 6.0–8.5)

## 2018-04-03 LAB — LIPID PANEL
CHOL/HDL RATIO: 4.2 ratio (ref 0.0–4.4)
Cholesterol, Total: 173 mg/dL (ref 100–199)
HDL: 41 mg/dL (ref >39)
LDL Calculated: 120 mg/dL — ABNORMAL HIGH (ref 0–99)
Triglycerides: 61 mg/dL (ref 0–149)
VLDL CHOLESTEROL CAL: 12 mg/dL (ref 5–40)

## 2018-04-03 LAB — URIC ACID: Uric Acid: 5.9 mg/dL (ref 2.5–7.1)

## 2018-04-16 ENCOUNTER — Encounter: Payer: Self-pay | Admitting: Internal Medicine

## 2018-04-16 NOTE — Telephone Encounter (Signed)
Patient mychart concern.

## 2018-04-17 ENCOUNTER — Encounter: Payer: Self-pay | Admitting: Internal Medicine

## 2018-04-21 ENCOUNTER — Ambulatory Visit: Payer: Self-pay | Admitting: Internal Medicine

## 2018-04-23 ENCOUNTER — Encounter: Payer: Self-pay | Admitting: Pharmacist

## 2018-07-06 ENCOUNTER — Ambulatory Visit: Payer: Self-pay | Admitting: Internal Medicine

## 2018-07-07 ENCOUNTER — Encounter: Payer: Self-pay | Admitting: Internal Medicine

## 2018-07-07 ENCOUNTER — Ambulatory Visit: Payer: Self-pay | Attending: Internal Medicine | Admitting: Internal Medicine

## 2018-07-07 DIAGNOSIS — R252 Cramp and spasm: Secondary | ICD-10-CM

## 2018-07-07 DIAGNOSIS — G5793 Unspecified mononeuropathy of bilateral lower limbs: Secondary | ICD-10-CM

## 2018-07-07 DIAGNOSIS — M79604 Pain in right leg: Secondary | ICD-10-CM

## 2018-07-07 DIAGNOSIS — I10 Essential (primary) hypertension: Secondary | ICD-10-CM

## 2018-07-07 DIAGNOSIS — M79605 Pain in left leg: Secondary | ICD-10-CM

## 2018-07-07 MED ORDER — GABAPENTIN 300 MG PO CAPS: ORAL_CAPSULE | ORAL | 3 refills | Status: DC

## 2018-07-07 NOTE — Progress Notes (Signed)
Patient ID: Angela [name], female    DOB: 07-Sep-1975  MRN: [id number]  CC: Hypertension   Subjective: Angela [name] is a 43 y.o. female who presents for chronic ds management Her concerns today include:  Pt with hx of asthma, depression, HTN, morbid obesity, fibroids, OA knees, CTS  Pt has several concerns today.  She is concern about wgh gain, pain in legs, numbness in feet  Obesity:  Gained 12 lbs since last visit and not sure why.  Drinks three 16 oz Cheerwine sodas a day and sometimes other sugar drinks. She feels most of her calories are consumed in what she drinks.  Reports that she barely consumes solid foods.  Only eats dinner.  "I just basically drink."   Not getting in much exercise "because my legs hurt too bad.  I walk but not far."  Has feeling of tightness "like rubber ban"  around both ankles.  Gets bad cramp at back of legs in calf  when sitting and laying down and sometimes with ambulation. Also gets cramps in feet and "feel like toes get stuck." Most bothersome when laying down Noticed a burning sensation and pins and needles on soles of feet x several mths She is concern that her legs have gotten so big that she can barely raise them up without pain.  Use to be able to do so in the past.  Mother has similar problems with her legs.  I threw out some possible names and she thinks her mother has elephantiasis  She is concern that she has poor circulation in her legs  HTN:  Did not take meds as yet for today because she has been away from home most of the day taking care of other things.  Reports compliance with meds.  Uses Garlic salt Has monitoring device but does not check BP  OA:  Not taking Meloxicam consistently She has not yet applied for [facility] discount.  States she was waiting on some info from the [employer] which they have sent her but she is not sure if it is what is needed for the [facility] discount application Patient Active Problem List   Diagnosis Date Noted    . Primary osteoarthritis of both knees 04/02/2018  . Foot pain, right 04/02/2018  . 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 HTN (hypertension), benign 12/17/2016  . Pain, pelvic, female 02/05/2012  . Fibroids 02/05/2012  . Abnormal uterine bleeding 02/05/2012     Current Outpatient Medications on File Prior to Visit  Medication Sig Dispense Refill  . acetaminophen (TYLENOL) 500 MG tablet Take 1,000 mg by mouth every 6 (six) hours as needed for moderate pain or headache (headach).     Marland Kitchen albuterol (PROVENTIL HFA;VENTOLIN HFA) 108 (90 Base) MCG/ACT inhaler Inhale 1-2 puffs into the lungs every 6 (six) hours as needed for wheezing or shortness of breath. 1 Inhaler 0  . albuterol (PROVENTIL) (5 MG/ML) 0.5% nebulizer solution Take 0.5 mLs (2.5 mg total) by nebulization every 6 (six) hours as needed for wheezing or shortness of breath. 20 mL 1  . carvedilol (COREG) 3.125 MG tablet Take 1 tablet (3.125 mg total) by mouth 2 (two) times daily with a meal. 60 tablet 3  . hydrochlorothiazide (HYDRODIURIL) 25 MG tablet Take 1 tablet (25 mg total) by mouth daily. 90 tablet 3  . meloxicam (MOBIC) 15 MG tablet Take 1 tablet (15 mg total) by mouth daily. 30 tablet 5  . traMADol (ULTRAM) 50 MG tablet Take 1 tablet (50  mg total) by mouth every 8 (eight) hours as needed. (Patient not taking: Reported on 07/07/2018) 90 tablet 0   No current facility-administered medications on file prior to visit.     Allergies  Allergen Reactions  . Doxycycline Nausea And Vomiting  . Flagyl [Metronidazole Hcl] Hives and Nausea And Vomiting    Social History   Socioeconomic History  . Marital status: Single    Spouse name: Not on file  . Number of children: Not on file  . Years of education: Not on file  . Highest education level: Not on file  Occupational History  . Not on file  Social Needs  . Financial resource strain: Not on file  . Food insecurity:    Worry: Not on file    Inability: Not on file  .  Transportation needs:    Medical: Not on file    Non-medical: Not on file  Tobacco Use  . Smoking status: Never Smoker  . Smokeless tobacco: Never Used  Substance and Sexual Activity  . Alcohol use: No  . Drug use: No  . Sexual activity: Yes    Birth control/protection: Condom, Surgical  Lifestyle  . Physical activity:    Days per week: Not on file    Minutes per session: Not on file  . Stress: Not on file  Relationships  . Social connections:    Talks on phone: Not on file    Gets together: Not on file    Attends religious service: Not on file    Active member of club or organization: Not on file    Attends meetings of clubs or organizations: Not on file    Relationship status: Not on file  . Intimate partner violence:    Fear of current or ex partner: Not on file    Emotionally abused: Not on file    Physically abused: Not on file    Forced sexual activity: Not on file  Other Topics Concern  . Not on file  Social History Narrative  . Not on file    Family History  Problem Relation Age of Onset  . Hyperlipidemia Mother   . Cancer Son   . Hypertension Other   . Diabetes Other   . Other Neg Hx     Past Surgical History:  Procedure Laterality Date  . TUBAL LIGATION  1998    ROS: Review of Systems Neg except as above PHYSICAL EXAM: BP (!) 156/99   Pulse 99   Temp 98.2 F (36.8 C) (Oral)   Resp 16   Wt (!) 354 lb (160.6 kg)   SpO2 97%   BMI 66.89 kg/m   Wt Readings from Last 3 Encounters:  07/07/18 (!) 354 lb (160.6 kg)  04/02/18 (!) 343 lb (155.6 kg)  01/14/18 (!) 333 lb (151 kg)    Physical Exam  General appearance - alert, well appearing, morbidly obese female and in no distress,   She is unable to get up on exam table Chest - clear to auscultation, no wheezes, rales or rhonchi, symmetric air entry Heart - normal rate, regular rhythm, normal S1, S2, no murmurs, rubs, clicks or gallops MSK:  Knees: large body habitus.  Mild to moderate discomfort  with passive ROM Extremities -extreme obesity of legs with trace pitting edema. Skin is firm with over hang especially around knees medially and posteriorly. Some cobblestone of skin over posterior thigh BL. Thickened skin around ankles. Hump edema of dorsal feet. Legs appear symmetric and I do  not feel any underlaying soft tissue mass DP and PT pulses 3+ BL.  Popliteal and femoral pulses difficult to palpate due to obesity.  Pt was unable to hold up abdominal fold sufficiently to allow for good palpation of femorals in seated position Neurologic:  Good strength in LEs.  Decrease sensation on leap exam on soles of feet.  Of note, soles of both feet were heavily soiled with dirt Lab Results  Component Value Date   TSH 3.110 11/10/2017   Lab Results  Component Value Date   HGBA1C 5.5 08/06/2016     ASSESSMENT AND PLAN: 1. Morbid obesity ([place]) Dietary counseling given.  Advised to eliminate sugary drinks from diet pr try cutting back to just one 16 oz soda a day.  Informed pt that currently she is consuming about 840 calories a day just with the sodas alone.  Encourage small portion sizes and dec intake of white carbs. -would benefit from referral to nutritionist but needs [facility] discount -inquired whether she ever though about wgh loss surgery.  Pt states she has not but willing to consider.  Again, however, we are limited by her lack of insurance  2. Essential hypertension Not at goal.  Advised to take meds when she returns home and daily  3. Leg cramps Check lytes and ABI - VAS [place] ABI WITH/WO TBI; Future - Basic Metabolic Panel; Future  4. 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   Stress importance of getting wgh down through change in eating habits and trying to be more active.   Will get vascular US to assess for venous reflux, patency and competency of venous system.  Again, I encourage her to turn in completed application  with supporting documents for [facility] discount - VAS [place] LOWER EXTREMITY VENOUS REFLUX; Future  5. Neuropathy of both feet Check B12 and DM screen.  Trail of Gabapentin as we investigate further. - Vitamin B12; Future - Hemoglobin A1c; Future    Patient was given the opportunity to ask questions.  Patient verbalized understanding of the plan and was able to repeat key elements of the plan.   Orders Placed This Encounter  Procedures  . Vitamin B12  . Hemoglobin A1c  . Basic Metabolic Panel     Requested Prescriptions   Signed Prescriptions Disp Refills  . gabapentin (NEURONTIN) 300 MG capsule 60 capsule 3    Sig: 1 tab PO daily QHS x 1 wk then BID    Return in about 6 weeks (around 08/18/2018).  Karle Plumber, MD, FACP

## 2018-07-07 NOTE — Progress Notes (Signed)
Pt states it feels like there is a rubber band around b/l ankles  Pt states she has a pain in her left thumb that is tender to touch   Pt states she has been having cramps in the back of legs at random times

## 2018-07-07 NOTE — Patient Instructions (Signed)

## 2018-07-08 ENCOUNTER — Encounter: Payer: Self-pay | Admitting: Internal Medicine

## 2018-07-08 DIAGNOSIS — I10 Essential (primary) hypertension: Secondary | ICD-10-CM | POA: Insufficient documentation

## 2018-07-08 DIAGNOSIS — G5793 Unspecified mononeuropathy of bilateral lower limbs: Secondary | ICD-10-CM | POA: Insufficient documentation

## 2018-07-09 NOTE — Addendum Note (Signed)
Addended by: Karle Plumber B on: 07/09/2018 09:12 AM   Modules accepted: Orders

## 2018-07-14 ENCOUNTER — Telehealth: Payer: Self-pay | Admitting: Internal Medicine

## 2018-07-14 NOTE — Telephone Encounter (Signed)
Crystal called to check on the status of the prior authorization for the patient for their [place]. Please follow up.

## 2018-07-20 ENCOUNTER — Encounter (HOSPITAL_COMMUNITY): Payer: Self-pay

## 2018-07-20 NOTE — Telephone Encounter (Signed)
Went on the [employer] to do prior auth for procedures    Put in pt first and last name and dob and per medicaid "This [facility] member does not require prior authorization for OUTPATIENT Radiology through [place] or [place] [facility] at this time".  Returned [employer] and provided her the information

## 2018-07-21 ENCOUNTER — Ambulatory Visit (HOSPITAL_COMMUNITY)
Admission: RE | Admit: 2018-07-21 | Discharge: 2018-07-21 | Disposition: A | Discharge status: Home / Self Care | Payer: Self-pay | Source: Ambulatory Visit | Attending: Family | Admitting: Family

## 2018-07-21 DIAGNOSIS — M79604 Pain in right leg: Secondary | ICD-10-CM | POA: Insufficient documentation

## 2018-07-21 DIAGNOSIS — M79605 Pain in left leg: Secondary | ICD-10-CM | POA: Insufficient documentation

## 2018-07-22 ENCOUNTER — Ambulatory Visit (HOSPITAL_COMMUNITY)
Admission: RE | Admit: 2018-07-22 | Discharge: 2018-07-22 | Disposition: A | Discharge status: Home / Self Care | Payer: Self-pay | Source: Ambulatory Visit | Attending: Vascular Surgery | Admitting: Vascular Surgery

## 2018-07-22 DIAGNOSIS — M79605 Pain in left leg: Secondary | ICD-10-CM | POA: Insufficient documentation

## 2018-07-22 DIAGNOSIS — R252 Cramp and spasm: Secondary | ICD-10-CM | POA: Insufficient documentation

## 2018-07-22 DIAGNOSIS — M79604 Pain in right leg: Secondary | ICD-10-CM | POA: Insufficient documentation

## 2018-07-26 ENCOUNTER — Emergency Department (HOSPITAL_COMMUNITY)
Admission: EM | Admit: 2018-07-26 | Discharge: 2018-07-27 | Disposition: A | Discharge status: Home / Self Care | Payer: Self-pay | Attending: Emergency Medicine | Admitting: Emergency Medicine

## 2018-07-26 ENCOUNTER — Emergency Department (HOSPITAL_COMMUNITY): Payer: Self-pay

## 2018-07-26 ENCOUNTER — Encounter (HOSPITAL_COMMUNITY): Payer: Self-pay

## 2018-07-26 ENCOUNTER — Other Ambulatory Visit: Payer: Self-pay

## 2018-07-26 DIAGNOSIS — R05 Cough: Secondary | ICD-10-CM

## 2018-07-26 DIAGNOSIS — R053 Chronic cough: Secondary | ICD-10-CM

## 2018-07-26 DIAGNOSIS — Z79899 Other long term (current) drug therapy: Secondary | ICD-10-CM | POA: Insufficient documentation

## 2018-07-26 DIAGNOSIS — J45909 Unspecified asthma, uncomplicated: Secondary | ICD-10-CM | POA: Insufficient documentation

## 2018-07-26 DIAGNOSIS — R112 Nausea with vomiting, unspecified: Secondary | ICD-10-CM | POA: Insufficient documentation

## 2018-07-26 DIAGNOSIS — I10 Essential (primary) hypertension: Secondary | ICD-10-CM | POA: Insufficient documentation

## 2018-07-26 MED ORDER — KETOROLAC TROMETHAMINE 30 MG/ML IJ SOLN
30.0000 mg | Freq: Once | INTRAMUSCULAR | Status: DC
  Filled 2018-07-26: qty 1

## 2018-07-26 MED ORDER — SODIUM CHLORIDE 0.9% FLUSH
3.0000 mL | Freq: Once | INTRAVENOUS | Status: AC
  Administered 2018-07-27: 3 mL via INTRAVENOUS

## 2018-07-26 MED ORDER — SODIUM CHLORIDE 0.9 % IV BOLUS
1000.0000 mL | Freq: Once | INTRAVENOUS | Status: AC
  Administered 2018-07-27: 1000 mL via INTRAVENOUS

## 2018-07-26 MED ORDER — ONDANSETRON HCL 4 MG/2ML IJ SOLN
4.0000 mg | Freq: Once | INTRAMUSCULAR | Status: DC
  Filled 2018-07-26: qty 2

## 2018-07-26 NOTE — ED Triage Notes (Signed)
Pt reports L upper abdominal pain and cough. She states that she was dx'd with pneumonia at the end of Oct. She feels like she never fully recovered. She is tachycardic in triage.

## 2018-07-26 NOTE — ED Provider Notes (Signed)
[facility] DEPT Provider Note   CSN: [id number] Arrival date & time: 07/26/18  2249    History   Chief Complaint Chief Complaint  Patient presents with  . Abdominal Pain  . Tachycardia    HPI Angela [name] is a 43 y.o. female.  The history is provided by the patient.  Abdominal Pain  She has history of hypertension, asthma, morbid obesity and comes in with persistent cough.  She was diagnosed with pneumonia 4 months ago and was given antibiotics but states that it never got better.  She continues to cough with productive of yellow sputum.  She denies fever, chills, sweats.  Yesterday, she started having pain in the left inframammary area when she coughs.  Today, she developed vomiting and diarrhea.  She vomited twice and had numerous loose bowel movements.  She did take a dose of loperamide and diarrhea feels like it is improving.  She denies any other pain anywhere.  Past Medical History:  Diagnosis Date  . Asthma   . Depression    hx, doing ok now  . Headache(784.0)   . Heart murmur   . Hypertension    on meds  . Ovarian cyst   . Preterm labor     Patient Active Problem List   Diagnosis Date Noted  . Essential hypertension 07/08/2018  . Morbid obesity ([place]) 07/08/2018  . Neuropathy of both feet 07/08/2018  . Primary osteoarthritis of both knees 04/02/2018  . Foot pain, right 04/02/2018  . 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 HTN (hypertension), benign 12/17/2016  . Pain, pelvic, female 02/05/2012  . Fibroids 02/05/2012  . Abnormal uterine bleeding 02/05/2012    Past Surgical History:  Procedure Laterality Date  . TUBAL LIGATION  1998     OB History    Gravida  7   Para  5   Term  3   Preterm  2   AB  2   Living  5     SAB  2   TAB  0   Ectopic  0   Multiple  0   Live Births  5            Home Medications    Prior to Admission medications   Medication Sig Start Date End Date Taking?  Authorizing Provider  acetaminophen (TYLENOL) 500 MG tablet Take 1,000 mg by mouth every 6 (six) hours as needed for moderate pain or headache (headach).     [provider]  albuterol (PROVENTIL HFA;VENTOLIN HFA) 108 (90 Base) MCG/ACT inhaler Inhale 1-2 puffs into the lungs every 6 (six) hours as needed for wheezing or shortness of breath. 03/31/18   Veryl Speak, MD  albuterol (PROVENTIL) (5 MG/ML) 0.5% nebulizer solution Take 0.5 mLs (2.5 mg total) by nebulization every 6 (six) hours as needed for wheezing or shortness of breath. 03/31/18   Veryl Speak, MD  carvedilol (COREG) 3.125 MG tablet Take 1 tablet (3.125 mg total) by mouth 2 (two) times daily with a meal. 04/02/18   Ladell Pier, MD  gabapentin (NEURONTIN) 300 MG capsule 1 tab PO daily QHS x 1 wk then BID 07/07/18   Ladell Pier, MD  hydrochlorothiazide (HYDRODIURIL) 25 MG tablet Take 1 tablet (25 mg total) by mouth daily. 11/10/17   Ladell Pier, MD  meloxicam (MOBIC) 15 MG tablet Take 1 tablet (15 mg total) by mouth daily. 11/10/17   Ladell Pier, MD  traMADol (ULTRAM) 50 MG tablet Take 1  tablet (50 mg total) by mouth every 8 (eight) hours as needed. Patient not taking: Reported on 07/07/2018 12/30/17   Ladell Pier, MD    Family History Family History  Problem Relation Age of Onset  . Hyperlipidemia Mother   . Cancer Son   . Hypertension Other   . Diabetes Other   . Other Neg Hx     Social History Social History   Tobacco Use  . Smoking status: Never Smoker  . Smokeless tobacco: Never Used  Substance Use Topics  . Alcohol use: No  . Drug use: No     Allergies   Doxycycline and Flagyl [metronidazole hcl]   Review of Systems Review of Systems  Gastrointestinal: Positive for abdominal pain.  All other systems reviewed and are negative.    Physical Exam Updated Vital Signs BP (!) 166/113 (BP Location: Left Arm)   Pulse (!) 128   Temp 100.1 F (37.8 C) (Oral)   Resp 20    Ht 5\' 1"  (1.549 m)   Wt (!) 160.6 kg   SpO2 98%   BMI 66.89 kg/m   Physical Exam Vitals signs and nursing note reviewed.    Morbidly obese 43 year old female, resting comfortably and in no acute distress. Vital signs are significant for elevated heart rate and blood pressure. Oxygen saturation is 98%, which is normal. Head is normocephalic and atraumatic. PERRLA, EOMI. Oropharynx is clear. Neck is nontender and supple without adenopathy or JVD. Back is nontender and there is no CVA tenderness. Lungs are clear without rales, wheezes, or rhonchi. Chest has localized tenderness in the left inframammary area.  There is no crepitus. Heart has regular rate and rhythm without murmur. Abdomen is soft, flat, nontender without masses or hepatosplenomegaly and peristalsis is normoactive. Extremities have 1+ edema, full range of motion is present. Skin is warm and dry without rash. Neurologic: Mental status is normal, cranial nerves are intact, there are no motor or sensory deficits.  ED Treatments / Results  Labs (all labs ordered are listed, but only abnormal results are displayed) Labs Reviewed  COMPREHENSIVE METABOLIC PANEL - Abnormal; Notable for the following components:      Result Value   Glucose, Bld 115 (*)    Creatinine, Ser 1.04 (*)    AST 14 (*)    All other components within normal limits  URINALYSIS, ROUTINE W REFLEX MICROSCOPIC - Abnormal; Notable for the following components:   APPearance CLOUDY (*)    Leukocytes,Ua TRACE (*)    Bacteria, UA RARE (*)    All other components within normal limits  LIPASE, BLOOD  CBC WITH DIFFERENTIAL/PLATELET  I-STAT BETA HCG BLOOD, ED ([facility], [facility], [facility] ONLY)   Radiology Dg Chest 2 View  Result Date: 07/26/2018 CLINICAL DATA:  Cough EXAM: CHEST - 2 VIEW COMPARISON:  03/30/2018 FINDINGS: Heart is borderline in size. Lungs clear. No effusions or edema. No acute bony abnormality. IMPRESSION: Borderline heart size.  No active disease.  Electronically Signed   By: Rolm Baptise M.D.   On: 07/26/2018 23:56    Procedures Procedures  Medications Ordered in ED Medications  ketorolac (TORADOL) 30 MG/ML injection 30 mg (30 mg Intravenous Refused 07/27/18 0017)  ondansetron (ZOFRAN) injection 4 mg (4 mg Intravenous Refused 07/27/18 0017)  sodium chloride flush (NS) 0.9 % injection 3 mL (3 mLs Intravenous Given 07/27/18 0013)  sodium chloride 0.9 % bolus 1,000 mL (1,000 mLs Intravenous New Bag/Given 07/27/18 0013)     Initial Impression / Assessment and  Plan / ED Course  I have reviewed the triage vital signs and the nursing notes.  Pertinent labs & imaging results that were available during my care of the patient were reviewed by me and considered in my medical decision making (see chart for details).  Cough that has persisted after course of antibiotics for community-acquired pneumonia.  Old records reviewed confirming ED visit on March 31, 2018 for community-acquired pneumonia, treated with azithromycin.  Will check chest x-ray.  Tachycardia probably secondary to hypovolemia.  She will be given IV fluids and IV ondansetron.  We will give a dose of ketorolac.  Given persistent cough, will check chest x-ray.  Chest x-ray shows no evidence of pneumonia.  Labs are reassuring.  Patient states that she is not feeling any better following above-noted treatment, but heart rate is down.  Blood pressure is still elevated, but not as severely elevated as on arrival.  Patient states she did not take her blood pressure medication today because of emesis.  She is discharged with prescription for ondansetron, advised to monitor her blood pressure at home, follow-up with PCP in 1 week.  Final Clinical Impressions(s) / ED Diagnoses   Final diagnoses:  Non-intractable vomiting with nausea, unspecified vomiting type  Persistent cough  Elevated blood pressure reading with diagnosis of hypertension    ED Discharge Orders         Ordered     ondansetron (ZOFRAN) 4 MG tablet  Every 6 hours PRN     07/27/18 6568           Delora Fuel, MD 12/75/17 [phone]

## 2018-07-26 NOTE — ED Notes (Signed)
Pt is tachycardic, but requested to not wear the cardiac monitor because she said it made her feel claustrophobic.

## 2018-07-27 LAB — URINALYSIS, ROUTINE W REFLEX MICROSCOPIC
Bilirubin Urine: NEGATIVE
Glucose, UA: NEGATIVE mg/dL
Hgb urine dipstick: NEGATIVE
Ketones, ur: NEGATIVE mg/dL
Nitrite: NEGATIVE
Protein, ur: NEGATIVE mg/dL
Specific Gravity, Urine: 1.021 (ref 1.005–1.030)
pH: 5 (ref 5.0–8.0)

## 2018-07-27 LAB — COMPREHENSIVE METABOLIC PANEL
ALT: 10 U/L (ref 0–44)
AST: 14 U/L — ABNORMAL LOW (ref 15–41)
Albumin: 3.7 g/dL (ref 3.5–5.0)
Alkaline Phosphatase: 79 U/L (ref 38–126)
Anion gap: 7 (ref 5–15)
BILIRUBIN TOTAL: 0.5 mg/dL (ref 0.3–1.2)
BUN: 13 mg/dL (ref 6–20)
CO2: 24 mmol/L (ref 22–32)
Calcium: 8.9 mg/dL (ref 8.9–10.3)
Chloride: 106 mmol/L (ref 98–111)
Creatinine, Ser: 1.04 mg/dL — ABNORMAL HIGH (ref 0.44–1.00)
GFR calc Af Amer: >60 mL/min (ref >60)
GFR calc non Af Amer: >60 mL/min (ref >60)
Glucose, Bld: 115 mg/dL — ABNORMAL HIGH (ref 70–99)
POTASSIUM: 3.5 mmol/L (ref 3.5–5.1)
Sodium: 137 mmol/L (ref 135–145)
Total Protein: 7.6 g/dL (ref 6.5–8.1)

## 2018-07-27 LAB — CBC WITH DIFFERENTIAL/PLATELET
Abs Immature Granulocytes: 0.04 10*3/uL (ref 0.00–0.07)
BASOS ABS: 0 10*3/uL (ref 0.0–0.1)
Basophils Relative: 0 %
Eosinophils Absolute: 0 10*3/uL (ref 0.0–0.5)
Eosinophils Relative: 0 %
HCT: 42.7 % (ref 36.0–46.0)
Hemoglobin: 13.2 g/dL (ref 12.0–15.0)
Immature Granulocytes: 1 %
Lymphocytes Relative: 12 %
Lymphs Abs: 0.9 10*3/uL (ref 0.7–4.0)
MCH: 27 pg (ref 26.0–34.0)
MCHC: 30.9 g/dL (ref 30.0–36.0)
MCV: 87.5 fL (ref 80.0–100.0)
Monocytes Absolute: 0.3 10*3/uL (ref 0.1–1.0)
Monocytes Relative: 4 %
Neutro Abs: 6.2 10*3/uL (ref 1.7–7.7)
Neutrophils Relative %: 83 %
Platelets: 308 10*3/uL (ref 150–400)
RBC: 4.88 MIL/uL (ref 3.87–5.11)
RDW: 13 % (ref 11.5–15.5)
WBC: 7.5 10*3/uL (ref 4.0–10.5)
nRBC: 0 % (ref 0.0–0.2)

## 2018-07-27 LAB — LIPASE, BLOOD: Lipase: 31 U/L (ref 11–51)

## 2018-07-27 LAB — I-STAT BETA HCG BLOOD, ED (MC, WL, AP ONLY): I-stat hCG, quantitative: <5 m[IU]/mL (ref <5)

## 2018-07-27 MED ORDER — ONDANSETRON HCL 4 MG PO TABS: 4.0000 mg | ORAL_TABLET | Freq: Four times a day (QID) | ORAL | 0 refills | Status: DC | PRN

## 2018-07-27 NOTE — ED Notes (Addendum)
Patient ambulated to restroom with minimal assistance to provide urine sample.

## 2018-07-27 NOTE — Discharge Instructions (Signed)
Please make sure to take your blood pressure medication every day.  Please check your blood pressure once a day and keep a record of it.  Take that record with you when you see your primary care provider.

## 2018-07-27 NOTE — ED Notes (Signed)
Patient refused medications, stating "I don't need them"

## 2018-07-29 ENCOUNTER — Encounter (HOSPITAL_COMMUNITY): Payer: Self-pay

## 2018-07-29 ENCOUNTER — Other Ambulatory Visit: Payer: Self-pay

## 2018-07-29 ENCOUNTER — Ambulatory Visit (HOSPITAL_COMMUNITY)
Admission: EM | Admit: 2018-07-29 | Discharge: 2018-07-29 | Disposition: A | Discharge status: Home / Self Care | Payer: Medicaid Other | Attending: Family Medicine | Admitting: Family Medicine

## 2018-07-29 DIAGNOSIS — H66002 Acute suppurative otitis media without spontaneous rupture of ear drum, left ear: Secondary | ICD-10-CM

## 2018-07-29 DIAGNOSIS — I10 Essential (primary) hypertension: Secondary | ICD-10-CM

## 2018-07-29 MED ORDER — AMLODIPINE BESYLATE 5 MG PO TABS: 5.0000 mg | ORAL_TABLET | Freq: Every day | ORAL | 0 refills | Status: DC

## 2018-07-29 MED ORDER — AMOXICILLIN 875 MG PO TABS: 875.0000 mg | ORAL_TABLET | Freq: Two times a day (BID) | ORAL | 0 refills | Status: AC

## 2018-07-29 MED ORDER — AMOXICILLIN 875 MG PO TABS: 875.0000 mg | ORAL_TABLET | Freq: Two times a day (BID) | ORAL | 0 refills | Status: DC

## 2018-07-29 MED FILL — ALBUTEROL 2.5 MG/0.5 ML SOL: 2.5 | 7 days supply | Qty: 15 | Fill #1

## 2018-07-29 MED FILL — HYDROCHLOROTHIAZIDE 25 MG T: 25 | 30 days supply | Qty: 30 | Fill #3

## 2018-07-29 MED FILL — AMLODIPINE BESYLATE 5 MG TA: 5 | 30 days supply | Qty: 30 | Fill #0

## 2018-07-29 MED FILL — AMOXICILLIN 875 MG TABS: 875 | 10 days supply | Qty: 20 | Fill #0

## 2018-07-29 MED FILL — CARVEDILOL 3.125 MG TABLET: 3.125 | 30 days supply | Qty: 60 | Fill #0

## 2018-07-29 NOTE — ED Provider Notes (Signed)
[facility]   [id number] 07/29/18 Arrival Time: 0902  ASSESSMENT & PLAN:  1. Non-recurrent acute suppurative otitis media of left ear without spontaneous rupture of tympanic membrane   2. Uncontrolled hypertension     Meds ordered this encounter  Medications  . amLODipine (NORVASC) 5 MG tablet    Sig: Take 1 tablet (5 mg total) by mouth daily.    Dispense:  30 tablet    Refill:  0  . amoxicillin (AMOXIL) 875 MG tablet    Sig: Take 1 tablet (875 mg total) by mouth 2 (two) times daily for 10 days.    Dispense:  20 tablet    Refill:  0   She prefers to begin additional blood pressure medication.  Discussed typical duration of symptoms. OTC symptom care as needed. Ensure adequate fluid intake and rest. May f/u with PCP or here as needed.  Reviewed expectations re: course of current medical issues. Questions answered. Outlined signs and symptoms indicating need for more acute intervention. Patient verbalized understanding. After Visit Summary given.   SUBJECTIVE: History from: patient.  Angela [name] is a 43 y.o. female who presents with complaint of left otalgia; without drainage; without bleeding. Onset gradual, over the past few days. Recent cold symptoms: minimal. Fever: no. Overall normal PO intake without n/v. Sick contacts: no. OTC treatment: none. Normal hearing.  Also notices high blood pressure. Taking her medications as directed. Does not have f/u with PCP for 1 month. No CP/SOB/LE edema reported. No frequent headaches.  Social History   Tobacco Use  Smoking Status Never Smoker  Smokeless Tobacco Never Used    ROS: As per HPI. All other systems negative.   OBJECTIVE:  Vitals:   07/29/18 0939 07/29/18 0940  BP: (!) 180/117   Pulse: 78   Resp: 18   Temp: 98.4 F (36.9 C)   TempSrc: Oral   SpO2: 99%   Weight:  (!) 160.6 kg    Note elevated BP.  General appearance: alert; appears fatigued Ear Canal: normal TM: left: erythematous,  bulging Neck: supple without LAD CV: RRR Lungs: unlabored respirations, symmetrical air entry; cough: absent; no respiratory distress Abd: obese; soft Skin: warm and dry Psychological: alert and cooperative; normal mood and affect  Allergies  Allergen Reactions  . Doxycycline Nausea And Vomiting  . Flagyl [Metronidazole Hcl] Hives and Nausea And Vomiting    Past Medical History:  Diagnosis Date  . Asthma   . Depression    hx, doing ok now  . Headache(784.0)   . Heart murmur   . Hypertension    on meds  . Ovarian cyst   . Preterm labor    Family History  Problem Relation Age of Onset  . Hyperlipidemia Mother   . Cancer Son   . Hypertension Other   . Diabetes Other   . Other Neg Hx    Social History   Socioeconomic History  . Marital status: Single    Spouse name: Not on file  . Number of children: Not on file  . Years of education: Not on file  . Highest education level: Not on file  Occupational History  . Not on file  Social Needs  . Financial resource strain: Not on file  . Food insecurity:    Worry: Not on file    Inability: Not on file  . Transportation needs:    Medical: Not on file    Non-medical: Not on file  Tobacco Use  . Smoking status: Never  Smoker  . Smokeless tobacco: Never Used  Substance and Sexual Activity  . Alcohol use: No  . Drug use: No  . Sexual activity: Yes    Birth control/protection: Condom, Surgical  Lifestyle  . Physical activity:    Days per week: Not on file    Minutes per session: Not on file  . Stress: Not on file  Relationships  . Social connections:    Talks on phone: Not on file    Gets together: Not on file    Attends religious service: Not on file    Active member of club or organization: Not on file    Attends meetings of clubs or organizations: Not on file    Relationship status: Not on file  . Intimate partner violence:    Fear of current or ex partner: Not on file    Emotionally abused: Not on file     Physically abused: Not on file    Forced sexual activity: Not on file  Other Topics Concern  . Not on file  Social History Narrative  . Not on file            Vanessa Kick, MD 07/29/18 1031

## 2018-07-29 NOTE — ED Triage Notes (Signed)
Pt cc she has left ear pain. Pt state she thinks something crawl in her ear night while she was sleeping.

## 2018-08-20 ENCOUNTER — Other Ambulatory Visit: Payer: Self-pay

## 2018-08-20 ENCOUNTER — Encounter: Payer: Self-pay | Admitting: Internal Medicine

## 2018-08-20 ENCOUNTER — Ambulatory Visit: Payer: Self-pay | Attending: Internal Medicine | Admitting: Internal Medicine

## 2018-08-20 VITALS — BP 136/99 | HR 91 | Temp 98.1°F | Resp 16 | Wt 351.0 lb

## 2018-08-20 DIAGNOSIS — I10 Essential (primary) hypertension: Secondary | ICD-10-CM

## 2018-08-20 DIAGNOSIS — R0981 Nasal congestion: Secondary | ICD-10-CM

## 2018-08-20 DIAGNOSIS — H6982 Other specified disorders of Eustachian tube, left ear: Secondary | ICD-10-CM

## 2018-08-20 DIAGNOSIS — G5793 Unspecified mononeuropathy of bilateral lower limbs: Secondary | ICD-10-CM

## 2018-08-20 MED ORDER — AMLODIPINE BESYLATE 10 MG PO TABS: 10.0000 mg | ORAL_TABLET | Freq: Every day | ORAL | 6 refills | Status: DC

## 2018-08-20 MED ORDER — LORATADINE 10 MG PO TABS: 10.0000 mg | ORAL_TABLET | Freq: Every day | ORAL | 2 refills | Status: DC

## 2018-08-20 MED FILL — GABAPENTIN 300 MG CAPSULE: 300 | 30 days supply | Qty: 60 | Fill #0

## 2018-08-20 NOTE — Patient Instructions (Signed)
Your blood pressure is not at goal.  We have increased amlodipine to 10 mg daily.  Continue to limit salt in the foods.  Continue to use the Flonase as needed.  We have added the Claritin to help decrease the sinus congestion.  You can also use Coricidin HBP over-the-counter as a decongestant.

## 2018-08-20 NOTE — Progress Notes (Signed)
Patient ID: Angela [name], female    DOB: 1975-07-12  MRN: [id number]  CC: Follow-up (6 week)   Subjective: Angela [name] is a 43 y.o. female who presents for chronic ds management Her concerns today include:  Pt with hx of asthma, depression, HTN, morbid obesity, fibroids,OA knees, CTS  Lymphedema: Since last visit with me patient had ABIs of the lower extremities which were normal.  She also had ultrasound of the veins that did not reveal any significant reflux.  On last visit we had ordered an A1c and vitamin B12 level to evaluate neuropathy symptoms in her feet.  She just had labs drawn today.  Since last night she has had a new pain in the right foot.  She describes it as a lightening type feeling on the dorsal surface of the foot.  No known injury.  She never did fill the prescription for the gabapentin that was prescribed on last visit.  She plans to get it today.  Seen in ED 2/23 at [facility] for persistent cough.  Chest x-ray was unrevealing.  Found to be a bit dehydrated and was given some IV fluids.  Seen again on 07/29/2018 with pain in the left ear.  Found to have acute otitis media.  Placed on amoxicillin for 10 days.  Still feels she has ear infection.  When she burps, she has a popping in the ear and feeling like she is talking in a tunnel. Has to turn up volume on her TV.   +Sinus congested and drainage in throat.  Also endorses sneezing.  She is using Flonase once a day  On recent ED visit, blood pressure was found to be uncontrolled.  Norvasc 5 mg daily was added. Checks BP 2 x a day.  She gives range of 191-200/95.  Few times was 159/99 She reports compliance with carvedilol, amlodipine and hydrochlorothiazide.  She limits salt in the foods. Patient Active Problem List   Diagnosis Date Noted  . Essential hypertension 07/08/2018  . Morbid obesity ([place]) 07/08/2018  . Neuropathy of both feet 07/08/2018  . Primary osteoarthritis of both knees 04/02/2018  . Foot pain, right  04/02/2018  . 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 HTN (hypertension), benign 12/17/2016  . Pain, pelvic, female 02/05/2012  . Fibroids 02/05/2012  . Abnormal uterine bleeding 02/05/2012     Current Outpatient Medications on File Prior to Visit  Medication Sig Dispense Refill  . acetaminophen (TYLENOL) 500 MG tablet Take 1,000 mg by mouth every 6 (six) hours as needed for moderate pain or headache (headach).     Marland Kitchen albuterol (PROVENTIL HFA;VENTOLIN HFA) 108 (90 Base) MCG/ACT inhaler Inhale 1-2 puffs into the lungs every 6 (six) hours as needed for wheezing or shortness of breath. 1 Inhaler 0  . albuterol (PROVENTIL) (5 MG/ML) 0.5% nebulizer solution Take 0.5 mLs (2.5 mg total) by nebulization every 6 (six) hours as needed for wheezing or shortness of breath. 20 mL 1  . carvedilol (COREG) 3.125 MG tablet Take 1 tablet (3.125 mg total) by mouth 2 (two) times daily with a meal. 60 tablet 3  . gabapentin (NEURONTIN) 300 MG capsule 1 tab PO daily QHS x 1 wk then BID 60 capsule 3  . hydrochlorothiazide (HYDRODIURIL) 25 MG tablet Take 1 tablet (25 mg total) by mouth daily. 90 tablet 3  . meloxicam (MOBIC) 15 MG tablet Take 1 tablet (15 mg total) by mouth daily. 30 tablet 5  . ondansetron (ZOFRAN) 4 MG tablet Take 1 tablet (  4 mg total) by mouth every 6 (six) hours as needed for nausea. 12 tablet 0  . traMADol (ULTRAM) 50 MG tablet Take 1 tablet (50 mg total) by mouth every 8 (eight) hours as needed. (Patient not taking: Reported on 07/07/2018) 90 tablet 0   No current facility-administered medications on file prior to visit.     Allergies  Allergen Reactions  . Doxycycline Nausea And Vomiting  . Flagyl [Metronidazole Hcl] Hives and Nausea And Vomiting    Social History   Socioeconomic History  . Marital status: Single    Spouse name: Not on file  . Number of children: Not on file  . Years of education: Not on file  . Highest education level: Not on file  Occupational History  . Not on  file  Social Needs  . Financial resource strain: Not on file  . Food insecurity:    Worry: Not on file    Inability: Not on file  . Transportation needs:    Medical: Not on file    Non-medical: Not on file  Tobacco Use  . Smoking status: Never Smoker  . Smokeless tobacco: Never Used  Substance and Sexual Activity  . Alcohol use: No  . Drug use: No  . Sexual activity: Yes    Birth control/protection: Condom, Surgical  Lifestyle  . Physical activity:    Days per week: Not on file    Minutes per session: Not on file  . Stress: Not on file  Relationships  . Social connections:    Talks on phone: Not on file    Gets together: Not on file    Attends religious service: Not on file    Active member of club or organization: Not on file    Attends meetings of clubs or organizations: Not on file    Relationship status: Not on file  . Intimate partner violence:    Fear of current or ex partner: Not on file    Emotionally abused: Not on file    Physically abused: Not on file    Forced sexual activity: Not on file  Other Topics Concern  . Not on file  Social History Narrative  . Not on file    Family History  Problem Relation Age of Onset  . Hyperlipidemia Mother   . Cancer Son   . Hypertension Other   . Diabetes Other   . Other Neg Hx     Past Surgical History:  Procedure Laterality Date  . TUBAL LIGATION  1998    ROS: Review of Systems Negative except as stated above  PHYSICAL EXAM: BP (!) 136/99   Pulse 91   Temp 98.1 F (36.7 C) (Oral)   Resp 16   Wt (!) 351 lb (159.2 kg)   SpO2 95%   BMI 66.32 kg/m   Wt Readings from Last 3 Encounters:  08/20/18 (!) 351 lb (159.2 kg)  07/29/18 (!) 354 lb (160.6 kg)  07/26/18 (!) 354 lb (160.6 kg)   Physical Exam  General appearance - alert, well appearing, and in no distress Mental status - normal mood, behavior, speech, dress, motor activity, and thought processes Nose: Mild enlargement of the nasal turbinates.  Ears: Both ear canal and membranes within normal limits.  Slight wax buildup in the left ear Chest - clear to auscultation, no wheezes, rales or rhonchi, symmetric air entry Heart - normal rate, regular rhythm, normal S1, S2, no murmurs, rubs, clicks or gallops Musculoskeletal -no tenderness on palpation of the  right foot.  CMP Latest Ref Rng & Units 07/26/2018 04/02/2018 08/22/2017  Glucose 70 - 99 mg/dL 115(H) 93 99  BUN 6 - 20 mg/dL 13 9 12   Creatinine 0.44 - 1.00 mg/dL 1.04(H) 0.99 0.92  Sodium 135 - 145 mmol/L 137 143 140  Potassium 3.5 - 5.1 mmol/L 3.5 4.0 3.8  Chloride 98 - 111 mmol/L 106 102 105  CO2 22 - 32 mmol/L 24 26 26   Calcium 8.9 - 10.3 mg/dL 8.9 9.8 9.3  Total Protein 6.5 - 8.1 g/dL 7.6 7.0 -  Total Bilirubin 0.3 - 1.2 mg/dL 0.5 <0.2 -  Alkaline Phos 38 - 126 U/L 79 99 -  AST 15 - 41 U/L 14(L) 13 -  ALT 0 - 44 U/L 10 10 -   Lipid Panel     Component Value Date/Time   CHOL 173 04/02/2018 1644   TRIG 61 04/02/2018 1644   HDL 41 04/02/2018 1644   CHOLHDL 4.2 04/02/2018 1644   LDLCALC 120 (H) 04/02/2018 1644    CBC    Component Value Date/Time   WBC 7.5 07/26/2018 2341   RBC 4.88 07/26/2018 2341   HGB 13.2 07/26/2018 2341   HGB 12.8 12/17/2016 1511   HCT 42.7 07/26/2018 2341   HCT 39.1 12/17/2016 1511   PLT 308 07/26/2018 2341   PLT 345 12/17/2016 1511   MCV 87.5 07/26/2018 2341   MCV 84 12/17/2016 1511   MCH 27.0 07/26/2018 2341   MCHC 30.9 07/26/2018 2341   RDW 13.0 07/26/2018 2341   RDW 13.7 12/17/2016 1511   LYMPHSABS 0.9 07/26/2018 2341   LYMPHSABS 1.7 12/17/2016 1511   MONOABS 0.3 07/26/2018 2341   EOSABS 0.0 07/26/2018 2341   EOSABS 0.3 12/17/2016 1511   BASOSABS 0.0 07/26/2018 2341   BASOSABS 0.0 12/17/2016 1511    ASSESSMENT AND PLAN: 1. Sinus congestion Continue Flonase. - loratadine (CLARITIN) 10 MG tablet; Take 1 tablet (10 mg total) by mouth daily.  Dispense: 30 tablet; Refill: 2  2. Eustachian tube dysfunction, left Recommend  trying Coricidin HBP over-the-counter  3. Neuropathy of both feet Advised patient to start the gabapentin as prescribed on last visit.  We will check A1c and vitamin B12.  If these are normal the next step would be referral to neurology for nerve conduction study - Hemoglobin A1c - Vitamin B12  4. Essential hypertension Not at goal.  Increase amlodipine to 10 mg daily.  Encouraged her to continue to monitor blood pressure with goal of 130/80 or lower - amLODipine (NORVASC) 10 MG tablet; Take 1 tablet (10 mg total) by mouth daily.  Dispense: 30 tablet; Refill: 6    Patient was given the opportunity to ask questions.  Patient verbalized understanding of the plan and was able to repeat key elements of the plan.   No orders of the defined types were placed in this encounter.    Requested Prescriptions   Signed Prescriptions Disp Refills  . loratadine (CLARITIN) 10 MG tablet 30 tablet 2    Sig: Take 1 tablet (10 mg total) by mouth daily.  Marland Kitchen amLODipine (NORVASC) 10 MG tablet 30 tablet 6    Sig: Take 1 tablet (10 mg total) by mouth daily.    Return in about 2 months (around 10/20/2018).  Karle Plumber, MD, FACP

## 2018-08-21 LAB — HEMOGLOBIN A1C
Est. average glucose Bld gHb Est-mCnc: 117 mg/dL
Hgb A1c MFr Bld: 5.7 % — ABNORMAL HIGH (ref 4.8–5.6)

## 2018-08-21 LAB — VITAMIN B12: Vitamin B-12: 693 pg/mL (ref 232–1245)

## 2018-08-21 MED FILL — AMLODIPINE BESYLATE 10 MG T: 10 | 30 days supply | Qty: 30 | Fill #0

## 2018-10-22 ENCOUNTER — Ambulatory Visit: Payer: Self-pay | Attending: Internal Medicine | Admitting: Internal Medicine

## 2018-10-22 ENCOUNTER — Other Ambulatory Visit: Payer: Self-pay

## 2018-10-22 ENCOUNTER — Encounter: Payer: Self-pay | Admitting: Internal Medicine

## 2018-10-22 DIAGNOSIS — J4520 Mild intermittent asthma, uncomplicated: Secondary | ICD-10-CM

## 2018-10-22 DIAGNOSIS — M170 Bilateral primary osteoarthritis of knee: Secondary | ICD-10-CM

## 2018-10-22 DIAGNOSIS — I10 Essential (primary) hypertension: Secondary | ICD-10-CM

## 2018-10-22 DIAGNOSIS — M25551 Pain in right hip: Secondary | ICD-10-CM

## 2018-10-22 MED ORDER — PREDNISONE 20 MG PO TABS: 20.0000 mg | ORAL_TABLET | Freq: Every day | ORAL | 0 refills | Status: DC

## 2018-10-22 MED ORDER — ALBUTEROL SULFATE HFA 108 (90 BASE) MCG/ACT IN AERS: 1.0000 | INHALATION_SPRAY | Freq: Four times a day (QID) | RESPIRATORY_TRACT | 6 refills | Status: DC | PRN

## 2018-10-22 MED ORDER — ALBUTEROL SULFATE (5 MG/ML) 0.5% IN NEBU: 2.5000 mg | INHALATION_SOLUTION | Freq: Four times a day (QID) | RESPIRATORY_TRACT | 6 refills | Status: DC | PRN

## 2018-10-22 MED ORDER — MELOXICAM 15 MG PO TABS: 15.0000 mg | ORAL_TABLET | Freq: Every day | ORAL | 5 refills | Status: DC

## 2018-10-22 MED FILL — ALBUTEROL 2.5 MG/0.5 ML SOL: 2.5 | 5 days supply | Qty: 30 | Fill #0

## 2018-10-22 MED FILL — !VENTOLIN HFA INHALER: 108 (90 BAS | 25 days supply | Qty: 18 | Fill #0

## 2018-10-22 MED FILL — AMLODIPINE BESYLATE 10 MG T: 10 | 30 days supply | Qty: 30 | Fill #1

## 2018-10-22 MED FILL — predniSONE 20 MG TABS: 20 | 4 days supply | Qty: 4 | Fill #0

## 2018-10-22 MED FILL — HYDROCHLOROTHIAZIDE 25 MG T: 25 | 30 days supply | Qty: 30 | Fill #4

## 2018-10-22 MED FILL — CARVEDILOL 3.125 MG TABLET: 3.125 | 30 days supply | Qty: 60 | Fill #1

## 2018-10-22 MED FILL — MELOXICAM 15 MG TABLET: 15 | 30 days supply | Qty: 30 | Fill #0

## 2018-10-22 NOTE — Progress Notes (Signed)
Virtual Visit via Telephone Note Due to current restrictions/limitations of in-office visits due to the COVID-19 pandemic, this scheduled clinical appointment was converted to a telehealth visit  I connected with Angela [name] on 10/22/18 at 3:49 p.m EDT by telephone and verified that I am speaking with the correct person using two identifiers. I am in my office.  The patient is at home.  Only the patient and myself participated in this encounter.  I discussed the limitations, risks, security and privacy concerns of performing an evaluation and management service by telephone and the availability of in person appointments. I also discussed with the patient that there may be a patient responsible charge related to this service. The patient expressed understanding and agreed to proceed.  History of Present Illness: Pt with hx of asthma, depression, HTN,morbidobesity, fibroids,OA knees, CTS. last seen 08/2018.  This was a short-term follow-up chronic disease management   "Sometimes my right hip feels it's about to separate from my leg" x 1 wk. no initiating factors. -hard to get comfortable at nights. -pain most noticeable when she walks or lays on Rt side.  Similar pain in this hip before but not in a while -taking Tylenol and sometimes Aleve, neither helps -out of Meloxicam for a way  HTN:  Reports compliance with meds. Checks BP using a wrist monitor.  She has not checked it recently Limiting salt in foods; swelling in feet is less  Obesity:  Not sure if she loss wgh since last visit.   States she is trying to lose wgh.  "I really don't eat much."  Drinks green tea to detox. "I walk probably every other day."    Asthma:  Request RF on Albuterol and neb treatments.  Uses inhaler only when she walks.  Currently no flareup of asthma.  Outpatient Encounter Medications as of 10/22/2018  Medication Sig  . acetaminophen (TYLENOL) 500 MG tablet Take 1,000 mg by mouth every 6 (six) hours as  needed for moderate pain or headache (headach).   Marland Kitchen albuterol (PROVENTIL HFA;VENTOLIN HFA) 108 (90 Base) MCG/ACT inhaler Inhale 1-2 puffs into the lungs every 6 (six) hours as needed for wheezing or shortness of breath.  Marland Kitchen albuterol (PROVENTIL) (5 MG/ML) 0.5% nebulizer solution Take 0.5 mLs (2.5 mg total) by nebulization every 6 (six) hours as needed for wheezing or shortness of breath.  Marland Kitchen amLODipine (NORVASC) 10 MG tablet Take 1 tablet (10 mg total) by mouth daily.  . carvedilol (COREG) 3.125 MG tablet Take 1 tablet (3.125 mg total) by mouth 2 (two) times daily with a meal.  . gabapentin (NEURONTIN) 300 MG capsule 1 tab PO daily QHS x 1 wk then BID  . hydrochlorothiazide (HYDRODIURIL) 25 MG tablet Take 1 tablet (25 mg total) by mouth daily.  Marland Kitchen loratadine (CLARITIN) 10 MG tablet Take 1 tablet (10 mg total) by mouth daily.  . meloxicam (MOBIC) 15 MG tablet Take 1 tablet (15 mg total) by mouth daily.  . ondansetron (ZOFRAN) 4 MG tablet Take 1 tablet (4 mg total) by mouth every 6 (six) hours as needed for nausea.  . traMADol (ULTRAM) 50 MG tablet Take 1 tablet (50 mg total) by mouth every 8 (eight) hours as needed. (Patient not taking: Reported on 07/07/2018)   No facility-administered encounter medications on file as of 10/22/2018.    Social History   Socioeconomic History  . Marital status: Single    Spouse name: Not on file  . Number of children: Not on file  .  Years of education: Not on file  . Highest education level: Not on file  Occupational History  . Not on file  Social Needs  . Financial resource strain: Not on file  . Food insecurity:    Worry: Not on file    Inability: Not on file  . Transportation needs:    Medical: Not on file    Non-medical: Not on file  Tobacco Use  . Smoking status: Never Smoker  . Smokeless tobacco: Never Used  Substance and Sexual Activity  . Alcohol use: No  . Drug use: No  . Sexual activity: Yes    Birth control/protection: Condom, Surgical   Lifestyle  . Physical activity:    Days per week: Not on file    Minutes per session: Not on file  . Stress: Not on file  Relationships  . Social connections:    Talks on phone: Not on file    Gets together: Not on file    Attends religious service: Not on file    Active member of club or organization: Not on file    Attends meetings of clubs or organizations: Not on file    Relationship status: Not on file  . Intimate partner violence:    Fear of current or ex partner: Not on file    Emotionally abused: Not on file    Physically abused: Not on file    Forced sexual activity: Not on file  Other Topics Concern  . Not on file  Social History Narrative  . Not on file    Observations/Objective: No direct observation done as this was a telephone encounter.  Results for orders placed or performed in visit on 08/20/18  Hemoglobin A1c  Result Value Ref Range   Hgb A1c MFr Bld 5.7 (H) 4.8 - 5.6 %   Est. average glucose Bld gHb Est-mCnc 117 mg/dL  Vitamin B12  Result Value Ref Range   Vitamin B-12 693 232 - 1,245 pg/mL   Assessment and Plan: 1. Essential hypertension Level of control unknown as she has not checked blood pressure recently.  Encouraged her to do so at least once to twice a week with goal being 130/80 or lower.  Continue current medications and low-salt diet.  2. Pain of right hip joint Differential diagnoses include OA versus trochanteric bursitis. We will get her back on meloxicam. - meloxicam (MOBIC) 15 MG tablet; Take 1 tablet (15 mg total) by mouth daily.  Dispense: 30 tablet; Refill: 5 - predniSONE (DELTASONE) 20 MG tablet; Take 1 tablet (20 mg total) by mouth daily with breakfast.  Dispense: 4 tablet; Refill: 0  3. Morbid obesity ([place]) Continues to encourage her to try to move more. Continue to encourage her to try to eat healthy with smaller portion sizes  4. Primary osteoarthritis of both knees - meloxicam (MOBIC) 15 MG tablet; Take 1 tablet (15 mg  total) by mouth daily.  Dispense: 30 tablet; Refill: 5  5. Mild intermittent asthma without complication - albuterol (VENTOLIN HFA) 108 (90 Base) MCG/ACT inhaler; Inhale 1-2 puffs into the lungs every 6 (six) hours as needed for wheezing or shortness of breath.  Dispense: 1 Inhaler; Refill: 6   Follow Up Instructions: 2-3 mths   I discussed the assessment and treatment plan with the patient. The patient was provided an opportunity to ask questions and all were answered. The patient agreed with the plan and demonstrated an understanding of the instructions.   The patient was advised to call back or  seek an in-person evaluation if the symptoms worsen or if the condition fails to improve as anticipated.  I provided 13 minutes of non-face-to-face time during this encounter.   Karle Plumber, MD

## 2018-10-22 NOTE — Progress Notes (Signed)
Pt states she is also having pain in her right knee  Pt is requesting something for pain. Pt states Tramadol doesn't help

## 2018-10-29 ENCOUNTER — Encounter: Payer: Self-pay | Admitting: Internal Medicine

## 2018-11-18 ENCOUNTER — Encounter: Payer: Self-pay | Admitting: Internal Medicine

## 2018-11-19 MED ORDER — DULOXETINE HCL 20 MG PO CPEP: 20.0000 mg | ORAL_CAPSULE | Freq: Every day | ORAL | 3 refills | Status: DC

## 2019-01-22 ENCOUNTER — Other Ambulatory Visit: Payer: Self-pay

## 2019-01-22 ENCOUNTER — Encounter: Payer: Self-pay | Admitting: Internal Medicine

## 2019-01-22 ENCOUNTER — Ambulatory Visit: Payer: Self-pay | Admitting: Internal Medicine

## 2019-01-28 ENCOUNTER — Other Ambulatory Visit: Payer: Self-pay

## 2019-01-28 ENCOUNTER — Ambulatory Visit: Payer: Self-pay | Attending: Internal Medicine | Admitting: Physician Assistant

## 2019-01-28 DIAGNOSIS — I10 Essential (primary) hypertension: Secondary | ICD-10-CM

## 2019-01-28 DIAGNOSIS — M25551 Pain in right hip: Secondary | ICD-10-CM

## 2019-01-28 DIAGNOSIS — M170 Bilateral primary osteoarthritis of knee: Secondary | ICD-10-CM

## 2019-01-28 MED ORDER — CARVEDILOL 3.125 MG PO TABS: 3.1250 mg | ORAL_TABLET | Freq: Two times a day (BID) | ORAL | 3 refills | Status: DC

## 2019-01-28 MED ORDER — AMLODIPINE BESYLATE 10 MG PO TABS: 10.0000 mg | ORAL_TABLET | Freq: Every day | ORAL | 6 refills | Status: DC

## 2019-01-28 MED ORDER — HYDROCHLOROTHIAZIDE 25 MG PO TABS: 25.0000 mg | ORAL_TABLET | Freq: Every day | ORAL | 1 refills | Status: DC

## 2019-01-28 MED ORDER — MELOXICAM 15 MG PO TABS: 15.0000 mg | ORAL_TABLET | Freq: Every day | ORAL | 2 refills | Status: DC

## 2019-01-28 MED FILL — AMLODIPINE BESYLATE 10 MG T: 10 | 30 days supply | Qty: 30 | Fill #0

## 2019-01-28 MED FILL — HYDROCHLOROTHIAZIDE 25 MG T: 25 | 30 days supply | Qty: 30 | Fill #0

## 2019-01-28 MED FILL — MELOXICAM 15 MG TABLET: 15 | 30 days supply | Qty: 30 | Fill #0

## 2019-01-28 MED FILL — CARVEDILOL 3.125 MG TABLET: 3.125 | 30 days supply | Qty: 60 | Fill #0

## 2019-01-28 NOTE — Progress Notes (Signed)
Patient ID: Angela [name], female   DOB: April 14, 1976, 43 y.o.   MRN: [id number] Virtual Visit via Telephone Note  I connected with Angela [name] on 01/28/19 at  2:10 PM EDT by telephone and verified that I am speaking with the correct person using two identifiers.   I discussed the limitations, risks, security and privacy concerns of performing an evaluation and management service by telephone and the availability of in person appointments. I also discussed with the patient that there may be a patient responsible charge related to this service. The patient expressed understanding and agreed to proceed.  Patient location:  home My Location:  [place] office Persons on the call:  Me and the patient   History of Present Illness: R knee (both hurt) but R is worse.  Also R hip hurts.  Pain > 1 year but getting worse.  No fever.  NKI.  Pain worse at rest until gets active.  Able to ambulate w/o assistance  (Xrays 2019 showed degenerative changes)  Needs BP med RF.  No HA/CP/dizziness   Observations/Objective: A&Ox3   Assessment and Plan: 1. Primary osteoarthritis of both knees - Ambulatory referral to Orthopedic Surgery - meloxicam (MOBIC) 15 MG tablet; Take 1 tablet (15 mg total) by mouth daily.  Dispense: 30 tablet; Refill: 2  2. Pain of right hip joint - Ambulatory referral to Orthopedic Surgery - meloxicam (MOBIC) 15 MG tablet; Take 1 tablet (15 mg total) by mouth daily.  Dispense: 30 tablet; Refill: 2  3. Essential hypertension - hydrochlorothiazide (HYDRODIURIL) 25 MG tablet; Take 1 tablet (25 mg total) by mouth daily.  Dispense: 90 tablet; Refill: 1 - carvedilol (COREG) 3.125 MG tablet; Take 1 tablet (3.125 mg total) by mouth 2 (two) times daily with a meal.  Dispense: 60 tablet; Refill: 3 - amLODipine (NORVASC) 10 MG tablet; Take 1 tablet (10 mg total) by mouth daily.  Dispense: 30 tablet; Refill: 6    Follow Up Instructions: 1-2 months with Dr Wynetta Emery   I discussed the  assessment and treatment plan with the patient. The patient was provided an opportunity to ask questions and all were answered. The patient agreed with the plan and demonstrated an understanding of the instructions.   The patient was advised to call back or seek an in-person evaluation if the symptoms worsen or if the condition fails to improve as anticipated.  I provided 11 minutes of non-face-to-face time during this encounter.   Freeman Caldron, PA-C

## 2019-03-29 ENCOUNTER — Ambulatory Visit: Payer: Self-pay | Attending: Internal Medicine | Admitting: Internal Medicine

## 2019-03-29 ENCOUNTER — Other Ambulatory Visit: Payer: Self-pay

## 2019-03-29 ENCOUNTER — Encounter: Payer: Self-pay | Admitting: Internal Medicine

## 2019-03-29 DIAGNOSIS — M25532 Pain in left wrist: Secondary | ICD-10-CM

## 2019-03-29 DIAGNOSIS — G8929 Other chronic pain: Secondary | ICD-10-CM

## 2019-03-29 DIAGNOSIS — M25531 Pain in right wrist: Secondary | ICD-10-CM

## 2019-03-29 DIAGNOSIS — M79642 Pain in left hand: Secondary | ICD-10-CM

## 2019-03-29 DIAGNOSIS — M79641 Pain in right hand: Secondary | ICD-10-CM

## 2019-03-29 DIAGNOSIS — M25551 Pain in right hip: Secondary | ICD-10-CM

## 2019-03-29 DIAGNOSIS — M170 Bilateral primary osteoarthritis of knee: Secondary | ICD-10-CM

## 2019-03-29 DIAGNOSIS — I10 Essential (primary) hypertension: Secondary | ICD-10-CM

## 2019-03-29 MED ORDER — MELOXICAM 15 MG PO TABS: 15.0000 mg | ORAL_TABLET | Freq: Every day | ORAL | 6 refills | Status: DC

## 2019-03-29 NOTE — Progress Notes (Signed)
Pt states she is having pain in b/l hands, right knee and right hip

## 2019-03-29 NOTE — Progress Notes (Signed)
Virtual Visit via Telephone Note  I connected with Angela [name] on 03/29/19 at 5:10 p.m by telephone and verified that I am speaking with the correct person using two identifiers.   I discussed the limitations, risks, security and privacy concerns of performing an evaluation and management service by telephone and the availability of in person appointments. I also discussed with the patient that there may be a patient responsible charge related to this service. The patient expressed understanding and agreed to proceed.   History of Present Illness: Pt with hx of asthma, depression, HTN,morbidobesity, fibroids,OA knees, CTS, preDM  Patient complains of pain in both hands and wrists that has been worse over the past 2 weeks.  Worse in the fourth and fifth fingers more so on the right side.  She has had problems lifting/carrying things in the hands due to the pain.  She thinks the hands are swollen.  She denies any numbness or tingling in the hands.  Pain radiates to the forearms.  Concern about having possible rheumatoid arthritis as her mother has rheumatoid arthritis.  She has been out of meloxicam. Also complains of ongoing pain in the right hip.  She reports 3 falls this year the last of which was about a month ago when the leg just gave out on her. Prescribe meloxicam and Cymbalta for OA of the knees but she tells me that she never filled the prescription for the Cymbalta because she read that it was for depression. She still has not yet gotten approved for the orange card at [facility] discount.  She states that they need copy of her taxes but states that she does not have any.   Outpatient Encounter Medications as of 03/29/2019  Medication Sig  . acetaminophen (TYLENOL) 500 MG tablet Take 1,000 mg by mouth every 6 (six) hours as needed for moderate pain or headache (headach).   Marland Kitchen albuterol (PROVENTIL) (5 MG/ML) 0.5% nebulizer solution Take 0.5 mLs (2.5 mg total) by nebulization every 6 (six)  hours as needed for wheezing or shortness of breath.  Marland Kitchen albuterol (VENTOLIN HFA) 108 (90 Base) MCG/ACT inhaler Inhale 1-2 puffs into the lungs every 6 (six) hours as needed for wheezing or shortness of breath.  Marland Kitchen amLODipine (NORVASC) 10 MG tablet Take 1 tablet (10 mg total) by mouth daily.  . carvedilol (COREG) 3.125 MG tablet Take 1 tablet (3.125 mg total) by mouth 2 (two) times daily with a meal.  . DULoxetine (CYMBALTA) 20 MG capsule Take 1 capsule (20 mg total) by mouth daily. (Patient not taking: Reported on 01/28/2019)  . gabapentin (NEURONTIN) 300 MG capsule 1 tab PO daily QHS x 1 wk then BID  . hydrochlorothiazide (HYDRODIURIL) 25 MG tablet Take 1 tablet (25 mg total) by mouth daily.  Marland Kitchen loratadine (CLARITIN) 10 MG tablet Take 1 tablet (10 mg total) by mouth daily.  . meloxicam (MOBIC) 15 MG tablet Take 1 tablet (15 mg total) by mouth daily.  . ondansetron (ZOFRAN) 4 MG tablet Take 1 tablet (4 mg total) by mouth every 6 (six) hours as needed for nausea.  . predniSONE (DELTASONE) 20 MG tablet Take 1 tablet (20 mg total) by mouth daily with breakfast. (Patient not taking: Reported on 01/28/2019)   No facility-administered encounter medications on file as of 03/29/2019.     Observations/Objective: No direct observation done as this was a telephone encounter.  Assessment and Plan: 1. Pain in both hands Patient to come to the lab to have blood test done to  check rheumatoid factor and CCP antibody. I recommend getting back on meloxicam. We will have her schedule for an in person visit next available appointment. - meloxicam (MOBIC) 15 MG tablet; Take 1 tablet (15 mg total) by mouth daily.  Dispense: 30 tablet; Refill: 6 - Rheumatoid factor; Future - CYCLIC CITRUL PEPTIDE ANTIBODY, IGG/IGA; Future  2. Pain in both wrists See #1 above - meloxicam (MOBIC) 15 MG tablet; Take 1 tablet (15 mg total) by mouth daily.  Dispense: 30 tablet; Refill: 6 - Rheumatoid factor; Future - CYCLIC CITRUL  PEPTIDE ANTIBODY, IGG/IGA; Future  3. Hip pain, chronic, right We will order an x-ray of the hip.  Recommend getting back on meloxicam.  Encouraged her to try the Cymbalta as well.  Advised her that Cymbalta is recommended for osteoarthritis pain - DG Hip Unilat W OR W/O Pelvis 2-3 Views Right; Future - meloxicam (MOBIC) 15 MG tablet; Take 1 tablet (15 mg total) by mouth daily.  Dispense: 30 tablet; Refill: 6  4. Primary osteoarthritis of both knees See #3 above  5. Essential hypertension - Basic Metabolic Panel   Follow Up Instructions: Next available in person   I discussed the assessment and treatment plan with the patient. The patient was provided an opportunity to ask questions and all were answered. The patient agreed with the plan and demonstrated an understanding of the instructions.   The patient was advised to call back or seek an in-person evaluation if the symptoms worsen or if the condition fails to improve as anticipated.  I provided 10 minutes of non-face-to-face time during this encounter.   Karle Plumber, MD

## 2019-03-30 MED FILL — MELOXICAM 15 MG TABLET: 15 | 30 days supply | Qty: 30 | Fill #0

## 2019-04-05 MED FILL — DULoxetine HCL 20 MG CPEP: 20 | 30 days supply | Qty: 30 | Fill #0

## 2019-04-05 MED FILL — AMLODIPINE BESYLATE 10 MG T: 10 | 30 days supply | Qty: 30 | Fill #0

## 2019-04-05 MED FILL — HYDROCHLOROTHIAZIDE 25 MG T: 25 | 30 days supply | Qty: 30 | Fill #0

## 2019-04-05 MED FILL — CARVEDILOL 3.125 MG TABLET: 3.125 | 30 days supply | Qty: 60 | Fill #1

## 2019-05-25 MED FILL — ?HYDROCHLOROTHIAZIDE 25MG T: 25 | 30 days supply | Qty: 30 | Fill #0

## 2019-05-25 MED FILL — !VENTOLIN HFA INHALER: 108 (90 BAS | 25 days supply | Qty: 18 | Fill #1

## 2019-05-25 MED FILL — ?CARVEDILOL 3.125 MG TABLET: 3.125 | 30 days supply | Qty: 60 | Fill #1

## 2019-05-25 MED FILL — DULoxetine HCL 20 MG CPEP: 20 | 30 days supply | Qty: 30 | Fill #0

## 2019-05-25 MED FILL — GABAPENTIN 300 MG CAPSULE: 300 | 30 days supply | Qty: 60 | Fill #1

## 2019-05-25 MED FILL — MELOXICAM 15 MG TABLET: 15 | 30 days supply | Qty: 30 | Fill #0

## 2019-05-25 MED FILL — ?ALBUTEROL 2.5 MG/0.5 ML SO: 2.5 | 15 days supply | Qty: 30 | Fill #1

## 2019-05-25 MED FILL — ?AMLODIPINE BESYLATE 10 MG: 10 | 30 days supply | Qty: 30 | Fill #0

## 2019-10-19 ENCOUNTER — Encounter: Payer: Self-pay | Admitting: Internal Medicine

## 2019-10-19 DIAGNOSIS — M170 Bilateral primary osteoarthritis of knee: Secondary | ICD-10-CM

## 2019-10-27 ENCOUNTER — Ambulatory Visit: Payer: Medicaid Other | Admitting: Orthopaedic Surgery

## 2019-11-10 ENCOUNTER — Ambulatory Visit (INDEPENDENT_AMBULATORY_CARE_PROVIDER_SITE_OTHER): Payer: Medicaid Other

## 2019-11-10 ENCOUNTER — Ambulatory Visit: Payer: Self-pay

## 2019-11-10 ENCOUNTER — Other Ambulatory Visit: Payer: Self-pay

## 2019-11-10 ENCOUNTER — Ambulatory Visit (INDEPENDENT_AMBULATORY_CARE_PROVIDER_SITE_OTHER): Payer: Medicaid Other | Admitting: Orthopaedic Surgery

## 2019-11-10 VITALS — Ht 61.0 in | Wt 374.0 lb

## 2019-11-10 DIAGNOSIS — G8929 Other chronic pain: Secondary | ICD-10-CM | POA: Diagnosis not present

## 2019-11-10 DIAGNOSIS — M1711 Unilateral primary osteoarthritis, right knee: Secondary | ICD-10-CM | POA: Diagnosis not present

## 2019-11-10 DIAGNOSIS — M1712 Unilateral primary osteoarthritis, left knee: Secondary | ICD-10-CM

## 2019-11-10 DIAGNOSIS — M25561 Pain in right knee: Secondary | ICD-10-CM

## 2019-11-10 DIAGNOSIS — M25562 Pain in left knee: Secondary | ICD-10-CM | POA: Diagnosis not present

## 2019-11-10 MED ORDER — LIDOCAINE HCL 1 % IJ SOLN
3.0000 mL | INTRAMUSCULAR | Status: AC | PRN
  Administered 2019-11-10: 3 mL

## 2019-11-10 MED ORDER — METHYLPREDNISOLONE ACETATE 40 MG/ML IJ SUSP
40.0000 mg | INTRAMUSCULAR | Status: AC | PRN
  Administered 2019-11-10: 40 mg via INTRA_ARTICULAR

## 2019-11-10 NOTE — Progress Notes (Signed)
Office Visit Note   Patient: Angela [name]           Date of Birth: [birth year]           MRN: [id number] Visit Date: 11/10/2019              Requested by: Ladell Pier, MD [address] [place],  [place] [address] PCP: Ladell Pier, MD   Assessment & Plan: Visit Diagnoses:  1. Unilateral primary osteoarthritis, left knee   2. Unilateral primary osteoarthritis, right knee   3. Chronic pain of left knee   4. Chronic pain of right knee     Plan: The patient understands that she does have quite significant arthritis of both knees.  There is really nothing else that we can offer her due to her weight and BMI with also significant large soft tissue envelopes around both knees that make it prohibitive for even considering knee replacement surgery.  She has not had steroid injections in either knee and I felt this was a good idea to try this as does she.  I explained the risk and benefits of steroid injections and was able to place a steroid in both knees without difficulty.  All questions and concerns were answered addressed.  Follow-up is as needed.  She knows to wait at least 4 months between injections.  Follow-Up Instructions: Return if symptoms worsen or fail to improve.   Orders:  Orders Placed This Encounter  Procedures  . Large Joint Inj  . Large Joint Inj  . XR Knee 1-2 Views Left  . XR Knee 1-2 Views Right   No orders of the defined types were placed in this encounter.     Procedures: Large Joint Inj: R knee on 11/10/2019 2:30 PM Indications: diagnostic evaluation and pain Details: 22 G 1.5 in needle, superolateral approach  Arthrogram: No  Medications: 3 mL lidocaine 1 %; 40 mg methylPREDNISolone acetate 40 MG/ML Outcome: tolerated well, no immediate complications Procedure, treatment alternatives, risks and benefits explained, specific risks discussed. Consent was given by the patient. Immediately prior to procedure a time out was called to verify the  correct patient, procedure, equipment, support staff and site/side marked as required. Patient was prepped and draped in the usual sterile fashion.   Large Joint Inj: L knee on 11/10/2019 2:30 PM Indications: diagnostic evaluation and pain Details: 22 G 1.5 in needle, superolateral approach  Arthrogram: No  Medications: 3 mL lidocaine 1 %; 40 mg methylPREDNISolone acetate 40 MG/ML Outcome: tolerated well, no immediate complications Procedure, treatment alternatives, risks and benefits explained, specific risks discussed. Consent was given by the patient. Immediately prior to procedure a time out was called to verify the correct patient, procedure, equipment, support staff and site/side marked as required. Patient was prepped and draped in the usual sterile fashion.       Clinical Data: No additional findings.   Subjective: Chief Complaint  Patient presents with  . Left Knee - Pain  . Right Knee - Pain  The patient is a very pleasant 44 year old who [facility] Dr. Wynetta Emery to evaluate and treat bilateral knee pain.  She has been having pain for about 2 years or more and has become pretty constant with her knees at this standpoint.  She is not a diabetic.  Her knees hurt with weightbearing.  Is mainly the medial aspect of both knees.  Her right knee hurts her worse the left knee.  She is never had surgery on her knees.  She is never had injections either.  She does weigh 374 pounds.  She is 5 foot 1 and BMI is calculated at 70.67. She does state that she has been referred to the weight loss clinic. HPI  Review of Systems She currently denies any headache, chest pain, shortness of breath, fever, chills, nausea, vomiting  Objective: Vital Signs: Ht 5\' 1"  (1.549 m)   Wt (!) 374 lb (169.6 kg)   BMI 70.67 kg/m   Physical Exam She is alert and orient x3 and in no acute distress Ortho Exam Examination of both knees shows a very large  soft tissue envelope around both knees that makes exam certainly difficult.  I can palpate the joint line.  Both knees have painful arc of motion.  It is too hard to assess ligamentous instability due to her body habitus. Specialty Comments:  No specialty comments available.  Imaging: XR Knee 1-2 Views Left  Result Date: 11/10/2019 2 views of the left knee show particular osteophytes in all 3 compartments.  There is joint space narrowing of the medial compartment and patellofemoral joint.  There is varus malalignment.  XR Knee 1-2 Views Right  Result Date: 11/10/2019 2 views of the right knee show tricompartment arthritic changes with varus malalignment, medial joint space narrowing, patellofemoral narrowing and periarticular osteophytes.    PMFS History: Patient Active Problem List   Diagnosis Date Noted  . Essential hypertension 07/08/2018  . Morbid obesity ([place]) 07/08/2018  . Neuropathy of both feet 07/08/2018  . Primary osteoarthritis of both knees 04/02/2018  . Foot pain, right 04/02/2018  . 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 HTN (hypertension), benign 12/17/2016  . Pain, pelvic, female 02/05/2012  . Fibroids 02/05/2012  . Abnormal uterine bleeding 02/05/2012   Past Medical History:  Diagnosis Date  . Asthma   . Depression    hx, doing ok now  . Headache(784.0)   . Heart murmur   . Hypertension    on meds  . Ovarian cyst   . Preterm labor     Family History  Problem Relation Age of Onset  . Hyperlipidemia Mother   . Cancer Son   . Hypertension Other   . Diabetes Other   . Other Neg Hx     Past Surgical History:  Procedure Laterality Date  . TUBAL LIGATION  1998   Social History   Occupational History  . Not on file  Tobacco Use  . Smoking status: Never Smoker  . Smokeless tobacco: Never Used  Substance and Sexual Activity  . Alcohol use: No  . Drug use: No  . Sexual activity: Yes    Birth control/protection: Condom, Surgical

## 2019-11-19 ENCOUNTER — Encounter: Payer: Self-pay | Admitting: Internal Medicine

## 2019-11-23 ENCOUNTER — Ambulatory Visit: Payer: Medicaid Other | Admitting: Internal Medicine

## 2019-11-25 ENCOUNTER — Encounter (INDEPENDENT_AMBULATORY_CARE_PROVIDER_SITE_OTHER): Payer: Self-pay | Admitting: Family Medicine

## 2019-11-25 ENCOUNTER — Ambulatory Visit (INDEPENDENT_AMBULATORY_CARE_PROVIDER_SITE_OTHER): Payer: Medicaid Other | Admitting: Family Medicine

## 2019-11-25 ENCOUNTER — Other Ambulatory Visit: Payer: Self-pay

## 2019-11-25 VITALS — BP 182/105 | HR 97 | Temp 98.8°F | Ht 61.0 in | Wt 367.0 lb

## 2019-11-25 DIAGNOSIS — Z6841 Body Mass Index (BMI) 40.0 and over, adult: Secondary | ICD-10-CM

## 2019-11-25 DIAGNOSIS — R0602 Shortness of breath: Secondary | ICD-10-CM

## 2019-11-25 DIAGNOSIS — R5383 Other fatigue: Secondary | ICD-10-CM | POA: Diagnosis not present

## 2019-11-25 DIAGNOSIS — J45909 Unspecified asthma, uncomplicated: Secondary | ICD-10-CM

## 2019-11-25 DIAGNOSIS — F3289 Other specified depressive episodes: Secondary | ICD-10-CM

## 2019-11-25 DIAGNOSIS — R7303 Prediabetes: Secondary | ICD-10-CM | POA: Diagnosis not present

## 2019-11-25 DIAGNOSIS — E559 Vitamin D deficiency, unspecified: Secondary | ICD-10-CM

## 2019-11-25 DIAGNOSIS — I10 Essential (primary) hypertension: Secondary | ICD-10-CM | POA: Diagnosis not present

## 2019-11-25 DIAGNOSIS — G4719 Other hypersomnia: Secondary | ICD-10-CM

## 2019-11-25 DIAGNOSIS — Z0289 Encounter for other administrative examinations: Secondary | ICD-10-CM

## 2019-11-25 NOTE — Progress Notes (Signed)
Dear Dr. Wynetta [name],   Thank you for referring Angela Angela [name] to our clinic. The following note includes my evaluation and treatment recommendations.  Chief Complaint:   OBESITY Angela Angela [name] (MR# [id number]) is a 44 y.o. female who presents for evaluation and treatment of obesity and related comorbidities. Current BMI is Body mass index is 69.34 kg/m. Angela Angela [name] has been struggling with her weight for many years and has been unsuccessful in either losing weight, maintaining weight loss, or reaching her healthy weight goal.  Angela Angela [name] is currently in the action stage of change and ready to dedicate time achieving and maintaining a healthier weight. Angela Angela [name] is interested in becoming our patient and working on intensive lifestyle modifications including (but not limited to) diet and exercise for weight loss.  Angela Angela [name] has not seen her PCP since 03/29/2019.  She recently got Medicaid and prior was not able to afford going to the doctor.  No one in the home works, and she lives in the home with 8 other people including 4 different generations.  Angela Angela [name] and her daughter do the cooking.  She normally only eats once a day Angela [name] to hunger.  She is unable to move Angela [name] to knee pain, etc.  She was told by Dr. Rush Angela [name] of Orthopedics that she needs to lose weight to minimize symptoms.  Her ideal weight is 175-200 pounds.    Angela Angela [name] to depression.  She has been "homeless" and living with her mom in a 1 bedroom house for 1.5 years now.  She had to take a taxi in order to get here for her appointment today.  She sleeps on a couch.  She drinks 2-3 20 ounce sodas per day.  Prior (2 or so months ago) she was drinking 1 case of soda per day.  [name]'s habits were reviewed today and are as follows: Her family eats meals together, she thinks her family will eat healthier with her, her desired weight loss is 175  pounds, she started gaining weight in 2015, 2016, her heaviest weight ever was 374 pounds, she craves crab legs, seafood boil, she is a picky eater, she snacks frequently in the evenings, she wakes up frequently in the middle of the night to eat, she skips breakfast/lunch frequently, she is frequently drinking liquids with calories, she frequently makes poor food choices, she has problems with excessive hunger and she struggles with emotional eating.  Depression Screen [name]'s Food and Mood (modified PHQ-9) score was 13.  Depression screen [name] 2/9 11/25/2019  Decreased Interest 3  Down, Depressed, Hopeless 1  PHQ - 2 Score 4  Altered sleeping 1  Tired, decreased energy 3  Change in appetite 2  Feeling bad or failure about yourself  1  Trouble concentrating 1  Moving slowly or fidgety/restless 1  Suicidal thoughts 0  PHQ-9 Score 13  Difficult doing work/chores Somewhat difficult   Subjective:   1. Other fatigue [name] admits to daytime somnolence and reports waking up still tired. Patent has a history of symptoms of daytime fatigue, morning fatigue, morning headache and snoring. [name] generally gets 4 hours of sleep per night, and states that she has poor sleep quality. Snoring is present. Apneic episodes are present. Epworth Sleepiness Score is 21.  2. Shortness of breath on exertion [name] notes increasing shortness of breath with exercising and seems to be worsening over time with weight gain.  She notes getting out of breath sooner with activity than she used to. This has gotten worse recently. [name] denies shortness of breath at rest or orthopnea.  3. Essential hypertension [name] has difficulty with medication adherence.  She recently ordered a blood pressure monitor.  Home blood pressures 189/100-110s, 165/90s is lowest she has seen.  She checks daily.  BP Readings from Last 3 Encounters:  11/25/19 (!) 182/105  08/20/18 (!) 136/99  07/29/18 (!) 180/117   4.  Prediabetes [name] has a diagnosis of prediabetes based on her elevated HgA1c and was informed this puts her at greater risk of developing diabetes. She continues to work on diet and exercise to decrease her risk of diabetes. She denies nausea or hypoglycemia.  [name] eats a diet that is very rich in simple carbs currently with essentially little to no proteins or other nutrients.  A1c when last done was 5.7.    Lab Results  Component Value Date   HGBA1C 5.7 (H) 08/20/2018   5. Reactive airway disease without complication, unspecified asthma severity, unspecified whether persistent Well-controlled.  [name] only uses albuterol on average 2-3 times a month if exposed to dust, allergens, etc.    6. Vitamin D deficiency She is currently taking no vitamin D supplement, and has not been on a supplement for a long time.   7. Excessive daytime sleepiness [name] has poor sleep hygeine.  She sleeps on the couch with her grandchildren on her.  She goes to sleep around 7 am and usually sleeps until 1-2 pm.  She is in pain and cannot get comfortable. She did not show for a scheduled sleep study evaluation in the past Angela [name] to no transportation.  Epwort sleepiness score is 21.  8. Other depression, with emotional eating [name] is struggling with emotional eating and using food for comfort to the extent that it is negatively impacting her health. She has been working on behavior modification techniques to help reduce her emotional eating and has been unsuccessful. She shows no sign of suicidal or homicidal ideations.  [name] put on a large amount of weight with personal losses in 2015 and 2016.  She struggles with depression and emotional eating.  She is currently homeless and living with her mom in a 1 bedroom house with a total of 9 persons.  She has significant social challenges.  PHQ-9 is 13.  Assessment/Plan:   1. Other fatigue [name] does feel that her weight is causing her energy to be  lower than it should be. Fatigue may be related to obesity, depression or many other causes. Labs will be ordered, and in the meanwhile, [name] will focus on self care including making healthy food choices, increasing physical activity and focusing on stress reduction.  Check labs, prudent nutritional plan, weight loss. - EKG 12-Lead - CBC with Differential/Platelet - Lipid panel - T4, free - T3, free - TSH - Vitamin B12 - Folate  2. Shortness of breath on exertion Diamonique does feel that she gets out of breath more easily that she used to when she exercises. Rhya's shortness of breath appears to be obesity related and exercise induced. She has agreed to work on weight loss and gradually increase exercise to treat her exercise induced shortness of breath. Will continue to monitor closely.  3. Essential hypertension Tracina is working on healthy weight loss and exercise to improve blood pressure control.  She has not been consistent with her BP med regimen.  Her BP is NOT at goal today.  D/c pt detriments to health and wellbeing with too high of BP's. - We will watch for signs of hypotension as she continues her lifestyle modifications.  Jahnaya will check her blood pressure daily and keep blood pressure log that we will review next OV.  Check labs, prudent nutritional plan, Wt loss.  Will follow-up with [place] in 2 weeks, or call sooner if concerns.  Change if needed. - Comprehensive metabolic panel  4. Prediabetes Tamyra will continue to work on weight loss, exercise, and decreasing simple carbohydrates to help decrease the risk of diabetes.  Check labs, prudent nutritional plan, cut out all caloric beverages as #1 goal, weight loss.  Treatment as needed. - Hemoglobin A1c - Insulin, random  5. Reactive airway disease without complication, unspecified asthma severity, unspecified whether persistent Well-controlled.  Continue current plan.  Follow-up with PCP.  6. Vitamin D  deficiency Low Vitamin D level contributes to fatigue and are associated with obesity, breast, and colon cancer.  Check labs, weight loss, prudent nutritional plan. - VITAMIN D 25 Hydroxy (Vit-D Deficiency, Fractures)  7. Excessive daytime sleepiness Minh will make a follow-up appointment with her PCP in the near future to discuss OSA symptoms, chronic pain, and other issues.  Weight loss, prudent nutritional plan.  8. Other depression, with emotional eating Patient was referred to Dr. Mallie Mussel, our Bariatric Psychologist, for evaluation Angela [name] to her elevated PHQ-9 score and significant struggles with emotional eating.  Check labs, prudent nutritional plan, weight loss.  9. Class 3 severe obesity with serious comorbidity and body mass index (BMI) of 60.0 to 69.9 in adult, unspecified obesity type (HCC) Alwilda is currently in the action stage of change and her goal is to continue with weight loss efforts. I recommend Rheanna begin the structured treatment plan as follows:  Exercise goals: No exercise has been prescribed at this time.  Follow nutrition plan.  Behavioral modification strategies: decreasing simple carbohydrates, increasing water intake and decreasing liquid calories, decrease soda intake.  She was informed of the importance of frequent follow-up visits to maximize her success with intensive lifestyle modifications for her multiple health conditions. She was informed we would discuss her lab results at her next visit unless there is a critical issue that needs to be addressed sooner. Anzleigh agreed to keep her next visit at the agreed upon time to discuss these results.  Objective:   Blood pressure (!) 182/105, pulse 97, temperature 98.8 F (37.1 C), temperature source Oral, height 5\' 1"  (1.549 m), weight (!) 367 lb (166.5 kg), last menstrual period 11/06/2019, SpO2 97 %. Body mass index is 69.34 kg/m.  EKG: Normal sinus rhythm, rate 95 bpm.  Indirect Calorimeter completed  today shows a VO2 of 200 and a REE of 1390.  Her calculated basal metabolic rate is 8299 thus her basal metabolic rate is worse than expected.  General: Cooperative, alert, well developed, in no acute distress. HEENT: Conjunctivae and lids unremarkable. Cardiovascular: Regular rhythm.  Lungs: Normal work of breathing. Neurologic: No focal deficits.   Lab Results  Component Value Date   CREATININE 1.04 (H) 07/26/2018   BUN 13 07/26/2018   NA 137 07/26/2018   K 3.5 07/26/2018   CL 106 07/26/2018   CO2 24 07/26/2018   Lab Results  Component Value Date   ALT 10 07/26/2018   AST 14 (L) 07/26/2018   ALKPHOS 79 07/26/2018   BILITOT 0.5 07/26/2018   Lab Results  Component Value Date   HGBA1C 5.7 (H) 08/20/2018   HGBA1C  5.5 08/06/2016   Lab Results  Component Value Date   TSH 3.110 11/10/2017   Lab Results  Component Value Date   CHOL 173 04/02/2018   HDL 41 04/02/2018   LDLCALC 120 (H) 04/02/2018   TRIG 61 04/02/2018   CHOLHDL 4.2 04/02/2018   Lab Results  Component Value Date   WBC 7.5 07/26/2018   HGB 13.2 07/26/2018   HCT 42.7 07/26/2018   MCV 87.5 07/26/2018   PLT 308 07/26/2018   Attestation Statements:   Reviewed by clinician on day of visit: allergies, medications, problem list, medical history, surgical history, family history, social history, and previous encounter notes.  Time spent on visit including pre-visit chart review and post-visit charting and care was 61.5 minutes.   I, Water quality scientist, CMA, am acting as Location manager for [employer], DO.  I have reviewed the above documentation for accuracy and completeness, and I agree with the above. Mellody Dance, DO
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
## 2019-11-26 LAB — COMPREHENSIVE METABOLIC PANEL
ALT: 11 IU/L (ref 0–32)
AST: 11 IU/L (ref 0–40)
Albumin/Globulin Ratio: 1.1 — ABNORMAL LOW (ref 1.2–2.2)
Albumin: 3.9 g/dL (ref 3.8–4.8)
Alkaline Phosphatase: 97 IU/L (ref 48–121)
BUN/Creatinine Ratio: 14 (ref 9–23)
BUN: 16 mg/dL (ref 6–24)
Bilirubin Total: 0.3 mg/dL (ref 0.0–1.2)
CO2: 25 mmol/L (ref 20–29)
Calcium: 9.3 mg/dL (ref 8.7–10.2)
Chloride: 104 mmol/L (ref 96–106)
Creatinine, Ser: 1.15 mg/dL — ABNORMAL HIGH (ref 0.57–1.00)
GFR calc Af Amer: 66 mL/min/{1.73_m2} (ref >59)
GFR calc non Af Amer: 57 mL/min/{1.73_m2} — ABNORMAL LOW (ref >59)
Globulin, Total: 3.5 g/dL (ref 1.5–4.5)
Glucose: 95 mg/dL (ref 65–99)
Potassium: 4.2 mmol/L (ref 3.5–5.2)
Sodium: 141 mmol/L (ref 134–144)
Total Protein: 7.4 g/dL (ref 6.0–8.5)

## 2019-11-26 LAB — CBC WITH DIFFERENTIAL/PLATELET
Basophils Absolute: 0 10*3/uL (ref 0.0–0.2)
Basos: 0 %
EOS (ABSOLUTE): 0.1 10*3/uL (ref 0.0–0.4)
Eos: 2 %
Hematocrit: 40.5 % (ref 34.0–46.6)
Hemoglobin: 13 g/dL (ref 11.1–15.9)
Immature Grans (Abs): 0 10*3/uL (ref 0.0–0.1)
Immature Granulocytes: 0 %
Lymphocytes Absolute: 2 10*3/uL (ref 0.7–3.1)
Lymphs: 25 %
MCH: 27.3 pg (ref 26.6–33.0)
MCHC: 32.1 g/dL (ref 31.5–35.7)
MCV: 85 fL (ref 79–97)
Monocytes Absolute: 0.6 10*3/uL (ref 0.1–0.9)
Monocytes: 7 %
Neutrophils Absolute: 5.3 10*3/uL (ref 1.4–7.0)
Neutrophils: 66 %
Platelets: 312 10*3/uL (ref 150–450)
RBC: 4.77 x10E6/uL (ref 3.77–5.28)
RDW: 12.5 % (ref 11.7–15.4)
WBC: 8 10*3/uL (ref 3.4–10.8)

## 2019-11-26 LAB — LIPID PANEL
Chol/HDL Ratio: 3.5 ratio (ref 0.0–4.4)
Cholesterol, Total: 180 mg/dL (ref 100–199)
HDL: 51 mg/dL (ref >39)
LDL Chol Calc (NIH): 115 mg/dL — ABNORMAL HIGH (ref 0–99)
Triglycerides: 74 mg/dL (ref 0–149)
VLDL Cholesterol Cal: 14 mg/dL (ref 5–40)

## 2019-11-26 LAB — INSULIN, RANDOM: INSULIN: 14.8 u[IU]/mL (ref 2.6–24.9)

## 2019-11-26 LAB — T4, FREE: Free T4: 1.41 ng/dL (ref 0.82–1.77)

## 2019-11-26 LAB — TSH: TSH: 5.32 u[IU]/mL — ABNORMAL HIGH (ref 0.450–4.500)

## 2019-11-26 LAB — VITAMIN D 25 HYDROXY (VIT D DEFICIENCY, FRACTURES): Vit D, 25-Hydroxy: 9.4 ng/mL — ABNORMAL LOW (ref 30.0–100.0)

## 2019-11-26 LAB — T3, FREE: T3, Free: 3.3 pg/mL (ref 2.0–4.4)

## 2019-11-26 LAB — HEMOGLOBIN A1C
Est. average glucose Bld gHb Est-mCnc: 120 mg/dL
Hgb A1c MFr Bld: 5.8 % — ABNORMAL HIGH (ref 4.8–5.6)

## 2019-11-26 LAB — FOLATE: Folate: 7.5 ng/mL (ref >3.0)

## 2019-11-26 LAB — VITAMIN B12: Vitamin B-12: 697 pg/mL (ref 232–1245)

## 2019-12-02 ENCOUNTER — Other Ambulatory Visit: Payer: Self-pay

## 2019-12-02 ENCOUNTER — Telehealth (INDEPENDENT_AMBULATORY_CARE_PROVIDER_SITE_OTHER): Payer: Medicaid Other | Admitting: Psychology

## 2019-12-02 ENCOUNTER — Other Ambulatory Visit: Payer: Self-pay | Admitting: Internal Medicine

## 2019-12-02 ENCOUNTER — Encounter: Payer: Self-pay | Admitting: Internal Medicine

## 2019-12-02 DIAGNOSIS — J4520 Mild intermittent asthma, uncomplicated: Secondary | ICD-10-CM

## 2019-12-02 DIAGNOSIS — Z419 Encounter for procedure for purposes other than remedying health state, unspecified: Secondary | ICD-10-CM | POA: Diagnosis not present

## 2019-12-02 DIAGNOSIS — F331 Major depressive disorder, recurrent, moderate: Secondary | ICD-10-CM | POA: Diagnosis not present

## 2019-12-02 MED ORDER — ALBUTEROL SULFATE HFA 108 (90 BASE) MCG/ACT IN AERS: 1.0000 | INHALATION_SPRAY | Freq: Four times a day (QID) | RESPIRATORY_TRACT | 3 refills | Status: DC | PRN

## 2019-12-02 MED ORDER — ALBUTEROL SULFATE (5 MG/ML) 0.5% IN NEBU: 2.5000 mg | INHALATION_SOLUTION | Freq: Four times a day (QID) | RESPIRATORY_TRACT | 6 refills | Status: DC | PRN

## 2019-12-02 NOTE — Telephone Encounter (Signed)
Requested medication (s) are due for refill today: ventolin inhaler & albuterol neb solution  Requested medication (s) are on the active medication list: yes   Last refill:  05/25/2019  Future visit scheduled: no  Notes to clinic:  is pt still seen in clinic; see note dated 11/25/19    Requested Prescriptions  Pending Prescriptions Disp Refills   VENTOLIN HFA 108 (90 Base) MCG/ACT inhaler [Pharmacy Med Name: VENTOLIN HFA INHALER 108 (90 BAS Aerosol] 18 g 6    Sig: INHALE 1-2 PUFFS INTO THE LUNGS EVERY 6 (SIX) HOURS AS NEEDED FOR WHEEZING OR SHORTNESS OF BREATH.      Pulmonology:  Beta Agonists Failed - 12/02/2019 11:25 AM      Failed - One inhaler should last at least one month. If the patient is requesting refills earlier, contact the patient to check for uncontrolled symptoms.      Failed - Valid encounter within last 12 months    Recent Outpatient Visits           8 months ago Pain in both hands   [place], MD   10 months ago Primary osteoarthritis of both [place], Dionne Bucy, [place]   1 year ago Essential hypertension   [place], Deborah B, MD   1 year ago Sinus congestion   [place], MD   1 year ago Morbid obesity [facility])   [place], MD       Future Appointments             Today Nash Dimmer, PsyD [facility]              Albuterol Sulfate 2.5 MG/0.5ML NEBU [Pharmacy Med Name: ALBUTEROL 2.5 MG/0.5 ML SO 2.5 Nebulization Solution]  6    Sig: TAKE 2.5 MG (1 VIAL) BY NEBULIZATION EVERY 6 (SIX) HOURS AS NEEDED FOR WHEEZING OR SHORTNESS OF BREATH.      Pulmonology:  Beta Agonists Failed - 12/02/2019 11:25 AM      Failed - One inhaler should last at least one month. If the patient is requesting refills earlier,  contact the patient to check for uncontrolled symptoms.      Failed - Valid encounter within last 12 months    Recent Outpatient Visits           8 months ago Pain in both hands   [place], MD   10 months ago Primary osteoarthritis of both [place], Dionne Bu[place], [place]   1 year ago Essential hypertension   [place], MD   1 year ago Sinus congestion   [place], Deborah B, MD   1 year ago Morbid obesity [facility])   [place], MD       Future Appointments             Today Nash Dimmer, [place]

## 2019-12-02 NOTE — Telephone Encounter (Signed)
   Last refill: 05/25/2019  Future visit scheduled:yes  Notes to clinic:  Patient is has appointment today Review meds for refills   Requested Prescriptions  Pending Prescriptions Disp Refills   VENTOLIN HFA 108 (90 Base) MCG/ACT inhaler [Pharmacy Med Name: VENTOLIN HFA INHALER 108 (90 BAS Aerosol] 18 g 6    Sig: INHALE 1-2 PUFFS INTO THE LUNGS EVERY 6 (SIX) HOURS AS NEEDED FOR WHEEZING OR SHORTNESS OF BREATH.      Pulmonology:  Beta Agonists Failed - 12/02/2019 11:25 AM      Failed - One inhaler should last at least one month. If the patient is requesting refills earlier, contact the patient to check for uncontrolled symptoms.      Failed - Valid encounter within last 12 months    Recent Outpatient Visits           8 months ago Pain in both hands   [place], MD   10 months ago Primary osteoarthritis of both [place], Dionne Bucy, [place]   1 year ago Essential hypertension   [place], Deborah B, MD   1 year ago Sinus congestion   [place], MD   1 year ago Morbid obesity [facility])   [place], MD       Future Appointments             Today Nash Dimmer, PsyD [facility]              Albuterol Sulfate 2.5 MG/0.5ML NEBU [Pharmacy Med Name: ALBUTEROL 2.5 MG/0.5 ML SO 2.5 Nebulization Solution]  6    Sig: TAKE 2.5 MG (1 VIAL) BY NEBULIZATION EVERY 6 (SIX) HOURS AS NEEDED FOR WHEEZING OR SHORTNESS OF BREATH.      Pulmonology:  Beta Agonists Failed - 12/02/2019 11:25 AM      Failed - One inhaler should last at least one month. If the patient is requesting refills earlier, contact the patient to check for uncontrolled symptoms.      Failed - Valid encounter within last 12 months    Recent Outpatient Visits           8  months ago Pain in both hands   [place], MD   10 months ago Primary osteoarthritis of both [place], Dionne [place], [place]   1 year ago Essential hypertension   [place], MD   1 year ago Sinus congestion   [place], Deborah B, MD   1 year ago Morbid obesity [facility])   [place], MD       Future Appointments             Today Nash Dimmer, [place]

## 2019-12-03 ENCOUNTER — Other Ambulatory Visit: Payer: Self-pay

## 2019-12-03 ENCOUNTER — Ambulatory Visit: Payer: Medicaid Other | Attending: Internal Medicine | Admitting: Internal Medicine

## 2019-12-03 ENCOUNTER — Encounter: Payer: Self-pay | Admitting: Internal Medicine

## 2019-12-03 DIAGNOSIS — J4521 Mild intermittent asthma with (acute) exacerbation: Secondary | ICD-10-CM | POA: Diagnosis not present

## 2019-12-03 MED ORDER — AZITHROMYCIN 250 MG PO TABS: ORAL_TABLET | ORAL | 0 refills | Status: DC

## 2019-12-03 MED ORDER — BENZONATATE 100 MG PO CAPS: 100.0000 mg | ORAL_CAPSULE | Freq: Three times a day (TID) | ORAL | 0 refills | Status: DC | PRN

## 2019-12-03 MED ORDER — PREDNISONE 10 MG PO TABS: ORAL_TABLET | ORAL | 0 refills | Status: DC

## 2019-12-03 MED FILL — BENZONATATE 100 MG CAPS: 100 | 10 days supply | Qty: 30 | Fill #0

## 2019-12-03 MED FILL — predniSONE 10 MG TABS: 10 | 6 days supply | Qty: 12 | Fill #0

## 2019-12-03 MED FILL — ALBUTEROL SUL 2.5 MG/3 ML S: (2.5 MG/3ML | 12 days supply | Qty: 150 | Fill #0

## 2019-12-03 MED FILL — PROAIR HFA 90 MCG INHALER: 108 (90 BAS | 25 days supply | Qty: 9 | Fill #0

## 2019-12-03 MED FILL — AZITHROMYCIN 250 MG TABLET: 250 | 5 days supply | Qty: 6 | Fill #0

## 2019-12-03 NOTE — Progress Notes (Signed)
Pt complains of:  Coughing with different colors of phlegm and it's thick wheezing  Sob Headaches  Pt denies:  No loss or taste of smell Fever Chest pain Body aches   Pt states she doesn't want to eat   Pt states she has been taking

## 2019-12-03 NOTE — Progress Notes (Signed)
Virtual Visit via Telephone Note Due to current restrictions/limitations of in-office visits due to the COVID-19 pandemic, this scheduled clinical appointment was converted to a telehealth visit I connected with Angela [name] on 12/03/19 at 3:08 p.m by telephone and verified that I am speaking with the correct person using two identifiers.   I am in my office.  The patient is at home.  Only the patient and myself participated in this encounter.  I discussed the limitations, risks, security and privacy concerns of performing an evaluation and management service by telephone and the availability of in person appointments. I also discussed with the patient that there may be a patient responsible charge related to this service. The patient expressed understanding and agreed to proceed.  History of Present Illness: Pt with hx of asthma, depression, HTN,morbidobesity, fibroids,OA knees, CTS, preDM.  Last eval 03/2019.  She did not keep in-person appt that was scheduled last wk.  Purpose of today's visit is UC   Pt c/o "real bad cough" productive of thick mucous x 3 days. Head hurts because of the coughing.  +SOB, hoarsness and wheezing. LT side of chest hurts when she coughs.   -no fever, loss of taste or swell.  She has not had COVID vaccine because she is afraid.  -no sick contacts.  Taking Coricidin and drinking OJ -she is out of Albuterol inh and needs to get a new extension cord for her nebulizer machine.  She had requested refills from me via MyChart a few days ago and the refills were sent to the pharmacy.  She states she was supposed to pick them up yesterday but lacked transportation.  She will be sending her niece today to pick up the medications.    Outpatient Encounter Medications as of 12/03/2019  Medication Sig  . acetaminophen (TYLENOL) 500 MG tablet Take 1,000 mg by mouth every 6 (six) hours as needed for moderate pain or headache (headach).   Marland Kitchen albuterol (PROVENTIL) (5 MG/ML) 0.5%  nebulizer solution Take 0.5 mLs (2.5 mg total) by nebulization every 6 (six) hours as needed for wheezing or shortness of breath.  Marland Kitchen albuterol (VENTOLIN HFA) 108 (90 Base) MCG/ACT inhaler Inhale 1-2 puffs into the lungs every 6 (six) hours as needed for wheezing or shortness of breath.  Marland Kitchen amLODipine (NORVASC) 10 MG tablet Take 1 tablet (10 mg total) by mouth daily. (Patient not taking: Reported on 11/25/2019)  . carvedilol (COREG) 3.125 MG tablet Take 1 tablet (3.125 mg total) by mouth 2 (two) times daily with a meal.  . DULoxetine (CYMBALTA) 20 MG capsule Take 1 capsule (20 mg total) by mouth daily. (Patient not taking: Reported on 01/28/2019)  . gabapentin (NEURONTIN) 300 MG capsule 1 tab PO daily QHS x 1 wk then BID (Patient not taking: Reported on 11/25/2019)  . hydrochlorothiazide (HYDRODIURIL) 25 MG tablet Take 1 tablet (25 mg total) by mouth daily.  Marland Kitchen loratadine (CLARITIN) 10 MG tablet Take 1 tablet (10 mg total) by mouth daily. (Patient not taking: Reported on 11/25/2019)  . meloxicam (MOBIC) 15 MG tablet Take 1 tablet (15 mg total) by mouth daily.  . ondansetron (ZOFRAN) 4 MG tablet Take 1 tablet (4 mg total) by mouth every 6 (six) hours as needed for nausea.   No facility-administered encounter medications on file as of 12/03/2019.      Observations/Objective: No direct observation done as this was a telephone encounter.  However patient had mild hoarseness and sounded slightly congested.  She was able to talk  in full sentences. Depression screen [facility] 2/9 11/25/2019 01/28/2019 08/20/2018 07/07/2018 04/02/2018  Decreased Interest 3 0 1 1 2   Down, Depressed, Hopeless 1 0 1 1 1   PHQ - 2 Score 4 0 2 2 3   Altered sleeping 1 0 1 1 2   Tired, decreased energy 3 0 1 1 2   Change in appetite 2 0 1 1 2   Feeling bad or failure about yourself  1 0 1 1 1   Trouble concentrating 1 0 1 0 1  Moving slowly or fidgety/restless 1 0 - 0 0  Suicidal thoughts 0 0 - 0 0  PHQ-9 Score 13 0 7 6 11   Difficult doing  work/chores Somewhat difficult Not difficult at all - - -    Assessment and Plan: 1. Mild intermittent asthma with exacerbation Patient currently has what sounds like an asthma exacerbation due to acute bronchitis.  I will prescribe Zithromax, prednisone taper and some Tessalon Perles.  She will also pick up refills on the albuterol inhaler and the nebulizer treatment.  Follow-up if no improvement or any worsening. -Advised patient that when she gets over this acute episode she should get the COVID-19 vaccine as it is safe and effective in preventing Covid infection.  If she gets Covid with her history of asthma it could be detrimental. - predniSONE (DELTASONE) 10 MG tablet; 3 tabs p.o. daily x 2 days then 2 tabs daily x 2 days then 1 tab daily x 2 days  Dispense: 12 tablet; Refill: 0 - azithromycin (ZITHROMAX Z-PAK) 250 MG tablet; 2 tablets p.o. x1 then 1 tablet daily  Dispense: 6 each; Refill: 0 - benzonatate (TESSALON PERLES) 100 MG capsule; Take 1 capsule (100 mg total) by mouth 3 (three) times daily as needed for cough.  Dispense: 30 capsule; Refill: 0   Follow Up Instructions: PRN She will call and schedule her routine follow-up visit when she is ready.   I discussed the assessment and treatment plan with the patient. The patient was provided an opportunity to ask questions and all were answered. The patient agreed with the plan and demonstrated an understanding of the instructions.   The patient was advised to call back or seek an in-person evaluation if the symptoms worsen or if the condition fails to improve as anticipated.  I provided 10 minutes of non-face-to-face time during this encounter.   Karle Plumber, MD

## 2019-12-07 NOTE — Progress Notes (Signed)
Office: [phone]  /  Fax: [phone]    Date: December 21, 2019   Appointment Start Time: 2:33pm Duration: 22 minutes Provider: Glennie Isle, Psy.D. Type of Session: Individual Therapy  Location of Patient: Home Location of Provider: [facility] Type of Contact: Telepsychological Visit via MyChart Video Visit  Session Content: Angela [name] is a 44 y.o. female presenting via [place] Visit for a follow-up appointment to address the previously established treatment goal of increasing coping skills. Today's appointment was a telepsychological visit due to COVID-19. Angela [name] provided verbal consent for today's telepsychological appointment and she is aware she is responsible for securing confidentiality on her end of the session. Prior to proceeding with today's appointment, Angela [name]'s physical location at the time of this appointment was obtained as well a phone number she could be reached at in the event of technical difficulties. Angela [name] and this provider participated in today's telepsychological service.   This provider conducted a brief check-in. Angela [name] shared, "I feel like I have the weight of the world on my shoulders." She shared about recent stressors, including her mother suffering strokes. Associated thoughts and feelings were processed. She noted plans to continue speaking with the hospital social worker. Additionally, this provider explored how Angela [name] has been coping with the aforementioned. She noted she tries to distract herself and prays. Angela [name] stated DSS sent her a letter indicating she is "misusing" services (e.g., food stamps). She was reportedly advised by the employee she communicates with to keep her upcoming appointment with DSS to address the concern.  Moreover, this provider encouraged her to reach out to [facility] as previously discussed. Angela [name] described placing herself on the back burner due to ongoing stressors.  Thus, psychoeducation regarding the importance of self-care utilizing the oxygen mask metaphor was provided. Psychoeducation regarding pleasurable activities, including its impact on emotional eating and overall well-being was also provided. Angela [name] was provided with a handout with various options of pleasurable activities, and was encouraged to engage in one activity a day and additional activities as needed when triggered to emotionally eat. Angela [name] agreed. Angela [name] provided verbal consent during today's appointment for this provider to send a handout with pleasurable activities via e-mail. Angela [name] was receptive to today's appointment as evidenced by openness to sharing, responsiveness to feedback, and willingness to engage in pleasurable activities to assist with coping.  Mental Status Examination:  Appearance: well groomed and appropriate hygiene  Behavior: appropriate to circumstances Mood: sad Affect: mood congruent; tearful Speech: normal in rate, volume, and tone Eye Contact: appropriate Psychomotor Activity: appropriate Gait: unable to assess Thought Process: linear, logical, and goal directed  Thought Content/Perception: denies suicidal and homicidal ideation, plan, and intent and no hallucinations, delusions, bizarre thinking or behavior reported or observed Orientation: time, person, place, and purpose of appointment Memory/Concentration: memory, attention, language, and fund of knowledge intact  Insight/Judgment: fair  Interventions:  Conducted a brief chart review Provided empathic reflections and validation Employed supportive psychotherapy interventions to facilitate reduced distress and to improve coping skills with identified stressors Psychoeducation provided regarding pleasurable activities Psychoeducation provided regarding self-care  DSM-5 Diagnosis(es): 296.32 (F33.1) Major Depressive Disorder, Recurrent Episode, Moderate, With Anxious Distress  Treatment Goal &  Progress: During the initial appointment with this provider, the following treatment goal was established: increase coping skills. Progress is limited, as Angela [name] has just begun treatment with this provider; however, she is receptive to the interaction and interventions and rapport is being established.   Plan: The next appointment will be  scheduled in approximately two weeks, which will be via MyChart Video Visit. The next session will focus on working towards the established treatment goal.

## 2019-12-09 ENCOUNTER — Encounter (INDEPENDENT_AMBULATORY_CARE_PROVIDER_SITE_OTHER): Payer: Self-pay | Admitting: Family Medicine

## 2019-12-09 ENCOUNTER — Ambulatory Visit (INDEPENDENT_AMBULATORY_CARE_PROVIDER_SITE_OTHER): Payer: Medicaid Other | Admitting: Family Medicine

## 2019-12-09 ENCOUNTER — Telehealth (INDEPENDENT_AMBULATORY_CARE_PROVIDER_SITE_OTHER): Payer: Self-pay | Admitting: Family Medicine

## 2019-12-09 ENCOUNTER — Encounter (INDEPENDENT_AMBULATORY_CARE_PROVIDER_SITE_OTHER): Payer: Self-pay | Admitting: *Deleted

## 2019-12-09 ENCOUNTER — Other Ambulatory Visit: Payer: Self-pay

## 2019-12-09 VITALS — BP 160/83 | HR 80 | Temp 98.6°F | Ht 61.0 in | Wt 360.0 lb

## 2019-12-09 DIAGNOSIS — Z0000 Encounter for general adult medical examination without abnormal findings: Secondary | ICD-10-CM

## 2019-12-09 DIAGNOSIS — Z6841 Body Mass Index (BMI) 40.0 and over, adult: Secondary | ICD-10-CM | POA: Diagnosis not present

## 2019-12-09 DIAGNOSIS — E559 Vitamin D deficiency, unspecified: Secondary | ICD-10-CM

## 2019-12-09 DIAGNOSIS — R7989 Other specified abnormal findings of blood chemistry: Secondary | ICD-10-CM | POA: Diagnosis not present

## 2019-12-09 DIAGNOSIS — R5383 Other fatigue: Secondary | ICD-10-CM

## 2019-12-09 DIAGNOSIS — E7849 Other hyperlipidemia: Secondary | ICD-10-CM

## 2019-12-09 DIAGNOSIS — I10 Essential (primary) hypertension: Secondary | ICD-10-CM

## 2019-12-09 DIAGNOSIS — R7303 Prediabetes: Secondary | ICD-10-CM

## 2019-12-09 DIAGNOSIS — E66813 Obesity, class 3: Secondary | ICD-10-CM

## 2019-12-09 MED ORDER — OLMESARTAN-AMLODIPINE-HCTZ 20-5-12.5 MG PO TABS: 0.5000 | ORAL_TABLET | Freq: Every day | ORAL | 0 refills | Status: DC

## 2019-12-09 MED ORDER — ROSUVASTATIN CALCIUM 5 MG PO TABS: 2.5000 mg | ORAL_TABLET | Freq: Every day | ORAL | 0 refills | Status: DC

## 2019-12-09 MED ORDER — VITAMIN D (ERGOCALCIFEROL) 1.25 MG (50000 UNIT) PO CAPS: ORAL_CAPSULE | ORAL | 0 refills | Status: DC

## 2019-12-09 MED FILL — ROSUVASTATIN CALCIUM 5 MG T: 5 | 30 days supply | Qty: 15 | Fill #0

## 2019-12-09 MED FILL — VIT D2 1.25 MG (50,000 UNIT: 1.25 MG | 4 days supply | Qty: 4 | Fill #0

## 2019-12-09 MED FILL — OLMSRTN-AMLDPN-HCTZ 20-5-12: 20-5-12.5 | 30 days supply | Qty: 15 | Fill #0

## 2019-12-09 NOTE — Telephone Encounter (Signed)
Patient states, Dr. Jenetta Downer changed her medication and gave her some new prescriptions at her visit but the drug store hasn't received the prescriptions.  Please let her know when the prescriptions are sent.

## 2019-12-09 NOTE — Patient Instructions (Signed)
The 10-year ASCVD risk score Mikey Bussing DC Brooke Bonito., et al., 2013) is: 7.2%   Values used to calculate the score:     Age: 44 years     Sex: Female     Is Non-Hispanic African American: Yes     Diabetic: No     Tobacco smoker: No     Systolic Blood Pressure: 897 mmHg     Is BP treated: Yes     HDL Cholesterol: 51 mg/dL     Total Cholesterol: 180 mg/dL

## 2019-12-13 ENCOUNTER — Encounter (INDEPENDENT_AMBULATORY_CARE_PROVIDER_SITE_OTHER): Payer: Self-pay | Admitting: Family Medicine

## 2019-12-13 MED ORDER — LEVOTHYROXINE SODIUM 25 MCG PO TABS: 12.5000 ug | ORAL_TABLET | Freq: Every day | ORAL | 0 refills | Status: DC

## 2019-12-13 NOTE — Progress Notes (Signed)
Chief Complaint:   OBESITY Angela [name] is here to discuss her progress with her obesity treatment plan along with follow-up of her obesity related diagnoses. Angela [name] is on the Category 1 Plan and states she is following her eating plan approximately 50% of the time. Angela [name] states she is walking for 60 minutes 1 times per week.  Today's visit was #: 2 Starting weight: 367 lbs Starting date: 11/25/2019 Today's weight: 360 lbs Today's date: 12/09/2019 Total lbs lost to date: 7 lbs Total lbs lost since last in-office visit: 7 lbs  Interim History: Angela [name] reports being under a lot of stress.  She has followed the plan 50% of the time, and says that the other 50%, she did not eat much, she just "nibbled here and there".  She nibbled on lunch meats.  Mom had a CVA on Monday, 4 days ago.  [name] took her to the ED with right arm weakness and loss of coordination.  This was her first follow-up office visit, and she is here to discuss labs as well.  She does not like to take mediations as she has a strong fear of side effects as she has seen in family members.  This is why she often does not take them at all.  Subjective:   1. Prediabetes Discussed labs with patient today.   [name] has a diagnosis of prediabetes based on her elevated HgA1c and was informed this puts her at greater risk of developing diabetes. She continues to work on diet and exercise to decrease her risk of diabetes. She denies nausea or hypoglycemia.  A1c was 5.7 in 08/2018, now is 5.8.  Lab Results  Component Value Date   HGBA1C 5.8 (H) 11/25/2019   Lab Results  Component Value Date   INSULIN 14.8 11/25/2019   2. Elevated TSH Discussed labs with patient today.  Angela [name] endorses fatigue, tiredness, and achiness.  She has difficulty losing weight, historically.     Lab Results  Component Value Date   TSH 5.320 (H) 11/25/2019   3. Essential hypertension Worsening.  Discussed labs with patient today.  Blood  pressure has been running too hight at home, 1695/109, 159/109, 169/110, 189/131, 207/120, 169/116 with pulse of 70-80s.  PCP had her on Coreg, HCTZ, and amlodipine, but she stopped the calcium blocker on her own because it was too big.  BP Readings from Last 3 Encounters:  12/09/19 (!) 160/83  11/25/19 (!) 182/105  08/20/18 (!) 136/99   4. Other hyperlipidemia New, worsening.  Discussed labs with patient today.  Elevated LDL with 10 year risk of 7.2% and strong family history of hyperlipidemia with CVA and AMI.  Lab Results  Component Value Date   ALT 11 11/25/2019   AST 11 11/25/2019   ALKPHOS 97 11/25/2019   BILITOT 0.3 11/25/2019   Lab Results  Component Value Date   CHOL 180 11/25/2019   HDL 51 11/25/2019   LDLCALC 115 (H) 11/25/2019   TRIG 74 11/25/2019   CHOLHDL 3.5 11/25/2019   5. Vitamin D deficiency Discussed labs with patient today.  Angela [name]'s Vitamin D level was 9.4 on 11/25/2019. She is currently taking no vitamin D supplement. She denies nausea, vomiting or muscle weakness.  She states she is achy all over and endorses fatigue.  Vitamin D level is very low.  6. Elevated serum creatinine Discussed labs with patient today.  This is likely secondary to poorly controlled blood pressure for so long.  Lab Results  Component Value Date  CREATININE 1.15 (H) 11/25/2019      Assessment/Plan:   1. Prediabetes - discussed lab results with pt today, extensive education provided on condition Jelisha will continue to work on weight loss, exercise, and decreasing simple carbohydrates to help decrease the risk of diabetes.  Recheck lab in 3 months (A1c, fasting insulin), continue prudent nutritional plan, weight loss.  Long discussion with Tanara regarding insulin resistance and ways to avoid onset of diabetes mellitus.   2. Elevated TSH Angela [name] will start Synthroid 25 mcg daily, 1/2 tablet daily due to sx of fatigue/ tiredness etc.   Will recheck levels (TSH, T3, T4)  in 6-8 weeks - levothyroxine (SYNTHROID) 25 MCG tablet; Take 1/2 tablet by mouth daily  Dispense: 30 tablet; Refill: 0   3. Essential hypertension-  poorly controlled and worsening Angela [name] is working on healthy weight loss and exercise to improve blood pressure control.   She was being managed by her PCP but her BP has been poorly controlled for some time now. We will watch for signs of hypotension as she continues her lifestyle modifications.   Angela [name] will discontinue HCTZ and continue Coreg.   - She will start olmesartan-amlodipine-HCTZ 20-5-12.5.  She will start at only 1/2 tablet per day.   Check blood pressure and heart rate daily and log and bring log into next office visit.  Goal is less than 130/80, and she will slowly get there.   -->  Pt knows it is very important she follow-up with her PCP re: long term HTN mgt.  Pt is aware we will start meds to gain better BP control, but she understands this should be mgt by her primary provider in the future.     4. Other hyperlipidemia Cardiovascular risk and specific lipid/LDL goals reviewed.  We discussed several lifestyle modifications today and Madelyn will continue to work on diet, exercise and weight loss efforts. Orders and follow up as documented in patient record.   - Start Crestor 5 mg.  Take 1/2 tablet at bedtime.   --->  Follow-up with PCP for management.  Recheck FLP and CMP in 6-8 weeks.  Counseling Intensive lifestyle modifications are the first line treatment for this issue.  Dietary changes: Increase soluble fiber. Decrease simple carbohydrates.  Exercise changes: Moderate to vigorous-intensity aerobic activity 150 minutes per week if tolerated.  Lipid-lowering medications: see documented in medical record.  5. Vitamin D deficiency Low Vitamin D level contributes to fatigue and are associated with obesity, breast, and colon cancer. She agrees to start to take prescription Vitamin D @50 ,000 IU every week and will  follow-up for routine testing of Vitamin D, at least 2-3 times per year to avoid over-replacement.  Recheck levels in 2-3 months.  6. Elevated serum creatinine Angela [name] needs to control her blood pressure, check daily.  Continue prudent nutritional plan, weight loss.   --->  Follow-up with PCP for chronic care.  We will monitor alongside them.   7. Class 3 severe obesity with serious comorbidity and body mass index (BMI) of 60.0 to 69.9 in adult, unspecified obesity type (HCC) Halei is currently in the action stage of change. As such, her goal is to continue with weight loss efforts. She has agreed to the Category 1 Plan.   I am starting [facility] on 4 new medications today.  She will need CMP, FLP, vitamin D, thyroid panel in 8 weeks.  I recommend she make a follow-up with her PCP for this.  Exercise goals: As is.  Behavioral  modification strategies: increasing lean protein intake, increasing vegetables, increasing water intake, no skipping meals and keeping healthy foods in the home.  Latia has agreed to follow-up with our clinic in 2 weeks. She was informed of the importance of frequent follow-up visits to maximize her success with intensive lifestyle modifications for her multiple health conditions.    Objective:   Blood pressure (!) 160/83, pulse 80, temperature 98.6 F (37 C), temperature source Oral, height 5\' 1"  (1.549 m), weight (!) 360 lb (163.3 kg), SpO2 96 %. Body mass index is 68.02 kg/m.  General: Cooperative, alert, well developed, in no acute distress. HEENT: Conjunctivae and lids unremarkable. Cardiovascular: Regular rhythm.  Lungs: Normal work of breathing. Neurologic: No focal deficits.   Lab Results  Component Value Date   CREATININE 1.15 (H) 11/25/2019   BUN 16 11/25/2019   NA 141 11/25/2019   K 4.2 11/25/2019   CL 104 11/25/2019   CO2 25 11/25/2019   Lab Results  Component Value Date   ALT 11 11/25/2019   AST 11 11/25/2019   ALKPHOS 97  11/25/2019   BILITOT 0.3 11/25/2019   Lab Results  Component Value Date   HGBA1C 5.8 (H) 11/25/2019   HGBA1C 5.7 (H) 08/20/2018   HGBA1C 5.5 08/06/2016   Lab Results  Component Value Date   INSULIN 14.8 11/25/2019   Lab Results  Component Value Date   TSH 5.320 (H) 11/25/2019   Lab Results  Component Value Date   CHOL 180 11/25/2019   HDL 51 11/25/2019   LDLCALC 115 (H) 11/25/2019   TRIG 74 11/25/2019   CHOLHDL 3.5 11/25/2019   Lab Results  Component Value Date   WBC 8.0 11/25/2019   HGB 13.0 11/25/2019   HCT 40.5 11/25/2019   MCV 85 11/25/2019   PLT 312 11/25/2019   Attestation Statements:   Reviewed by clinician on day of visit: allergies, medications, problem list, medical history, surgical history, family history, social history, and previous encounter notes.  Time spent on visit including pre-visit chart review and post-visit care and charting was 55 minutes.   I, Water quality scientist, CMA, am acting as Location manager for [employer], DO.  I have reviewed the above documentation for accuracy and completeness, and I agree with the above. -  Mellody Dance, DO

## 2019-12-13 NOTE — Telephone Encounter (Signed)
Please advise on the thyroid med.  Thanks

## 2019-12-14 MED ORDER — LEVOTHYROXINE SODIUM 25 MCG PO TABS: ORAL_TABLET | ORAL | 0 refills | Status: DC

## 2019-12-14 MED FILL — LEVOTHYROXINE SODIUM 25 MCG: 25 | 30 days supply | Qty: 15 | Fill #0

## 2019-12-21 ENCOUNTER — Other Ambulatory Visit: Payer: Self-pay

## 2019-12-21 ENCOUNTER — Telehealth (INDEPENDENT_AMBULATORY_CARE_PROVIDER_SITE_OTHER): Payer: Medicaid Other | Admitting: Psychology

## 2019-12-21 DIAGNOSIS — F331 Major depressive disorder, recurrent, moderate: Secondary | ICD-10-CM

## 2019-12-22 NOTE — Progress Notes (Unsigned)
Office: [phone]  /  Fax: [phone]    Date: January 05, 2020   Appointment Start Time: *** Duration: *** minutes Provider: Glennie Isle, Psy.D. Type of Session: Individual Therapy  Location of Patient: {gbptloc:23249} Location of Provider: {Location of Service:22491} Type of Contact: Telepsychological Visit via MyChart Video Visit  Session Content: Angela [name] is a 44 y.o. female presenting via [place] Visit for a follow-up appointment to address the previously established treatment goal of increasing coping skills. Today's appointment was a telepsychological visit due to COVID-19. Angela [name] provided verbal consent for today's telepsychological appointment and she is aware she is responsible for securing confidentiality on her end of the session. Prior to proceeding with today's appointment, Angela [name]'s physical location at the time of this appointment was obtained as well a phone number she could be reached at in the event of technical difficulties. Angela [name] and this provider participated in today's telepsychological service.   This provider conducted a brief check-in and verbally administered the PHQ-9 and GAD-7. *** [name] was receptive to today's appointment as evidenced by openness to sharing, responsiveness to feedback, and {gbreceptiveness:23401}.  Mental Status Examination:  Appearance: {Appearance:22431} Behavior: {Behavior:22445} Mood: {gbmood:21757} Affect: {Affect:22436} Speech: {Speech:22432} Eye Contact: {Eye Contact:22433} Psychomotor Activity: {Motor Activity:22434} Gait: {gbgait:23404} Thought Process: {thought process:22448}  Thought Content/Perception: {disturbances:22451} Orientation: {Orientation:22437} Memory/Concentration: {gbcognition:22449} Insight/Judgment: {Insight:22446}  Structured Assessments Results: The Patient Health Questionnaire-9 (PHQ-9) is a self-report measure that assesses symptoms and severity of depression over the course of the  last two weeks. Angela [name] obtained a score of *** suggesting {GBPHQ9SEVERITY:21752}. Angela [name] finds the endorsed symptoms to be {gbphq9difficulty:21754}. [0= Not at all; 1= Several days; 2= More than half the days; 3= Nearly every day] Little interest or pleasure in doing things ***  Feeling down, depressed, or hopeless ***  Trouble falling or staying asleep, or sleeping too much ***  Feeling tired or having little energy ***  Poor appetite or overeating ***  Feeling bad about yourself --- or that you are a failure or have let yourself or your family down ***  Trouble concentrating on things, such as reading the newspaper or watching television ***  Moving or speaking so slowly that other people could have noticed? Or the opposite --- being so fidgety or restless that you have been moving around a lot more than usual ***  Thoughts that you would be better off dead or hurting yourself in some way ***  PHQ-9 Score ***    The Generalized Anxiety Disorder-7 (GAD-7) is a brief self-report measure that assesses symptoms of anxiety over the course of the last two weeks. Angela [name] obtained a score of *** suggesting {gbgad7severity:21753}. Angela [name] finds the endorsed symptoms to be {gbphq9difficulty:21754}. [0= Not at all; 1= Several days; 2= Over half the days; 3= Nearly every day] Feeling nervous, anxious, on edge ***  Not being able to stop or control worrying ***  Worrying too much about different things ***  Trouble relaxing ***  Being so restless that it's hard to sit still ***  Becoming easily annoyed or irritable ***  Feeling afraid as if something awful might happen ***  GAD-7 Score ***   Interventions:  {Interventions for Progress Notes:23405}  DSM-5 Diagnosis(es): 296.32 (F33.1) Major Depressive Disorder, Recurrent Episode, Moderate, With Anxious Distress  Treatment Goal & Progress: During the initial appointment with this provider, the following treatment goal was established: increase  coping skills. [name] has demonstrated progress in her goal as evidenced by {gbtxprogress:22839}. Angela [name] also {gbtxprogress2:22951}.  Plan: The next appointment will  be scheduled in {gbweeks:21758}, which will be {gbtxmodality:23402}. The next session will focus on {Plan for Next Appointment:23400}.

## 2019-12-23 ENCOUNTER — Ambulatory Visit (INDEPENDENT_AMBULATORY_CARE_PROVIDER_SITE_OTHER): Payer: Medicaid Other | Admitting: Family Medicine

## 2019-12-23 ENCOUNTER — Encounter (INDEPENDENT_AMBULATORY_CARE_PROVIDER_SITE_OTHER): Payer: Self-pay | Admitting: Bariatrics

## 2019-12-23 ENCOUNTER — Encounter: Payer: Self-pay | Admitting: Internal Medicine

## 2019-12-23 ENCOUNTER — Other Ambulatory Visit: Payer: Self-pay

## 2019-12-23 ENCOUNTER — Ambulatory Visit (INDEPENDENT_AMBULATORY_CARE_PROVIDER_SITE_OTHER): Payer: Medicaid Other | Admitting: Bariatrics

## 2019-12-23 VITALS — BP 145/88 | HR 114 | Temp 98.1°F | Ht 61.0 in | Wt 360.0 lb

## 2019-12-23 DIAGNOSIS — R002 Palpitations: Secondary | ICD-10-CM

## 2019-12-23 DIAGNOSIS — Z6841 Body Mass Index (BMI) 40.0 and over, adult: Secondary | ICD-10-CM

## 2019-12-23 DIAGNOSIS — I10 Essential (primary) hypertension: Secondary | ICD-10-CM | POA: Diagnosis not present

## 2019-12-23 MED ORDER — HYDROCHLOROTHIAZIDE 25 MG PO TABS: 25.0000 mg | ORAL_TABLET | Freq: Every day | ORAL | 0 refills | Status: DC

## 2019-12-23 MED ORDER — CARVEDILOL 25 MG PO TABS
25.0000 mg | ORAL_TABLET | Freq: Two times a day (BID) | ORAL | 0 refills | Status: DC
Start: 2019-12-23 — End: 2020-01-06

## 2019-12-27 ENCOUNTER — Encounter (INDEPENDENT_AMBULATORY_CARE_PROVIDER_SITE_OTHER): Payer: Self-pay | Admitting: Bariatrics

## 2019-12-27 NOTE — Progress Notes (Signed)
Chief Complaint:   Angela [name] is here to discuss her progress with her obesity treatment plan along with follow-up of her obesity related diagnoses. Angela [name] is on the Category 1 Plan and states she is following her eating plan approximately 50% of the time.  Today's visit was #: 3 Starting weight: 367 lbs Starting date: 11/25/2019 Today's weight: 361 lbs Today's date: 12/23/2019 Total lbs lost to date: 6 Total lbs lost since last in-office visit: 0  Interim History: Angela [name] is up 1 lbs. She has not felt good today and stopped her blood pressure medication.  Subjective:   Essential hypertension. Angela [name] is taking blood pressure medication, however, did not take olmesartan, amlodipine-HCTZ today and reports feeling dizzy when you did take the medication so she stopped yesterday. She reports taking Coreg consistently. She denies chest pain. She had taken Benicar, HCTZ, and Coreg in the past and had tried amlodipine but states that it was not working and stopped the medication. She started on olmesartan/amlodipine-HCTZ about 1 week ago, but stopped due to not feeling right. She reports continuing to take Coreg. EKG shows no acute changes when compared to an earlier EKG.  Blood pressure reading per patient at home.   Blood pressure 12/14/2019 161/107 Blood pressure 12/12/2019 174/109 Blood pressure 12/11/2019 179/112 Blood pressure 12/10/2019 169/110.  BP Readings from Last 3 Encounters:  12/23/19 (!) 145/88  12/09/19 (!) 160/83  11/25/19 (!) 182/105   Lab Results  Component Value Date   CREATININE 1.15 (H) 11/25/2019   CREATININE 1.04 (H) 07/26/2018   CREATININE 0.99 04/02/2018   Palpitations. Angela [name] denies chest pain. She was taking new medications olmesartan/amlodipine and HCTZ.She states that she has not had palpitations since she stopped the medication yesterday. She denies palpitations today, and states that she did not have palpitations until she  stated the combination blood pressure medication above.   Assessment/Plan:   Essential hypertension.  Palpitations. Angela [name] will stop olmesartan/amlodipine-HCTZ. She will increase  Coreg from 3.125 mg BID to the new dose below.  New prescriptions were given for Coreg 25 mg 1 PO BID #60 with 0 refills and HCTZ 25 mg 1 PO daily #30 with 0 refills and will continue all other medications. She will go to the ER with chest pain, increased palpitations, or shortness of breath. She will return to the office in 1 week with Dr. Raliegh Scarlet who started her on the olmesartan/amlodipine/HCTZ combination.   Class 3 severe obesity with serious comorbidity and body mass index (BMI) of 60.0 to 69.9 in adult, unspecified obesity type ([place]).  Angela [name] is currently in the action stage of change. As such, her goal is to continue with weight loss efforts. She has agreed to the Category 1 Plan.   She will work on meal planning and intentional eating.   Exercise goals: All adults should avoid inactivity. Some physical activity is better than none, and adults who participate in any amount of physical activity gain some health benefits.  Behavioral modification strategies: increasing lean protein intake, decreasing simple carbohydrates, increasing vegetables, increasing water intake, decreasing eating out, no skipping meals, meal planning and cooking strategies, keeping healthy foods in the home and planning for success.  Angela [name] has agreed to follow-up with our clinic in 2-3 weeks. She was informed of the importance of frequent follow-up visits to maximize her success with intensive lifestyle modifications for her multiple health conditions.   Objective:   Blood pressure (!) 145/88, pulse (!) 114, temperature 98.1 F (36.7 C),  last menstrual period 12/03/2019, SpO2 94 %. There is no height or weight on file to calculate BMI.  General: Cooperative, alert, well developed, in no acute distress. HEENT: Conjunctivae and  lids unremarkable. Cardiovascular: Regular rhythm.  Lungs: Normal work of breathing. Neurologic: No focal deficits.   Lab Results  Component Value Date   CREATININE 1.15 (H) 11/25/2019   BUN 16 11/25/2019   NA 141 11/25/2019   K 4.2 11/25/2019   CL 104 11/25/2019   CO2 25 11/25/2019   Lab Results  Component Value Date   ALT 11 11/25/2019   AST 11 11/25/2019   ALKPHOS 97 11/25/2019   BILITOT 0.3 11/25/2019   Lab Results  Component Value Date   HGBA1C 5.8 (H) 11/25/2019   HGBA1C 5.7 (H) 08/20/2018   HGBA1C 5.5 08/06/2016   Lab Results  Component Value Date   INSULIN 14.8 11/25/2019   Lab Results  Component Value Date   TSH 5.320 (H) 11/25/2019   Lab Results  Component Value Date   CHOL 180 11/25/2019   HDL 51 11/25/2019   LDLCALC 115 (H) 11/25/2019   TRIG 74 11/25/2019   CHOLHDL 3.5 11/25/2019   Lab Results  Component Value Date   WBC 8.0 11/25/2019   HGB 13.0 11/25/2019   HCT 40.5 11/25/2019   MCV 85 11/25/2019   PLT 312 11/25/2019   No results found for: IRON, TIBC, FERRITIN   EKG: No acute changes.   Attestation Statements:   Reviewed by clinician on day of visit: allergies, medications, problem list, medical history, surgical history, family history, social history, and previous encounter notes.  Time spent on visit including pre-visit chart review and post-visit charting and care was 30 minutes.   Migdalia Dk, am acting as Location manager for [employer], DO   I have reviewed the above documentation for accuracy and completeness, and I agree with the above. Jearld Lesch, DO

## 2019-12-28 ENCOUNTER — Encounter (HOSPITAL_COMMUNITY): Payer: Self-pay

## 2019-12-28 ENCOUNTER — Ambulatory Visit (HOSPITAL_COMMUNITY)
Admission: EM | Admit: 2019-12-28 | Discharge: 2019-12-28 | Disposition: A | Discharge status: Home / Self Care | Payer: Medicaid Other | Attending: Urgent Care | Admitting: Urgent Care

## 2019-12-28 ENCOUNTER — Ambulatory Visit (INDEPENDENT_AMBULATORY_CARE_PROVIDER_SITE_OTHER): Payer: Medicaid Other

## 2019-12-28 ENCOUNTER — Other Ambulatory Visit: Payer: Self-pay

## 2019-12-28 DIAGNOSIS — R079 Chest pain, unspecified: Secondary | ICD-10-CM | POA: Diagnosis not present

## 2019-12-28 DIAGNOSIS — Z79899 Other long term (current) drug therapy: Secondary | ICD-10-CM | POA: Insufficient documentation

## 2019-12-28 DIAGNOSIS — R05 Cough: Secondary | ICD-10-CM | POA: Diagnosis not present

## 2019-12-28 DIAGNOSIS — I517 Cardiomegaly: Secondary | ICD-10-CM | POA: Diagnosis not present

## 2019-12-28 DIAGNOSIS — Z20822 Contact with and (suspected) exposure to covid-19: Secondary | ICD-10-CM | POA: Diagnosis not present

## 2019-12-28 DIAGNOSIS — J90 Pleural effusion, not elsewhere classified: Secondary | ICD-10-CM | POA: Diagnosis not present

## 2019-12-28 DIAGNOSIS — R000 Tachycardia, unspecified: Secondary | ICD-10-CM | POA: Diagnosis not present

## 2019-12-28 DIAGNOSIS — I10 Essential (primary) hypertension: Secondary | ICD-10-CM | POA: Diagnosis not present

## 2019-12-28 DIAGNOSIS — Z7901 Long term (current) use of anticoagulants: Secondary | ICD-10-CM | POA: Diagnosis not present

## 2019-12-28 DIAGNOSIS — J4540 Moderate persistent asthma, uncomplicated: Secondary | ICD-10-CM | POA: Diagnosis not present

## 2019-12-28 DIAGNOSIS — Z791 Long term (current) use of non-steroidal anti-inflammatories (NSAID): Secondary | ICD-10-CM | POA: Diagnosis not present

## 2019-12-28 DIAGNOSIS — R1012 Left upper quadrant pain: Secondary | ICD-10-CM | POA: Diagnosis not present

## 2019-12-28 DIAGNOSIS — R059 Cough, unspecified: Secondary | ICD-10-CM

## 2019-12-28 LAB — SARS CORONAVIRUS 2 (TAT 6-24 HRS): SARS Coronavirus 2: NEGATIVE

## 2019-12-28 MED ORDER — PROMETHAZINE-DM 6.25-15 MG/5ML PO SYRP
5.0000 mL | ORAL_SOLUTION | Freq: Every evening | ORAL | 0 refills | Status: DC | PRN
Start: 2019-12-28 — End: 2020-01-06

## 2019-12-28 MED ORDER — CETIRIZINE HCL 10 MG PO TABS: 10.0000 mg | ORAL_TABLET | Freq: Every day | ORAL | 0 refills | Status: DC

## 2019-12-28 MED ORDER — BENZONATATE 100 MG PO CAPS: 100.0000 mg | ORAL_CAPSULE | Freq: Three times a day (TID) | ORAL | 0 refills | Status: DC | PRN

## 2019-12-28 MED ORDER — MONTELUKAST SODIUM 10 MG PO TABS: 10.0000 mg | ORAL_TABLET | Freq: Every day | ORAL | 0 refills | Status: DC

## 2019-12-28 NOTE — ED Triage Notes (Addendum)
Pt c/o LUQ abdominal pain worse with exertion. States last time she felt this way she had pneumonia. Pt has had cough x 4 days   Pt recently on Zpack for URI (2 weeks ago per patient)

## 2019-12-28 NOTE — ED Provider Notes (Signed)
[place]   MRN: [id number] DOB: 1975/06/10  Subjective:   Angela [name] is a 44 y.o. female presenting for 4-day history of recurrent cough, left-sided chest pain, upper abdominal pain worse with exertion.  Patient has a history of pneumonia and states that this feels exactly the same.  Has not been vaccinated for COVID-19.  Patient actually underwent a course of azithromycin and prednisone 2 weeks ago for an upper respiratory infection.  Has not had Covid vaccination.  Has a history of asthma as well.  Has been using her albuterol inhaler consistently for this.  No current facility-administered medications for this encounter.  Current Outpatient Medications:    acetaminophen (TYLENOL) 500 MG tablet, Take 1,000 mg by mouth every 6 (six) hours as needed for moderate pain or headache (headach). , Disp: , Rfl:    albuterol (PROVENTIL) (5 MG/ML) 0.5% nebulizer solution, Take 0.5 mLs (2.5 mg total) by nebulization every 6 (six) hours as needed for wheezing or shortness of breath., Disp: 20 mL, Rfl: 6   albuterol (VENTOLIN HFA) 108 (90 Base) MCG/ACT inhaler, Inhale 1-2 puffs into the lungs every 6 (six) hours as needed for wheezing or shortness of breath., Disp: 8 g, Rfl: 3   carvedilol (COREG) 25 MG tablet, Take 1 tablet (25 mg total) by mouth 2 (two) times daily with a meal., Disp: 60 tablet, Rfl: 0   gabapentin (NEURONTIN) 300 MG capsule, 1 tab PO daily QHS x 1 wk then BID, Disp: 60 capsule, Rfl: 3   hydrochlorothiazide (HYDRODIURIL) 25 MG tablet, Take 1 tablet (25 mg total) by mouth daily., Disp: 30 tablet, Rfl: 0   levothyroxine (SYNTHROID) 25 MCG tablet, Take 1/2 tablet by mouth daily, Disp: 30 tablet, Rfl: 0   levothyroxine (SYNTHROID) 25 MCG tablet, Take 0.5 tablets (12.5 mcg total) by mouth daily before breakfast., Disp: 30 tablet, Rfl: 0   meloxicam (MOBIC) 15 MG tablet, Take 1 tablet (15 mg total) by mouth daily., Disp: 30 tablet, Rfl: 6   rosuvastatin (CRESTOR) 5  MG tablet, Take 0.5 tablets (2.5 mg total) by mouth at bedtime., Disp: 30 tablet, Rfl: 0   Vitamin D, Ergocalciferol, (DRISDOL) 1.25 MG (50000 UNIT) CAPS capsule, Take one tablet wkly, Disp: 4 capsule, Rfl: 0   Allergies  Allergen Reactions   Doxycycline Nausea And Vomiting   Flagyl [Metronidazole Hcl] Hives and Nausea And Vomiting    Past Medical History:  Diagnosis Date   Allergies    Asthma    Back pain    Depression    hx, doing ok now   Edema, lower extremity    GERD (gastroesophageal reflux disease)    Headache(784.0)    Heart murmur    Hypertension    on meds   IBS (irritable bowel syndrome)    Joint pain    Neuropathy    Obesity    Osteoarthritis    Ovarian cyst    Prediabetes    Preterm labor    Vitamin D deficiency      Past Surgical History:  Procedure Laterality Date   stye removal   1994   TUBAL LIGATION  1998    Family History  Problem Relation Age of Onset   Hyperlipidemia Mother    Hypertension Mother    Thyroid disease Mother    Obesity Mother    Cancer Son    Hypertension Other    Diabetes Other    Other Neg Hx     Social History   Tobacco Use  Smoking status: Never Smoker   Smokeless tobacco: Never Used  Vaping Use   Vaping Use: Never used  Substance Use Topics   Alcohol use: No   Drug use: No    ROS   Objective:   Vitals: BP (!) 164/114    Pulse (!) 109    Temp 98.8 F (37.1 C)    Resp 20    LMP 12/03/2019    SpO2 95%   Wt Readings from Last 3 Encounters:  12/23/19 (!) 360 lb (163.3 kg)  12/09/19 (!) 360 lb (163.3 kg)  11/25/19 (!) 367 lb (166.5 kg)   Temp Readings from Last 3 Encounters:  12/28/19 98.8 F (37.1 C)  12/23/19 98.1 F (36.7 C)  12/09/19 98.6 F (37 C) (Oral)   BP Readings from Last 3 Encounters:  12/28/19 (!) 164/114  12/23/19 (!) 145/88  12/09/19 (!) 160/83   Pulse Readings from Last 3 Encounters:  12/28/19 (!) 109  12/23/19 (!) 114  12/09/19 80    Pulse was 86 on recheck, pulse oximetry was 99%.  Physical Exam Constitutional:      General: She is not in acute distress.    Appearance: Normal appearance. She is well-developed. She is obese. She is not ill-appearing, toxic-appearing or diaphoretic.  HENT:     Head: Normocephalic and atraumatic.     Nose: Nose normal.     Mouth/Throat:     Mouth: Mucous membranes are moist.  Eyes:     Extraocular Movements: Extraocular movements intact.     Pupils: Pupils are equal, round, and reactive to light.  Cardiovascular:     Rate and Rhythm: Normal rate and regular rhythm.     Pulses: Normal pulses.     Heart sounds: Normal heart sounds. No murmur heard.  No friction rub. No gallop.   Pulmonary:     Effort: Pulmonary effort is normal. No respiratory distress.     Breath sounds: No stridor. Examination of the right-lower field reveals wheezing. Examination of the left-lower field reveals wheezing and rales. Wheezing and rales present. No rhonchi.  Skin:    General: Skin is warm and dry.     Findings: No rash.  Neurological:     Mental Status: She is alert and oriented to person, place, and time.  Psychiatric:        Mood and Affect: Mood normal.        Behavior: Behavior normal.        Thought Content: Thought content normal.    DG Chest 2 View  Result Date: 12/28/2019 CLINICAL DATA:  44 year old female with history of cough and left lower chest pain. EXAM: CHEST - 2 VIEW COMPARISON:  Chest x-ray 07/26/2018. FINDINGS: Lung volumes are normal. No consolidative airspace disease. No pleural effusions. No pneumothorax. No pulmonary nodule or mass noted. Pulmonary vasculature is normal. Heart size is mildly enlarged. Upper mediastinal contours are within normal limits. IMPRESSION: 1. No radiographic evidence of acute cardiopulmonary disease. 2. Mild cardiomegaly. Electronically Signed   By: Vinnie Langton M.D.   On: 12/28/2019 19:10    Assessment and Plan :   PDMP not reviewed this  encounter.  1. Left-sided chest pain   2. Left upper quadrant abdominal pain   3. Cough   4. Moderate persistent asthma, unspecified whether complicated   5. Tachycardia   6. Essential hypertension   7. Morbid obesity ([place])     Patient states that she recently had her blood pressure medication change and has not taken  her second dose for the day.  X-ray was negative, patient recently underwent a course of azithromycin and prednisone.  Therefore we will hold off on additional antibiotics and steroids.  Recommended conservative management, COVID-19 testing is pending. Counseled patient on potential for adverse effects with medications prescribed/recommended today, ER and return-to-clinic precautions discussed, patient verbalized understanding.    Jaynee Eagles, [place] 12/28/19 1954

## 2019-12-28 NOTE — Discharge Instructions (Signed)

## 2019-12-30 ENCOUNTER — Inpatient Hospital Stay (HOSPITAL_COMMUNITY)
Admission: EM | Admit: 2019-12-30 | Discharge: 2020-01-06 | DRG: 176 | Disposition: A | Discharge status: Home / Self Care | Payer: Medicaid Other | Attending: Family Medicine | Admitting: Family Medicine

## 2019-12-30 ENCOUNTER — Emergency Department (HOSPITAL_COMMUNITY): Payer: Medicaid Other

## 2019-12-30 ENCOUNTER — Encounter: Payer: Self-pay | Admitting: Internal Medicine

## 2019-12-30 ENCOUNTER — Other Ambulatory Visit: Payer: Self-pay

## 2019-12-30 ENCOUNTER — Encounter (HOSPITAL_COMMUNITY): Payer: Self-pay

## 2019-12-30 DIAGNOSIS — E559 Vitamin D deficiency, unspecified: Secondary | ICD-10-CM | POA: Diagnosis present

## 2019-12-30 DIAGNOSIS — N1832 Chronic kidney disease, stage 3b: Secondary | ICD-10-CM | POA: Diagnosis present

## 2019-12-30 DIAGNOSIS — N179 Acute kidney failure, unspecified: Secondary | ICD-10-CM | POA: Diagnosis not present

## 2019-12-30 DIAGNOSIS — R7303 Prediabetes: Secondary | ICD-10-CM | POA: Diagnosis present

## 2019-12-30 DIAGNOSIS — Z8249 Family history of ischemic heart disease and other diseases of the circulatory system: Secondary | ICD-10-CM

## 2019-12-30 DIAGNOSIS — Z79899 Other long term (current) drug therapy: Secondary | ICD-10-CM

## 2019-12-30 DIAGNOSIS — Z6841 Body Mass Index (BMI) 40.0 and over, adult: Secondary | ICD-10-CM | POA: Diagnosis not present

## 2019-12-30 DIAGNOSIS — Z79890 Hormone replacement therapy (postmenopausal): Secondary | ICD-10-CM

## 2019-12-30 DIAGNOSIS — Z419 Encounter for procedure for purposes other than remedying health state, unspecified: Secondary | ICD-10-CM | POA: Diagnosis not present

## 2019-12-30 DIAGNOSIS — T502X5A Adverse effect of carbonic-anhydrase inhibitors, benzothiadiazides and other diuretics, initial encounter: Secondary | ICD-10-CM | POA: Diagnosis present

## 2019-12-30 DIAGNOSIS — F329 Major depressive disorder, single episode, unspecified: Secondary | ICD-10-CM | POA: Diagnosis present

## 2019-12-30 DIAGNOSIS — I10 Essential (primary) hypertension: Secondary | ICD-10-CM | POA: Diagnosis not present

## 2019-12-30 DIAGNOSIS — K219 Gastro-esophageal reflux disease without esophagitis: Secondary | ICD-10-CM | POA: Diagnosis present

## 2019-12-30 DIAGNOSIS — I2699 Other pulmonary embolism without acute cor pulmonale: Secondary | ICD-10-CM | POA: Diagnosis not present

## 2019-12-30 DIAGNOSIS — Z8349 Family history of other endocrine, nutritional and metabolic diseases: Secondary | ICD-10-CM

## 2019-12-30 DIAGNOSIS — G8929 Other chronic pain: Secondary | ICD-10-CM | POA: Diagnosis present

## 2019-12-30 DIAGNOSIS — J4520 Mild intermittent asthma, uncomplicated: Secondary | ICD-10-CM | POA: Diagnosis not present

## 2019-12-30 DIAGNOSIS — E039 Hypothyroidism, unspecified: Secondary | ICD-10-CM

## 2019-12-30 DIAGNOSIS — Z83438 Family history of other disorder of lipoprotein metabolism and other lipidemia: Secondary | ICD-10-CM

## 2019-12-30 DIAGNOSIS — E785 Hyperlipidemia, unspecified: Secondary | ICD-10-CM | POA: Diagnosis present

## 2019-12-30 DIAGNOSIS — J90 Pleural effusion, not elsewhere classified: Secondary | ICD-10-CM | POA: Diagnosis not present

## 2019-12-30 DIAGNOSIS — I2609 Other pulmonary embolism with acute cor pulmonale: Secondary | ICD-10-CM | POA: Diagnosis not present

## 2019-12-30 DIAGNOSIS — I361 Nonrheumatic tricuspid (valve) insufficiency: Secondary | ICD-10-CM | POA: Diagnosis not present

## 2019-12-30 DIAGNOSIS — E876 Hypokalemia: Secondary | ICD-10-CM | POA: Diagnosis not present

## 2019-12-30 DIAGNOSIS — K580 Irritable bowel syndrome with diarrhea: Secondary | ICD-10-CM | POA: Diagnosis present

## 2019-12-30 DIAGNOSIS — Z888 Allergy status to other drugs, medicaments and biological substances status: Secondary | ICD-10-CM

## 2019-12-30 DIAGNOSIS — Z20822 Contact with and (suspected) exposure to covid-19: Secondary | ICD-10-CM | POA: Diagnosis not present

## 2019-12-30 DIAGNOSIS — J45909 Unspecified asthma, uncomplicated: Secondary | ICD-10-CM

## 2019-12-30 DIAGNOSIS — I129 Hypertensive chronic kidney disease with stage 1 through stage 4 chronic kidney disease, or unspecified chronic kidney disease: Secondary | ICD-10-CM | POA: Diagnosis present

## 2019-12-30 DIAGNOSIS — R519 Headache, unspecified: Secondary | ICD-10-CM | POA: Diagnosis present

## 2019-12-30 DIAGNOSIS — R7989 Other specified abnormal findings of blood chemistry: Secondary | ICD-10-CM

## 2019-12-30 DIAGNOSIS — Z833 Family history of diabetes mellitus: Secondary | ICD-10-CM | POA: Diagnosis not present

## 2019-12-30 DIAGNOSIS — Z832 Family history of diseases of the blood and blood-forming organs and certain disorders involving the immune mechanism: Secondary | ICD-10-CM | POA: Diagnosis not present

## 2019-12-30 DIAGNOSIS — I2602 Saddle embolus of pulmonary artery with acute cor pulmonale: Secondary | ICD-10-CM | POA: Diagnosis not present

## 2019-12-30 DIAGNOSIS — R0602 Shortness of breath: Secondary | ICD-10-CM | POA: Diagnosis not present

## 2019-12-30 LAB — CBC WITH DIFFERENTIAL/PLATELET
Abs Immature Granulocytes: 0.02 10*3/uL (ref 0.00–0.07)
Basophils Absolute: 0 10*3/uL (ref 0.0–0.1)
Basophils Relative: 0 %
Eosinophils Absolute: 0 10*3/uL (ref 0.0–0.5)
Eosinophils Relative: 0 %
HCT: 42.9 % (ref 36.0–46.0)
Hemoglobin: 13.5 g/dL (ref 12.0–15.0)
Immature Granulocytes: 0 %
Lymphocytes Relative: 27 %
Lymphs Abs: 2.1 10*3/uL (ref 0.7–4.0)
MCH: 27.6 pg (ref 26.0–34.0)
MCHC: 31.5 g/dL (ref 30.0–36.0)
MCV: 87.7 fL (ref 80.0–100.0)
Monocytes Absolute: 0.5 10*3/uL (ref 0.1–1.0)
Monocytes Relative: 6 %
Neutro Abs: 5.2 10*3/uL (ref 1.7–7.7)
Neutrophils Relative %: 67 %
Platelets: 232 10*3/uL (ref 150–400)
RBC: 4.89 MIL/uL (ref 3.87–5.11)
RDW: 13.9 % (ref 11.5–15.5)
WBC: 7.8 10*3/uL (ref 4.0–10.5)
nRBC: 0 % (ref 0.0–0.2)

## 2019-12-30 LAB — COMPREHENSIVE METABOLIC PANEL
ALT: 40 U/L (ref 0–44)
AST: 35 U/L (ref 15–41)
Albumin: 4 g/dL (ref 3.5–5.0)
Alkaline Phosphatase: 123 U/L (ref 38–126)
Anion gap: 9 (ref 5–15)
BUN: 26 mg/dL — ABNORMAL HIGH (ref 6–20)
CO2: 23 mmol/L (ref 22–32)
Calcium: 9.2 mg/dL (ref 8.9–10.3)
Chloride: 107 mmol/L (ref 98–111)
Creatinine, Ser: 1.69 mg/dL — ABNORMAL HIGH (ref 0.44–1.00)
GFR calc Af Amer: 41 mL/min — ABNORMAL LOW (ref >60)
GFR calc non Af Amer: 36 mL/min — ABNORMAL LOW (ref >60)
Glucose, Bld: 127 mg/dL — ABNORMAL HIGH (ref 70–99)
Potassium: 4.1 mmol/L (ref 3.5–5.1)
Sodium: 139 mmol/L (ref 135–145)
Total Bilirubin: 0.5 mg/dL (ref 0.3–1.2)
Total Protein: 8.2 g/dL — ABNORMAL HIGH (ref 6.5–8.1)

## 2019-12-30 LAB — BRAIN NATRIURETIC PEPTIDE: B Natriuretic Peptide: 1288.6 pg/mL — ABNORMAL HIGH (ref 0.0–100.0)

## 2019-12-30 LAB — TROPONIN I (HIGH SENSITIVITY): Troponin I (High Sensitivity): 31 ng/L — ABNORMAL HIGH (ref <18)

## 2019-12-30 MED ORDER — SODIUM CHLORIDE 0.9 % IV BOLUS
1000.0000 mL | Freq: Once | INTRAVENOUS | Status: AC
  Administered 2019-12-30: 1000 mL via INTRAVENOUS

## 2019-12-30 MED ORDER — HEPARIN (PORCINE) 25000 UT/250ML-% IV SOLN
1350.0000 [IU]/h | INTRAVENOUS | Status: DC
  Administered 2019-12-30: 1900 [IU]/h via INTRAVENOUS
  Administered 2019-12-31 – 2020-01-02 (×3): 1350 [IU]/h via INTRAVENOUS
  Filled 2019-12-30 (×4): qty 250

## 2019-12-30 MED ORDER — HEPARIN BOLUS VIA INFUSION
3000.0000 [IU] | Freq: Once | INTRAVENOUS | Status: AC
  Administered 2019-12-30: 3000 [IU] via INTRAVENOUS
  Filled 2019-12-30: qty 3000

## 2019-12-30 MED ORDER — IOHEXOL 350 MG/ML SOLN
100.0000 mL | Freq: Once | INTRAVENOUS | Status: AC | PRN
  Administered 2019-12-30: 75 mL via INTRAVENOUS

## 2019-12-30 NOTE — H&P (Signed)
History and Physical    Angela [name] VCB:[id number] DOB: 1975-07-23 DOA: 12/30/2019  PCP: Ladell Pier, MD Patient coming from: Home  Chief Complaint: Shortness of breath  HPI: Angela [name] is a 44 y.o. female with medical history significant of morbid obesity (BMI 68.02), asthma, hypertension, hypothyroidism, GERD, IBS, neuropathy, prediabetes presenting with a chief complaint of shortness of breath.  Patient states for the past 4 days she has been feeling very short of breath.  She is not coughing much.  Denies fevers.  States she had left-sided chest discomfort 2 days ago and went to an urgent care center at that time.  Denies chest pain or discomfort at present.  States her legs are always swollen.  Denies calf pain.  Denies history of blood clots.  Denies recent travel, surgery, or OCP use.  States her mother had a blood clot.  States she had some diarrhea today after drinking tea which usually upsets her stomach.  She is not having diarrhea otherwise.  Denies abdominal pain or vomiting.  No other complaints.  States she started going to a weight loss clinic last month and has already lost 7 pounds by making dietary changes.  ED Course: Afebrile.  Not tachycardic or hypotensive.  Blood pressure slightly elevated.  Not hypoxic.  CBC unremarkable.  BUN 26, creatinine 1.6.  Baseline creatinine 0.9.  High-sensitivity troponin 31.  BNP significantly elevated at 1288.  SARS-CoV-2 PCR test pending.  Chest x-ray showing no active cardiopulmonary disease.  CT angiogram chest showing an acute bilateral pulmonary embolus with large clot burden and evidence of right heart strain.  Emboli involve both main pulmonary arteries extending into all lobar and many segmental and subsegmental branches.  Straightening of the intraventricular septum with RV to LV ratio of 2.1.  Minimal contrast refluxes into the hepatic veins and IVC.  No pulmonary infarct.  ED provider discussed the case with Dr. Lucile Shutters  from critical care who did not feel that thrombolytic therapy was needed.  He recommended admission under medicine service.  Patient was started on IV heparin and given 1 L normal saline bolus.  Review of Systems:  All systems reviewed and apart from history of presenting illness, are negative.  Past Medical History:  Diagnosis Date  . Allergies   . Asthma   . Back pain   . Depression    hx, doing ok now  . Edema, lower extremity   . GERD (gastroesophageal reflux disease)   . Headache(784.0)   . Heart murmur   . Hypertension    on meds  . IBS (irritable bowel syndrome)   . Joint pain   . Neuropathy   . Obesity   . Osteoarthritis   . Ovarian cyst   . Prediabetes   . Preterm labor   . Vitamin D deficiency     Past Surgical History:  Procedure Laterality Date  . stye removal   1994  . TUBAL LIGATION  1998     reports that she has never smoked. She has never used smokeless tobacco. She reports that she does not drink alcohol and does not use drugs.  Allergies  Allergen Reactions  . Doxycycline Nausea And Vomiting  . Flagyl [Metronidazole Hcl] Hives and Nausea And Vomiting    Family History  Problem Relation Age of Onset  . Hyperlipidemia Mother   . Hypertension Mother   . Thyroid disease Mother   . Obesity Mother   . Clotting disorder Mother   . Cancer Son   .  Hypertension Other   . Diabetes Other   . Other Neg Hx     Prior to Admission medications   Medication Sig Start Date End Date Taking? Authorizing Provider  acetaminophen (TYLENOL) 500 MG tablet Take 1,000 mg by mouth every 6 (six) hours as needed for moderate pain or headache (headach).     [provider]  albuterol (PROVENTIL) (5 MG/ML) 0.5% nebulizer solution Take 0.5 mLs (2.5 mg total) by nebulization every 6 (six) hours as needed for wheezing or shortness of breath. 12/02/19   Ladell Pier, MD  albuterol (VENTOLIN HFA) 108 (90 Base) MCG/ACT inhaler Inhale 1-2 puffs into the lungs every  6 (six) hours as needed for wheezing or shortness of breath. 12/02/19   Ladell Pier, MD  benzonatate (TESSALON) 100 MG capsule Take 1-2 capsules (100-200 mg total) by mouth 3 (three) times daily as needed. 12/28/19   Jaynee Eagles, PA-C  carvedilol (COREG) 25 MG tablet Take 1 tablet (25 mg total) by mouth 2 (two) times daily with a meal. 12/23/19   Jearld Lesch A, DO  cetirizine (ZYRTEC ALLERGY) 10 MG tablet Take 1 tablet (10 mg total) by mouth daily. 12/28/19   Jaynee Eagles, PA-C  gabapentin (NEURONTIN) 300 MG capsule 1 tab PO daily QHS x 1 wk then BID 07/07/18   Ladell Pier, MD  hydrochlorothiazide (HYDRODIURIL) 25 MG tablet Take 1 tablet (25 mg total) by mouth daily. 12/23/19   Georgia Lopes, DO  levothyroxine (SYNTHROID) 25 MCG tablet Take 1/2 tablet by mouth daily 12/14/19   Mellody Dance, DO  levothyroxine (SYNTHROID) 25 MCG tablet Take 0.5 tablets (12.5 mcg total) by mouth daily before breakfast. 12/13/19   Opalski, Neoma Laming, DO  meloxicam (MOBIC) 15 MG tablet Take 1 tablet (15 mg total) by mouth daily. 03/29/19   Ladell Pier, MD  montelukast (SINGULAIR) 10 MG tablet Take 1 tablet (10 mg total) by mouth at bedtime. 12/28/19   Jaynee Eagles, PA-C  promethazine-dextromethorphan (PROMETHAZINE-DM) 6.25-15 MG/5ML syrup Take 5 mLs by mouth at bedtime as needed for cough. 12/28/19   Jaynee Eagles, PA-C  rosuvastatin (CRESTOR) 5 MG tablet Take 0.5 tablets (2.5 mg total) by mouth at bedtime. 12/09/19   Mellody Dance, DO  Vitamin D, Ergocalciferol, (DRISDOL) 1.25 MG (50000 UNIT) CAPS capsule Take one tablet wkly 12/09/19   Mellody Dance, DO    Physical Exam: Vitals:   12/30/19 2028 12/30/19 2234 12/30/19 2336 12/31/19 0015  BP: (!) 164/122 (!) 178/119 (!) 152/107 (!) 155/111  Pulse: 98 90    Resp: 20 (!) 11    Temp: 97.6 F (36.4 C)     TempSrc: Oral     SpO2: 98% 98%    Weight: (!) 163.3 kg     Height: 5\' 1"  (1.549 m)       Physical Exam Constitutional:      General: She is not in  acute distress.    Appearance: She is obese.  HENT:     Head: Normocephalic and atraumatic.  Eyes:     Extraocular Movements: Extraocular movements intact.     Conjunctiva/sclera: Conjunctivae normal.  Cardiovascular:     Rate and Rhythm: Normal rate and regular rhythm.     Pulses: Normal pulses.  Pulmonary:     Effort: Pulmonary effort is normal. No respiratory distress.     Breath sounds: Normal breath sounds. No wheezing or rales.     Comments: Satting 98-100% on room air Abdominal:     General: Bowel  sounds are normal. There is no distension.     Palpations: Abdomen is soft.     Tenderness: There is no abdominal tenderness. There is no guarding.  Musculoskeletal:        General: Swelling present. No deformity.     Cervical back: Normal range of motion and neck supple.     Comments: Bilateral pedal edema  Skin:    General: Skin is warm and dry.  Neurological:     General: No focal deficit present.     Mental Status: She is alert and oriented to person, place, and time.     Labs on Admission: I have personally reviewed following labs and imaging studies  CBC: Recent Labs  Lab 12/30/19 2034  WBC 7.8  NEUTROABS 5.2  HGB 13.5  HCT 42.9  MCV 87.7  PLT 448   Basic Metabolic Panel: Recent Labs  Lab 12/30/19 2034  NA 139  K 4.1  CL 107  CO2 23  GLUCOSE 127*  BUN 26*  CREATININE 1.69*  CALCIUM 9.2   GFR: Estimated Creatinine Clearance: 61.7 mL/min (A) (by C-G formula based on SCr of 1.69 mg/dL (H)). Liver Function Tests: Recent Labs  Lab 12/30/19 2034  AST 35  ALT 40  ALKPHOS 123  BILITOT 0.5  PROT 8.2*  ALBUMIN 4.0   No results for input(s): LIPASE, AMYLASE in the last 168 hours. No results for input(s): AMMONIA in the last 168 hours. Coagulation Profile: No results for input(s): INR, PROTIME in the last 168 hours. Cardiac Enzymes: No results for input(s): CKTOTAL, CKMB, CKMBINDEX, TROPONINI in the last 168 hours. BNP (last 3 results) No results  for input(s): PROBNP in the last 8760 hours. HbA1C: No results for input(s): HGBA1C in the last 72 hours. CBG: No results for input(s): GLUCAP in the last 168 hours. Lipid Profile: No results for input(s): CHOL, HDL, LDLCALC, TRIG, CHOLHDL, LDLDIRECT in the last 72 hours. Thyroid Function Tests: No results for input(s): TSH, T4TOTAL, FREET4, T3FREE, THYROIDAB in the last 72 hours. Anemia Panel: No results for input(s): VITAMINB12, FOLATE, FERRITIN, TIBC, IRON, RETICCTPCT in the last 72 hours. Urine analysis:    Component Value Date/Time   COLORURINE YELLOW 07/27/2018 0100   APPEARANCEUR CLOUDY (A) 07/27/2018 0100   LABSPEC 1.021 07/27/2018 0100   PHURINE 5.0 07/27/2018 0100   GLUCOSEU NEGATIVE 07/27/2018 0100   HGBUR NEGATIVE 07/27/2018 0100   BILIRUBINUR NEGATIVE 07/27/2018 0100   KETONESUR NEGATIVE 07/27/2018 0100   PROTEINUR NEGATIVE 07/27/2018 0100   UROBILINOGEN 0.2 08/06/2016 1134   NITRITE NEGATIVE 07/27/2018 0100   LEUKOCYTESUR TRACE (A) 07/27/2018 0100    Radiological Exams on Admission: DG Chest 2 View  Result Date: 12/30/2019 CLINICAL DATA:  Shortness of breath. EXAM: CHEST - 2 VIEW COMPARISON:  December 28, 2019. FINDINGS: Stable cardiomediastinal silhouette. No pneumothorax or pleural effusion is noted. Both lungs are clear. The visualized skeletal structures are unremarkable. IMPRESSION: No active cardiopulmonary disease. Electronically Signed   By: Marijo Conception M.D.   On: 12/30/2019 20:50   CT Angio Chest PE W and/or Wo Contrast  Result Date: 12/30/2019 CLINICAL DATA:  Shortness of breath for 4 days. EXAM: CT ANGIOGRAPHY CHEST WITH CONTRAST TECHNIQUE: Multidetector CT imaging of the chest was performed using the standard protocol during bolus administration of intravenous contrast. Multiplanar CT image reconstructions and MIPs were obtained to evaluate the vascular anatomy. CONTRAST:  53mL OMNIPAQUE IOHEXOL 350 MG/ML SOLN COMPARISON:  Radiograph earlier this day.  FINDINGS: Cardiovascular: Examination is positive for  acute pulmonary embolus with filling defects within the bilateral main pulmonary arteries extending into all lobar, many segmental and subsegmental branches. Thromboembolic burden is large. There is straightening of the intraventricular septum with RV to LV ratio of 2.1. Minimal contrast refluxes into the hepatic veins and IVC. The thoracic aorta is normal in caliber no evidence of aortic dissection. Left vertebral artery arises directly from the thoracic aorta. No pericardial effusion. Mediastinum/Nodes: No enlarged mediastinal or hilar lymph nodes. Decompressed esophagus. No obvious thyroid nodule. Lungs/Pleura: No focal airspace disease. No evidence of pulmonary infarct. No pleural fluid. No findings of pulmonary edema. Upper Abdomen: Prominent liver partially included. Musculoskeletal: There are no acute or suspicious osseous abnormalities. Review of the MIP images confirms the above findings. IMPRESSION: 1. Examination is positive for acute bilateral pulmonary embolus with large clot burden and evidence of right heart strain. Emboli involve both main pulmonary arteries extending into all lobar and many segmental and subsegmental branches. Straightening of the intraventricular septum with RV to LV ratio of 2.1. Minimal contrast refluxes into the hepatic veins and IVC. 2. No pulmonary infarct. Critical Value/emergent results were called by telephone at the time of interpretation on 12/30/2019 at 11:00 pm to Dr Veryl Speak , who verbally acknowledged these results. Electronically Signed   By: Keith Rake M.D.   On: 12/30/2019 23:01    EKG: Independently reviewed.  Sinus rhythm, LAFB, Q waves in anterior leads.  No significant change in comparison to prior tracing from 12/23/2019.  Assessment/Plan Principal Problem:   Acute pulmonary embolism (HCC) Active Problems:   HTN (hypertension), benign   AKI (acute kidney injury) ([place])   Asthma    Hypothyroidism   Acute bilateral pulmonary embolus with large clot burden and evidence of right heart strain: Likely risk factor is morbid obesity (BMI 68.02) and sedentary lifestyle due to chronic knee pain.  Also reports family history of blood clots.  Patient is hemodynamically stable-no tachycardia or hypotension.  Not hypoxic-satting 98-100% on room air and no tachypnea/increased work of breathin. High-sensitivity troponin 31. EKG without acute changes in comparison to prior tracing from 12/23/2019.  BNP significantly elevated at 1288. CT angiogram chest showing an acute bilateral pulmonary embolus with large clot burden and evidence of right heart strain.  Emboli involve both main pulmonary arteries extending into all lobar and many segmental and subsegmental branches.  Straightening of the intraventricular septum with RV to LV ratio of 2.1.  Minimal contrast refluxes into the hepatic veins and IVC.  No pulmonary infarct. -Critical care does not feel that thrombolytic therapy is needed -Continue IV heparin.  Monitor BNP and troponin every 4 hours.  Order echocardiogram and hypercoagulable panel.  Admit to the stepdown unit and monitor hemodynamics very closely.  Continuous pulse ox, supplemental oxygen as needed.  AKI: Possibly related to home diuretic use. BUN 26, creatinine 1.6.  Baseline creatinine 0.9. -Patient received 1 L fluid bolus in the ED.  Hold diuretic and repeat BMP in a.m.  Asthma: Stable.  No bronchospasm. -Continue Singulair, Zyrtec, and albuterol nebulizer as needed.  Hypertension: Systolic currently in 751W. -Continue home Coreg  Hyperlipidemia -Continue Crestor  Hypothyroidism -Continue Synthroid  Prediabetes/morbid obesity (BMI 68.02): A1c 5.8 on 11/25/2019. -Patient states she started going to weight loss clinic last month and has already lost 7 pounds by making dietary changes.  Continue to encourage lifestyle modification.  DVT prophylaxis: Heparin Code Status:  Full code Family Communication: No family available at this time. Disposition Plan: Status is: Inpatient  Remains inpatient appropriate because:IV treatments appropriate due to intensity of illness or inability to take PO and Inpatient level of care appropriate due to severity of illness   Dispo: The patient is from: Home              Anticipated d/c is to: Home              Anticipated d/c date is: 3 days              Patient currently is not medically stable to d/c.  The medical decision making on this patient was of high complexity and the patient is at high risk for clinical deterioration, therefore this is a level 3 visit.  Shela Leff MD [facility]  If 7PM-7AM, please contact night-coverage [email or web address]  12/31/2019, 12:41 AM

## 2019-12-30 NOTE — ED Triage Notes (Addendum)
Pt presents with shob x 4 days and complications with new blood pressure medication dosing. Sts she has been taking coreg 3.125 and it was increased to 25mg  and htcz.

## 2019-12-30 NOTE — Progress Notes (Signed)
ANTICOAGULATION CONSULT NOTE - Initial Consult  Pharmacy Consult for IV heparin Indication: pulmonary embolus  Allergies  Allergen Reactions  . Doxycycline Nausea And Vomiting  . Flagyl [Metronidazole Hcl] Hives and Nausea And Vomiting    Patient Measurements: Height: 5\' 1"  (154.9 cm) Weight: (!) 163.3 kg (360 lb) IBW/kg (Calculated) : 47.8 Heparin Dosing Weight: 91 kg  Vital Signs: Temp: 97.6 F (36.4 C) (07/29 2028) Temp Source: Oral (07/29 2028) BP: 178/119 (07/29 2234) Pulse Rate: 90 (07/29 2234)  Labs: Recent Labs    12/30/19 2034 12/30/19 2157  HGB 13.5  --   HCT 42.9  --   PLT 232  --   CREATININE 1.69*  --   TROPONINIHS  --  31*    Estimated Creatinine Clearance: 61.7 mL/min (A) (by C-G formula based on SCr of 1.69 mg/dL (H)).   Medical History: Past Medical History:  Diagnosis Date  . Allergies   . Asthma   . Back pain   . Depression    hx, doing ok now  . Edema, lower extremity   . GERD (gastroesophageal reflux disease)   . Headache(784.0)   . Heart murmur   . Hypertension    on meds  . IBS (irritable bowel syndrome)   . Joint pain   . Neuropathy   . Obesity   . Osteoarthritis   . Ovarian cyst   . Prediabetes   . Preterm labor   . Vitamin D deficiency     Medications:  Scheduled:  Infusions:   Assessment: 44 yo female with new PE to start IV heparin per Rx dosing. Not on anticoagulation PTA. Baseline labs already done.   Goal of Therapy:  Heparin level 0.3-0.7 units/ml Monitor platelets by anticoagulation protocol: Yes   Plan:   IV heparin 3000 unit bolus then  IV heparin rate of 1900 units/hr  Check heparin level 6 hours after start of IV heparin  Daily CBC  Kara Mead 12/30/2019,11:14 PM

## 2019-12-30 NOTE — ED Provider Notes (Signed)
[facility] DEPT Provider Note   CSN: [id number] Arrival date & time: 12/30/19  2019     History Chief Complaint  Patient presents with  . Shortness of Breath    Angela [name] is a 44 y.o. female.  Patient is a 44 year old female with history of morbid obesity, hypertension, GERD.  She presents today for evaluation of shortness of breath, left-sided chest discomfort, and fatigue.  She tells me that her medications were recently changed.  Her Coreg was apparently increased secondary to high heart rate.  She also describes becoming short of breath with ambulating short distances.  She is having some left-sided chest discomfort as well.  She denies any fevers, chills, or cough.  Patient was seen 2 days ago at urgent care and had chest x-ray, labs, and Covid test.  All of these were negative.  The history is provided by the patient.  Shortness of Breath Severity:  Moderate Onset quality:  Gradual Duration:  1 week Timing:  Constant Progression:  Worsening Chronicity:  New Relieved by:  Nothing Worsened by:  Nothing Ineffective treatments:  None tried Associated symptoms: no fever        Past Medical History:  Diagnosis Date  . Allergies   . Asthma   . Back pain   . Depression    hx, doing ok now  . Edema, lower extremity   . GERD (gastroesophageal reflux disease)   . Headache(784.0)   . Heart murmur   . Hypertension    on meds  . IBS (irritable bowel syndrome)   . Joint pain   . Neuropathy   . Obesity   . Osteoarthritis   . Ovarian cyst   . Prediabetes   . Preterm labor   . Vitamin D deficiency     Patient Active Problem List   Diagnosis Date Noted  . Essential hypertension 07/08/2018  . Morbid obesity ([place]) 07/08/2018  . Neuropathy of both feet 07/08/2018  . Primary osteoarthritis of both knees 04/02/2018  . Foot pain, right 04/02/2018  . 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 HTN (hypertension), benign 12/17/2016  .  Pain, pelvic, female 02/05/2012  . Fibroids 02/05/2012  . Abnormal uterine bleeding 02/05/2012    Past Surgical History:  Procedure Laterality Date  . stye removal   1994  . TUBAL LIGATION  1998     OB History    Gravida  7   Para  5   Term  3   Preterm  2   AB  2   Living  5     SAB  2   TAB  0   Ectopic  0   Multiple  0   Live Births  5           Family History  Problem Relation Age of Onset  . Hyperlipidemia Mother   . Hypertension Mother   . Thyroid disease Mother   . Obesity Mother   . Cancer Son   . Hypertension Other   . Diabetes Other   . Other Neg Hx     Social History   Tobacco Use  . Smoking status: Never Smoker  . Smokeless tobacco: Never Used  Vaping Use  . Vaping Use: Never used  Substance Use Topics  . Alcohol use: No  . Drug use: No    Home Medications Prior to Admission medications   Medication Sig Start Date End Date Taking? Authorizing Provider  acetaminophen (TYLENOL) 500 MG tablet Take 1,000  mg by mouth every 6 (six) hours as needed for moderate pain or headache (headach).     [provider]  albuterol (PROVENTIL) (5 MG/ML) 0.5% nebulizer solution Take 0.5 mLs (2.5 mg total) by nebulization every 6 (six) hours as needed for wheezing or shortness of breath. 12/02/19   Ladell Pier, MD  albuterol (VENTOLIN HFA) 108 (90 Base) MCG/ACT inhaler Inhale 1-2 puffs into the lungs every 6 (six) hours as needed for wheezing or shortness of breath. 12/02/19   Ladell Pier, MD  benzonatate (TESSALON) 100 MG capsule Take 1-2 capsules (100-200 mg total) by mouth 3 (three) times daily as needed. 12/28/19   Jaynee Eagles, PA-C  carvedilol (COREG) 25 MG tablet Take 1 tablet (25 mg total) by mouth 2 (two) times daily with a meal. 12/23/19   Jearld Lesch A, DO  cetirizine (ZYRTEC ALLERGY) 10 MG tablet Take 1 tablet (10 mg total) by mouth daily. 12/28/19   Jaynee Eagles, PA-C  gabapentin (NEURONTIN) 300 MG capsule 1 tab PO daily QHS x  1 wk then BID 07/07/18   Ladell Pier, MD  hydrochlorothiazide (HYDRODIURIL) 25 MG tablet Take 1 tablet (25 mg total) by mouth daily. 12/23/19   Georgia Lopes, DO  levothyroxine (SYNTHROID) 25 MCG tablet Take 1/2 tablet by mouth daily 12/14/19   Mellody Dance, DO  levothyroxine (SYNTHROID) 25 MCG tablet Take 0.5 tablets (12.5 mcg total) by mouth daily before breakfast. 12/13/19   Opalski, Neoma Laming, DO  meloxicam (MOBIC) 15 MG tablet Take 1 tablet (15 mg total) by mouth daily. 03/29/19   Ladell Pier, MD  montelukast (SINGULAIR) 10 MG tablet Take 1 tablet (10 mg total) by mouth at bedtime. 12/28/19   Jaynee Eagles, PA-C  promethazine-dextromethorphan (PROMETHAZINE-DM) 6.25-15 MG/5ML syrup Take 5 mLs by mouth at bedtime as needed for cough. 12/28/19   Jaynee Eagles, PA-C  rosuvastatin (CRESTOR) 5 MG tablet Take 0.5 tablets (2.5 mg total) by mouth at bedtime. 12/09/19   Mellody Dance, DO  Vitamin D, Ergocalciferol, (DRISDOL) 1.25 MG (50000 UNIT) CAPS capsule Take one tablet wkly 12/09/19   Opalski, Deborah, DO    Allergies    Doxycycline and Flagyl [metronidazole hcl]  Review of Systems   Review of Systems  Constitutional: Negative for fever.  Respiratory: Positive for shortness of breath.   All other systems reviewed and are negative.   Physical Exam Updated Vital Signs BP (!) 164/122 (BP Location: Right Wrist)   Pulse 98   Temp 97.6 F (36.4 C) (Oral)   Resp 20   Ht 5\' 1"  (1.549 m)   Wt (!) 163.3 kg   LMP 12/03/2019 (Exact Date)   SpO2 98%   BMI 68.02 kg/m   Physical Exam Vitals and nursing note reviewed.  Constitutional:      General: She is not in acute distress.    Appearance: She is well-developed. She is not diaphoretic.  HENT:     Head: Normocephalic and atraumatic.  Cardiovascular:     Rate and Rhythm: Normal rate and regular rhythm.     Heart sounds: No murmur heard.  No friction rub. No gallop.   Pulmonary:     Effort: Pulmonary effort is normal. No  respiratory distress.     Breath sounds: Normal breath sounds. No wheezing.  Abdominal:     General: Bowel sounds are normal. There is no distension.     Palpations: Abdomen is soft.     Tenderness: There is no abdominal tenderness.  Musculoskeletal:  General: Normal range of motion.     Cervical back: Normal range of motion and neck supple.     Right lower leg: No tenderness. No edema.     Left lower leg: No tenderness. No edema.  Skin:    General: Skin is warm and dry.  Neurological:     Mental Status: She is alert and oriented to person, place, and time.     ED Results / Procedures / Treatments   Labs (all labs ordered are listed, but only abnormal results are displayed) Labs Reviewed  COMPREHENSIVE METABOLIC PANEL - Abnormal; Notable for the following components:      Result Value   Glucose, Bld 127 (*)    BUN 26 (*)    Creatinine, Ser 1.69 (*)    Total Protein 8.2 (*)    GFR calc non Af Amer 36 (*)    GFR calc Af Amer 41 (*)    All other components within normal limits  CBC WITH DIFFERENTIAL/PLATELET  BRAIN NATRIURETIC PEPTIDE  TROPONIN I (HIGH SENSITIVITY)    EKG EKG Interpretation  Date/Time:  Thursday December 30 2019 20:42:31 EDT Ventricular Rate:  96 PR Interval:    QRS Duration: 95 QT Interval:  372 QTC Calculation: 471 R Axis:   -51 Text Interpretation: Sinus rhythm Probable left atrial enlargement LAD, consider LAFB or inferior infarct Low voltage, precordial leads Consider anterior infarct Confirmed by Veryl Speak [id number]) on 12/30/2019 9:06:10 PM   Radiology DG Chest 2 View  Result Date: 12/30/2019 CLINICAL DATA:  Shortness of breath. EXAM: CHEST - 2 VIEW COMPARISON:  December 28, 2019. FINDINGS: Stable cardiomediastinal silhouette. No pneumothorax or pleural effusion is noted. Both lungs are clear. The visualized skeletal structures are unremarkable. IMPRESSION: No active cardiopulmonary disease. Electronically Signed   By: Marijo Conception M.D.   On:  12/30/2019 20:50    Procedures Procedures (including critical care time)  Medications Ordered in ED Medications  sodium chloride 0.9 % bolus 1,000 mL (has no administration in time range)    ED Course  I have reviewed the triage vital signs and the nursing notes.  Pertinent labs & imaging results that were available during my care of the patient were reviewed by me and considered in my medical decision making (see chart for details).    MDM Rules/Calculators/A&P  Patient 44 year old female with above past medical history presenting with complaints of tachycardia, pleuritic chest pain, and dyspnea on exertion.  This did not improve with changes in her blood pressure medication and presents tonight for evaluation.  Work-up initiated including EKG, cardiac enzymes, BNP, and CTA of the chest.  Unfortunately this revealed bilateral pulmonary emboli with large clot burden and RV/LV ratio of 2.1.  This was discussed with Dr. Lucile Shutters from [facility] who does not feel as though she requires thrombolysis or admission to the ICU based on her hemodynamics.    I have spoken with Dr. Marlowe Sax who agrees to admit.  CRITICAL CARE Performed by: Veryl Speak Total critical care time: 45 minutes Critical care time was exclusive of separately billable procedures and treating other patients. Critical care was necessary to treat or prevent imminent or life-threatening deterioration. Critical care was time spent personally by me on the following activities: development of treatment plan with patient and/or surrogate as well as nursing, discussions with consultants, evaluation of patient's response to treatment, examination of patient, obtaining history from patient or surrogate, ordering and performing treatments and interventions, ordering and review of laboratory studies, ordering and  review of radiographic studies, pulse oximetry and re-evaluation of patient's condition.   Final Clinical Impression(s) / ED  Diagnoses Final diagnoses:  None    Rx / DC Orders ED Discharge Orders    None       Veryl Speak, MD 12/30/19 2342

## 2019-12-31 ENCOUNTER — Inpatient Hospital Stay (HOSPITAL_COMMUNITY): Payer: Medicaid Other

## 2019-12-31 ENCOUNTER — Encounter (HOSPITAL_COMMUNITY): Payer: Self-pay | Admitting: Internal Medicine

## 2019-12-31 DIAGNOSIS — E039 Hypothyroidism, unspecified: Secondary | ICD-10-CM

## 2019-12-31 DIAGNOSIS — I2609 Other pulmonary embolism with acute cor pulmonale: Secondary | ICD-10-CM

## 2019-12-31 DIAGNOSIS — I2699 Other pulmonary embolism without acute cor pulmonale: Secondary | ICD-10-CM

## 2019-12-31 DIAGNOSIS — J45909 Unspecified asthma, uncomplicated: Secondary | ICD-10-CM

## 2019-12-31 DIAGNOSIS — I2602 Saddle embolus of pulmonary artery with acute cor pulmonale: Secondary | ICD-10-CM

## 2019-12-31 DIAGNOSIS — J4520 Mild intermittent asthma, uncomplicated: Secondary | ICD-10-CM | POA: Diagnosis not present

## 2019-12-31 DIAGNOSIS — I10 Essential (primary) hypertension: Secondary | ICD-10-CM | POA: Diagnosis not present

## 2019-12-31 DIAGNOSIS — N179 Acute kidney failure, unspecified: Secondary | ICD-10-CM

## 2019-12-31 DIAGNOSIS — I361 Nonrheumatic tricuspid (valve) insufficiency: Secondary | ICD-10-CM

## 2019-12-31 LAB — HEPARIN LEVEL (UNFRACTIONATED)
Heparin Unfractionated: 0.69 IU/mL (ref 0.30–0.70)
Heparin Unfractionated: 0.97 IU/mL — ABNORMAL HIGH (ref 0.30–0.70)
Heparin Unfractionated: 0.6 IU/mL (ref 0.30–0.70)
Heparin Unfractionated: 0.35 IU/mL (ref 0.30–0.70)

## 2019-12-31 LAB — CBC
HCT: 39.2 % (ref 36.0–46.0)
Hemoglobin: 12.2 g/dL (ref 12.0–15.0)
MCH: 27.4 pg (ref 26.0–34.0)
MCHC: 31.1 g/dL (ref 30.0–36.0)
MCV: 88.1 fL (ref 80.0–100.0)
Platelets: 227 10*3/uL (ref 150–400)
RBC: 4.45 MIL/uL (ref 3.87–5.11)
RDW: 14.1 % (ref 11.5–15.5)
WBC: 9.1 10*3/uL (ref 4.0–10.5)
nRBC: 0 % (ref 0.0–0.2)

## 2019-12-31 LAB — SARS CORONAVIRUS 2 BY RT PCR (HOSPITAL ORDER, PERFORMED IN ~~LOC~~ HOSPITAL LAB): SARS Coronavirus 2: NEGATIVE

## 2019-12-31 LAB — BASIC METABOLIC PANEL
Anion gap: 10 (ref 5–15)
BUN: 25 mg/dL — ABNORMAL HIGH (ref 6–20)
CO2: 20 mmol/L — ABNORMAL LOW (ref 22–32)
Calcium: 8.7 mg/dL — ABNORMAL LOW (ref 8.9–10.3)
Chloride: 107 mmol/L (ref 98–111)
Creatinine, Ser: 1.61 mg/dL — ABNORMAL HIGH (ref 0.44–1.00)
GFR calc Af Amer: 44 mL/min — ABNORMAL LOW (ref >60)
GFR calc non Af Amer: 38 mL/min — ABNORMAL LOW (ref >60)
Glucose, Bld: 108 mg/dL — ABNORMAL HIGH (ref 70–99)
Potassium: 3.7 mmol/L (ref 3.5–5.1)
Sodium: 137 mmol/L (ref 135–145)

## 2019-12-31 LAB — HIV ANTIBODY (ROUTINE TESTING W REFLEX): HIV Screen 4th Generation wRfx: NONREACTIVE

## 2019-12-31 LAB — TROPONIN I (HIGH SENSITIVITY)
Troponin I (High Sensitivity): 38 ng/L — ABNORMAL HIGH (ref <18)
Troponin I (High Sensitivity): 31 ng/L — ABNORMAL HIGH (ref <18)
Troponin I (High Sensitivity): 27 ng/L — ABNORMAL HIGH (ref <18)

## 2019-12-31 LAB — ECHOCARDIOGRAM COMPLETE
Area-P 1/2: 2.93 cm2
Height: 61 in
S' Lateral: 2.2 cm
Weight: 5936.55 oz

## 2019-12-31 LAB — BRAIN NATRIURETIC PEPTIDE
B Natriuretic Peptide: 1109.8 pg/mL — ABNORMAL HIGH (ref 0.0–100.0)
B Natriuretic Peptide: 1164.3 pg/mL — ABNORMAL HIGH (ref 0.0–100.0)

## 2019-12-31 LAB — MRSA PCR SCREENING: MRSA by PCR: NEGATIVE

## 2019-12-31 LAB — ANTITHROMBIN III: AntiThromb III Func: 108 % (ref 75–120)

## 2019-12-31 MED ORDER — LORATADINE 10 MG PO TABS
10.0000 mg | ORAL_TABLET | Freq: Every day | ORAL | Status: DC
  Administered 2019-12-31: 10 mg via ORAL
  Filled 2019-12-31 (×5): qty 1

## 2019-12-31 MED ORDER — HYDROCHLOROTHIAZIDE 25 MG PO TABS
25.0000 mg | ORAL_TABLET | Freq: Every day | ORAL | Status: DC
  Filled 2019-12-31: qty 1

## 2019-12-31 MED ORDER — HYDRALAZINE HCL 20 MG/ML IJ SOLN
10.0000 mg | Freq: Four times a day (QID) | INTRAMUSCULAR | Status: DC | PRN
  Administered 2020-01-03: 10 mg via INTRAVENOUS
  Filled 2019-12-31 (×2): qty 1

## 2019-12-31 MED ORDER — FUROSEMIDE 20 MG PO TABS: 20.0000 mg | ORAL_TABLET | Freq: Every day | ORAL | Status: DC

## 2019-12-31 MED ORDER — ACETAMINOPHEN 650 MG RE SUPP: 650.0000 mg | Freq: Four times a day (QID) | RECTAL | Status: DC | PRN

## 2019-12-31 MED ORDER — MONTELUKAST SODIUM 10 MG PO TABS
10.0000 mg | ORAL_TABLET | Freq: Every day | ORAL | Status: DC
  Filled 2019-12-31 (×4): qty 1

## 2019-12-31 MED ORDER — GABAPENTIN 300 MG PO CAPS
300.0000 mg | ORAL_CAPSULE | Freq: Two times a day (BID) | ORAL | Status: DC
  Filled 2019-12-31 (×9): qty 1

## 2019-12-31 MED ORDER — ROSUVASTATIN CALCIUM 5 MG PO TABS
2.5000 mg | ORAL_TABLET | Freq: Every day | ORAL | Status: DC
  Filled 2019-12-31: qty 1
  Filled 2019-12-31: qty 0.5
  Filled 2019-12-31 (×2): qty 1

## 2019-12-31 MED ORDER — ALBUTEROL SULFATE (2.5 MG/3ML) 0.083% IN NEBU: 2.5000 mg | INHALATION_SOLUTION | Freq: Four times a day (QID) | RESPIRATORY_TRACT | Status: DC | PRN

## 2019-12-31 MED ORDER — CARVEDILOL 12.5 MG PO TABS
25.0000 mg | ORAL_TABLET | Freq: Two times a day (BID) | ORAL | Status: DC
  Filled 2019-12-31 (×3): qty 2

## 2019-12-31 MED ORDER — ACETAMINOPHEN 325 MG PO TABS
650.0000 mg | ORAL_TABLET | Freq: Four times a day (QID) | ORAL | Status: DC | PRN
  Administered 2020-01-03: 650 mg via ORAL
  Filled 2019-12-31: qty 2

## 2019-12-31 MED ORDER — CHLORHEXIDINE GLUCONATE CLOTH 2 % EX PADS
6.0000 | MEDICATED_PAD | Freq: Every day | CUTANEOUS | Status: DC
  Administered 2019-12-31 – 2020-01-01 (×2): 6 via TOPICAL

## 2019-12-31 MED ORDER — LEVOTHYROXINE SODIUM 25 MCG PO TABS
12.5000 ug | ORAL_TABLET | Freq: Every day | ORAL | Status: DC
  Filled 2019-12-31 (×4): qty 1

## 2019-12-31 NOTE — Progress Notes (Signed)
ANTICOAGULATION CONSULT NOTE - follow up  Pharmacy Consult for IV heparin Indication: pulmonary embolus  Allergies  Allergen Reactions  . Doxycycline Nausea And Vomiting  . Flagyl [Metronidazole Hcl] Hives and Nausea And Vomiting    Patient Measurements: Height: 5\' 1"  (154.9 cm) Weight: (!) 168.3 kg (371 lb 0.6 oz) IBW/kg (Calculated) : 47.8 Heparin Dosing Weight: 91 kg  Vital Signs: Temp: 97.7 F (36.5 C) (07/30 0348) Temp Source: Axillary (07/30 0348) BP: 146/113 (07/30 0600) Pulse Rate: 90 (07/30 0600)  Labs: Recent Labs    12/30/19 2034 12/30/19 2157 12/31/19 0227 12/31/19 0535  HGB 13.5  --   --   --   HCT 42.9  --   --   --   PLT 232  --   --   --   HEPARINUNFRC  --   --  0.69 0.97*  CREATININE 1.69*  --   --   --   TROPONINIHS  --  31* 38* 31*    Estimated Creatinine Clearance: 63 mL/min (A) (by C-G formula based on SCr of 1.69 mg/dL (H)).   Medical History: Past Medical History:  Diagnosis Date  . Allergies   . Asthma   . Back pain   . Depression    hx, doing ok now  . Edema, lower extremity   . GERD (gastroesophageal reflux disease)   . Headache(784.0)   . Heart murmur   . Hypertension    on meds  . IBS (irritable bowel syndrome)   . Joint pain   . Neuropathy   . Obesity   . Osteoarthritis   . Ovarian cyst   . Prediabetes   . Preterm labor   . Vitamin D deficiency     Medications:  Scheduled:  Infusions:   Assessment: 44 yo female with new PE to start IV heparin per Rx dosing. Not on anticoagulation PTA.  Today, 12/31/19   Heparin level SUPRAtherapeutic on current IV heparin rate of 1900 units/hr  Per RN, no bleeding noted or problems with IV line, etc  CBC not drawn yet  Goal of Therapy:  Heparin level 0.3-0.7 units/ml Monitor platelets by anticoagulation protocol: Yes   Plan:   Reduce IV heparin from 1900 units/hr to 1350 units/hr  Recheck heparin level 6 hours after heparin rate change  Daily CBC  Kara Mead 12/31/2019,6:26 AM

## 2019-12-31 NOTE — Progress Notes (Signed)
Brief Pharmacy Consult Note - IV heparin follow up  Labs: heparin level 0.35  A/P: heparin level therapeutic now x 2 (goal 0.3-0.7) on current IV heparin rate of 1350 units/hr. No reported bleeding. Continue current IV heparin rate. Recheck heparin level in AM  Adrian Saran, PharmD, BCPS 12/31/2019 9:46 PM

## 2019-12-31 NOTE — Progress Notes (Signed)
   12/31/19 1400  Clinical Encounter Type  Visited With Patient not available  Visit Type Initial;Psychological support;Spiritual support  Referral From Nurse  Consult/Referral To Chaplain  Spiritual Encounters  Spiritual Needs Emotional;Other (Comment) (Spiritual Care Conversation/Support)  Stress Factors  Patient Stress Factors Not reviewed  Family Stress Factors Not reviewed   I attempted to visit with [name] per spiritual care consult. She was not available at the time of my attempt. I will follow up with her at a later time.   Please, contact Spiritual Care for further assistance.   Chaplain Shanon Ace M.Div., [facility]

## 2019-12-31 NOTE — Progress Notes (Signed)
ANTICOAGULATION CONSULT NOTE - follow up  Pharmacy Consult for IV heparin Indication: pulmonary embolus  Allergies  Allergen Reactions   Doxycycline Nausea And Vomiting   Flagyl [Metronidazole Hcl] Hives and Nausea And Vomiting    Patient Measurements: Height: 5\' 1"  (154.9 cm) Weight: (!) 168.3 kg (371 lb 0.6 oz) IBW/kg (Calculated) : 47.8 Heparin Dosing Weight: 91 kg  Vital Signs: Temp: 98 F (36.7 C) (07/30 1253) Temp Source: Oral (07/30 1253) BP: 160/124 (07/30 1300) Pulse Rate: 86 (07/30 1300)  Labs: Recent Labs    12/30/19 2034 12/30/19 2157 12/31/19 0227 12/31/19 0530 12/31/19 0535 12/31/19 0803 12/31/19 1338  HGB 13.5  --  12.2  --   --   --   --   HCT 42.9  --  39.2  --   --   --   --   PLT 232  --  227  --   --   --   --   HEPARINUNFRC  --   --  0.69  --  0.97*  --  0.60  CREATININE 1.69*  --   --  1.61*  --   --   --   TROPONINIHS  --    < > 38*  --  31* 27*  --    < > = values in this interval not displayed.    Estimated Creatinine Clearance: 66.2 mL/min (A) (by C-G formula based on SCr of 1.61 mg/dL (H)).   Medical History: Past Medical History:  Diagnosis Date   Allergies    Asthma    Back pain    Depression    hx, doing ok now   Edema, lower extremity    GERD (gastroesophageal reflux disease)    Headache(784.0)    Heart murmur    Hypertension    on meds   IBS (irritable bowel syndrome)    Joint pain    Neuropathy    Obesity    Osteoarthritis    Ovarian cyst    Prediabetes    Preterm labor    Vitamin D deficiency     Medications:  Scheduled:  Infusions:   Assessment: 44 yo female with new PE to start IV heparin per Rx dosing. Not on anticoagulation PTA.  Today, 12/31/19   0535 Heparin level SUPRAtherapeutic on current IV heparin rate of 1900 units/hr  Per RN, no bleeding noted or problems with IV line, etc  1507 HL 0.6, therapeutic on 1350 units/hr  CBC WNL   Goal of Therapy:  Heparin level  0.3-0.7 units/ml Monitor platelets by anticoagulation protocol: Yes   Plan:   continue IV heparin at 1350 units/hr  Recheck heparin level in 6 hours   Daily CBC  Dolly Rias RPh 12/31/2019, 3:39 PM

## 2019-12-31 NOTE — Progress Notes (Signed)
Lower extremity venous has been completed.   Preliminary results in CV Proc.   Abram Sander 12/31/2019 2:48 PM

## 2019-12-31 NOTE — Progress Notes (Signed)
[facility]                                                                              Patient Demographics  Angela [name], is a 44 y.o. female, DOB - 28-May-1976, JJK:[id number]  Admit date - 12/30/2019   Admitting Physician Shela Leff, MD  Outpatient Primary MD for the patient is Ladell Pier, MD  Outpatient specialists:   LOS - 1  days   Medical records reviewed and are as summarized below:    Chief Complaint  Patient presents with   Shortness of Breath       Brief summary   Patient is a 44 year old female with history of morbid obesity (BMI 68.02), asthma, hypertension, hypothyroidism, GERD, IBS, neuropathy, prediabetes presented with shortness of breath for past 4 days prior to admission.  Denied any fevers.  Stated that she had left-sided chest discomfort 2 days PTA and went to urgent care at that time.  States her legs are always swollen, denied any calf pain, denies any personal history of blood clots.  No recent travel, surgery or OCP use.  States her mother had a blood clot.  Reported that she had some diarrhea on the day of admission after drinking tea which usually upsets her stomach.  Otherwise having no diarrhea.  Chest x-ray showed no active cardiopulmonary disease CT angiogram showed acute bilateral pulmonary embolus with large clot burden and evidence of right heart strain, involving both main pulmonary arteries extending into all lobar and many segmental and subsegmental branches.  Straightening of the intraventricular septum with RV to LV ratio of 2.1.  Assessment & Plan    Principal Problem:   Acute bilateral pulmonary embolism (HCC) with large clot burden and evidence of right heart strain -Likely risk factor sedentary lifestyle due to chronic knee pain with morbid obesity, also reported family history of blood clots in mother -Per EDP, critical care did not feel thrombolytic therapy was needed.  Currently hemodynamically  stable, no tachycardia, hypoxia, not on O2 or hypotension - O2 sats at 93% on room air at the time of my examination - will continue IV heparin drip today, obtain venous Doppler bilateral lower extremities -2D echo showed EF of 6065% with moderate LVH, no regional wall motion abnormality, severely elevated pulmonary artery systolic pressure. - CM consult for NOACs -Elevated BNP 1164  Active Problems:   HTN (hypertension), benign -BP currently elevated likely due to #1 -Continue home Coreg, added hydralazine IV as needed with parameters    AKI (acute kidney injury) ([place]) -Likely due to #1 and decreased renal perfusion, HCTZ -Baseline creatinine 1.1, presented with creatinine of 1.69 -Mildly improving, 1.61    Asthma -Currently stable, no wheezing    Hypothyroidism Continue Synthroid, TSH 5.3 in 11/2019   Morbid obesity Estimated body mass index is 70.11 kg/m as calculated from the following:   Height as of this encounter: 5\' 1"  (1.549 m).   Weight as of this encounter: 168.3 kg.  Code Status: Full code DVT Prophylaxis: IV heparin drip Family Communication: Discussed all imaging results, lab results, explained to the patient    Disposition Plan:  Status is: Inpatient  Remains inpatient appropriate because:Inpatient level of care appropriate due to severity of illness   Dispo: The patient is from: Home              Anticipated d/c is to: Home              Anticipated d/c date is: 2 days              Patient currently is not medically stable to d/c.      Time Spent in minutes   35 minutes  Procedures:  CTA chest  Consultants:   None  Antimicrobials:   Anti-infectives (From admission, onward)   None          Medications  Scheduled Meds:  carvedilol  25 mg Oral BID WC   Chlorhexidine Gluconate Cloth  6 each Topical Daily   levothyroxine  12.5 mcg Oral Q0600   loratadine  10 mg Oral Daily   montelukast  10 mg Oral QHS   rosuvastatin  2.5 mg  Oral QHS   Continuous Infusions:  heparin 1,350 Units/hr (12/31/19 0644)   PRN Meds:.acetaminophen **OR** acetaminophen, albuterol      Subjective:   Angela [name] was seen and examined today.  Feeling somewhat better from the time of admission, no acute dyspnea.  No chest pain, dizziness, lightheadedness or tachycardia. Patient denies dizziness,  abdominal pain, N/V/D/C, new weakness, numbess, tingling.  Objective:   Vitals:   12/31/19 0600 12/31/19 0800 12/31/19 1100 12/31/19 1200  BP: (!) 146/113  (!) 142/106 (!) 138/95  Pulse: 90  87 91  Resp: 23  (!) 25 (!) 28  Temp:  98.6 F (37 C)    TempSrc:  Axillary    SpO2: 91%  91% 92%  Weight:      Height:        Intake/Output Summary (Last 24 hours) at 12/31/2019 1216 Last data filed at 12/31/2019 0400 Gross per 24 hour  Intake 232.36 ml  Output --  Net 232.36 ml     Wt Readings from Last 3 Encounters:  12/31/19 (!) 168.3 kg  12/23/19 (!) 163.3 kg  12/09/19 (!) 163.3 kg     Exam  General: Alert and oriented x 3, NAD  Cardiovascular: S1 S2 auscultated, no murmurs, RRR  Respiratory: Clear to auscultation bilaterally, no wheezing, rales or rhonchi  Gastrointestinal: Morbidly obese, soft, nontender, nondistended, + bowel sounds  Ext: + pedal edema bilaterally  Neuro: no new deficits  Musculoskeletal: No digital cyanosis, clubbing  Skin: No rashes  Psych: Normal affect and demeanor, alert and oriented x3    Data Reviewed:  I have personally reviewed following labs and imaging studies  Micro Results Recent Results (from the past 240 hour(s))  SARS CORONAVIRUS 2 (TAT 6-24 HRS) Nasopharyngeal Nasopharyngeal Swab     Status: None   Collection Time: 12/28/19  5:27 PM   Specimen: Nasopharyngeal Swab  Result Value Ref Range Status   SARS Coronavirus 2 NEGATIVE NEGATIVE Final    Comment: (NOTE) SARS-CoV-2 target nucleic acids are NOT DETECTED.  The SARS-CoV-2 RNA is generally detectable in upper and  lower respiratory specimens during the acute phase of infection. Negative results do not preclude SARS-CoV-2 infection, do not rule out co-infections with other pathogens, and should not be used as the sole basis for treatment or other patient management decisions. Negative results must be combined with clinical observations, patient history, and epidemiological information. The expected result is Negative.  Fact Sheet for Patients: SugarRoll.be  Fact Sheet for Healthcare Providers: https://www.woods-mathews.com/  This test is not yet approved or cleared by the [place] FDA and  has been authorized for detection and/or diagnosis of SARS-CoV-2 by FDA under an Emergency Use Authorization (EUA). This EUA will remain  in effect (meaning this test can be used) for the duration of the COVID-19 declaration under Se ction 564(b)(1) of the Act, 21 U.S.C. section 360bbb-3(b)(1), unless the authorization is terminated or revoked sooner.  Performed at [facility], [place] [address], [place], [place] [address]   SARS Coronavirus 2 by RT PCR (hospital order, performed in [facility] lab) Nasopharyngeal Nasopharyngeal Swab     Status: None   Collection Time: 12/30/19 11:31 PM   Specimen: Nasopharyngeal Swab  Result Value Ref Range Status   SARS Coronavirus 2 NEGATIVE NEGATIVE Final    Comment: (NOTE) SARS-CoV-2 target nucleic acids are NOT DETECTED.  The SARS-CoV-2 RNA is generally detectable in upper and lower respiratory specimens during the acute phase of infection. The lowest concentration of SARS-CoV-2 viral copies this assay can detect is 250 copies / mL. A negative result does not preclude SARS-CoV-2 infection and should not be used as the sole basis for treatment or other patient management decisions.  A negative result may occur with improper specimen collection / handling, submission of specimen other than nasopharyngeal  swab, presence of viral mutation(s) within the areas targeted by this assay, and inadequate number of viral copies (<250 copies / mL). A negative result must be combined with clinical observations, patient history, and epidemiological information.  Fact Sheet for Patients:   StrictlyIdeas.no  Fact Sheet for Healthcare Providers: BankingDealers.co.za  This test is not yet approved or  cleared by the [place] FDA and has been authorized for detection and/or diagnosis of SARS-CoV-2 by FDA under an Emergency Use Authorization (EUA).  This EUA will remain in effect (meaning this test can be used) for the duration of the COVID-19 declaration under Section 564(b)(1) of the Act, 21 U.S.C. section 360bbb-3(b)(1), unless the authorization is terminated or revoked sooner.  Performed at [facility], [place] [address] [place], [place] [address]   MRSA PCR Screening     Status: None   Collection Time: 12/31/19  1:02 AM   Specimen: Nasopharyngeal  Result Value Ref Range Status   MRSA by PCR NEGATIVE NEGATIVE Final    Comment:        The GeneXpert MRSA Assay (FDA approved for NASAL specimens only), is one component of a comprehensive MRSA colonization surveillance program. It is not intended to diagnose MRSA infection nor to guide or monitor treatment for MRSA infections. Performed at [facility], [place] [address] [place], [place] [address]     Radiology Reports DG Chest 2 View  Result Date: 12/30/2019 CLINICAL DATA:  Shortness of breath. EXAM: CHEST - 2 VIEW COMPARISON:  December 28, 2019. FINDINGS: Stable cardiomediastinal silhouette. No pneumothorax or pleural effusion is noted. Both lungs are clear. The visualized skeletal structures are unremarkable. IMPRESSION: No active cardiopulmonary disease. Electronically Signed   By: Marijo Conception M.D.   On: 12/30/2019 20:50   DG Chest 2 View  Result  Date: 12/28/2019 CLINICAL DATA:  44 year old female with history of cough and left lower chest pain. EXAM: CHEST - 2 VIEW COMPARISON:  Chest x-ray 07/26/2018. FINDINGS: Lung volumes are normal. No consolidative airspace disease. No pleural effusions. No pneumothorax. No pulmonary nodule or mass noted. Pulmonary vasculature is normal. Heart size is  mildly enlarged. Upper mediastinal contours are within normal limits. IMPRESSION: 1. No radiographic evidence of acute cardiopulmonary disease. 2. Mild cardiomegaly. Electronically Signed   By: Vinnie Langton M.D.   On: 12/28/2019 19:10   CT Angio Chest PE W and/or Wo Contrast  Result Date: 12/30/2019 CLINICAL DATA:  Shortness of breath for 4 days. EXAM: CT ANGIOGRAPHY CHEST WITH CONTRAST TECHNIQUE: Multidetector CT imaging of the chest was performed using the standard protocol during bolus administration of intravenous contrast. Multiplanar CT image reconstructions and MIPs were obtained to evaluate the vascular anatomy. CONTRAST:  84mL OMNIPAQUE IOHEXOL 350 MG/ML SOLN COMPARISON:  Radiograph earlier this day. FINDINGS: Cardiovascular: Examination is positive for acute pulmonary embolus with filling defects within the bilateral main pulmonary arteries extending into all lobar, many segmental and subsegmental branches. Thromboembolic burden is large. There is straightening of the intraventricular septum with RV to LV ratio of 2.1. Minimal contrast refluxes into the hepatic veins and IVC. The thoracic aorta is normal in caliber no evidence of aortic dissection. Left vertebral artery arises directly from the thoracic aorta. No pericardial effusion. Mediastinum/Nodes: No enlarged mediastinal or hilar lymph nodes. Decompressed esophagus. No obvious thyroid nodule. Lungs/Pleura: No focal airspace disease. No evidence of pulmonary infarct. No pleural fluid. No findings of pulmonary edema. Upper Abdomen: Prominent liver partially included. Musculoskeletal: There are no  acute or suspicious osseous abnormalities. Review of the MIP images confirms the above findings. IMPRESSION: 1. Examination is positive for acute bilateral pulmonary embolus with large clot burden and evidence of right heart strain. Emboli involve both main pulmonary arteries extending into all lobar and many segmental and subsegmental branches. Straightening of the intraventricular septum with RV to LV ratio of 2.1. Minimal contrast refluxes into the hepatic veins and IVC. 2. No pulmonary infarct. Critical Value/emergent results were called by telephone at the time of interpretation on 12/30/2019 at 11:00 pm to Dr Veryl Speak , who verbally acknowledged these results. Electronically Signed   By: Keith Rake M.D.   On: 12/30/2019 23:01   ECHOCARDIOGRAM COMPLETE  Result Date: 12/31/2019    ECHOCARDIOGRAM REPORT   Patient Name:   Angela [name] Date of Exam: 12/31/2019 Medical Rec #:  [id number]        Height:       61.0 in Accession #:    [id number]       Weight:       371.0 lb Date of Birth:  1976/03/03        BSA:          2.456 m Patient Age:    16 years         BP:           146/113 mmHg Patient Gender: F                HR:           90 bpm. Exam Location:  Inpatient Procedure: 2D Echo Indications:    Pulmonary Embolism  History:        Patient has no prior history of Echocardiogram examinations.                 Signs/Symptoms:Murmur; Risk Factors:Hypertension.  Sonographer:    Mikki Santee RDCS (AE) Referring Phys: [id number] [place]  1. Left ventricular ejection fraction, by estimation, is 60 to 65%. The left ventricle has normal function. The left ventricle has no regional wall motion abnormalities. There is moderate left ventricular hypertrophy and severe basal hypertrophy.  2. Right ventricular systolic function is normal. The right ventricular size is normal. There is severely elevated pulmonary artery systolic pressure. The estimated right ventricular systolic pressure is  63.8 mmHg.  3. The mitral valve is normal in structure. No evidence of mitral valve regurgitation. No evidence of mitral stenosis.  4. Tricuspid valve regurgitation is mild to moderate.  5. The aortic valve is normal in structure. Aortic valve regurgitation is not visualized. No aortic stenosis is present.  6. The inferior vena cava is dilated in size with >50% respiratory variability, suggesting right atrial pressure of 8 mmHg. FINDINGS  Left Ventricle: Left ventricular ejection fraction, by estimation, is 60 to 65%. The left ventricle has normal function. The left ventricle has no regional wall motion abnormalities. The left ventricular internal cavity size was normal in size. There is  moderate left ventricular hypertrophy. Left ventricular diastolic parameters are consistent with Grade I diastolic dysfunction (impaired relaxation). Normal left ventricular filling pressure. Right Ventricle: The right ventricular size is normal. No increase in right ventricular wall thickness. Right ventricular systolic function is normal. There is severely elevated pulmonary artery systolic pressure. The tricuspid regurgitant velocity is 3.73 m/s, and with an assumed right atrial pressure of 8 mmHg, the estimated right ventricular systolic pressure is 75.6 mmHg. Left Atrium: Left atrial size was normal in size. Right Atrium: Right atrial size was normal in size. Pericardium: There is no evidence of pericardial effusion. Mitral Valve: The mitral valve is normal in structure. Normal mobility of the mitral valve leaflets. Moderate mitral annular calcification. No evidence of mitral valve regurgitation. No evidence of mitral valve stenosis. Tricuspid Valve: The tricuspid valve is normal in structure. Tricuspid valve regurgitation is mild to moderate. No evidence of tricuspid stenosis. Aortic Valve: The aortic valve is normal in structure. Aortic valve regurgitation is not visualized. No aortic stenosis is present. Pulmonic Valve: The  pulmonic valve was normal in structure. Pulmonic valve regurgitation is trivial. No evidence of pulmonic stenosis. Aorta: The aortic root is normal in size and structure. Venous: The inferior vena cava is dilated in size with greater than 50% respiratory variability, suggesting right atrial pressure of 8 mmHg. IAS/Shunts: No atrial level shunt detected by color flow Doppler.  LEFT VENTRICLE PLAX 2D LVIDd:         3.50 cm  Diastology LVIDs:         2.20 cm  LV e' lateral:   6.42 cm/s LV PW:         1.40 cm  LV E/e' lateral: 10.1 LV IVS:        1.70 cm  LV e' medial:    7.40 cm/s LVOT diam:     2.10 cm  LV E/e' medial:  8.8 LV SV:         40 LV SV Index:   16 LVOT Area:     3.46 cm  RIGHT VENTRICLE TAPSE (M-mode): 2.1 cm LEFT ATRIUM             Index       RIGHT ATRIUM           Index LA diam:        2.90 cm 1.18 cm/m  RA Area:     20.30 cm LA Vol (A2C):   39.4 ml 16.04 ml/m RA Volume:   56.70 ml  23.09 ml/m LA Vol (A4C):   24.3 ml 9.89 ml/m LA Biplane Vol: 31.5 ml 12.83 ml/m  AORTIC VALVE LVOT Vmax:   67.90 cm/s  LVOT Vmean:  48.900 cm/s LVOT VTI:    0.116 m  AORTA Ao Root diam: 2.90 cm MITRAL VALVE               TRICUSPID VALVE MV Area (PHT): 2.93 cm    TR Peak grad:   55.7 mmHg MV Decel Time: 259 msec    TR Vmax:        373.00 cm/s MV E velocity: 65.10 cm/s MV A velocity: 75.00 cm/s  SHUNTS MV E/A ratio:  0.87        Systemic VTI:  0.12 m                            Systemic Diam: 2.10 cm Fransico Him MD Electronically signed by Fransico Him MD Signature Date/Time: 12/31/2019/11:17:22 AM    Final     Lab Data:  CBC: Recent Labs  Lab 12/30/19 2034 12/31/19 0227  WBC 7.8 9.1  NEUTROABS 5.2  --   HGB 13.5 12.2  HCT 42.9 39.2  MCV 87.7 88.1  PLT 232 675   Basic Metabolic Panel: Recent Labs  Lab 12/30/19 2034 12/31/19 0530  NA 139 137  K 4.1 3.7  CL 107 107  CO2 23 20*  GLUCOSE 127* 108*  BUN 26* 25*  CREATININE 1.69* 1.61*  CALCIUM 9.2 8.7*   GFR: Estimated Creatinine Clearance:  66.2 mL/min (A) (by C-G formula based on SCr of 1.61 mg/dL (H)). Liver Function Tests: Recent Labs  Lab 12/30/19 2034  AST 35  ALT 40  ALKPHOS 123  BILITOT 0.5  PROT 8.2*  ALBUMIN 4.0   No results for input(s): LIPASE, AMYLASE in the last 168 hours. No results for input(s): AMMONIA in the last 168 hours. Coagulation Profile: No results for input(s): INR, PROTIME in the last 168 hours. Cardiac Enzymes: No results for input(s): CKTOTAL, CKMB, CKMBINDEX, TROPONINI in the last 168 hours. BNP (last 3 results) No results for input(s): PROBNP in the last 8760 hours. HbA1C: No results for input(s): HGBA1C in the last 72 hours. CBG: No results for input(s): GLUCAP in the last 168 hours. Lipid Profile: No results for input(s): CHOL, HDL, LDLCALC, TRIG, CHOLHDL, LDLDIRECT in the last 72 hours. Thyroid Function Tests: No results for input(s): TSH, T4TOTAL, FREET4, T3FREE, THYROIDAB in the last 72 hours. Anemia Panel: No results for input(s): VITAMINB12, FOLATE, FERRITIN, TIBC, IRON, RETICCTPCT in the last 72 hours. Urine analysis:    Component Value Date/Time   COLORURINE YELLOW 07/27/2018 0100   APPEARANCEUR CLOUDY (A) 07/27/2018 0100   LABSPEC 1.021 07/27/2018 0100   PHURINE 5.0 07/27/2018 0100   GLUCOSEU NEGATIVE 07/27/2018 0100   HGBUR NEGATIVE 07/27/2018 0100   BILIRUBINUR NEGATIVE 07/27/2018 0100   KETONESUR NEGATIVE 07/27/2018 0100   PROTEINUR NEGATIVE 07/27/2018 0100   UROBILINOGEN 0.2 08/06/2016 1134   NITRITE NEGATIVE 07/27/2018 0100   LEUKOCYTESUR TRACE (A) 07/27/2018 0100     Angela [name] M.D. [facility] 12/31/2019, 12:16 PM   Call night coverage person covering after 7pm

## 2019-12-31 NOTE — Progress Notes (Signed)
  Echocardiogram 2D Echocardiogram has been performed.  Jennette Dubin 12/31/2019, 10:27 AM

## 2019-12-31 NOTE — Plan of Care (Signed)

## 2020-01-01 ENCOUNTER — Encounter (INDEPENDENT_AMBULATORY_CARE_PROVIDER_SITE_OTHER): Payer: Self-pay | Admitting: Family Medicine

## 2020-01-01 DIAGNOSIS — E039 Hypothyroidism, unspecified: Secondary | ICD-10-CM | POA: Diagnosis not present

## 2020-01-01 DIAGNOSIS — Z20822 Contact with and (suspected) exposure to covid-19: Secondary | ICD-10-CM | POA: Diagnosis not present

## 2020-01-01 DIAGNOSIS — Z6841 Body Mass Index (BMI) 40.0 and over, adult: Secondary | ICD-10-CM | POA: Diagnosis not present

## 2020-01-01 DIAGNOSIS — I2699 Other pulmonary embolism without acute cor pulmonale: Secondary | ICD-10-CM | POA: Diagnosis not present

## 2020-01-01 DIAGNOSIS — N1832 Chronic kidney disease, stage 3b: Secondary | ICD-10-CM | POA: Diagnosis not present

## 2020-01-01 DIAGNOSIS — E876 Hypokalemia: Secondary | ICD-10-CM | POA: Diagnosis not present

## 2020-01-01 DIAGNOSIS — I10 Essential (primary) hypertension: Secondary | ICD-10-CM | POA: Diagnosis not present

## 2020-01-01 DIAGNOSIS — I2609 Other pulmonary embolism with acute cor pulmonale: Secondary | ICD-10-CM | POA: Diagnosis not present

## 2020-01-01 DIAGNOSIS — J45909 Unspecified asthma, uncomplicated: Secondary | ICD-10-CM | POA: Diagnosis not present

## 2020-01-01 DIAGNOSIS — T502X5A Adverse effect of carbonic-anhydrase inhibitors, benzothiadiazides and other diuretics, initial encounter: Secondary | ICD-10-CM | POA: Diagnosis not present

## 2020-01-01 DIAGNOSIS — N179 Acute kidney failure, unspecified: Secondary | ICD-10-CM | POA: Diagnosis not present

## 2020-01-01 DIAGNOSIS — J4520 Mild intermittent asthma, uncomplicated: Secondary | ICD-10-CM | POA: Diagnosis not present

## 2020-01-01 DIAGNOSIS — I129 Hypertensive chronic kidney disease with stage 1 through stage 4 chronic kidney disease, or unspecified chronic kidney disease: Secondary | ICD-10-CM | POA: Diagnosis not present

## 2020-01-01 LAB — CBC
HCT: 37.7 % (ref 36.0–46.0)
Hemoglobin: 11.7 g/dL — ABNORMAL LOW (ref 12.0–15.0)
MCH: 27.3 pg (ref 26.0–34.0)
MCHC: 31 g/dL (ref 30.0–36.0)
MCV: 88.1 fL (ref 80.0–100.0)
Platelets: 221 10*3/uL (ref 150–400)
RBC: 4.28 MIL/uL (ref 3.87–5.11)
RDW: 14.1 % (ref 11.5–15.5)
WBC: 7.5 10*3/uL (ref 4.0–10.5)
nRBC: 0 % (ref 0.0–0.2)

## 2020-01-01 LAB — BASIC METABOLIC PANEL
Anion gap: 9 (ref 5–15)
BUN: 25 mg/dL — ABNORMAL HIGH (ref 6–20)
CO2: 22 mmol/L (ref 22–32)
Calcium: 8.7 mg/dL — ABNORMAL LOW (ref 8.9–10.3)
Chloride: 109 mmol/L (ref 98–111)
Creatinine, Ser: 1.32 mg/dL — ABNORMAL HIGH (ref 0.44–1.00)
GFR calc Af Amer: 56 mL/min — ABNORMAL LOW (ref >60)
GFR calc non Af Amer: 48 mL/min — ABNORMAL LOW (ref >60)
Glucose, Bld: 100 mg/dL — ABNORMAL HIGH (ref 70–99)
Potassium: 3.5 mmol/L (ref 3.5–5.1)
Sodium: 140 mmol/L (ref 135–145)

## 2020-01-01 LAB — HEPARIN LEVEL (UNFRACTIONATED): Heparin Unfractionated: 0.46 IU/mL (ref 0.30–0.70)

## 2020-01-01 LAB — HOMOCYSTEINE: Homocysteine: 13 umol/L (ref 0.0–14.5)

## 2020-01-01 MED ORDER — HYDRALAZINE HCL 20 MG/ML IJ SOLN
10.0000 mg | Freq: Once | INTRAMUSCULAR | Status: AC
  Administered 2020-01-01: 10 mg via INTRAVENOUS
  Filled 2020-01-01: qty 1

## 2020-01-01 MED ORDER — HYDROCHLOROTHIAZIDE 25 MG PO TABS
12.5000 mg | ORAL_TABLET | Freq: Every day | ORAL | Status: DC
  Administered 2020-01-01 – 2020-01-06 (×6): 12.5 mg via ORAL
  Filled 2020-01-01 (×6): qty 1

## 2020-01-01 MED ORDER — CARVEDILOL 6.25 MG PO TABS
6.2500 mg | ORAL_TABLET | Freq: Two times a day (BID) | ORAL | Status: DC
  Administered 2020-01-01: 6.25 mg via ORAL
  Filled 2020-01-01: qty 1

## 2020-01-01 NOTE — Progress Notes (Signed)
ANTICOAGULATION CONSULT NOTE - follow up  Pharmacy Consult for IV heparin Indication: pulmonary embolus  Allergies  Allergen Reactions  . Doxycycline Nausea And Vomiting  . Flagyl [Metronidazole Hcl] Hives and Nausea And Vomiting    Patient Measurements: Height: 5\' 1"  (154.9 cm) Weight: (!) 168.3 kg (371 lb 0.6 oz) IBW/kg (Calculated) : 47.8 Heparin Dosing Weight: 91 kg  Vital Signs: Temp: 98.4 F (36.9 C) (07/31 0300) Temp Source: Axillary (07/31 0300) BP: 162/126 (07/31 0100) Pulse Rate: 89 (07/31 0100)  Labs: Recent Labs    12/30/19 2034 12/30/19 2157 12/31/19 0227 12/31/19 0227 12/31/19 0530 12/31/19 0535 12/31/19 0535 12/31/19 0803 12/31/19 1338 12/31/19 2037 01/01/20 0224  HGB 13.5   < > 12.2  --   --   --   --   --   --   --  11.7*  HCT 42.9  --  39.2  --   --   --   --   --   --   --  37.7  PLT 232  --  227  --   --   --   --   --   --   --  221  HEPARINUNFRC  --   --  0.69   < >  --  0.97*   < >  --  0.60 0.35 0.46  CREATININE 1.69*  --   --   --  1.61*  --   --   --   --   --  1.32*  TROPONINIHS  --    < > 38*  --   --  31*  --  27*  --   --   --    < > = values in this interval not displayed.    Estimated Creatinine Clearance: 80.7 mL/min (A) (by C-G formula based on SCr of 1.32 mg/dL (H)).   Medical History: Past Medical History:  Diagnosis Date  . Allergies   . Asthma   . Back pain   . Depression    hx, doing ok now  . Edema, lower extremity   . GERD (gastroesophageal reflux disease)   . Headache(784.0)   . Heart murmur   . Hypertension    on meds  . IBS (irritable bowel syndrome)   . Joint pain   . Neuropathy   . Obesity   . Osteoarthritis   . Ovarian cyst   . Prediabetes   . Preterm labor   . Vitamin D deficiency     Medications:  Scheduled:  Infusions:   Assessment: 44 yo female with new PE to start IV heparin per Rx dosing. Not on anticoagulation PTA.  Today, 01/01/20   HL remains at goal  Per RN, no bleeding noted  or problems with IV line, etc  CBC stable   Goal of Therapy:  Heparin level 0.3-0.7 units/ml Monitor platelets by anticoagulation protocol: Yes   Plan:   continue IV heparin at 1350 units/hr  Daily HL/CBC  Ulice Dash, PharmD, BCPS    01/01/2020, 10:23 AM

## 2020-01-01 NOTE — Progress Notes (Signed)
Received patient from ICU. Oriented to room. Patient voices no complaints at present.Eulas Post, RN

## 2020-01-01 NOTE — Progress Notes (Addendum)
Patient had short bradycardic episode reading 39 bpm. On assessment patient asleep and RN woke patient up. Alert and oriented x4- HR increased to 70 NSR. EKG obtained and MD notifed

## 2020-01-01 NOTE — Progress Notes (Signed)
[facility]                                                                              Patient Demographics  Angela [name], is a 44 y.o. female, DOB - [birth year], ALP:[id number]  Admit date - 12/30/2019   Admitting Physician Shela Leff, MD  Outpatient Primary MD for the patient is Ladell Pier, MD  Outpatient specialists:   LOS - 2  days   Medical records reviewed and are as summarized below:    Chief Complaint  Patient presents with  . Shortness of Breath       Brief summary   Patient is a 44 year old female with history of morbid obesity (BMI 68.02), asthma, hypertension, hypothyroidism, GERD, IBS, neuropathy, prediabetes presented with shortness of breath for past 4 days prior to admission.  Denied any fevers.  Stated that she had left-sided chest discomfort 2 days PTA and went to urgent care at that time.  States her legs are always swollen, denied any calf pain, denies any personal history of blood clots.  No recent travel, surgery or OCP use.  States her mother had a blood clot.  Reported that she had some diarrhea on the day of admission after drinking tea which usually upsets her stomach.  Otherwise having no diarrhea.  Chest x-ray showed no active cardiopulmonary disease CT angiogram showed acute bilateral pulmonary embolus with large clot burden and evidence of right heart strain, involving both main pulmonary arteries extending into all lobar and many segmental and subsegmental branches.  Straightening of the intraventricular septum with RV to LV ratio of 2.1.  Assessment & Plan    Principal Problem:   Acute bilateral pulmonary embolism (HCC) with large clot burden and evidence of right heart strain -Likely risk factor sedentary lifestyle due to chronic knee pain with morbid obesity, also reported family history of blood clots in mother -Per EDP, critical care did not feel thrombolytic therapy was needed.  Currently hemodynamically  stable, no tachycardia, hypoxia, not on O2 or hypotension -O2 sats 100% on room air at the time of my examination -Venous Dopplers negative for any DVT -2D echo showed EF of 60-65% with moderate LVH, no regional wall motion abnormality, severely elevated pulmonary artery systolic pressure. --Elevated BNP 1164 -Case management consulted for NOACs, likely will start patient on Xarelto versus Eliquis in a.m.  Active Problems:   HTN (hypertension), benign -BP currently elevated, does not want to take any of the medications, states that she has started the weight loss clinic and her doses were changed -Agreed to take Coreg 6.25 mg twice daily and HCTZ 12.5 mg daily.  Subsequently heart rate briefly dropped showed sinus rhythm rate 70.  Coreg held -Will place on hydralazine IV as needed with parameters, discussed with the patient that she will likely need another agent because of uncontrolled hypertension    AKI (acute kidney injury) ([place]) -Likely due to #1 and decreased renal perfusion, HCTZ -Baseline creatinine 1.1, presented with creatinine of 1.69 -Creatinine improving 1.3    Asthma -Currently stable, no wheezing    Hypothyroidism Continue Synthroid, TSH 5.3 in 11/2019   Morbid obesity Estimated  body mass index is 70.11 kg/m as calculated from the following:   Height as of this encounter: 5\' 1"  (1.549 m).   Weight as of this encounter: 168.3 kg.  Patient has started weight loss clinic  Code Status: Full code DVT Prophylaxis: IV heparin drip Family Communication: Discussed all imaging results, lab results, explained to the patient    Disposition Plan:     Status is: Inpatient  Remains inpatient appropriate because:Inpatient level of care appropriate due to severity of illness   Dispo: The patient is from: Home              Anticipated d/c is to: Home              Anticipated d/c date is: 2 days              Patient currently is not medically stable to d/c.  Continue IV  heparin drip today, transfer to telemetry floor, will start NOACs in the morning      Time Spent in minutes   35 minutes  Procedures:  CTA chest  Consultants:   None  Antimicrobials:   Anti-infectives (From admission, onward)   None         Medications  Scheduled Meds: . Chlorhexidine Gluconate Cloth  6 each Topical Daily  . gabapentin  300 mg Oral BID  . hydrochlorothiazide  12.5 mg Oral Daily  . levothyroxine  12.5 mcg Oral Q0600  . loratadine  10 mg Oral Daily  . montelukast  10 mg Oral QHS  . rosuvastatin  2.5 mg Oral QHS   Continuous Infusions: . heparin 1,350 Units/hr (01/01/20 1200)   PRN Meds:.acetaminophen **OR** acetaminophen, albuterol, hydrALAZINE      Subjective:   Angela [name] was seen and examined today.  BP still uncontrolled, patient refused to Coreg and HCTZ yesterday.  No headache, blurred vision, chest pain.  No acute shortness of breath.  Patient denies dizziness,  abdominal pain, N/V/D/C, new weakness, numbess, tingling.  Objective:   Vitals:   01/01/20 0000 01/01/20 0100 01/01/20 0300 01/01/20 0800  BP: (!) 187/144 (!) 162/126  (!) 152/105  Pulse: 80 89  75  Resp: 23 15  (!) 28  Temp:  98 F (36.7 C) 98.4 F (36.9 C) 98 F (36.7 C)  TempSrc:  Oral Axillary Oral  SpO2: 98% 97%  95%  Weight:      Height:        Intake/Output Summary (Last 24 hours) at 01/01/2020 1247 Last data filed at 01/01/2020 1200 Gross per 24 hour  Intake 620.55 ml  Output --  Net 620.55 ml     Wt Readings from Last 3 Encounters:  12/31/19 (!) 168.3 kg  12/23/19 (!) 163.3 kg  12/09/19 (!) 163.3 kg   Physical Exam  General: Alert and oriented x 3, NAD  Cardiovascular: S1 S2 clear, RRR.  Trace pedal edema b/l  Respiratory: CTAB, no wheezing, rales or rhonchi  Gastrointestinal: Obese, soft, nontender, nondistended, NBS  Ext: trace pedal edema bilaterally  Neuro: no new deficits  Musculoskeletal: No cyanosis, clubbing  Skin: No  rashes  Psych: Normal affect and demeanor, alert and oriented x3    Data Reviewed:  I have personally reviewed following labs and imaging studies  Micro Results Recent Results (from the past 240 hour(s))  SARS CORONAVIRUS 2 (TAT 6-24 HRS) Nasopharyngeal Nasopharyngeal Swab     Status: None   Collection Time: 12/28/19  5:27 PM   Specimen: Nasopharyngeal Swab  Result Value  Ref Range Status   SARS Coronavirus 2 NEGATIVE NEGATIVE Final    Comment: (NOTE) SARS-CoV-2 target nucleic acids are NOT DETECTED.  The SARS-CoV-2 RNA is generally detectable in upper and lower respiratory specimens during the acute phase of infection. Negative results do not preclude SARS-CoV-2 infection, do not rule out co-infections with other pathogens, and should not be used as the sole basis for treatment or other patient management decisions. Negative results must be combined with clinical observations, patient history, and epidemiological information. The expected result is Negative.  Fact Sheet for Patients: SugarRoll.be  Fact Sheet for Healthcare Providers: https://www.woods-mathews.com/  This test is not yet approved or cleared by the [place] FDA and  has been authorized for detection and/or diagnosis of SARS-CoV-2 by FDA under an Emergency Use Authorization (EUA). This EUA will remain  in effect (meaning this test can be used) for the duration of the COVID-19 declaration under Se ction 564(b)(1) of the Act, 21 U.S.C. section 360bbb-3(b)(1), unless the authorization is terminated or revoked sooner.  Performed at [facility], [place] [address] [place], [place] [address]   SARS Coronavirus 2 by RT PCR (hospital order, performed in [facility] hospital lab) Nasopharyngeal Nasopharyngeal Swab     Status: None   Collection Time: 12/30/19 11:31 PM   Specimen: Nasopharyngeal Swab  Result Value Ref Range Status   SARS Coronavirus 2 NEGATIVE NEGATIVE  Final    Comment: (NOTE) SARS-CoV-2 target nucleic acids are NOT DETECTED.  The SARS-CoV-2 RNA is generally detectable in upper and lower respiratory specimens during the acute phase of infection. The lowest concentration of SARS-CoV-2 viral copies this assay can detect is 250 copies / mL. A negative result does not preclude SARS-CoV-2 infection and should not be used as the sole basis for treatment or other patient management decisions.  A negative result may occur with improper specimen collection / handling, submission of specimen other than nasopharyngeal swab, presence of viral mutation(s) within the areas targeted by this assay, and inadequate number of viral copies (<250 copies / mL). A negative result must be combined with clinical observations, patient history, and epidemiological information.  Fact Sheet for Patients:   StrictlyIdeas.no  Fact Sheet for Healthcare Providers: BankingDealers.co.za  This test is not yet approved or  cleared by the [place] FDA and has been authorized for detection and/or diagnosis of SARS-CoV-2 by FDA under an Emergency Use Authorization (EUA).  This EUA will remain in effect (meaning this test can be used) for the duration of the COVID-19 declaration under Section 564(b)(1) of the Act, 21 U.S.C. section 360bbb-3(b)(1), unless the authorization is terminated or revoked sooner.  Performed at [facility], [place] [address], [place], [place] [address]   MRSA PCR Screening     Status: None   Collection Time: 12/31/19  1:02 AM   Specimen: Nasopharyngeal  Result Value Ref Range Status   MRSA by PCR NEGATIVE NEGATIVE Final    Comment:        The GeneXpert MRSA Assay (FDA approved for NASAL specimens only), is one component of a comprehensive MRSA colonization surveillance program. It is not intended to diagnose MRSA infection nor to guide or monitor treatment for MRSA  infections. Performed at [facility], [place] [address], [place], [place] [address]     Radiology Reports DG Chest 2 View  Result Date: 12/30/2019 CLINICAL DATA:  Shortness of breath. EXAM: CHEST - 2 VIEW COMPARISON:  December 28, 2019. FINDINGS: Stable cardiomediastinal silhouette.  No pneumothorax or pleural effusion is noted. Both lungs are clear. The visualized skeletal structures are unremarkable. IMPRESSION: No active cardiopulmonary disease. Electronically Signed   By: Marijo Conception M.D.   On: 12/30/2019 20:50   DG Chest 2 View  Result Date: 12/28/2019 CLINICAL DATA:  44 year old female with history of cough and left lower chest pain. EXAM: CHEST - 2 VIEW COMPARISON:  Chest x-ray 07/26/2018. FINDINGS: Lung volumes are normal. No consolidative airspace disease. No pleural effusions. No pneumothorax. No pulmonary nodule or mass noted. Pulmonary vasculature is normal. Heart size is mildly enlarged. Upper mediastinal contours are within normal limits. IMPRESSION: 1. No radiographic evidence of acute cardiopulmonary disease. 2. Mild cardiomegaly. Electronically Signed   By: Vinnie Langton M.D.   On: 12/28/2019 19:10   CT Angio Chest PE W and/or Wo Contrast  Result Date: 12/30/2019 CLINICAL DATA:  Shortness of breath for 4 days. EXAM: CT ANGIOGRAPHY CHEST WITH CONTRAST TECHNIQUE: Multidetector CT imaging of the chest was performed using the standard protocol during bolus administration of intravenous contrast. Multiplanar CT image reconstructions and MIPs were obtained to evaluate the vascular anatomy. CONTRAST:  58mL OMNIPAQUE IOHEXOL 350 MG/ML SOLN COMPARISON:  Radiograph earlier this day. FINDINGS: Cardiovascular: Examination is positive for acute pulmonary embolus with filling defects within the bilateral main pulmonary arteries extending into all lobar, many segmental and subsegmental branches. Thromboembolic burden is large. There is straightening of the intraventricular septum  with RV to LV ratio of 2.1. Minimal contrast refluxes into the hepatic veins and IVC. The thoracic aorta is normal in caliber no evidence of aortic dissection. Left vertebral artery arises directly from the thoracic aorta. No pericardial effusion. Mediastinum/Nodes: No enlarged mediastinal or hilar lymph nodes. Decompressed esophagus. No obvious thyroid nodule. Lungs/Pleura: No focal airspace disease. No evidence of pulmonary infarct. No pleural fluid. No findings of pulmonary edema. Upper Abdomen: Prominent liver partially included. Musculoskeletal: There are no acute or suspicious osseous abnormalities. Review of the MIP images confirms the above findings. IMPRESSION: 1. Examination is positive for acute bilateral pulmonary embolus with large clot burden and evidence of right heart strain. Emboli involve both main pulmonary arteries extending into all lobar and many segmental and subsegmental branches. Straightening of the intraventricular septum with RV to LV ratio of 2.1. Minimal contrast refluxes into the hepatic veins and IVC. 2. No pulmonary infarct. Critical Value/emergent results were called by telephone at the time of interpretation on 12/30/2019 at 11:00 pm to Dr Veryl Speak , who verbally acknowledged these results. Electronically Signed   By: Keith Rake M.D.   On: 12/30/2019 23:01   ECHOCARDIOGRAM COMPLETE  Result Date: 12/31/2019    ECHOCARDIOGRAM REPORT   Patient Name:   Angela [name] Date of Exam: 12/31/2019 Medical Rec #:  [id number]        Height:       61.0 in Accession #:    [id number]       Weight:       371.0 lb Date of Birth:  10/13/1975        BSA:          2.456 m Patient Age:    43 years         BP:           146/113 mmHg Patient Gender: F                HR:           90 bpm. Exam Location:  Inpatient  Procedure: 2D Echo Indications:    Pulmonary Embolism  History:        Patient has no prior history of Echocardiogram examinations.                 Signs/Symptoms:Murmur; Risk  Factors:Hypertension.  Sonographer:    Mikki Santee RDCS (AE) Referring Phys: [id number] [place]  1. Left ventricular ejection fraction, by estimation, is 60 to 65%. The left ventricle has normal function. The left ventricle has no regional wall motion abnormalities. There is moderate left ventricular hypertrophy and severe basal hypertrophy.  2. Right ventricular systolic function is normal. The right ventricular size is normal. There is severely elevated pulmonary artery systolic pressure. The estimated right ventricular systolic pressure is 09.9 mmHg.  3. The mitral valve is normal in structure. No evidence of mitral valve regurgitation. No evidence of mitral stenosis.  4. Tricuspid valve regurgitation is mild to moderate.  5. The aortic valve is normal in structure. Aortic valve regurgitation is not visualized. No aortic stenosis is present.  6. The inferior vena cava is dilated in size with >50% respiratory variability, suggesting right atrial pressure of 8 mmHg. FINDINGS  Left Ventricle: Left ventricular ejection fraction, by estimation, is 60 to 65%. The left ventricle has normal function. The left ventricle has no regional wall motion abnormalities. The left ventricular internal cavity size was normal in size. There is  moderate left ventricular hypertrophy. Left ventricular diastolic parameters are consistent with Grade I diastolic dysfunction (impaired relaxation). Normal left ventricular filling pressure. Right Ventricle: The right ventricular size is normal. No increase in right ventricular wall thickness. Right ventricular systolic function is normal. There is severely elevated pulmonary artery systolic pressure. The tricuspid regurgitant velocity is 3.73 m/s, and with an assumed right atrial pressure of 8 mmHg, the estimated right ventricular systolic pressure is 83.3 mmHg. Left Atrium: Left atrial size was normal in size. Right Atrium: Right atrial size was normal in size.  Pericardium: There is no evidence of pericardial effusion. Mitral Valve: The mitral valve is normal in structure. Normal mobility of the mitral valve leaflets. Moderate mitral annular calcification. No evidence of mitral valve regurgitation. No evidence of mitral valve stenosis. Tricuspid Valve: The tricuspid valve is normal in structure. Tricuspid valve regurgitation is mild to moderate. No evidence of tricuspid stenosis. Aortic Valve: The aortic valve is normal in structure. Aortic valve regurgitation is not visualized. No aortic stenosis is present. Pulmonic Valve: The pulmonic valve was normal in structure. Pulmonic valve regurgitation is trivial. No evidence of pulmonic stenosis. Aorta: The aortic root is normal in size and structure. Venous: The inferior vena cava is dilated in size with greater than 50% respiratory variability, suggesting right atrial pressure of 8 mmHg. IAS/Shunts: No atrial level shunt detected by color flow Doppler.  LEFT VENTRICLE PLAX 2D LVIDd:         3.50 cm  Diastology LVIDs:         2.20 cm  LV e' lateral:   6.42 cm/s LV PW:         1.40 cm  LV E/e' lateral: 10.1 LV IVS:        1.70 cm  LV e' medial:    7.40 cm/s LVOT diam:     2.10 cm  LV E/e' medial:  8.8 LV SV:         40 LV SV Index:   16 LVOT Area:     3.46 cm  RIGHT VENTRICLE TAPSE (M-mode): 2.1 cm LEFT  ATRIUM             Index       RIGHT ATRIUM           Index LA diam:        2.90 cm 1.18 cm/m  RA Area:     20.30 cm LA Vol (A2C):   39.4 ml 16.04 ml/m RA Volume:   56.70 ml  23.09 ml/m LA Vol (A4C):   24.3 ml 9.89 ml/m LA Biplane Vol: 31.5 ml 12.83 ml/m  AORTIC VALVE LVOT Vmax:   67.90 cm/s LVOT Vmean:  48.900 cm/s LVOT VTI:    0.116 m  AORTA Ao Root diam: 2.90 cm MITRAL VALVE               TRICUSPID VALVE MV Area (PHT): 2.93 cm    TR Peak grad:   55.7 mmHg MV Decel Time: 259 msec    TR Vmax:        373.00 cm/s MV E velocity: 65.10 cm/s MV A velocity: 75.00 cm/s  SHUNTS MV E/A ratio:  0.87        Systemic VTI:  0.12 m                             Systemic Diam: 2.10 cm Fransico Him MD Electronically signed by Fransico Him MD Signature Date/Time: 12/31/2019/11:17:22 AM    Final    VAS [place] LOWER EXTREMITY VENOUS (DVT)  Result Date: 01/01/2020  Lower Venous DVTStudy Indications: Pulmonary embolism.  Limitations: Body habitus and poor ultrasound/tissue interface. Comparison Study: no prior Performing Technologist: Abram Sander RVS  Examination Guidelines: A complete evaluation includes B-mode imaging, spectral Doppler, color Doppler, and power Doppler as needed of all accessible portions of each vessel. Bilateral testing is considered an integral part of a complete examination. Limited examinations for reoccurring indications may be performed as noted. The reflux portion of the exam is performed with the patient in reverse Trendelenburg.  +---------+---------------+---------+-----------+----------+--------------+ RIGHT    CompressibilityPhasicitySpontaneityPropertiesThrombus Aging +---------+---------------+---------+-----------+----------+--------------+ CFV      Full           Yes      Yes                                 +---------+---------------+---------+-----------+----------+--------------+ SFJ      Full                                                        +---------+---------------+---------+-----------+----------+--------------+ FV Prox  Full                                                        +---------+---------------+---------+-----------+----------+--------------+ FV Mid   Full                                                        +---------+---------------+---------+-----------+----------+--------------+ FV DistalFull                                                        +---------+---------------+---------+-----------+----------+--------------+  PFV      Full                                                         +---------+---------------+---------+-----------+----------+--------------+ POP                                                   Not visualized +---------+---------------+---------+-----------+----------+--------------+ PTV                                                   Not visualized +---------+---------------+---------+-----------+----------+--------------+ PERO                                                  Not visualized +---------+---------------+---------+-----------+----------+--------------+   +---------+---------------+---------+-----------+----------+--------------+ LEFT     CompressibilityPhasicitySpontaneityPropertiesThrombus Aging +---------+---------------+---------+-----------+----------+--------------+ CFV      Full           Yes      Yes                                 +---------+---------------+---------+-----------+----------+--------------+ SFJ      Full                                                        +---------+---------------+---------+-----------+----------+--------------+ FV Prox  Full                                                        +---------+---------------+---------+-----------+----------+--------------+ FV Mid   Full                                                        +---------+---------------+---------+-----------+----------+--------------+ FV DistalFull                                                        +---------+---------------+---------+-----------+----------+--------------+ PFV      Full                                                        +---------+---------------+---------+-----------+----------+--------------+ POP  Not visualized +---------+---------------+---------+-----------+----------+--------------+ PTV                                                   Not visualized  +---------+---------------+---------+-----------+----------+--------------+ PERO                                                  Not visualized +---------+---------------+---------+-----------+----------+--------------+     Summary: RIGHT: - There is no evidence of deep vein thrombosis in the lower extremity. However, portions of this examination were limited- see technologist comments above.  LEFT: - There is no evidence of deep vein thrombosis in the lower extremity. However, portions of this examination were limited- see technologist comments above.  *See table(s) above for measurements and observations. Electronically signed by Deitra Mayo MD on 01/01/2020 at 4:19:34 AM.    Final     Lab Data:  CBC: Recent Labs  Lab 12/30/19 2034 12/31/19 0227 01/01/20 0224  WBC 7.8 9.1 7.5  NEUTROABS 5.2  --   --   HGB 13.5 12.2 11.7*  HCT 42.9 39.2 37.7  MCV 87.7 88.1 88.1  PLT 232 227 098   Basic Metabolic Panel: Recent Labs  Lab 12/30/19 2034 12/31/19 0530 01/01/20 0224  NA 139 137 140  K 4.1 3.7 3.5  CL 107 107 109  CO2 23 20* 22  GLUCOSE 127* 108* 100*  BUN 26* 25* 25*  CREATININE 1.69* 1.61* 1.32*  CALCIUM 9.2 8.7* 8.7*   GFR: Estimated Creatinine Clearance: 80.7 mL/min (A) (by C-G formula based on SCr of 1.32 mg/dL (H)). Liver Function Tests: Recent Labs  Lab 12/30/19 2034  AST 35  ALT 40  ALKPHOS 123  BILITOT 0.5  PROT 8.2*  ALBUMIN 4.0   No results for input(s): LIPASE, AMYLASE in the last 168 hours. No results for input(s): AMMONIA in the last 168 hours. Coagulation Profile: No results for input(s): INR, PROTIME in the last 168 hours. Cardiac Enzymes: No results for input(s): CKTOTAL, CKMB, CKMBINDEX, TROPONINI in the last 168 hours. BNP (last 3 results) No results for input(s): PROBNP in the last 8760 hours. HbA1C: No results for input(s): HGBA1C in the last 72 hours. CBG: No results for input(s): GLUCAP in the last 168 hours. Lipid Profile: No  results for input(s): CHOL, HDL, LDLCALC, TRIG, CHOLHDL, LDLDIRECT in the last 72 hours. Thyroid Function Tests: No results for input(s): TSH, T4TOTAL, FREET4, T3FREE, THYROIDAB in the last 72 hours. Anemia Panel: No results for input(s): VITAMINB12, FOLATE, FERRITIN, TIBC, IRON, RETICCTPCT in the last 72 hours. Urine analysis:    Component Value Date/Time   COLORURINE YELLOW 07/27/2018 0100   APPEARANCEUR CLOUDY (A) 07/27/2018 0100   LABSPEC 1.021 07/27/2018 0100   PHURINE 5.0 07/27/2018 0100   GLUCOSEU NEGATIVE 07/27/2018 0100   HGBUR NEGATIVE 07/27/2018 0100   BILIRUBINUR NEGATIVE 07/27/2018 0100   KETONESUR NEGATIVE 07/27/2018 0100   PROTEINUR NEGATIVE 07/27/2018 0100   UROBILINOGEN 0.2 08/06/2016 1134   NITRITE NEGATIVE 07/27/2018 0100   LEUKOCYTESUR TRACE (A) 07/27/2018 0100     Nikkol Pai M.D. [facility] 01/01/2020, 12:47 PM   Call night coverage person covering after 7pm

## 2020-01-01 NOTE — Progress Notes (Signed)
Patient has refused all medications since start of shift, including medication she was previously taken at home. She said that she wanted to be seen by the doctor before resuming any medication. Her BP has been elevated, she has refused all BP meds.

## 2020-01-01 NOTE — Progress Notes (Signed)
On assessment RN noted blood tinge urine in canister. Per patient, she started her period yesterday. Pharmacy and MD aware and heparin continued at this time.

## 2020-01-02 DIAGNOSIS — N179 Acute kidney failure, unspecified: Secondary | ICD-10-CM | POA: Diagnosis not present

## 2020-01-02 DIAGNOSIS — Z419 Encounter for procedure for purposes other than remedying health state, unspecified: Secondary | ICD-10-CM | POA: Diagnosis not present

## 2020-01-02 DIAGNOSIS — I10 Essential (primary) hypertension: Secondary | ICD-10-CM

## 2020-01-02 DIAGNOSIS — I2699 Other pulmonary embolism without acute cor pulmonale: Secondary | ICD-10-CM | POA: Diagnosis not present

## 2020-01-02 DIAGNOSIS — I2609 Other pulmonary embolism with acute cor pulmonale: Secondary | ICD-10-CM | POA: Diagnosis not present

## 2020-01-02 DIAGNOSIS — J4520 Mild intermittent asthma, uncomplicated: Secondary | ICD-10-CM | POA: Diagnosis not present

## 2020-01-02 LAB — CARDIOLIPIN ANTIBODIES, IGG, IGM, IGA
Anticardiolipin IgA: <9 APL U/mL (ref 0–11)
Anticardiolipin IgG: 12 GPL U/mL (ref 0–14)
Anticardiolipin IgM: 9 MPL U/mL (ref 0–12)

## 2020-01-02 LAB — BASIC METABOLIC PANEL
Anion gap: 7 (ref 5–15)
BUN: 16 mg/dL (ref 6–20)
CO2: 25 mmol/L (ref 22–32)
Calcium: 8.8 mg/dL — ABNORMAL LOW (ref 8.9–10.3)
Chloride: 109 mmol/L (ref 98–111)
Creatinine, Ser: 1.26 mg/dL — ABNORMAL HIGH (ref 0.44–1.00)
GFR calc Af Amer: 59 mL/min — ABNORMAL LOW (ref >60)
GFR calc non Af Amer: 51 mL/min — ABNORMAL LOW (ref >60)
Glucose, Bld: 97 mg/dL (ref 70–99)
Potassium: 3.6 mmol/L (ref 3.5–5.1)
Sodium: 141 mmol/L (ref 135–145)

## 2020-01-02 LAB — CBC
HCT: 36.3 % (ref 36.0–46.0)
Hemoglobin: 11.5 g/dL — ABNORMAL LOW (ref 12.0–15.0)
MCH: 27.3 pg (ref 26.0–34.0)
MCHC: 31.7 g/dL (ref 30.0–36.0)
MCV: 86 fL (ref 80.0–100.0)
Platelets: 231 10*3/uL (ref 150–400)
RBC: 4.22 MIL/uL (ref 3.87–5.11)
RDW: 13.7 % (ref 11.5–15.5)
WBC: 6.8 10*3/uL (ref 4.0–10.5)
nRBC: 0 % (ref 0.0–0.2)

## 2020-01-02 LAB — BETA-2-GLYCOPROTEIN I ABS, IGG/M/A
Beta-2 Glyco I IgG: <9 GPI IgG units (ref 0–20)
Beta-2-Glycoprotein I IgA: <9 GPI IgA units (ref 0–25)
Beta-2-Glycoprotein I IgM: <9 GPI IgM units (ref 0–32)

## 2020-01-02 LAB — HEPARIN LEVEL (UNFRACTIONATED): Heparin Unfractionated: 0.26 IU/mL — ABNORMAL LOW (ref 0.30–0.70)

## 2020-01-02 LAB — PROTEIN C, TOTAL: Protein C, Total: 74 % (ref 60–150)

## 2020-01-02 MED ORDER — COUMADIN BOOK
Freq: Once | Status: AC
  Filled 2020-01-02: qty 1

## 2020-01-02 MED ORDER — HEPARIN (PORCINE) 25000 UT/250ML-% IV SOLN: 1500.0000 [IU]/h | INTRAVENOUS | Status: DC

## 2020-01-02 MED ORDER — WARFARIN VIDEO: Freq: Once | Status: DC

## 2020-01-02 MED ORDER — WARFARIN SODIUM 5 MG PO TABS
10.0000 mg | ORAL_TABLET | ORAL | Status: AC
  Administered 2020-01-02: 10 mg via ORAL
  Filled 2020-01-02: qty 2

## 2020-01-02 MED ORDER — WARFARIN - PHARMACIST DOSING INPATIENT: Freq: Every day | Status: DC

## 2020-01-02 MED ORDER — ENOXAPARIN SODIUM 150 MG/ML ~~LOC~~ SOLN
150.0000 mg | Freq: Once | SUBCUTANEOUS | Status: AC
  Administered 2020-01-02: 150 mg via SUBCUTANEOUS
  Filled 2020-01-02: qty 1

## 2020-01-02 MED ORDER — CARVEDILOL 3.125 MG PO TABS: 3.1250 mg | ORAL_TABLET | Freq: Two times a day (BID) | ORAL | Status: DC

## 2020-01-02 MED ORDER — CARVEDILOL 3.125 MG PO TABS
3.1250 mg | ORAL_TABLET | Freq: Two times a day (BID) | ORAL | Status: DC
  Administered 2020-01-02 – 2020-01-05 (×8): 3.125 mg via ORAL
  Filled 2020-01-02 (×8): qty 1

## 2020-01-02 MED ORDER — ENOXAPARIN SODIUM 300 MG/3ML IJ SOLN
1.0000 mg/kg | Freq: Two times a day (BID) | INTRAMUSCULAR | Status: DC
  Administered 2020-01-02 – 2020-01-06 (×8): 170 mg via SUBCUTANEOUS
  Filled 2020-01-02 (×8): qty 1.7

## 2020-01-02 NOTE — Progress Notes (Signed)
ANTICOAGULATION CONSULT NOTE - follow up  Pharmacy Consult for IV heparin Indication: pulmonary embolus  Allergies  Allergen Reactions  . Doxycycline Nausea And Vomiting  . Flagyl [Metronidazole Hcl] Hives and Nausea And Vomiting    Patient Measurements: Height: 5\' 1"  (154.9 cm) Weight: (!) 168.3 kg (371 lb 0.6 oz) IBW/kg (Calculated) : 47.8 Heparin Dosing Weight: 91 kg  Vital Signs: Temp: 98.2 F (36.8 C) (08/01 0417) Temp Source: Oral (08/01 0417) BP: 157/105 (08/01 0417) Pulse Rate: 72 (08/01 0417)  Labs: Recent Labs    12/30/19 2034 12/30/19 2157 12/31/19 0227 12/31/19 0227 12/31/19 0530 12/31/19 0535 12/31/19 0803 12/31/19 1338 12/31/19 2037 01/01/20 0224 01/02/20 0430  HGB 13.5   < > 12.2   < >  --   --   --   --   --  11.7* 11.5*  HCT 42.9   < > 39.2  --   --   --   --   --   --  37.7 36.3  PLT 232   < > 227  --   --   --   --   --   --  221 231  HEPARINUNFRC  --   --  0.69   < >  --  0.97*  --    < > 0.35 0.46 0.26*  CREATININE 1.69*  --   --   --  1.61*  --   --   --   --  1.32*  --   TROPONINIHS  --    < > 38*  --   --  31* 27*  --   --   --   --    < > = values in this interval not displayed.    Estimated Creatinine Clearance: 80.7 mL/min (A) (by C-G formula based on SCr of 1.32 mg/dL (H)).   Medical History: Past Medical History:  Diagnosis Date  . Allergies   . Asthma   . Back pain   . Depression    hx, doing ok now  . Edema, lower extremity   . GERD (gastroesophageal reflux disease)   . Headache(784.0)   . Heart murmur   . Hypertension    on meds  . IBS (irritable bowel syndrome)   . Joint pain   . Neuropathy   . Obesity   . Osteoarthritis   . Ovarian cyst   . Prediabetes   . Preterm labor   . Vitamin D deficiency     Medications:  Scheduled:  Infusions:   Assessment: 44 yo female with new PE to start IV heparin per Rx dosing. Not on anticoagulation PTA.  Today, 01/02/20   HL 0.26, subtherapeutic  Per RN, no bleeding  noted or problems with IV line, etc  CBC stable   Goal of Therapy:  Heparin level 0.3-0.7 units/ml Monitor platelets by anticoagulation protocol: Yes   Plan:   Increase IV heparin to 1500 units/hr  Obtain HL 6 hours after rate change   Daily HL/CBC  Monitor for signs and symptoms of bleeding   Royetta Asal, PharmD, BCPS 01/02/2020 5:42 AM

## 2020-01-02 NOTE — Progress Notes (Addendum)
ANTICOAGULATION CONSULT NOTE - follow up  Pharmacy Consult for IV heparin >> warfarin + LMWH Indication: pulmonary embolus  Allergies  Allergen Reactions  . Doxycycline Nausea And Vomiting  . Flagyl [Metronidazole Hcl] Hives and Nausea And Vomiting    Patient Measurements: Height: 5\' 1"  (154.9 cm) Weight: (!) 168.3 kg (371 lb 0.6 oz) IBW/kg (Calculated) : 47.8 Heparin Dosing Weight: 91 kg  Vital Signs: Temp: 98.2 F (36.8 C) (08/01 0417) Temp Source: Oral (08/01 0417) BP: 157/105 (08/01 0417) Pulse Rate: 72 (08/01 0417)  Labs: Recent Labs    12/30/19 2034 12/31/19 0227 12/31/19 0227 12/31/19 0530 12/31/19 0535 12/31/19 0803 12/31/19 1338 12/31/19 2037 01/01/20 0224 01/02/20 0430  HGB  --  12.2   < >  --   --   --   --   --  11.7* 11.5*  HCT  --  39.2  --   --   --   --   --   --  37.7 36.3  PLT  --  227  --   --   --   --   --   --  221 231  HEPARINUNFRC  --  0.69   < >  --  0.97*  --    < > 0.35 0.46 0.26*  CREATININE   < >  --   --  1.61*  --   --   --   --  1.32* 1.26*  TROPONINIHS  --  38*  --   --  31* 27*  --   --   --   --    < > = values in this interval not displayed.    Estimated Creatinine Clearance: 84.6 mL/min (A) (by C-G formula based on SCr of 1.26 mg/dL (H)).   Medications:  Scheduled:  Infusions:   Assessment: 44 yo female with new PE to start IV heparin per Rx dosing. Not on anticoagulation PTA.   Today, 01/02/20   MD requesting recommendation on switching to Xarelto vs Warfarin  Pharmacy recommends warfarin + lovenox bridge over Xarelto in this patient d/t extreme weight, based on the following:  Approval/efficacy studies in Xarelto (and other DOACs) have not included patients >120 kg  Xarelto labeling approves use in this population based on a single-dose pharmacokinetic study in healthy individuals > 120 kg, but no efficacy studies have ever been done for VTE  A small, single-center 2018 study showed similar outcomes between  warfarin, apixaban, and rivaroxaban in patients with Afib and BMI > 40, but again, VTE was not included  The [facility] recommends avoiding DOACs in patients with BMI > 40 based on lack of clinical data  MD discussed with patient >> agreeable to proceed with warfarin/LMWH   Hgb slightly low but stable; some heavier menstrual bleeding noted per RN but has resolved; Plt stable WNL  Heparin level slightly low this AM; rate increased  DDI with warfarin: none noted  Diet ordered; eating 100% of meals  SCr elevated on admission, improved to just above baseline (~1.1)   Goal of Therapy:  INR 2-3 Monitor platelets by anticoagulation protocol: Yes   Plan:   Warfarin 10 mg PO x 1  Lovenox 1 mg/kg q12 hr; continue Lovenox until INR therapeutic for 2 consecutive readings AND at least 5 days  Stop heparin with first dose of Lovenox  Daily INR  CBC q72 hr  Consider checking LMWH levels if prolonged bridging required  Monitor for signs and symptoms of bleeding  Pharmacy to  educate patient on warfarin/Lovenox prior to discharge   Reuel Boom, PharmD, [place] 01/02/2020, 12:23 PM

## 2020-01-02 NOTE — Progress Notes (Signed)
Patient concerned about being able to give herself Lovenox injections at home. Advised her we will instruct her on how to do it. Says she is willing to try but remains very apprehensive. Eulas Post, RN

## 2020-01-02 NOTE — Progress Notes (Signed)
[facility]                                                                              Patient Demographics  Angela [name], is a 44 y.o. female, DOB - 04-03-1976, EZM:[id number]  Admit date - 12/30/2019   Admitting Physician Shela Leff, MD  Outpatient Primary MD for the patient is Ladell Pier, MD  Outpatient specialists:   LOS - 3  days   Medical records reviewed and are as summarized below:    Chief Complaint  Patient presents with  . Shortness of Breath       Brief summary   Patient is a 44 year old female with history of morbid obesity (BMI 68.02), asthma, hypertension, hypothyroidism, GERD, IBS, neuropathy, prediabetes presented with shortness of breath for past 4 days prior to admission.  Denied any fevers.  Stated that she had left-sided chest discomfort 2 days PTA and went to urgent care at that time.  States her legs are always swollen, denied any calf pain, denies any personal history of blood clots.  No recent travel, surgery or OCP use.  States her mother had a blood clot.  Reported that she had some diarrhea on the day of admission after drinking tea which usually upsets her stomach.  Otherwise having no diarrhea.  Chest x-ray showed no active cardiopulmonary disease CT angiogram showed acute bilateral pulmonary embolus with large clot burden and evidence of right heart strain, involving both main pulmonary arteries extending into all lobar and many segmental and subsegmental branches.  Straightening of the intraventricular septum with RV to LV ratio of 2.1.  Assessment & Plan    Principal Problem:   Acute bilateral pulmonary embolism (HCC) with large clot burden and evidence of right heart strain -Likely risk factor sedentary lifestyle due to chronic knee pain with morbid obesity, also reported family history of blood clots in mother -Per EDP, critical care did not feel thrombolytic therapy was needed.  Currently hemodynamically  stable, no tachycardia, hypoxia, not on O2 or hypotension -O2 sats 100% on room air at the time of my examination -Venous Dopplers negative for any DVT -2D echo showed EF of 60-65% with moderate LVH, no regional wall motion abnormality, severely elevated pulmonary artery systolic pressure. -Discussed with pharmacy regarding NOAC's however recommended avoiding NOACs in patients with morbid obesity, BMI> 40, no efficacy studies available for patient's> 120kgs for VTE rx.  Patient's BMI 70.1 -Discussed with the patient, she is agreeable for Lovenox and Coumadin, will start today.  Patient will need Lovenox teaching prior to discharge. -H&H currently stable -Home O2 evaluation, PT OT evaluation to assess for any dyspnea, hypoxia, tachycardia on ambulating.  Active Problems:   HTN (hypertension), benign -BP currently elevated, does not want to take any of the medications, states that she has started the weight loss clinic and her doses were changed -Resumed Coreg 3.125 twice daily, HCTZ 12.5 mg daily.  Heart rate briefly dropped on 7/31 with the 6.25 mg twice daily dosing of Coreg.   -Continue IV hydralazine as needed with parameters -Discussed in detail with the patient regarding risks are hypertension.  She still refuses other  antihypertensive, discussed about amlodipine, hydralazine or increasing the dose of current agents.    AKI (acute kidney injury) ([place]) -Likely due to #1 and decreased renal perfusion, HCTZ -Baseline creatinine 1.1, presented with creatinine of 1.69 -Creatinine improving, 1.2    Asthma -Stable, no wheezing    Hypothyroidism Continue Synthroid, TSH 5.3 in 11/2019   Morbid obesity Estimated body mass index is 70.11 kg/m as calculated from the following:   Height as of this encounter: 5\' 1"  (1.549 m).   Weight as of this encounter: 168.3 kg.  Patient has started weight loss clinic  Code Status: Full code DVT Prophylaxis: IV heparin drip Family Communication:  Discussed all imaging results, lab results, explained to the patient    Disposition Plan:     Status is: Inpatient  Remains inpatient appropriate because:Inpatient level of care appropriate due to severity of illness   Dispo: The patient is from: Home              Anticipated d/c is to: Home              Anticipated d/c date is: 2 days              Patient currently is not medically stable to d/c.  Starting Lovenox and Coumadin today.  Patient will need Lovenox teaching prior to discharge.     Time Spent in minutes   35 minutes  Procedures:  CTA chest  Consultants:   None  Antimicrobials:   Anti-infectives (From admission, onward)   None         Medications  Scheduled Meds: . carvedilol  3.125 mg Oral BID WC  . [START ON 01/03/2020] coumadin book   Does not apply Once  . enoxaparin (LOVENOX) injection  1 mg/kg Subcutaneous BID  . enoxaparin (LOVENOX) injection  150 mg Subcutaneous Once  . gabapentin  300 mg Oral BID  . hydrochlorothiazide  12.5 mg Oral Daily  . levothyroxine  12.5 mcg Oral Q0600  . loratadine  10 mg Oral Daily  . montelukast  10 mg Oral QHS  . rosuvastatin  2.5 mg Oral QHS  . warfarin  10 mg Oral STAT  . [START ON 01/03/2020] warfarin   Does not apply Once  . Warfarin - Pharmacist Dosing Inpatient   Does not apply q1600   Continuous Infusions:  PRN Meds:.acetaminophen **OR** acetaminophen, albuterol, hydrALAZINE      Subjective:   [name] was seen and examined today.  No chest pain, headache, blurry vision.  BP still somewhat elevated.  BP still elevated.  No acute shortness of breath.  Patient denies dizziness,  abdominal pain, N/V/D/C, new weakness, numbess, tingling.  Objective:   Vitals:   01/01/20 1847 01/01/20 2153 01/02/20 0152 01/02/20 0417  BP: (!) 127/93 (!) 152/103 (!) 140/88 (!) 157/105  Pulse: 92 72 76 72  Resp: 18 (!) 24 22 22   Temp: 98 F (36.7 C) 98.4 F (36.9 C) 98.5 F (36.9 C) 98.2 F (36.8 C)  TempSrc:  Oral Oral Oral Oral  SpO2: 91% 100% 100% 93%  Weight:      Height:        Intake/Output Summary (Last 24 hours) at 01/02/2020 1310 Last data filed at 01/02/2020 0700 Gross per 24 hour  Intake 480.95 ml  Output 2360 ml  Net -1879.05 ml     Wt Readings from Last 3 Encounters:  12/31/19 (!) 168.3 kg  12/23/19 (!) 163.3 kg  12/09/19 (!) 163.3 kg   Physical Exam  General: Alert and oriented x 3, NAD  Cardiovascular: S1 S2 clear, RRR. No pedal edema b/l  Respiratory: CTAB, no wheezing, rales or rhonchi  Gastrointestinal: Obese, soft, nontender, nondistended, NBS  Ext: no pedal edema bilaterally  Neuro: no new deficits  Musculoskeletal: No cyanosis, clubbing  Skin: No rashes  Psych: Normal affect and demeanor, alert and oriented x3    Data Reviewed:  I have personally reviewed following labs and imaging studies  Micro Results Recent Results (from the past 240 hour(s))  SARS CORONAVIRUS 2 (TAT 6-24 HRS) Nasopharyngeal Nasopharyngeal Swab     Status: None   Collection Time: 12/28/19  5:27 PM   Specimen: Nasopharyngeal Swab  Result Value Ref Range Status   SARS Coronavirus 2 NEGATIVE NEGATIVE Final    Comment: (NOTE) SARS-CoV-2 target nucleic acids are NOT DETECTED.  The SARS-CoV-2 RNA is generally detectable in upper and lower respiratory specimens during the acute phase of infection. Negative results do not preclude SARS-CoV-2 infection, do not rule out co-infections with other pathogens, and should not be used as the sole basis for treatment or other patient management decisions. Negative results must be combined with clinical observations, patient history, and epidemiological information. The expected result is Negative.  Fact Sheet for Patients: SugarRoll.be  Fact Sheet for Healthcare Providers: https://www.woods-mathews.com/  This test is not yet approved or cleared by the [place] FDA and  has been authorized  for detection and/or diagnosis of SARS-CoV-2 by FDA under an Emergency Use Authorization (EUA). This EUA will remain  in effect (meaning this test can be used) for the duration of the COVID-19 declaration under Se ction 564(b)(1) of the Act, 21 U.S.C. section 360bbb-3(b)(1), unless the authorization is terminated or revoked sooner.  Performed at [facility], [place] [address], [place], [place] [address]   SARS Coronavirus 2 by RT PCR (hospital order, performed in [facility] hospital lab) Nasopharyngeal Nasopharyngeal Swab     Status: None   Collection Time: 12/30/19 11:31 PM   Specimen: Nasopharyngeal Swab  Result Value Ref Range Status   SARS Coronavirus 2 NEGATIVE NEGATIVE Final    Comment: (NOTE) SARS-CoV-2 target nucleic acids are NOT DETECTED.  The SARS-CoV-2 RNA is generally detectable in upper and lower respiratory specimens during the acute phase of infection. The lowest concentration of SARS-CoV-2 viral copies this assay can detect is 250 copies / mL. A negative result does not preclude SARS-CoV-2 infection and should not be used as the sole basis for treatment or other patient management decisions.  A negative result may occur with improper specimen collection / handling, submission of specimen other than nasopharyngeal swab, presence of viral mutation(s) within the areas targeted by this assay, and inadequate number of viral copies (<250 copies / mL). A negative result must be combined with clinical observations, patient history, and epidemiological information.  Fact Sheet for Patients:   StrictlyIdeas.no  Fact Sheet for Healthcare Providers: BankingDealers.co.za  This test is not yet approved or  cleared by the [place] FDA and has been authorized for detection and/or diagnosis of SARS-CoV-2 by FDA under an Emergency Use Authorization (EUA).  This EUA will remain in effect (meaning this test can be used) for  the duration of the COVID-19 declaration under Section 564(b)(1) of the Act, 21 U.S.C. section 360bbb-3(b)(1), unless the authorization is terminated or revoked sooner.  Performed at [facility], [place] [address], [place], [place] [address]   MRSA PCR Screening     Status: None  Collection Time: 12/31/19  1:02 AM   Specimen: Nasopharyngeal  Result Value Ref Range Status   MRSA by PCR NEGATIVE NEGATIVE Final    Comment:        The GeneXpert MRSA Assay (FDA approved for NASAL specimens only), is one component of a comprehensive MRSA colonization surveillance program. It is not intended to diagnose MRSA infection nor to guide or monitor treatment for MRSA infections. Performed at [facility], [place] [address] [place], [place] [address]     Radiology Reports DG Chest 2 View  Result Date: 12/30/2019 CLINICAL DATA:  Shortness of breath. EXAM: CHEST - 2 VIEW COMPARISON:  December 28, 2019. FINDINGS: Stable cardiomediastinal silhouette. No pneumothorax or pleural effusion is noted. Both lungs are clear. The visualized skeletal structures are unremarkable. IMPRESSION: No active cardiopulmonary disease. Electronically Signed   By: Marijo Conception M.D.   On: 12/30/2019 20:50   DG Chest 2 View  Result Date: 12/28/2019 CLINICAL DATA:  44 year old female with history of cough and left lower chest pain. EXAM: CHEST - 2 VIEW COMPARISON:  Chest x-ray 07/26/2018. FINDINGS: Lung volumes are normal. No consolidative airspace disease. No pleural effusions. No pneumothorax. No pulmonary nodule or mass noted. Pulmonary vasculature is normal. Heart size is mildly enlarged. Upper mediastinal contours are within normal limits. IMPRESSION: 1. No radiographic evidence of acute cardiopulmonary disease. 2. Mild cardiomegaly. Electronically Signed   By: Vinnie Langton M.D.   On: 12/28/2019 19:10   CT Angio Chest PE W and/or Wo Contrast  Result Date: 12/30/2019 CLINICAL  DATA:  Shortness of breath for 4 days. EXAM: CT ANGIOGRAPHY CHEST WITH CONTRAST TECHNIQUE: Multidetector CT imaging of the chest was performed using the standard protocol during bolus administration of intravenous contrast. Multiplanar CT image reconstructions and MIPs were obtained to evaluate the vascular anatomy. CONTRAST:  24mL OMNIPAQUE IOHEXOL 350 MG/ML SOLN COMPARISON:  Radiograph earlier this day. FINDINGS: Cardiovascular: Examination is positive for acute pulmonary embolus with filling defects within the bilateral main pulmonary arteries extending into all lobar, many segmental and subsegmental branches. Thromboembolic burden is large. There is straightening of the intraventricular septum with RV to LV ratio of 2.1. Minimal contrast refluxes into the hepatic veins and IVC. The thoracic aorta is normal in caliber no evidence of aortic dissection. Left vertebral artery arises directly from the thoracic aorta. No pericardial effusion. Mediastinum/Nodes: No enlarged mediastinal or hilar lymph nodes. Decompressed esophagus. No obvious thyroid nodule. Lungs/Pleura: No focal airspace disease. No evidence of pulmonary infarct. No pleural fluid. No findings of pulmonary edema. Upper Abdomen: Prominent liver partially included. Musculoskeletal: There are no acute or suspicious osseous abnormalities. Review of the MIP images confirms the above findings. IMPRESSION: 1. Examination is positive for acute bilateral pulmonary embolus with large clot burden and evidence of right heart strain. Emboli involve both main pulmonary arteries extending into all lobar and many segmental and subsegmental branches. Straightening of the intraventricular septum with RV to LV ratio of 2.1. Minimal contrast refluxes into the hepatic veins and IVC. 2. No pulmonary infarct. Critical Value/emergent results were called by telephone at the time of interpretation on 12/30/2019 at 11:00 pm to Dr Veryl Speak , who verbally acknowledged these  results. Electronically Signed   By: Keith Rake M.D.   On: 12/30/2019 23:01   ECHOCARDIOGRAM COMPLETE  Result Date: 12/31/2019    ECHOCARDIOGRAM REPORT   Patient Name:   BELISA EICHHOLZ Date of Exam: 12/31/2019 Medical Rec #:  [id number]  Height:       61.0 in Accession #:    [id number]       Weight:       371.0 lb Date of Birth:  02/27/1976        BSA:          2.456 m Patient Age:    8 years         BP:           146/113 mmHg Patient Gender: F                HR:           90 bpm. Exam Location:  Inpatient Procedure: 2D Echo Indications:    Pulmonary Embolism  History:        Patient has no prior history of Echocardiogram examinations.                 Signs/Symptoms:Murmur; Risk Factors:Hypertension.  Sonographer:    Mikki Santee RDCS (AE) Referring Phys: [id number] [place]  1. Left ventricular ejection fraction, by estimation, is 60 to 65%. The left ventricle has normal function. The left ventricle has no regional wall motion abnormalities. There is moderate left ventricular hypertrophy and severe basal hypertrophy.  2. Right ventricular systolic function is normal. The right ventricular size is normal. There is severely elevated pulmonary artery systolic pressure. The estimated right ventricular systolic pressure is 60.1 mmHg.  3. The mitral valve is normal in structure. No evidence of mitral valve regurgitation. No evidence of mitral stenosis.  4. Tricuspid valve regurgitation is mild to moderate.  5. The aortic valve is normal in structure. Aortic valve regurgitation is not visualized. No aortic stenosis is present.  6. The inferior vena cava is dilated in size with >50% respiratory variability, suggesting right atrial pressure of 8 mmHg. FINDINGS  Left Ventricle: Left ventricular ejection fraction, by estimation, is 60 to 65%. The left ventricle has normal function. The left ventricle has no regional wall motion abnormalities. The left ventricular internal cavity size  was normal in size. There is  moderate left ventricular hypertrophy. Left ventricular diastolic parameters are consistent with Grade I diastolic dysfunction (impaired relaxation). Normal left ventricular filling pressure. Right Ventricle: The right ventricular size is normal. No increase in right ventricular wall thickness. Right ventricular systolic function is normal. There is severely elevated pulmonary artery systolic pressure. The tricuspid regurgitant velocity is 3.73 m/s, and with an assumed right atrial pressure of 8 mmHg, the estimated right ventricular systolic pressure is 09.3 mmHg. Left Atrium: Left atrial size was normal in size. Right Atrium: Right atrial size was normal in size. Pericardium: There is no evidence of pericardial effusion. Mitral Valve: The mitral valve is normal in structure. Normal mobility of the mitral valve leaflets. Moderate mitral annular calcification. No evidence of mitral valve regurgitation. No evidence of mitral valve stenosis. Tricuspid Valve: The tricuspid valve is normal in structure. Tricuspid valve regurgitation is mild to moderate. No evidence of tricuspid stenosis. Aortic Valve: The aortic valve is normal in structure. Aortic valve regurgitation is not visualized. No aortic stenosis is present. Pulmonic Valve: The pulmonic valve was normal in structure. Pulmonic valve regurgitation is trivial. No evidence of pulmonic stenosis. Aorta: The aortic root is normal in size and structure. Venous: The inferior vena cava is dilated in size with greater than 50% respiratory variability, suggesting right atrial pressure of 8 mmHg. IAS/Shunts: No atrial level shunt detected by color flow Doppler.  LEFT VENTRICLE PLAX 2D  LVIDd:         3.50 cm  Diastology LVIDs:         2.20 cm  LV e' lateral:   6.42 cm/s LV PW:         1.40 cm  LV E/e' lateral: 10.1 LV IVS:        1.70 cm  LV e' medial:    7.40 cm/s LVOT diam:     2.10 cm  LV E/e' medial:  8.8 LV SV:         40 LV SV Index:   16  LVOT Area:     3.46 cm  RIGHT VENTRICLE TAPSE (M-mode): 2.1 cm LEFT ATRIUM             Index       RIGHT ATRIUM           Index LA diam:        2.90 cm 1.18 cm/m  RA Area:     20.30 cm LA Vol (A2C):   39.4 ml 16.04 ml/m RA Volume:   56.70 ml  23.09 ml/m LA Vol (A4C):   24.3 ml 9.89 ml/m LA Biplane Vol: 31.5 ml 12.83 ml/m  AORTIC VALVE LVOT Vmax:   67.90 cm/s LVOT Vmean:  48.900 cm/s LVOT VTI:    0.116 m  AORTA Ao Root diam: 2.90 cm MITRAL VALVE               TRICUSPID VALVE MV Area (PHT): 2.93 cm    TR Peak grad:   55.7 mmHg MV Decel Time: 259 msec    TR Vmax:        373.00 cm/s MV E velocity: 65.10 cm/s MV A velocity: 75.00 cm/s  SHUNTS MV E/A ratio:  0.87        Systemic VTI:  0.12 m                            Systemic Diam: 2.10 cm Fransico Him MD Electronically signed by Fransico Him MD Signature Date/Time: 12/31/2019/11:17:22 AM    Final    VAS [place] LOWER EXTREMITY VENOUS (DVT)  Result Date: 01/01/2020  Lower Venous DVTStudy Indications: Pulmonary embolism.  Limitations: Body habitus and poor ultrasound/tissue interface. Comparison Study: no prior Performing Technologist: Abram Sander RVS  Examination Guidelines: A complete evaluation includes B-mode imaging, spectral Doppler, color Doppler, and power Doppler as needed of all accessible portions of each vessel. Bilateral testing is considered an integral part of a complete examination. Limited examinations for reoccurring indications may be performed as noted. The reflux portion of the exam is performed with the patient in reverse Trendelenburg.  +---------+---------------+---------+-----------+----------+--------------+ RIGHT    CompressibilityPhasicitySpontaneityPropertiesThrombus Aging +---------+---------------+---------+-----------+----------+--------------+ CFV      Full           Yes      Yes                                 +---------+---------------+---------+-----------+----------+--------------+ SFJ      Full                                                         +---------+---------------+---------+-----------+----------+--------------+ FV Prox  Full                                                        +---------+---------------+---------+-----------+----------+--------------+  FV Mid   Full                                                        +---------+---------------+---------+-----------+----------+--------------+ FV DistalFull                                                        +---------+---------------+---------+-----------+----------+--------------+ PFV      Full                                                        +---------+---------------+---------+-----------+----------+--------------+ POP                                                   Not visualized +---------+---------------+---------+-----------+----------+--------------+ PTV                                                   Not visualized +---------+---------------+---------+-----------+----------+--------------+ PERO                                                  Not visualized +---------+---------------+---------+-----------+----------+--------------+   +---------+---------------+---------+-----------+----------+--------------+ LEFT     CompressibilityPhasicitySpontaneityPropertiesThrombus Aging +---------+---------------+---------+-----------+----------+--------------+ CFV      Full           Yes      Yes                                 +---------+---------------+---------+-----------+----------+--------------+ SFJ      Full                                                        +---------+---------------+---------+-----------+----------+--------------+ FV Prox  Full                                                        +---------+---------------+---------+-----------+----------+--------------+ FV Mid   Full                                                         +---------+---------------+---------+-----------+----------+--------------+ FV DistalFull                                                        +---------+---------------+---------+-----------+----------+--------------+  PFV      Full                                                        +---------+---------------+---------+-----------+----------+--------------+ POP                                                   Not visualized +---------+---------------+---------+-----------+----------+--------------+ PTV                                                   Not visualized +---------+---------------+---------+-----------+----------+--------------+ PERO                                                  Not visualized +---------+---------------+---------+-----------+----------+--------------+     Summary: RIGHT: - There is no evidence of deep vein thrombosis in the lower extremity. However, portions of this examination were limited- see technologist comments above.  LEFT: - There is no evidence of deep vein thrombosis in the lower extremity. However, portions of this examination were limited- see technologist comments above.  *See table(s) above for measurements and observations. Electronically signed by Deitra Mayo MD on 01/01/2020 at 4:19:34 AM.    Final     Lab Data:  CBC: Recent Labs  Lab 12/30/19 2034 12/31/19 0227 01/01/20 0224 01/02/20 0430  WBC 7.8 9.1 7.5 6.8  NEUTROABS 5.2  --   --   --   HGB 13.5 12.2 11.7* 11.5*  HCT 42.9 39.2 37.7 36.3  MCV 87.7 88.1 88.1 86.0  PLT 232 227 221 431   Basic Metabolic Panel: Recent Labs  Lab 12/30/19 2034 12/31/19 0530 01/01/20 0224 01/02/20 0430  NA 139 137 140 141  K 4.1 3.7 3.5 3.6  CL 107 107 109 109  CO2 23 20* 22 25  GLUCOSE 127* 108* 100* 97  BUN 26* 25* 25* 16  CREATININE 1.69* 1.61* 1.32* 1.26*  CALCIUM 9.2 8.7* 8.7* 8.8*   GFR: Estimated Creatinine Clearance: 84.6 mL/min (A) (by C-G  formula based on SCr of 1.26 mg/dL (H)). Liver Function Tests: Recent Labs  Lab 12/30/19 2034  AST 35  ALT 40  ALKPHOS 123  BILITOT 0.5  PROT 8.2*  ALBUMIN 4.0   No results for input(s): LIPASE, AMYLASE in the last 168 hours. No results for input(s): AMMONIA in the last 168 hours. Coagulation Profile: No results for input(s): INR, PROTIME in the last 168 hours. Cardiac Enzymes: No results for input(s): CKTOTAL, CKMB, CKMBINDEX, TROPONINI in the last 168 hours. BNP (last 3 results) No results for input(s): PROBNP in the last 8760 hours. HbA1C: No results for input(s): HGBA1C in the last 72 hours. CBG: No results for input(s): GLUCAP in the last 168 hours. Lipid Profile: No results for input(s): CHOL, HDL, LDLCALC, TRIG, CHOLHDL, LDLDIRECT in the last 72 hours. Thyroid Function Tests: No results for input(s): TSH, T4TOTAL, FREET4, T3FREE, THYROIDAB in the last 72 hours. Anemia Panel:  No results for input(s): VITAMINB12, FOLATE, FERRITIN, TIBC, IRON, RETICCTPCT in the last 72 hours. Urine analysis:    Component Value Date/Time   COLORURINE YELLOW 07/27/2018 0100   APPEARANCEUR CLOUDY (A) 07/27/2018 0100   LABSPEC 1.021 07/27/2018 0100   PHURINE 5.0 07/27/2018 0100   GLUCOSEU NEGATIVE 07/27/2018 0100   HGBUR NEGATIVE 07/27/2018 0100   BILIRUBINUR NEGATIVE 07/27/2018 0100   KETONESUR NEGATIVE 07/27/2018 0100   PROTEINUR NEGATIVE 07/27/2018 0100   UROBILINOGEN 0.2 08/06/2016 1134   NITRITE NEGATIVE 07/27/2018 0100   LEUKOCYTESUR TRACE (A) 07/27/2018 0100     Marisella Puccio M.D. [facility] 01/02/2020, 1:10 PM   Call night coverage person covering after 7pm

## 2020-01-02 NOTE — Plan of Care (Signed)

## 2020-01-03 ENCOUNTER — Ambulatory Visit (INDEPENDENT_AMBULATORY_CARE_PROVIDER_SITE_OTHER): Payer: Medicaid Other | Admitting: Family Medicine

## 2020-01-03 DIAGNOSIS — J4520 Mild intermittent asthma, uncomplicated: Secondary | ICD-10-CM | POA: Diagnosis not present

## 2020-01-03 DIAGNOSIS — N179 Acute kidney failure, unspecified: Secondary | ICD-10-CM | POA: Diagnosis not present

## 2020-01-03 DIAGNOSIS — E039 Hypothyroidism, unspecified: Secondary | ICD-10-CM

## 2020-01-03 DIAGNOSIS — I10 Essential (primary) hypertension: Secondary | ICD-10-CM | POA: Diagnosis not present

## 2020-01-03 DIAGNOSIS — I2699 Other pulmonary embolism without acute cor pulmonale: Secondary | ICD-10-CM | POA: Diagnosis not present

## 2020-01-03 LAB — PROTIME-INR
INR: 1.1 (ref 0.8–1.2)
Prothrombin Time: 14 seconds (ref 11.4–15.2)

## 2020-01-03 LAB — BASIC METABOLIC PANEL
Anion gap: 9 (ref 5–15)
BUN: 12 mg/dL (ref 6–20)
CO2: 26 mmol/L (ref 22–32)
Calcium: 9.1 mg/dL (ref 8.9–10.3)
Chloride: 106 mmol/L (ref 98–111)
Creatinine, Ser: 1.28 mg/dL — ABNORMAL HIGH (ref 0.44–1.00)
GFR calc Af Amer: 58 mL/min — ABNORMAL LOW (ref >60)
GFR calc non Af Amer: 50 mL/min — ABNORMAL LOW (ref >60)
Glucose, Bld: 99 mg/dL (ref 70–99)
Potassium: 3.4 mmol/L — ABNORMAL LOW (ref 3.5–5.1)
Sodium: 141 mmol/L (ref 135–145)

## 2020-01-03 LAB — PROTEIN C ACTIVITY: Protein C Activity: 96 % (ref 73–180)

## 2020-01-03 LAB — PROTEIN S, TOTAL: Protein S Ag, Total: 81 % (ref 60–150)

## 2020-01-03 LAB — PROTEIN S ACTIVITY: Protein S Activity: 88 % (ref 63–140)

## 2020-01-03 MED ORDER — CLONIDINE HCL 0.1 MG PO TABS
0.0500 mg | ORAL_TABLET | Freq: Two times a day (BID) | ORAL | Status: DC | PRN
  Administered 2020-01-04: 0.05 mg via ORAL
  Filled 2020-01-03: qty 1

## 2020-01-03 MED ORDER — AMLODIPINE BESYLATE 5 MG PO TABS
5.0000 mg | ORAL_TABLET | Freq: Every day | ORAL | Status: DC
  Administered 2020-01-03 – 2020-01-04 (×2): 5 mg via ORAL
  Filled 2020-01-03 (×2): qty 1

## 2020-01-03 MED ORDER — HYDRALAZINE HCL 20 MG/ML IJ SOLN: 5.0000 mg | Freq: Four times a day (QID) | INTRAMUSCULAR | Status: DC | PRN

## 2020-01-03 MED ORDER — WARFARIN SODIUM 5 MG PO TABS
10.0000 mg | ORAL_TABLET | Freq: Once | ORAL | Status: AC
  Administered 2020-01-03: 10 mg via ORAL
  Filled 2020-01-03: qty 2

## 2020-01-03 NOTE — Evaluation (Signed)
Physical Therapy [facility] Evaluation Patient Details Name: Angela [name] MRN: [id number] DOB: 1976/02/12 Today's Date: 01/03/2020   History of Present Illness  44 year old female with history of morbid obesity (BMI 68.02), asthma, hypertension, hypothyroidism, GERD, IBS, neuropathy, prediabetes presented with shortness of breath and diagnosed with Acute bilateral pulmonary embolism with large clot burden and evidence of right heart strain  Clinical Impression  Patient evaluated by Physical Therapy with no further acute PT needs identified. All education has been completed and the patient has no further questions.  Pt used bathroom independently and then ambulated short distance in hallway.  Pt reports dyspnea present however about her baseline.  SpO2 monitored and remained in upper 90s during session. See below for any follow-up Physical Therapy or equipment needs. PT is signing off. Thank you for this referral.     Follow Up Recommendations No PT follow up    Equipment Recommendations  None recommended by PT    Recommendations for Other Services       Precautions / Restrictions Precautions Precautions: None      Mobility  Bed Mobility Overal bed mobility: Modified Independent                Transfers Overall transfer level: Modified independent                  Ambulation/Gait Ambulation/Gait assistance: Supervision Gait Distance (Feet): 80 Feet Assistive device: None Gait Pattern/deviations: Step-through pattern;Decreased stride length     General Gait Details: slow but steady pace, SPO2 96-98% on room air, pt reports dyspnea however no worse then baseline  Stairs            Wheelchair Mobility    Modified Rankin (Stroke Patients Only)       Balance                                             Pertinent Vitals/Pain Pain Assessment: Faces Faces Pain Scale: Hurts little more Pain Location: low back Pain Descriptors /  Indicators: Grimacing;Sore Pain Intervention(s): Repositioned;Monitored during session    Home Living Family/patient expects to be discharged to:: Private residence Living Arrangements: Other relatives     Home Access: Level entry     Home Layout: One [place]: None      Prior Function Level of Independence: Independent               Hand Dominance        Extremity/Trunk Assessment        Lower Extremity Assessment Lower Extremity Assessment: Overall WFL for tasks assessed       Communication   Communication: No difficulties  Cognition Arousal/Alertness: Awake/alert Behavior During Therapy: WFL for tasks assessed/performed Overall Cognitive Status: Within Functional Limits for tasks assessed                                        General Comments      Exercises     Assessment/Plan    PT Assessment Patent does not need any further PT services  PT Problem List         PT Treatment Interventions      PT Goals (Current goals can be found in the Care Plan section)  Acute Rehab PT Goals PT  Goal Formulation: All assessment and education complete, DC therapy    Frequency     Barriers to discharge        Co-evaluation               AM-PAC PT "6 Clicks" Mobility  Outcome Measure Help needed turning from your back to your side while in a flat bed without using bedrails?: None Help needed moving from lying on your back to sitting on the side of a flat bed without using bedrails?: None Help needed moving to and from a bed to a chair (including a wheelchair)?: None Help needed standing up from a chair using your arms (e.g., wheelchair or bedside chair)?: None Help needed to walk in hospital room?: A Little Help needed climbing 3-5 steps with a railing? : A Little 6 Click Score: 22    End of Session   Activity Tolerance: Patient tolerated treatment well Patient left: in chair;with call bell/phone within reach    PT Visit Diagnosis: Difficulty in walking, not elsewhere classified (R26.2)    Time: 1610-9604 PT Time Calculation (min) (ACUTE ONLY): 38 min At least 15 minutes pt was in bathroom  Charges:   PT Evaluation $PT Eval Low Complexity: 1 Low        Kati PT, DPT Acute Rehabilitation Services Pager: [phone] Office: [phone]  Mako Pelfrey,KATHrine E 01/03/2020, 11:40 AM

## 2020-01-03 NOTE — Progress Notes (Signed)
PROGRESS NOTE    Angela [name]  GDJ:[id number]  DOB: [birth year]  PCP: Ladell Pier, MD Admit date:12/30/2019 Chief compliant: shortness of breath Hospital course: 44 year old female with history of morbid obesity (BMI 68.02), asthma, hypertension, hypothyroidism, GERD, IBS, neuropathy, prediabetes presented with shortness of breath for 4 days prior to admission.  Denied any fevers. Apparently, she had left-sided chest discomfort 2 days PTA and went to urgent care at that time.Reported that she had some diarrhea on the day of admission after drinking tea which usually upsets her stomach.  Otherwise having no diarrhea. Her legs are always swollen, denied any calf pain or personal history of blood clots.  No recent travel, surgery or OCP use.  States her mother had a blood clot.   ED work up: CT angiogram showed acute bilateral pulmonary embolus with large clot burden and evidence of right heart strain, involving both main pulmonary arteries extending into all lobar and many segmental and subsegmental branches. Straightening of the intraventricular septum with RV to LV ratio of 2.1.Per ED physician discussion with critical care, not candidate for thrombolytic therapy as hemodynamically stable and able to maintain O2 sat on room air.  Venous Doppler negative for DVT.  Patient admitted to [facility] for further evaluation and management.    Subjective:  Patient denies any acute complaints.  Walked with nurse tech today and able to maintain sats greater than 90%.  She is concerned about uncontrolled blood pressure and asking if she could be resumed on clonidine which helped her better in the past.  Reports headache with hydralazine use.   Objective: Vitals:   01/02/20 2023 01/02/20 2048 01/03/20 0557 01/03/20 0622  BP: (!) 186/113 (!) 156/96 (!) 155/109 (!) 178/108  Pulse: 77 67 75 72  Resp: (!) 24  18 18   Temp: 99.5 F (37.5 C)  98.2 F (36.8 C) 98.1 F (36.7 C)  TempSrc: Oral   Oral  SpO2:  99%  94% 99%  Weight:      Height:        Intake/Output Summary (Last 24 hours) at 01/03/2020 1049 Last data filed at 01/02/2020 1900 Gross per 24 hour  Intake 720 ml  Output 550 ml  Net 170 ml   Filed Weights   12/30/19 2028 12/31/19 0015  Weight: (!) 163.3 kg (!) 168.3 kg    Physical Examination: General: Obese female, no acute distress noted Head ENT: Atraumatic normocephalic, PERRLA, neck supple Heart: S1-S2 heard, regular rate and rhythm, no murmurs.  No leg edema noted Lungs: Equal air entry bilaterally, no rhonchi or rales on exam, no accessory muscle use Abdomen: Bowel sounds heard, soft, nontender, nondistended. No organomegaly.  No CVA tenderness Extremities: No pedal edema.  No cyanosis or clubbing. Neurological: Awake alert oriented x3, no focal weakness or numbness, strength and sensations to crude touch intact Skin: No wounds or rashes.  Data Reviewed: I have personally reviewed following labs and imaging studies  CBC: Recent Labs  Lab 12/30/19 2034 12/31/19 0227 01/01/20 0224 01/02/20 0430  WBC 7.8 9.1 7.5 6.8  NEUTROABS 5.2  --   --   --   HGB 13.5 12.2 11.7* 11.5*  HCT 42.9 39.2 37.7 36.3  MCV 87.7 88.1 88.1 86.0  PLT 232 227 221 622   Basic Metabolic Panel: Recent Labs  Lab 12/30/19 2034 12/31/19 0530 01/01/20 0224 01/02/20 0430 01/03/20 0448  NA 139 137 140 141 141  K 4.1 3.7 3.5 3.6 3.4*  CL 107 107 109 109 106  CO2 23 20* 22 25 26   GLUCOSE 127* 108* 100* 97 99  BUN 26* 25* 25* 16 12  CREATININE 1.69* 1.61* 1.32* 1.26* 1.28*  CALCIUM 9.2 8.7* 8.7* 8.8* 9.1   GFR: Estimated Creatinine Clearance: 83.2 mL/min (A) (by C-G formula based on SCr of 1.28 mg/dL (H)). Liver Function Tests: Recent Labs  Lab 12/30/19 2034  AST 35  ALT 40  ALKPHOS 123  BILITOT 0.5  PROT 8.2*  ALBUMIN 4.0   No results for input(s): LIPASE, AMYLASE in the last 168 hours. No results for input(s): AMMONIA in the last 168 hours. Coagulation Profile: Recent  Labs  Lab 01/03/20 0823  INR 1.1   Cardiac Enzymes: No results for input(s): CKTOTAL, CKMB, CKMBINDEX, TROPONINI in the last 168 hours. BNP (last 3 results) No results for input(s): PROBNP in the last 8760 hours. HbA1C: No results for input(s): HGBA1C in the last 72 hours. CBG: No results for input(s): GLUCAP in the last 168 hours. Lipid Profile: No results for input(s): CHOL, HDL, LDLCALC, TRIG, CHOLHDL, LDLDIRECT in the last 72 hours. Thyroid Function Tests: No results for input(s): TSH, T4TOTAL, FREET4, T3FREE, THYROIDAB in the last 72 hours. Anemia Panel: No results for input(s): VITAMINB12, FOLATE, FERRITIN, TIBC, IRON, RETICCTPCT in the last 72 hours. Sepsis Labs: No results for input(s): PROCALCITON, LATICACIDVEN in the last 168 hours.  Recent Results (from the past 240 hour(s))  SARS CORONAVIRUS 2 (TAT 6-24 HRS) Nasopharyngeal Nasopharyngeal Swab     Status: None   Collection Time: 12/28/19  5:27 PM   Specimen: Nasopharyngeal Swab  Result Value Ref Range Status   SARS Coronavirus 2 NEGATIVE NEGATIVE Final    Comment: (NOTE) SARS-CoV-2 target nucleic acids are NOT DETECTED.  The SARS-CoV-2 RNA is generally detectable in upper and lower respiratory specimens during the acute phase of infection. Negative results do not preclude SARS-CoV-2 infection, do not rule out co-infections with other pathogens, and should not be used as the sole basis for treatment or other patient management decisions. Negative results must be combined with clinical observations, patient history, and epidemiological information. The expected result is Negative.  Fact Sheet for Patients: SugarRoll.be  Fact Sheet for Healthcare Providers: https://www.woods-mathews.com/  This test is not yet approved or cleared by the [place] FDA and  has been authorized for detection and/or diagnosis of SARS-CoV-2 by FDA under an Emergency Use Authorization (EUA).  This EUA will remain  in effect (meaning this test can be used) for the duration of the COVID-19 declaration under Se ction 564(b)(1) of the Act, 21 U.S.C. section 360bbb-3(b)(1), unless the authorization is terminated or revoked sooner.  Performed at [facility], [place] [address], [place], [place] [address]   SARS Coronavirus 2 by RT PCR (hospital order, performed in [facility] hospital lab) Nasopharyngeal Nasopharyngeal Swab     Status: None   Collection Time: 12/30/19 11:31 PM   Specimen: Nasopharyngeal Swab  Result Value Ref Range Status   SARS Coronavirus 2 NEGATIVE NEGATIVE Final    Comment: (NOTE) SARS-CoV-2 target nucleic acids are NOT DETECTED.  The SARS-CoV-2 RNA is generally detectable in upper and lower respiratory specimens during the acute phase of infection. The lowest concentration of SARS-CoV-2 viral copies this assay can detect is 250 copies / mL. A negative result does not preclude SARS-CoV-2 infection and should not be used as the sole basis for treatment or other patient management decisions.  A negative result may occur with improper specimen collection / handling, submission of specimen  other than nasopharyngeal swab, presence of viral mutation(s) within the areas targeted by this assay, and inadequate number of viral copies (<250 copies / mL). A negative result must be combined with clinical observations, patient history, and epidemiological information.  Fact Sheet for Patients:   StrictlyIdeas.no  Fact Sheet for Healthcare Providers: BankingDealers.co.za  This test is not yet approved or  cleared by the [place] FDA and has been authorized for detection and/or diagnosis of SARS-CoV-2 by FDA under an Emergency Use Authorization (EUA).  This EUA will remain in effect (meaning this test can be used) for the duration of the COVID-19 declaration under Section 564(b)(1) of the Act, 21 U.S.C. section  360bbb-3(b)(1), unless the authorization is terminated or revoked sooner.  Performed at [facility], [place] [address], [place], [place] [address]   MRSA PCR Screening     Status: None   Collection Time: 12/31/19  1:02 AM   Specimen: Nasopharyngeal  Result Value Ref Range Status   MRSA by PCR NEGATIVE NEGATIVE Final    Comment:        The GeneXpert MRSA Assay (FDA approved for NASAL specimens only), is one component of a comprehensive MRSA colonization surveillance program. It is not intended to diagnose MRSA infection nor to guide or monitor treatment for MRSA infections. Performed at [facility], [place] [address], [place], [place] [address]       Radiology Studies: No results found.    Scheduled Meds: . carvedilol  3.125 mg Oral BID WC  . enoxaparin (LOVENOX) injection  1 mg/kg Subcutaneous BID  . gabapentin  300 mg Oral BID  . hydrochlorothiazide  12.5 mg Oral Daily  . levothyroxine  12.5 mcg Oral Q0600  . loratadine  10 mg Oral Daily  . montelukast  10 mg Oral QHS  . rosuvastatin  2.5 mg Oral QHS  . warfarin   Does not apply Once  . Warfarin - Pharmacist Dosing Inpatient   Does not apply q1600   Continuous Infusions:    Assessment/Plan:  1.  Acute bilateral PE with large clot burden/RV strain: Nonprovoked.  She does have family history of blood clots in mother and may need prolonged anticoagulation.  Initially managed with IV heparin.  Currently on Lovenox and warfarin started on 8/1 per pharmacy recommendation (not a great candidate for DOAC's given extreme weight greater than 120 kg, BMI greater than 40).  Not hypoxic or hypotensive.  Echocardiogram showed preserved EF, moderate LVH, severely elevated pulmonary artery systolic pressure with BNP 1164.Marland Kitchen  H&H currently stable.  Walking desats did not reveal any evidence of hypoxia or home O2 needs.  INR at 1.1, can possibly educate patient regarding Lovenox self administration and  dose Coumadin as outpatient once INR close to 1.5.  Will request nursing to teach patient.  2.  Hypertension:Resumed Coreg 3.125 twice daily, HCTZ 12.5 mg daily.  Heart rate briefly dropped to 40s during sleep on 7/31 with the 6.25 mg twice daily dosing of Coreg.  Will avoid nitrates given complaints of headache, can try low-dose of Norvasc (although can cause headache as well)   Patient reluctant regarding increasing other meds and would like hydralazine transition to clonidine (concern for exacerbating bradycardia with beta plus alpha blockade).  Patient advised outpatient sleep study as she certainly has the body habitus predisposing to OSA.  3.  AKI on CKD stage IIIb: Patient's baseline creatinine is around 1.1 and presented with creatinine 1.69-gradually improved to 1.3 and now at 1.2.  Likely related to diuretic/NSAID use.  Patient was on HCTZ 25 mg and meloxicam 15 g at home.  Pharmacy following for anticoagulant dosing.  4.  Hypothyroidism: Continue Synthroid.  5.  Asthma: Stable, resume home meds  6.  Morbid obesity:Estimated body mass index is 70.11 kg/m as calculated from the following:Height as of this encounter: 5\' 1"  (1.549 m). Weight as of this encounter: 168.3 kg.   Patient has apparently enrolled in weight loss clinic.  DVT prophylaxis: On anticoagulation Code Status: Full code Family / Patient Communication:  Disposition Plan:   Status is: Inpatient  Remains inpatient appropriate because:Inpatient level of care appropriate due to severity of illness   Dispo: The patient is from: Home              Anticipated d/c is to: Home              Anticipated d/c date is: 2 days              Patient currently is not medically stable to d/c.         Time spent: 25 MIN     >50% time spent in discussions with care team and coordination of care.    Guilford Shi, MD [facility] Pager in [place]  If 7PM-7AM, please contact night-coverage [email or web address] 01/03/2020,  10:49 AM

## 2020-01-03 NOTE — Progress Notes (Signed)
RN educated patient in how to properly self-administer Lovenox.  Patient nearly completed the process, then asked RN to do the actual injection.  RN encouraged patient about her being able to self-administer the next scheduled dose.  Patient agreed.

## 2020-01-03 NOTE — Progress Notes (Signed)
ANTICOAGULATION CONSULT NOTE - Follow Up Consult  Pharmacy Consult for warfarin + LMWH Indication: pulmonary embolus  Allergies  Allergen Reactions  . Doxycycline Nausea And Vomiting  . Flagyl [Metronidazole Hcl] Hives and Nausea And Vomiting    Patient Measurements: Height: 5\' 1"  (154.9 cm) Weight: (!) 168.3 kg (371 lb 0.6 oz) IBW/kg (Calculated) : 47.8   Vital Signs: Temp: 98.1 F (36.7 C) (08/02 0622) Temp Source: Oral (08/02 0622) BP: 178/108 (08/02 0622) Pulse Rate: 72 (08/02 0622)  Labs: Recent Labs    12/31/19 2037 01/01/20 0224 01/02/20 0430 01/03/20 0448 01/03/20 0823  HGB  --  11.7* 11.5*  --   --   HCT  --  37.7 36.3  --   --   PLT  --  221 231  --   --   LABPROT  --   --   --   --  14.0  INR  --   --   --   --  1.1  HEPARINUNFRC 0.35 0.46 0.26*  --   --   CREATININE  --  1.32* 1.26* 1.28*  --     Estimated Creatinine Clearance: 83.2 mL/min (A) (by C-G formula based on SCr of 1.28 mg/dL (H)).    Assessment: 44 yo female with new PE, was on heparin and tx to warfarin/enoxaparin  01/03/2020 INR 1.1, as expected after only 1 dose of warfarin CBC (8/21) WNL Education completed today  Goal of Therapy:  INR 2-3   Plan:   Warfarin 10mg  po x1 today  Lovenox 1 mg/kg q12 hr; continue Lovenox until INR therapeutic for 2 consecutive readings AND at least 5 days  Daily INR  Monitor for signs and symptoms of bleeding  Dolly Rias RPh 01/03/2020, 1:39 PM

## 2020-01-03 NOTE — Discharge Instructions (Signed)

## 2020-01-04 DIAGNOSIS — I10 Essential (primary) hypertension: Secondary | ICD-10-CM | POA: Diagnosis not present

## 2020-01-04 DIAGNOSIS — J4520 Mild intermittent asthma, uncomplicated: Secondary | ICD-10-CM | POA: Diagnosis not present

## 2020-01-04 DIAGNOSIS — N179 Acute kidney failure, unspecified: Secondary | ICD-10-CM | POA: Diagnosis not present

## 2020-01-04 DIAGNOSIS — I2699 Other pulmonary embolism without acute cor pulmonale: Secondary | ICD-10-CM | POA: Diagnosis not present

## 2020-01-04 DIAGNOSIS — E039 Hypothyroidism, unspecified: Secondary | ICD-10-CM | POA: Diagnosis not present

## 2020-01-04 LAB — DRVVT CONFIRM: dRVVT Confirm: 1.2 ratio (ref 0.8–1.2)

## 2020-01-04 LAB — LUPUS ANTICOAGULANT PANEL
DRVVT: 55.2 s — ABNORMAL HIGH (ref 0.0–47.0)
PTT Lupus Anticoagulant: 70.6 s — ABNORMAL HIGH (ref 0.0–51.9)

## 2020-01-04 LAB — POTASSIUM: Potassium: 3.5 mmol/L (ref 3.5–5.1)

## 2020-01-04 LAB — FACTOR 5 LEIDEN

## 2020-01-04 LAB — HEXAGONAL PHASE PHOSPHOLIPID: Hexagonal Phase Phospholipid: 3 s (ref 0–11)

## 2020-01-04 LAB — PROTIME-INR
INR: 1.4 — ABNORMAL HIGH (ref 0.8–1.2)
Prothrombin Time: 16.5 seconds — ABNORMAL HIGH (ref 11.4–15.2)

## 2020-01-04 LAB — PTT-LA MIX: PTT-LA Mix: 52.5 s — ABNORMAL HIGH (ref 0.0–48.9)

## 2020-01-04 LAB — DRVVT MIX: dRVVT Mix: 43.3 s — ABNORMAL HIGH (ref 0.0–40.4)

## 2020-01-04 MED ORDER — AMLODIPINE BESYLATE 10 MG PO TABS
10.0000 mg | ORAL_TABLET | Freq: Every day | ORAL | Status: DC
  Administered 2020-01-05 – 2020-01-06 (×2): 10 mg via ORAL
  Filled 2020-01-04 (×2): qty 1

## 2020-01-04 MED ORDER — POTASSIUM CHLORIDE 20 MEQ PO PACK
40.0000 meq | PACK | Freq: Once | ORAL | Status: DC
  Filled 2020-01-04: qty 2

## 2020-01-04 MED ORDER — WARFARIN SODIUM 5 MG PO TABS
10.0000 mg | ORAL_TABLET | Freq: Once | ORAL | Status: AC
  Administered 2020-01-04: 10 mg via ORAL
  Filled 2020-01-04: qty 2

## 2020-01-04 MED ORDER — POTASSIUM CHLORIDE CRYS ER 20 MEQ PO TBCR
40.0000 meq | EXTENDED_RELEASE_TABLET | Freq: Once | ORAL | Status: AC
  Administered 2020-01-04: 40 meq via ORAL
  Filled 2020-01-04: qty 2

## 2020-01-04 MED ORDER — AMLODIPINE BESYLATE 5 MG PO TABS
5.0000 mg | ORAL_TABLET | Freq: Once | ORAL | Status: AC
  Administered 2020-01-04: 5 mg via ORAL
  Filled 2020-01-04: qty 1

## 2020-01-04 MED ORDER — CLONIDINE HCL 0.1 MG PO TABS
0.1000 mg | ORAL_TABLET | Freq: Two times a day (BID) | ORAL | Status: DC | PRN
  Administered 2020-01-04: 0.1 mg via ORAL
  Filled 2020-01-04: qty 1

## 2020-01-04 NOTE — Progress Notes (Signed)
ANTICOAGULATION CONSULT NOTE - Follow Up Consult  Pharmacy Consult for warfarin + LMWH Indication: pulmonary embolus  Allergies  Allergen Reactions  . Doxycycline Nausea And Vomiting  . Flagyl [Metronidazole Hcl] Hives and Nausea And Vomiting    Patient Measurements: Height: 5\' 1"  (154.9 cm) Weight: (!) 168.3 kg (371 lb 0.6 oz) IBW/kg (Calculated) : 47.8   Vital Signs: Temp: 98.3 F (36.8 C) (08/03 0545) Temp Source: Oral (08/03 0545) BP: 173/97 (08/03 0545) Pulse Rate: 83 (08/03 0545)  Labs: Recent Labs    01/02/20 0430 01/03/20 0448 01/03/20 0823 01/04/20 0436  HGB 11.5*  --   --   --   HCT 36.3  --   --   --   PLT 231  --   --   --   LABPROT  --   --  14.0 16.5*  INR  --   --  1.1 1.4*  HEPARINUNFRC 0.26*  --   --   --   CREATININE 1.26* 1.28*  --   --     Estimated Creatinine Clearance: 83.2 mL/min (A) (by C-G formula based on SCr of 1.28 mg/dL (H)).    Assessment: 44 yo female with new PE, was on heparin and tx to warfarin/enoxaparin  01/04/2020 INR 1.4, as expected after only 2 doses of warfarin CBC (8/21) WNL Eating 100% of meals No DI noted  Goal of Therapy:  INR 2-3   Plan:   Warfarin 10mg  po x1 today  Lovenox 1 mg/kg q12 hr; continue Lovenox until INR therapeutic for 2 consecutive readings AND at least 5 days  Daily INR  Monitor for signs and symptoms of bleeding  Dolly Rias RPh 01/04/2020, 8:41 AM

## 2020-01-04 NOTE — Plan of Care (Signed)

## 2020-01-04 NOTE — Progress Notes (Signed)
PROGRESS NOTE    Angela [name]  VOH:[id number]  DOB: [birth year]  PCP: Ladell Pier, MD Admit date:12/30/2019 Chief compliant: shortness of breath Hospital course: 44 year old female with history of morbid obesity (BMI 68.02), asthma, hypertension, hypothyroidism, GERD, IBS, neuropathy, prediabetes presented with shortness of breath for 4 days prior to admission.  Denied any fevers. Apparently, she had left-sided chest discomfort 2 days PTA and went to urgent care at that time.Reported that she had some diarrhea on the day of admission after drinking tea which usually upsets her stomach.  Otherwise having no diarrhea. Her legs are always swollen, denied any calf pain or personal history of blood clots.  No recent travel, surgery or OCP use.  States her mother had a blood clot.   ED work up: CT angiogram showed acute bilateral pulmonary embolus with large clot burden and evidence of right heart strain, involving both main pulmonary arteries extending into all lobar and many segmental and subsegmental branches. Straightening of the intraventricular septum with RV to LV ratio of 2.1.Per ED physician discussion with critical care, not candidate for thrombolytic therapy as hemodynamically stable and able to maintain O2 sat on room air.  Venous Doppler negative for DVT.  Patient admitted to [facility] for further evaluation and management.    Subjective:  Patient denies any acute complaints.  States headache is improved and wishes to go home.  She however has not been able to self administer Lovenox shots.  BP still elevated at systolic 694W.  No further bradycardic episodes reported on telemetry overnight.  Objective: Vitals:   01/04/20 0010 01/04/20 0120 01/04/20 0545 01/04/20 1242  BP: (!) 171/109 (!) 158/109 (!) 173/97 (!) 152/93  Pulse: 74 74 83 81  Resp: 18  18 16   Temp: 99.1 F (37.3 C)  98.3 F (36.8 C) 98.4 F (36.9 C)  TempSrc: Oral  Oral Oral  SpO2: 100%  95% 94%  Weight:        Height:        Intake/Output Summary (Last 24 hours) at 01/04/2020 1250 Last data filed at 01/04/2020 0545 Gross per 24 hour  Intake --  Output 850 ml  Net -850 ml   Filed Weights   12/30/19 2028 12/31/19 0015  Weight: (!) 163.3 kg (!) 168.3 kg    Physical Examination:  General: Obese female, no acute distress noted Head ENT: Atraumatic normocephalic, PERRLA, neck supple Heart: S1-S2 heard, regular rate and rhythm, no murmurs.  No leg edema noted Lungs: Equal air entry bilaterally, no rhonchi or rales on exam, no accessory muscle use Abdomen: Bowel sounds heard, soft, nontender, nondistended. No organomegaly.  No CVA tenderness Extremities: No pedal edema.  No cyanosis or clubbing. Neurological: Awake alert oriented x3, no focal weakness or numbness, strength and sensations to crude touch intact Skin: No wounds or rashes.   Data Reviewed: I have personally reviewed following labs and imaging studies  CBC: Recent Labs  Lab 12/30/19 2034 12/31/19 0227 01/01/20 0224 01/02/20 0430  WBC 7.8 9.1 7.5 6.8  NEUTROABS 5.2  --   --   --   HGB 13.5 12.2 11.7* 11.5*  HCT 42.9 39.2 37.7 36.3  MCV 87.7 88.1 88.1 86.0  PLT 232 227 221 546   Basic Metabolic Panel: Recent Labs  Lab 12/30/19 2034 12/31/19 0530 01/01/20 0224 01/02/20 0430 01/03/20 0448  NA 139 137 140 141 141  K 4.1 3.7 3.5 3.6 3.4*  CL 107 107 109 109 106  CO2 23 20* 22 25  26  GLUCOSE 127* 108* 100* 97 99  BUN 26* 25* 25* 16 12  CREATININE 1.69* 1.61* 1.32* 1.26* 1.28*  CALCIUM 9.2 8.7* 8.7* 8.8* 9.1   GFR: Estimated Creatinine Clearance: 83.2 mL/min (A) (by C-G formula based on SCr of 1.28 mg/dL (H)). Liver Function Tests: Recent Labs  Lab 12/30/19 2034  AST 35  ALT 40  ALKPHOS 123  BILITOT 0.5  PROT 8.2*  ALBUMIN 4.0   No results for input(s): LIPASE, AMYLASE in the last 168 hours. No results for input(s): AMMONIA in the last 168 hours. Coagulation Profile: Recent Labs  Lab 01/03/20 0823  01/04/20 0436  INR 1.1 1.4*   Cardiac Enzymes: No results for input(s): CKTOTAL, CKMB, CKMBINDEX, TROPONINI in the last 168 hours. BNP (last 3 results) No results for input(s): PROBNP in the last 8760 hours. HbA1C: No results for input(s): HGBA1C in the last 72 hours. CBG: No results for input(s): GLUCAP in the last 168 hours. Lipid Profile: No results for input(s): CHOL, HDL, LDLCALC, TRIG, CHOLHDL, LDLDIRECT in the last 72 hours. Thyroid Function Tests: No results for input(s): TSH, T4TOTAL, FREET4, T3FREE, THYROIDAB in the last 72 hours. Anemia Panel: No results for input(s): VITAMINB12, FOLATE, FERRITIN, TIBC, IRON, RETICCTPCT in the last 72 hours. Sepsis Labs: No results for input(s): PROCALCITON, LATICACIDVEN in the last 168 hours.  Recent Results (from the past 240 hour(s))  SARS CORONAVIRUS 2 (TAT 6-24 HRS) Nasopharyngeal Nasopharyngeal Swab     Status: None   Collection Time: 12/28/19  5:27 PM   Specimen: Nasopharyngeal Swab  Result Value Ref Range Status   SARS Coronavirus 2 NEGATIVE NEGATIVE Final    Comment: (NOTE) SARS-CoV-2 target nucleic acids are NOT DETECTED.  The SARS-CoV-2 RNA is generally detectable in upper and lower respiratory specimens during the acute phase of infection. Negative results do not preclude SARS-CoV-2 infection, do not rule out co-infections with other pathogens, and should not be used as the sole basis for treatment or other patient management decisions. Negative results must be combined with clinical observations, patient history, and epidemiological information. The expected result is Negative.  Fact Sheet for Patients: SugarRoll.be  Fact Sheet for Healthcare Providers: https://www.woods-mathews.com/  This test is not yet approved or cleared by the [place] FDA and  has been authorized for detection and/or diagnosis of SARS-CoV-2 by FDA under an Emergency Use Authorization (EUA). This  EUA will remain  in effect (meaning this test can be used) for the duration of the COVID-19 declaration under Se ction 564(b)(1) of the Act, 21 U.S.C. section 360bbb-3(b)(1), unless the authorization is terminated or revoked sooner.  Performed at [facility], [place] [address], [place], [place] [address]   SARS Coronavirus 2 by RT PCR (hospital order, performed in [facility] hospital lab) Nasopharyngeal Nasopharyngeal Swab     Status: None   Collection Time: 12/30/19 11:31 PM   Specimen: Nasopharyngeal Swab  Result Value Ref Range Status   SARS Coronavirus 2 NEGATIVE NEGATIVE Final    Comment: (NOTE) SARS-CoV-2 target nucleic acids are NOT DETECTED.  The SARS-CoV-2 RNA is generally detectable in upper and lower respiratory specimens during the acute phase of infection. The lowest concentration of SARS-CoV-2 viral copies this assay can detect is 250 copies / mL. A negative result does not preclude SARS-CoV-2 infection and should not be used as the sole basis for treatment or other patient management decisions.  A negative result may occur with improper specimen collection / handling, submission of specimen other than  nasopharyngeal swab, presence of viral mutation(s) within the areas targeted by this assay, and inadequate number of viral copies (<250 copies / mL). A negative result must be combined with clinical observations, patient history, and epidemiological information.  Fact Sheet for Patients:   StrictlyIdeas.no  Fact Sheet for Healthcare Providers: BankingDealers.co.za  This test is not yet approved or  cleared by the [place] FDA and has been authorized for detection and/or diagnosis of SARS-CoV-2 by FDA under an Emergency Use Authorization (EUA).  This EUA will remain in effect (meaning this test can be used) for the duration of the COVID-19 declaration under Section 564(b)(1) of the Act, 21 U.S.C. section  360bbb-3(b)(1), unless the authorization is terminated or revoked sooner.  Performed at [facility], [place] [address], [place], [place] [address]   MRSA PCR Screening     Status: None   Collection Time: 12/31/19  1:02 AM   Specimen: Nasopharyngeal  Result Value Ref Range Status   MRSA by PCR NEGATIVE NEGATIVE Final    Comment:        The GeneXpert MRSA Assay (FDA approved for NASAL specimens only), is one component of a comprehensive MRSA colonization surveillance program. It is not intended to diagnose MRSA infection nor to guide or monitor treatment for MRSA infections. Performed at [facility], [place] [address] [place], [place] [address]       Radiology Studies: No results found.    Scheduled Meds:  amLODipine  5 mg Oral Daily   carvedilol  3.125 mg Oral BID WC   enoxaparin (LOVENOX) injection  1 mg/kg Subcutaneous BID   gabapentin  300 mg Oral BID   hydrochlorothiazide  12.5 mg Oral Daily   levothyroxine  12.5 mcg Oral Q0600   loratadine  10 mg Oral Daily   montelukast  10 mg Oral QHS   potassium chloride  40 mEq Oral Once   rosuvastatin  2.5 mg Oral QHS   warfarin  10 mg Oral ONCE-1600   warfarin   Does not apply Once   Warfarin - Pharmacist Dosing Inpatient   Does not apply q1600   Continuous Infusions:    Assessment/Plan:  1.  Acute bilateral PE with large clot burden/RV strain: Nonprovoked.  She does have family history of blood clots in mother and may need prolonged anticoagulation.  Initially managed with IV heparin.  Currently on Lovenox and warfarin started on 8/1 per pharmacy recommendation (not a great candidate for DOAC's given extreme weight greater than 120 kg, BMI greater than 40).  Not hypoxic or hypotensive.  Echocardiogram showed preserved EF, moderate LVH, severely elevated pulmonary artery systolic pressure with BNP 1164.Marland Kitchen  H&H currently stable.  Walking desats did not reveal any evidence of  hypoxia or home O2 needs.  INR at 1.4 today after 2 doses of Coumadin as expected.  Patient unable to secure close PCP follow-up visit and also unable to self administer Lovenox.  Unfortunately will need to stay as inpatient until INR therapeutic.  2.  Hypertension:Resumed Coreg 3.125 twice daily, HCTZ 12.5 mg daily.  Heart rate briefly dropped to 40s during sleep on 7/31 with the 6.25 mg twice daily dosing of Coreg.  Will avoid nitrates given complaints of headache, tolerating low-dose Norvasc (although can cause headache as well) , will increase to 10 mg for better control.  Upon patient's insistence, hydralazine changed to clonidine for as needed use (concern for exacerbating bradycardia with beta plus alpha blockade).  Patient advised outpatient sleep study  as she certainly has the body habitus predisposing to OSA.  3.  AKI on CKD stage IIIb: Patient's baseline creatinine is around 1.1 and presented with creatinine 1.69-gradually improved to 1.3 and now at 1.2.  Likely related to diuretic/NSAID use.  Patient was on HCTZ 25 mg and meloxicam 15 g at home.  Pharmacy following for anticoagulant dosing.  4.  Hypothyroidism: Continue Synthroid.  5.  Asthma: Stable, resume home meds  6.  Mild hypokalemia: Replaced .  Repeat labs in AM.  7. Morbid obesity:Estimated body mass index is 70.11 kg/m as calculated from the following:Height as of this encounter: 5\' 1"  (1.549 m). Weight as of this encounter: 168.3 kg.   Patient has apparently enrolled in weight loss clinic.  DVT prophylaxis: On anticoagulation Code Status: Full code Family / Patient Communication:  Disposition Plan:   Status is: Inpatient  Remains inpatient appropriate because:Inpatient level of care appropriate due to severity of illness   Dispo: The patient is from: Home              Anticipated d/c is to: Home              Anticipated d/c date is: 2 days              Patient currently is not medically stable to  d/c.         Time spent: 25 MIN     >50% time spent in discussions with care team and coordination of care.    Guilford Shi, MD [facility] Pager in [place]  If 7PM-7AM, please contact night-coverage [email or web address] 01/04/2020, 12:50 PM

## 2020-01-05 ENCOUNTER — Other Ambulatory Visit: Payer: Self-pay

## 2020-01-05 ENCOUNTER — Telehealth (INDEPENDENT_AMBULATORY_CARE_PROVIDER_SITE_OTHER): Payer: Medicaid Other | Admitting: Psychology

## 2020-01-05 ENCOUNTER — Telehealth (INDEPENDENT_AMBULATORY_CARE_PROVIDER_SITE_OTHER): Payer: Self-pay | Admitting: Psychology

## 2020-01-05 DIAGNOSIS — N179 Acute kidney failure, unspecified: Secondary | ICD-10-CM | POA: Diagnosis not present

## 2020-01-05 DIAGNOSIS — I2699 Other pulmonary embolism without acute cor pulmonale: Secondary | ICD-10-CM | POA: Diagnosis not present

## 2020-01-05 DIAGNOSIS — I10 Essential (primary) hypertension: Secondary | ICD-10-CM | POA: Diagnosis not present

## 2020-01-05 DIAGNOSIS — J4520 Mild intermittent asthma, uncomplicated: Secondary | ICD-10-CM | POA: Diagnosis not present

## 2020-01-05 DIAGNOSIS — E039 Hypothyroidism, unspecified: Secondary | ICD-10-CM | POA: Diagnosis not present

## 2020-01-05 LAB — CBC
HCT: 39.4 % (ref 36.0–46.0)
Hemoglobin: 12.4 g/dL (ref 12.0–15.0)
MCH: 27.4 pg (ref 26.0–34.0)
MCHC: 31.5 g/dL (ref 30.0–36.0)
MCV: 87 fL (ref 80.0–100.0)
Platelets: 278 10*3/uL (ref 150–400)
RBC: 4.53 MIL/uL (ref 3.87–5.11)
RDW: 13.7 % (ref 11.5–15.5)
WBC: 6.2 10*3/uL (ref 4.0–10.5)
nRBC: 0 % (ref 0.0–0.2)

## 2020-01-05 LAB — BASIC METABOLIC PANEL
Anion gap: 10 (ref 5–15)
BUN: 11 mg/dL (ref 6–20)
CO2: 27 mmol/L (ref 22–32)
Calcium: 9.5 mg/dL (ref 8.9–10.3)
Chloride: 104 mmol/L (ref 98–111)
Creatinine, Ser: 1.28 mg/dL — ABNORMAL HIGH (ref 0.44–1.00)
GFR calc Af Amer: 58 mL/min — ABNORMAL LOW (ref >60)
GFR calc non Af Amer: 50 mL/min — ABNORMAL LOW (ref >60)
Glucose, Bld: 104 mg/dL — ABNORMAL HIGH (ref 70–99)
Potassium: 3.5 mmol/L (ref 3.5–5.1)
Sodium: 141 mmol/L (ref 135–145)

## 2020-01-05 LAB — PROTIME-INR
INR: 1.7 — ABNORMAL HIGH (ref 0.8–1.2)
Prothrombin Time: 19.3 seconds — ABNORMAL HIGH (ref 11.4–15.2)

## 2020-01-05 LAB — PROTHROMBIN GENE MUTATION

## 2020-01-05 MED ORDER — CLONIDINE HCL 0.2 MG PO TABS: 0.2000 mg | ORAL_TABLET | Freq: Every day | ORAL | Status: DC

## 2020-01-05 MED ORDER — LABETALOL HCL 5 MG/ML IV SOLN
5.0000 mg | Freq: Once | INTRAVENOUS | Status: AC
  Administered 2020-01-05: 5 mg via INTRAVENOUS
  Filled 2020-01-05: qty 4

## 2020-01-05 MED ORDER — CLONIDINE HCL 0.3 MG/24HR TD PTWK
0.3000 mg | MEDICATED_PATCH | TRANSDERMAL | Status: DC
  Administered 2020-01-05: 0.3 mg via TRANSDERMAL
  Filled 2020-01-05: qty 1

## 2020-01-05 MED ORDER — WARFARIN SODIUM 5 MG PO TABS
10.0000 mg | ORAL_TABLET | Freq: Once | ORAL | Status: AC
  Administered 2020-01-05: 10 mg via ORAL
  Filled 2020-01-05: qty 2

## 2020-01-05 NOTE — Progress Notes (Signed)
ANTICOAGULATION CONSULT NOTE - Follow Up Consult  Pharmacy Consult for warfarin + LMWH Indication: pulmonary embolus  Allergies  Allergen Reactions  . Doxycycline Nausea And Vomiting  . Flagyl [Metronidazole Hcl] Hives and Nausea And Vomiting    Patient Measurements: Height: 5\' 1"  (154.9 cm) Weight: (!) 168.3 kg (371 lb 0.6 oz) IBW/kg (Calculated) : 47.8   Vital Signs: Temp: 98.3 F (36.8 C) (08/04 0402) Temp Source: Oral (08/04 0402) BP: 160/100 (08/04 0544) Pulse Rate: 77 (08/04 0402)  Labs: Recent Labs    01/03/20 0448 01/03/20 0823 01/04/20 0436 01/05/20 0446  HGB  --   --   --  12.4  HCT  --   --   --  39.4  PLT  --   --   --  278  LABPROT  --  14.0 16.5* 19.3*  INR  --  1.1 1.4* 1.7*  CREATININE 1.28*  --   --  1.28*    Estimated Creatinine Clearance: 83.2 mL/min (A) (by C-G formula based on SCr of 1.28 mg/dL (H)).    Assessment: 44 yo female with new PE, was on heparin and tx to warfarin/enoxaparin  01/05/2020 INR 1.7, subtherapeutic but rising CBC WNL Eating 100% of meals No DI noted  Goal of Therapy:  INR 2-3   Plan:   Warfarin 10mg  po x1 today  Lovenox 1 mg/kg q12 hr; continue Lovenox until INR therapeutic for 2 consecutive readings AND at least 5 days  Daily INR  Monitor for signs and symptoms of bleeding  Dolly Rias RPh 01/05/2020, 10:21 AM

## 2020-01-05 NOTE — Progress Notes (Signed)
Pt encouraged to self-administer Lovenox while in the hospital to ensure proper technique. Pt continues to refuse self administration. Encouraged to attempt at next administration time.

## 2020-01-05 NOTE — Telephone Encounter (Signed)
°  Office: [phone]  /  Fax: [phone]  Date of Call: January 05, 2020  Time of Call: 2:02pm Duration of Call: 2.5 minutes Provider: Glennie Isle, PsyD  CONTENT:  Angela [name] was scheduled for a follow-up appointment via [employer] [facility] today at 2pm. Chart review revealed she is hospitalized. This provider called to check in and [name] confirmed she is still in the hospital. She noted she was feeling better and was to receptive to rescheduling today's appointment once discharged from the hospital to avoid any confidentiality/privacy concerns due to being in a hospital. A brief risk assessment was completed. [name] denied experiencing suicidal and homicidal ideation, plan, or intent since the last appointment with this provider.   PLAN: This provider will wait for [facility] to call back. No further follow-up planned by this provider.

## 2020-01-05 NOTE — Progress Notes (Signed)
PROGRESS NOTE    Angela [name]  GTX:[id number]  DOB: [birth year]  PCP: Ladell Pier, MD Admit date:12/30/2019 Chief compliant: shortness of breath Hospital course: 44 year old female with history of morbid obesity (BMI 68.02), asthma, hypertension, hypothyroidism, GERD, IBS, neuropathy, prediabetes presented with shortness of breath for 4 days prior to admission.  Denied any fevers. Apparently, she had left-sided chest discomfort 2 days PTA and went to urgent care at that time.Reported that she had some diarrhea on the day of admission after drinking tea which usually upsets her stomach.  Otherwise having no diarrhea. Her legs are always swollen, denied any calf pain or personal history of blood clots.  No recent travel, surgery or OCP use.  States her mother had a blood clot.   ED work up: CT angiogram showed acute bilateral pulmonary embolus with large clot burden and evidence of right heart strain, involving both main pulmonary arteries extending into all lobar and many segmental and subsegmental branches. Straightening of the intraventricular septum with RV to LV ratio of 2.1.Per ED physician discussion with critical care, not candidate for thrombolytic therapy as hemodynamically stable and able to maintain O2 sat on room air.  Venous Doppler negative for DVT.  Patient admitted to [facility] for further evaluation and management.    Subjective:  The patient was seen and examined this morning, stable no acute distress.  No issues overnight Still having difficulty given herself Lovenox subcu. Continue on Coumadin, INR today 1.7   ------------------------------------------------------------------------------------------------------------------------------------     Assessment/Plan:  1.  Acute bilateral PE with large clot burden/RV strain:  -Remained stable, not complaining of chest pain or shortness of breath today -Remains on Coumadin, INR 1.7 today -Pending therapeutic INR before  discharge  Nonprovoked.  She does have family history of blood clots in mother and may need prolonged anticoagulation.  Initially managed with IV heparin.  Currently on Lovenox and warfarin started on 8/1 per pharmacy recommendation (not a great candidate for DOAC's given extreme weight greater than 120 kg, BMI greater than 40).  Not hypoxic or hypotensive.  Echocardiogram showed preserved EF, moderate LVH, severely elevated pulmonary artery systolic pressure with BNP 1164.Marland Kitchen  H&H currently stable.  Walking desats did not reveal any evidence of hypoxia or home O2 needs.  INR at 1.4 today after 2 doses of Coumadin as expected.  Patient unable to secure close PCP follow-up visit and also unable to self administer Lovenox.  Unfortunately will need to stay as inpatient until INR therapeutic.  2.  Hypertension: Blood pressure remained elevated, patient is hydralazine today was switched to patch 0.3 mg -The rest of her medication will be continued as below  Resumed Coreg 3.125 twice daily, HCTZ 12.5 mg daily.   Heart rate briefly dropped to 40s during sleep on 7/31 with the 6.25 mg twice daily dosing of Coreg.  Will avoid nitrates given complaints of headache, Norvasc (although can cause headache as well) , .  Upon patient's insistence, hydralazine changed to clonidine for as needed use (concern for exacerbating bradycardia with beta plus alpha blockade).   -Patient advised outpatient sleep study as she certainly has the body habitus predisposing to OSA.  3.  AKI on CKD stage IIIb:  -Creatinine remained stable Patient's baseline creatinine is around 1.1 and presented with creatinine 1.69-gradually improved to 1.3 and now at 1.2.  >>1.28 Likely related to diuretic/NSAID use.  Patient was on HCTZ 25 mg and meloxicam 15 g at home.  Pharmacy following for anticoagulant dosing.  4.  Hypothyroidism: Continue Synthroid.  5.  Asthma: Stable, resume home meds  6.  Mild hypokalemia: Replaced .  Repeat labs in  AM.  7. Morbid obesity:Estimated body mass index is 70.11 kg/m as calculated from the following:Height as of this encounter: 5\' 1"  (1.549 m). Weight as of this encounter: 168.3 kg.   Patient has apparently enrolled in weight loss clinic.  DVT prophylaxis: On anticoagulation Code Status: Full code Family / Patient Communication:  Disposition Plan:   Status is: Inpatient  Remains inpatient appropriate because:Inpatient level of care appropriate due to severity of illness   Dispo: The patient is from: Home              Anticipated d/c is to: Home              Anticipated d/c date is: 2 days              Patient currently is not medically stable to d/c.       Objective: Vitals:   01/04/20 2252 01/05/20 0402 01/05/20 0544 01/05/20 1247  BP: (!) 160/97 (!) 158/109 (!) 160/100 137/81  Pulse: 64 77  73  Resp:  (!) 22  20  Temp:  98.3 F (36.8 C)  99.4 F (37.4 C)  TempSrc:  Oral  Oral  SpO2:  94%  96%  Weight:      Height:       No intake or output data in the 24 hours ending 01/05/20 1438 Filed Weights   12/30/19 2028 12/31/19 0015  Weight: (!) 163.3 kg (!) 168.3 kg      Physical Exam:   General:  Alert, oriented, cooperative, no distress;   HEENT:  Normocephalic, PERRL, otherwise with in Normal limits   Neuro:  CNII-XII intact. , normal motor and sensation, reflexes intact   Lungs:   Clear to auscultation BL, Respirations unlabored, no wheezes / crackles  Cardio:    S1/S2, RRR, No murmure, No Rubs or Gallops   Abdomen:   Soft, non-tender, bowel sounds active all four quadrants,  no guarding or peritoneal signs.  Muscular skeletal:  Limited exam - in bed, able to move all 4 extremities, Normal strength,  2+ pulses,  symmetric, No pitting edema  Skin:  Dry, warm to touch, negative for any Rashes, No open wounds  Wounds: Please see nursing documentation         Data Reviewed: I have personally reviewed following labs and imaging studies  CBC: Recent Labs  Lab  12/30/19 2034 12/31/19 0227 01/01/20 0224 01/02/20 0430 01/05/20 0446  WBC 7.8 9.1 7.5 6.8 6.2  NEUTROABS 5.2  --   --   --   --   HGB 13.5 12.2 11.7* 11.5* 12.4  HCT 42.9 39.2 37.7 36.3 39.4  MCV 87.7 88.1 88.1 86.0 87.0  PLT 232 227 221 231 970   Basic Metabolic Panel: Recent Labs  Lab 12/31/19 0530 12/31/19 0530 01/01/20 0224 01/02/20 0430 01/03/20 0448 01/04/20 1255 01/05/20 0446  NA 137  --  140 141 141  --  141  K 3.7   < > 3.5 3.6 3.4* 3.5 3.5  CL 107  --  109 109 106  --  104  CO2 20*  --  22 25 26   --  27  GLUCOSE 108*  --  100* 97 99  --  104*  BUN 25*  --  25* 16 12  --  11  CREATININE 1.61*  --  1.32* 1.26* 1.28*  --  1.28*  CALCIUM 8.7*  --  8.7* 8.8* 9.1  --  9.5   < > = values in this interval not displayed.   GFR: Estimated Creatinine Clearance: 83.2 mL/min (A) (by C-G formula based on SCr of 1.28 mg/dL (H)). Liver Function Tests: Recent Labs  Lab 12/30/19 2034  AST 35  ALT 40  ALKPHOS 123  BILITOT 0.5  PROT 8.2*  ALBUMIN 4.0   No results for input(s): LIPASE, AMYLASE in the last 168 hours. No results for input(s): AMMONIA in the last 168 hours. Coagulation Profile: Recent Labs  Lab 01/03/20 0823 01/04/20 0436 01/05/20 0446  INR 1.1 1.4* 1.7*     Recent Results (from the past 240 hour(s))  SARS CORONAVIRUS 2 (TAT 6-24 HRS) Nasopharyngeal Nasopharyngeal Swab     Status: None   Collection Time: 12/28/19  5:27 PM   Specimen: Nasopharyngeal Swab  Result Value Ref Range Status   SARS Coronavirus 2 NEGATIVE NEGATIVE Final    Comment: (NOTE) SARS-CoV-2 target nucleic acids are NOT DETECTED.  The SARS-CoV-2 RNA is generally detectable in upper and lower respiratory specimens during the acute phase of infection. Negative results do not preclude SARS-CoV-2 infection, do not rule out co-infections with other pathogens, and should not be used as the sole basis for treatment or other patient management decisions. Negative results must be  combined with clinical observations, patient history, and epidemiological information. The expected result is Negative.  Fact Sheet for Patients: SugarRoll.be  Fact Sheet for Healthcare Providers: https://www.woods-mathews.com/  This test is not yet approved or cleared by the [place] FDA and  has been authorized for detection and/or diagnosis of SARS-CoV-2 by FDA under an Emergency Use Authorization (EUA). This EUA will remain  in effect (meaning this test can be used) for the duration of the COVID-19 declaration under Se ction 564(b)(1) of the Act, 21 U.S.C. section 360bbb-3(b)(1), unless the authorization is terminated or revoked sooner.  Performed at [facility], [place] [address], [place], [place] [address]   SARS Coronavirus 2 by RT PCR (hospital order, performed in [facility] hospital lab) Nasopharyngeal Nasopharyngeal Swab     Status: None   Collection Time: 12/30/19 11:31 PM   Specimen: Nasopharyngeal Swab  Result Value Ref Range Status   SARS Coronavirus 2 NEGATIVE NEGATIVE Final    Comment: (NOTE) SARS-CoV-2 target nucleic acids are NOT DETECTED.  The SARS-CoV-2 RNA is generally detectable in upper and lower respiratory specimens during the acute phase of infection. The lowest concentration of SARS-CoV-2 viral copies this assay can detect is 250 copies / mL. A negative result does not preclude SARS-CoV-2 infection and should not be used as the sole basis for treatment or other patient management decisions.  A negative result may occur with improper specimen collection / handling, submission of specimen other than nasopharyngeal swab, presence of viral mutation(s) within the areas targeted by this assay, and inadequate number of viral copies (<250 copies / mL). A negative result must be combined with clinical observations, patient history, and epidemiological information.  Fact Sheet for Patients:     StrictlyIdeas.no  Fact Sheet for Healthcare Providers: BankingDealers.co.za  This test is not yet approved or  cleared by the [place] FDA and has been authorized for detection and/or diagnosis of SARS-CoV-2 by FDA under an Emergency Use Authorization (EUA).  This EUA will remain in effect (meaning this test can be used) for the duration of the COVID-19 declaration under Section 564(b)(1) of the Act, 21 U.S.C. section  360bbb-3(b)(1), unless the authorization is terminated or revoked sooner.  Performed at [facility], [place] [address], [place], [place] [address]   MRSA PCR Screening     Status: None   Collection Time: 12/31/19  1:02 AM   Specimen: Nasopharyngeal  Result Value Ref Range Status   MRSA by PCR NEGATIVE NEGATIVE Final    Comment:        The GeneXpert MRSA Assay (FDA approved for NASAL specimens only), is one component of a comprehensive MRSA colonization surveillance program. It is not intended to diagnose MRSA infection nor to guide or monitor treatment for MRSA infections. Performed at [facility], [place] [address] [place], [place] [address]       Radiology Studies: No results found.    Scheduled Meds: . amLODipine  10 mg Oral Daily  . carvedilol  3.125 mg Oral BID WC  . cloNIDine  0.3 mg Transdermal Weekly  . enoxaparin (LOVENOX) injection  1 mg/kg Subcutaneous BID  . gabapentin  300 mg Oral BID  . hydrochlorothiazide  12.5 mg Oral Daily  . levothyroxine  12.5 mcg Oral Q0600  . loratadine  10 mg Oral Daily  . montelukast  10 mg Oral QHS  . potassium chloride  40 mEq Oral Once  . rosuvastatin  2.5 mg Oral QHS  . warfarin  10 mg Oral ONCE-1600  . warfarin   Does not apply Once  . Warfarin - Pharmacist Dosing Inpatient   Does not apply q1600   Continuous Infusions:          Time spent: 25 MIN     >50% time spent in discussions with care team and  coordination of care.    Deatra James, MD [facility] Pager in [place]  If 7PM-7AM, please contact night-coverage [email or web address] 01/05/2020, 2:38 PM

## 2020-01-06 DIAGNOSIS — J4520 Mild intermittent asthma, uncomplicated: Secondary | ICD-10-CM | POA: Diagnosis not present

## 2020-01-06 DIAGNOSIS — I10 Essential (primary) hypertension: Secondary | ICD-10-CM | POA: Diagnosis not present

## 2020-01-06 DIAGNOSIS — N179 Acute kidney failure, unspecified: Secondary | ICD-10-CM | POA: Diagnosis not present

## 2020-01-06 DIAGNOSIS — E039 Hypothyroidism, unspecified: Secondary | ICD-10-CM | POA: Diagnosis not present

## 2020-01-06 DIAGNOSIS — I2699 Other pulmonary embolism without acute cor pulmonale: Secondary | ICD-10-CM | POA: Diagnosis not present

## 2020-01-06 LAB — PROTIME-INR
INR: 2.1 — ABNORMAL HIGH (ref 0.8–1.2)
Prothrombin Time: 22.6 seconds — ABNORMAL HIGH (ref 11.4–15.2)

## 2020-01-06 MED ORDER — GABAPENTIN 300 MG PO CAPS: 300.0000 mg | ORAL_CAPSULE | Freq: Two times a day (BID) | ORAL | 0 refills | Status: DC

## 2020-01-06 MED ORDER — POTASSIUM CHLORIDE 20 MEQ PO PACK: 40.0000 meq | PACK | Freq: Once | ORAL | 0 refills | Status: DC

## 2020-01-06 MED ORDER — ALBUTEROL SULFATE (2.5 MG/3ML) 0.083% IN NEBU: 2.5000 mg | INHALATION_SOLUTION | Freq: Four times a day (QID) | RESPIRATORY_TRACT | 12 refills | Status: DC | PRN

## 2020-01-06 MED ORDER — WARFARIN VIDEO: 5.0000 | Freq: Once | 3 refills | Status: AC

## 2020-01-06 MED ORDER — LABETALOL HCL 200 MG PO TABS: 200.0000 mg | ORAL_TABLET | Freq: Two times a day (BID) | ORAL | 2 refills | Status: DC

## 2020-01-06 MED ORDER — LABETALOL HCL 200 MG PO TABS
200.0000 mg | ORAL_TABLET | Freq: Two times a day (BID) | ORAL | Status: DC
  Administered 2020-01-06: 200 mg via ORAL
  Filled 2020-01-06: qty 1

## 2020-01-06 MED ORDER — HYDROCHLOROTHIAZIDE 12.5 MG PO TABS: 12.5000 mg | ORAL_TABLET | Freq: Every day | ORAL | 1 refills | Status: DC

## 2020-01-06 MED ORDER — AMLODIPINE BESYLATE 10 MG PO TABS: 10.0000 mg | ORAL_TABLET | Freq: Every day | ORAL | 2 refills | Status: DC

## 2020-01-06 MED ORDER — LEVOTHYROXINE SODIUM 25 MCG PO TABS: 12.5000 ug | ORAL_TABLET | Freq: Every day | ORAL | 2 refills | Status: DC

## 2020-01-06 NOTE — Telephone Encounter (Signed)
Please address this today

## 2020-01-06 NOTE — Plan of Care (Signed)
Pt called RN on floor to advise her coumadin prescription did not have a dose and her pharmacy would not fill it.  She did not have a dose in hospital today.  Per discharge summary, dose is 5mg  daily with follow up in coumadin clinic in 2-3 days.  Spoke with pharmacist at [facility] and advised 5mg  dosing.

## 2020-01-06 NOTE — Discharge Summary (Signed)
Physician Discharge Summary [facility]    Patient: Angela [name]                   Admit date: 12/30/2019   DOB: Jan 22, 1976             Discharge date:01/06/2020/10:23 AM BZJ:[id number]                          PCP: Ladell Pier, MD  Disposition: HOME   Recommendations for Outpatient Follow-up:   . Follow up: in 1 week  . Follow with the Coumadin clinic, your Coumadin dose continue to be adjusted to maintain INR 1-2  Discharge Condition: Stable   Code Status:   Code Status: Full Code  Diet recommendation: Regular healthy diet   Discharge Diagnoses:    Principal Problem:   Acute pulmonary embolism ([place]) Active Problems:   HTN (hypertension), benign   AKI (acute kidney injury) ([place])   Asthma   Hypothyroidism   History of Present Illness/ Hospital Course Angela [name] Summary:     Admit date:12/30/2019 Chief compliant: shortness of breath Hospital course: 44 year old female with history of morbid obesity (BMI 68.02), asthma, hypertension, hypothyroidism, GERD, IBS, neuropathy, prediabetes presented with shortness of breath for 4 days prior to admission. Denied any fevers. Apparently, she had left-sided chest discomfort 2 days PTA and went to urgent care at that time.Reported that she had some diarrhea on the day of admission after drinking tea which usually upsets her stomach. Otherwise having no diarrhea. Her legs are always swollen, denied any calf pain or personal history of blood clots. No recent travel, surgery or OCP use. States her mother had a blood clot.  ED work up: CT angiogram showed acute bilateral pulmonary embolus with large clot burden and evidence of right heart strain, involving both main pulmonary arteries extending into all lobar and many segmental and subsegmental branches. Straightening of the intraventricular septum with RV to LV ratio of 2.1.Per ED physician discussion with critical care, not candidate for thrombolytic therapy as  hemodynamically stable and able to maintain O2 sat on room air.  Venous Doppler negative for DVT.  Patient admitted to [facility] for further evaluation and management.    Detailed discharge summary  1.  Acute bilateral PE with large clot burden/RV strain:  -Remained stable, not complaining of chest pain or shortness of breath today -Remains on Coumadin, INR 1.7>>>2.1  today -Resuming Coumadin 5 mg daily, INR today to recheck in next 2-3 days, dosage continue to be adjusted-at this point recommending 3 -6 month of treatment with a close follow with PCP  Nonprovoked.  She does have family history of blood clots in mother and may need prolonged anticoagulation.  Initially managed with IV heparin.  Currently on Lovenox and warfarin started on 8/1 per pharmacy recommendation (not a great candidate for DOAC's given extreme weight greater than 120 kg, BMI greater than 40).  Not hypoxic or hypotensive.  Echocardiogram showed preserved EF, moderate LVH, severely elevated pulmonary artery systolic pressure with BNP 1164.Marland Kitchen  H&H currently stable.  Walking desats did not reveal any evidence of hypoxia or home O2 needs.   2.  Hypertension: Blood pressure remained elevated -The rest of her medication will be continued as below  Resumed Coreg 3.125 twice daily was DC'd switched to labetalol for better blood pressure labetalol 200 mg p.o. twice daily HCTZ 12.5 mg daily.   Will avoid nitrates given complaints of headache, Norvasc (although can  cause headache as well), .  Upon patient's insistence, hydralazine was DC'd  (concern for exacerbating bradycardia with beta plus alpha blockade).   -Patient advised outpatient sleep study as she certainly has the body habitus predisposing to OSA.  3.  AKI on CKD stage IIIb:  -Creatinine remained stable Patient's baseline creatinine is around 1.1 and presented with creatinine 1.69-gradually improved to 1.3 and now at 1.2.  >>1.28 >>  Likely related to diuretic/NSAID  use.  Patient was on HCTZ 25 mg and meloxicam 15 g at home.  Pharmacy following for anticoagulant dosing.  4.  Hypothyroidism: Continue Synthroid.  5.  Asthma: Stable, resume home meds  6.  Mild hypokalemia: Replaced .  Repeat labs in AM.  7. Morbid obesity:Estimated body mass index is 70.11 kg/m as calculated from the following:Height as of this encounter: 5\' 1"  (1.549 m).Weight as of this encounter: 168.3 kg.  Patient has apparently enrolled in weight loss clinic.   Code Status: Full code  Disposition Plan:   Status is: Inpatient   Dispo: The patient is from: Home  Anticipated d/c is to: Home With close follow-up with PCP, Coumadin clinic -Coumadin continue to adjust dosage for INR goal of 2-3     Nutritional status:          Discharge Instructions:   Discharge Instructions    Activity as tolerated - No restrictions   Complete by: As directed    Call MD for:  difficulty breathing, headache or visual disturbances   Complete by: As directed    Call MD for:  temperature >100.4   Complete by: As directed    Diet - low sodium heart healthy   Complete by: As directed    Discharge instructions   Complete by: As directed    Follow-up with your PCP within 1 to 2 weeks.  Follow-up with the Coumadin clinic, to maintain your INR between 1 -2. Recommending aggressive weight loss.   Increase activity slowly   Complete by: As directed        Medication List    STOP taking these medications   acetaminophen 500 MG tablet Commonly known as: TYLENOL   albuterol (5 MG/ML) 0.5% nebulizer solution Commonly known as: PROVENTIL Replaced by: albuterol (2.5 MG/3ML) 0.083% nebulizer solution You also have another medication with the same name that you need to continue taking as instructed.   benzonatate 100 MG capsule Commonly known as: TESSALON   carvedilol 25 MG tablet Commonly known as: COREG   cetirizine 10 MG tablet Commonly known as: ZyrTEC  Allergy   meloxicam 15 MG tablet Commonly known as: MOBIC   promethazine-dextromethorphan 6.25-15 MG/5ML syrup Commonly known as: PROMETHAZINE-DM     TAKE these medications   albuterol 108 (90 Base) MCG/ACT inhaler Commonly known as: VENTOLIN HFA Inhale 1-2 puffs into the lungs every 6 (six) hours as needed for wheezing or shortness of breath. What changed:   Another medication with the same name was added. Make sure you understand how and when to take each.  Another medication with the same name was removed. Continue taking this medication, and follow the directions you see here.   albuterol (2.5 MG/3ML) 0.083% nebulizer solution Commonly known as: PROVENTIL Take 3 mLs (2.5 mg total) by nebulization every 6 (six) hours as needed for wheezing or shortness of breath. What changed: You were already taking a medication with the same name, and this prescription was added. Make sure you understand how and when to take each. Replaces: albuterol (5  MG/ML) 0.5% nebulizer solution   amLODipine 10 MG tablet Commonly known as: NORVASC Take 1 tablet (10 mg total) by mouth daily.   gabapentin 300 MG capsule Commonly known as: NEURONTIN Take 1 capsule (300 mg total) by mouth 2 (two) times daily.   hydrochlorothiazide 12.5 MG tablet Commonly known as: HYDRODIURIL Take 1 tablet (12.5 mg total) by mouth daily. What changed:   medication strength  how much to take   labetalol 200 MG tablet Commonly known as: NORMODYNE Take 1 tablet (200 mg total) by mouth 2 (two) times daily.   levothyroxine 25 MCG tablet Commonly known as: Synthroid Take 0.5 tablets (12.5 mcg total) by mouth daily at 6 (six) AM. Start taking on: January 07, 2020 What changed:   when to take this  Another medication with the same name was removed. Continue taking this medication, and follow the directions you see here.   montelukast 10 MG tablet Commonly known as: SINGULAIR Take 1 tablet (10 mg total) by mouth  at bedtime.   potassium chloride 20 MEQ packet Commonly known as: KLOR-CON Take 40 mEq by mouth once for 1 dose.   rosuvastatin 5 MG tablet Commonly known as: Crestor Take 0.5 tablets (2.5 mg total) by mouth at bedtime.   Vitamin D (Ergocalciferol) 1.25 MG (50000 UNIT) Caps capsule Commonly known as: DRISDOL Take one tablet wkly What changed:   how much to take  how to take this  when to take this  additional instructions   warfarin Misc Commonly known as: COUMADIN 5 each by Does not apply route once for 1 dose.       Allergies  Allergen Reactions  . Doxycycline Nausea And Vomiting  . Flagyl [Metronidazole Hcl] Hives and Nausea And Vomiting     Procedures /Studies:   DG Chest 2 View  Result Date: 12/30/2019 CLINICAL DATA:  Shortness of breath. EXAM: CHEST - 2 VIEW COMPARISON:  December 28, 2019. FINDINGS: Stable cardiomediastinal silhouette. No pneumothorax or pleural effusion is noted. Both lungs are clear. The visualized skeletal structures are unremarkable. IMPRESSION: No active cardiopulmonary disease. Electronically Signed   By: Marijo Conception M.D.   On: 12/30/2019 20:50   DG Chest 2 View  Result Date: 12/28/2019 CLINICAL DATA:  44 year old female with history of cough and left lower chest pain. EXAM: CHEST - 2 VIEW COMPARISON:  Chest x-ray 07/26/2018. FINDINGS: Lung volumes are normal. No consolidative airspace disease. No pleural effusions. No pneumothorax. No pulmonary nodule or mass noted. Pulmonary vasculature is normal. Heart size is mildly enlarged. Upper mediastinal contours are within normal limits. IMPRESSION: 1. No radiographic evidence of acute cardiopulmonary disease. 2. Mild cardiomegaly. Electronically Signed   By: Vinnie Langton M.D.   On: 12/28/2019 19:10   CT Angio Chest PE W and/or Wo Contrast  Result Date: 12/30/2019 CLINICAL DATA:  Shortness of breath for 4 days. EXAM: CT ANGIOGRAPHY CHEST WITH CONTRAST TECHNIQUE: Multidetector CT imaging of  the chest was performed using the standard protocol during bolus administration of intravenous contrast. Multiplanar CT image reconstructions and MIPs were obtained to evaluate the vascular anatomy. CONTRAST:  66mL OMNIPAQUE IOHEXOL 350 MG/ML SOLN COMPARISON:  Radiograph earlier this day. FINDINGS: Cardiovascular: Examination is positive for acute pulmonary embolus with filling defects within the bilateral main pulmonary arteries extending into all lobar, many segmental and subsegmental branches. Thromboembolic burden is large. There is straightening of the intraventricular septum with RV to LV ratio of 2.1. Minimal contrast refluxes into the hepatic veins and IVC. The  thoracic aorta is normal in caliber no evidence of aortic dissection. Left vertebral artery arises directly from the thoracic aorta. No pericardial effusion. Mediastinum/Nodes: No enlarged mediastinal or hilar lymph nodes. Decompressed esophagus. No obvious thyroid nodule. Lungs/Pleura: No focal airspace disease. No evidence of pulmonary infarct. No pleural fluid. No findings of pulmonary edema. Upper Abdomen: Prominent liver partially included. Musculoskeletal: There are no acute or suspicious osseous abnormalities. Review of the MIP images confirms the above findings. IMPRESSION: 1. Examination is positive for acute bilateral pulmonary embolus with large clot burden and evidence of right heart strain. Emboli involve both main pulmonary arteries extending into all lobar and many segmental and subsegmental branches. Straightening of the intraventricular septum with RV to LV ratio of 2.1. Minimal contrast refluxes into the hepatic veins and IVC. 2. No pulmonary infarct. Critical Value/emergent results were called by telephone at the time of interpretation on 12/30/2019 at 11:00 pm to Dr Veryl Speak , who verbally acknowledged these results. Electronically Signed   By: Keith Rake M.D.   On: 12/30/2019 23:01   ECHOCARDIOGRAM COMPLETE  Result  Date: 12/31/2019    ECHOCARDIOGRAM REPORT   Patient Name:   SHARRI LOYA Killam Date of Exam: 12/31/2019 Medical Rec #:  [id number]        Height:       61.0 in Accession #:    [id number]       Weight:       371.0 lb Date of Birth:  [birth year]        BSA:          2.456 m Patient Age:    65 years         BP:           146/113 mmHg Patient Gender: F                HR:           90 bpm. Exam Location:  Inpatient Procedure: 2D Echo Indications:    Pulmonary Embolism  History:        Patient has no prior history of Echocardiogram examinations.                 Signs/Symptoms:Murmur; Risk Factors:Hypertension.  Sonographer:    Mikki Santee RDCS (AE) Referring Phys: [id number] [place]  1. Left ventricular ejection fraction, by estimation, is 60 to 65%. The left ventricle has normal function. The left ventricle has no regional wall motion abnormalities. There is moderate left ventricular hypertrophy and severe basal hypertrophy.  2. Right ventricular systolic function is normal. The right ventricular size is normal. There is severely elevated pulmonary artery systolic pressure. The estimated right ventricular systolic pressure is 72.5 mmHg.  3. The mitral valve is normal in structure. No evidence of mitral valve regurgitation. No evidence of mitral stenosis.  4. Tricuspid valve regurgitation is mild to moderate.  5. The aortic valve is normal in structure. Aortic valve regurgitation is not visualized. No aortic stenosis is present.  6. The inferior vena cava is dilated in size with >50% respiratory variability, suggesting right atrial pressure of 8 mmHg. FINDINGS  Left Ventricle: Left ventricular ejection fraction, by estimation, is 60 to 65%. The left ventricle has normal function. The left ventricle has no regional wall motion abnormalities. The left ventricular internal cavity size was normal in size. There is  moderate left ventricular hypertrophy. Left ventricular diastolic parameters are consistent  with Grade I diastolic dysfunction (impaired relaxation). Normal left ventricular filling pressure.  Right Ventricle: The right ventricular size is normal. No increase in right ventricular wall thickness. Right ventricular systolic function is normal. There is severely elevated pulmonary artery systolic pressure. The tricuspid regurgitant velocity is 3.73 m/s, and with an assumed right atrial pressure of 8 mmHg, the estimated right ventricular systolic pressure is 44.3 mmHg. Left Atrium: Left atrial size was normal in size. Right Atrium: Right atrial size was normal in size. Pericardium: There is no evidence of pericardial effusion. Mitral Valve: The mitral valve is normal in structure. Normal mobility of the mitral valve leaflets. Moderate mitral annular calcification. No evidence of mitral valve regurgitation. No evidence of mitral valve stenosis. Tricuspid Valve: The tricuspid valve is normal in structure. Tricuspid valve regurgitation is mild to moderate. No evidence of tricuspid stenosis. Aortic Valve: The aortic valve is normal in structure. Aortic valve regurgitation is not visualized. No aortic stenosis is present. Pulmonic Valve: The pulmonic valve was normal in structure. Pulmonic valve regurgitation is trivial. No evidence of pulmonic stenosis. Aorta: The aortic root is normal in size and structure. Venous: The inferior vena cava is dilated in size with greater than 50% respiratory variability, suggesting right atrial pressure of 8 mmHg. IAS/Shunts: No atrial level shunt detected by color flow Doppler.  LEFT VENTRICLE PLAX 2D LVIDd:         3.50 cm  Diastology LVIDs:         2.20 cm  LV e' lateral:   6.42 cm/s LV PW:         1.40 cm  LV E/e' lateral: 10.1 LV IVS:        1.70 cm  LV e' medial:    7.40 cm/s LVOT diam:     2.10 cm  LV E/e' medial:  8.8 LV SV:         40 LV SV Index:   16 LVOT Area:     3.46 cm  RIGHT VENTRICLE TAPSE (M-mode): 2.1 cm LEFT ATRIUM             Index       RIGHT ATRIUM            Index LA diam:        2.90 cm 1.18 cm/m  RA Area:     20.30 cm LA Vol (A2C):   39.4 ml 16.04 ml/m RA Volume:   56.70 ml  23.09 ml/m LA Vol (A4C):   24.3 ml 9.89 ml/m LA Biplane Vol: 31.5 ml 12.83 ml/m  AORTIC VALVE LVOT Vmax:   67.90 cm/s LVOT Vmean:  48.900 cm/s LVOT VTI:    0.116 m  AORTA Ao Root diam: 2.90 cm MITRAL VALVE               TRICUSPID VALVE MV Area (PHT): 2.93 cm    TR Peak grad:   55.7 mmHg MV Decel Time: 259 msec    TR Vmax:        373.00 cm/s MV E velocity: 65.10 cm/s MV A velocity: 75.00 cm/s  SHUNTS MV E/A ratio:  0.87        Systemic VTI:  0.12 m                            Systemic Diam: 2.10 cm Fransico Him MD Electronically signed by Fransico Him MD Signature Date/Time: 12/31/2019/11:17:22 AM    Final    VAS [place] LOWER EXTREMITY VENOUS (DVT)  Result Date: 01/01/2020  Lower Venous DVTStudy  Indications: Pulmonary embolism.  Limitations: Body habitus and poor ultrasound/tissue interface. Comparison Study: no prior Performing Technologist: Abram Sander RVS  Examination Guidelines: A complete evaluation includes B-mode imaging, spectral Doppler, color Doppler, and power Doppler as needed of all accessible portions of each vessel. Bilateral testing is considered an integral part of a complete examination. Limited examinations for reoccurring indications may be performed as noted. The reflux portion of the exam is performed with the patient in reverse Trendelenburg.  +---------+---------------+---------+-----------+----------+--------------+ RIGHT    CompressibilityPhasicitySpontaneityPropertiesThrombus Aging +---------+---------------+---------+-----------+----------+--------------+ CFV      Full           Yes      Yes                                 +---------+---------------+---------+-----------+----------+--------------+ SFJ      Full                                                        +---------+---------------+---------+-----------+----------+--------------+ FV  Prox  Full                                                        +---------+---------------+---------+-----------+----------+--------------+ FV Mid   Full                                                        +---------+---------------+---------+-----------+----------+--------------+ FV DistalFull                                                        +---------+---------------+---------+-----------+----------+--------------+ PFV      Full                                                        +---------+---------------+---------+-----------+----------+--------------+ POP                                                   Not visualized +---------+---------------+---------+-----------+----------+--------------+ PTV                                                   Not visualized +---------+---------------+---------+-----------+----------+--------------+ PERO  Not visualized +---------+---------------+---------+-----------+----------+--------------+   +---------+---------------+---------+-----------+----------+--------------+ LEFT     CompressibilityPhasicitySpontaneityPropertiesThrombus Aging +---------+---------------+---------+-----------+----------+--------------+ CFV      Full           Yes      Yes                                 +---------+---------------+---------+-----------+----------+--------------+ SFJ      Full                                                        +---------+---------------+---------+-----------+----------+--------------+ FV Prox  Full                                                        +---------+---------------+---------+-----------+----------+--------------+ FV Mid   Full                                                        +---------+---------------+---------+-----------+----------+--------------+ FV DistalFull                                                         +---------+---------------+---------+-----------+----------+--------------+ PFV      Full                                                        +---------+---------------+---------+-----------+----------+--------------+ POP                                                   Not visualized +---------+---------------+---------+-----------+----------+--------------+ PTV                                                   Not visualized +---------+---------------+---------+-----------+----------+--------------+ PERO                                                  Not visualized +---------+---------------+---------+-----------+----------+--------------+     Summary: RIGHT: - There is no evidence of deep vein thrombosis in the lower extremity. However, portions of this examination were limited- see technologist comments above.  LEFT: - There is no evidence of deep vein thrombosis in the lower extremity. However, portions of this examination were limited- see technologist comments above.  *See table(s) above for measurements  and observations. Electronically signed by Deitra Mayo MD on 01/01/2020 at 4:19:34 AM.    Final      Subjective:   Patient was seen and examined 01/06/2020, 10:23 AM Patient stable today. No acute distress.  No issues overnight Stable for discharge.  Discharge Exam:    Vitals:   01/05/20 1247 01/05/20 2031 01/05/20 2137 01/06/20 0446  BP: 137/81 (!) 166/102 (!) 153/87 (!) 161/99  Pulse: 73 73 70 66  Resp: 20 19 19 19   Temp: 99.4 F (37.4 C) 98.3 F (36.8 C) 98.7 F (37.1 C) 98.7 F (37.1 C)  TempSrc: Oral Oral Oral Oral  SpO2: 96% 100% 97% 99%  Weight:      Height:        General: Pt lying comfortably in bed & appears in no obvious distress. Cardiovascular: S1 & S2 heard, RRR, S1/S2 +. No murmurs, rubs, gallops or clicks. No JVD or pedal edema. Respiratory: Clear to auscultation without wheezing, rhonchi or crackles.  No increased work of breathing. Abdominal:  Non-distended, non-tender & soft. No organomegaly or masses appreciated. Normal bowel sounds heard. CNS: Alert and oriented. No focal deficits. Extremities: no edema, no cyanosis    The results of significant diagnostics from this hospitalization (including imaging, microbiology, ancillary and laboratory) are listed below for reference.      Microbiology:   Recent Results (from the past 240 hour(s))  SARS CORONAVIRUS 2 (TAT 6-24 HRS) Nasopharyngeal Nasopharyngeal Swab     Status: None   Collection Time: 12/28/19  5:27 PM   Specimen: Nasopharyngeal Swab  Result Value Ref Range Status   SARS Coronavirus 2 NEGATIVE NEGATIVE Final    Comment: (NOTE) SARS-CoV-2 target nucleic acids are NOT DETECTED.  The SARS-CoV-2 RNA is generally detectable in upper and lower respiratory specimens during the acute phase of infection. Negative results do not preclude SARS-CoV-2 infection, do not rule out co-infections with other pathogens, and should not be used as the sole basis for treatment or other patient management decisions. Negative results must be combined with clinical observations, patient history, and epidemiological information. The expected result is Negative.  Fact Sheet for Patients: SugarRoll.be  Fact Sheet for Healthcare Providers: https://www.woods-mathews.com/  This test is not yet approved or cleared by the [place] FDA and  has been authorized for detection and/or diagnosis of SARS-CoV-2 by FDA under an Emergency Use Authorization (EUA). This EUA will remain  in effect (meaning this test can be used) for the duration of the COVID-19 declaration under Se ction 564(b)(1) of the Act, 21 U.S.C. section 360bbb-3(b)(1), unless the authorization is terminated or revoked sooner.  Performed at [facility], [place] [address], [place], [place] [address]   SARS Coronavirus 2 by RT PCR  (hospital order, performed in [facility] hospital lab) Nasopharyngeal Nasopharyngeal Swab     Status: None   Collection Time: 12/30/19 11:31 PM   Specimen: Nasopharyngeal Swab  Result Value Ref Range Status   SARS Coronavirus 2 NEGATIVE NEGATIVE Final    Comment: (NOTE) SARS-CoV-2 target nucleic acids are NOT DETECTED.  The SARS-CoV-2 RNA is generally detectable in upper and lower respiratory specimens during the acute phase of infection. The lowest concentration of SARS-CoV-2 viral copies this assay can detect is 250 copies / mL. A negative result does not preclude SARS-CoV-2 infection and should not be used as the sole basis for treatment or other patient management decisions.  A negative result may occur with improper specimen collection / handling, submission of specimen other  than nasopharyngeal swab, presence of viral mutation(s) within the areas targeted by this assay, and inadequate number of viral copies (<250 copies / mL). A negative result must be combined with clinical observations, patient history, and epidemiological information.  Fact Sheet for Patients:   StrictlyIdeas.no  Fact Sheet for Healthcare Providers: BankingDealers.co.za  This test is not yet approved or  cleared by the [place] FDA and has been authorized for detection and/or diagnosis of SARS-CoV-2 by FDA under an Emergency Use Authorization (EUA).  This EUA will remain in effect (meaning this test can be used) for the duration of the COVID-19 declaration under Section 564(b)(1) of the Act, 21 U.S.C. section 360bbb-3(b)(1), unless the authorization is terminated or revoked sooner.  Performed at [facility], [place] [address] [place], [place] [address]   MRSA PCR Screening     Status: None   Collection Time: 12/31/19  1:02 AM   Specimen: Nasopharyngeal  Result Value Ref Range Status   MRSA by PCR NEGATIVE NEGATIVE Final     Comment:        The GeneXpert MRSA Assay (FDA approved for NASAL specimens only), is one component of a comprehensive MRSA colonization surveillance program. It is not intended to diagnose MRSA infection nor to guide or monitor treatment for MRSA infections. Performed at [facility], [place] [address], [place], [place] [address]      Labs:   CBC: Recent Labs  Lab 12/30/19 2034 12/31/19 0227 01/01/20 0224 01/02/20 0430 01/05/20 0446  WBC 7.8 9.1 7.5 6.8 6.2  NEUTROABS 5.2  --   --   --   --   HGB 13.5 12.2 11.7* 11.5* 12.4  HCT 42.9 39.2 37.7 36.3 39.4  MCV 87.7 88.1 88.1 86.0 87.0  PLT 232 227 221 231 094   Basic Metabolic Panel: Recent Labs  Lab 12/31/19 0530 12/31/19 0530 01/01/20 0224 01/02/20 0430 01/03/20 0448 01/04/20 1255 01/05/20 0446  NA 137  --  140 141 141  --  141  K 3.7   < > 3.5 3.6 3.4* 3.5 3.5  CL 107  --  109 109 106  --  104  CO2 20*  --  22 25 26   --  27  GLUCOSE 108*  --  100* 97 99  --  104*  BUN 25*  --  25* 16 12  --  11  CREATININE 1.61*  --  1.32* 1.26* 1.28*  --  1.28*  CALCIUM 8.7*  --  8.7* 8.8* 9.1  --  9.5   < > = values in this interval not displayed.   Liver Function Tests: Recent Labs  Lab 12/30/19 2034  AST 35  ALT 40  ALKPHOS 123  BILITOT 0.5  PROT 8.2*  ALBUMIN 4.0   BNP (last 3 results) Recent Labs    12/30/19 2157 12/31/19 0227 12/31/19 0437  BNP 1,288.6* 1,109.8* 1,164.3*   Cardiac Enzymes: No results for input(s): CKTOTAL, CKMB, CKMBINDEX, TROPONINI in the last 168 hours. CBG: No results for input(s): GLUCAP in the last 168 hours. Hgb A1c No results for input(s): HGBA1C in the last 72 hours. Lipid Profile No results for input(s): CHOL, HDL, LDLCALC, TRIG, CHOLHDL, LDLDIRECT in the last 72 hours. Thyroid function studies No results for input(s): TSH, T4TOTAL, T3FREE, THYROIDAB in the last 72 hours.  Invalid input(s): FREET3 Anemia work up No results for input(s): VITAMINB12,  FOLATE, FERRITIN, TIBC, IRON, RETICCTPCT in the last 72 hours. Urinalysis    Component Value  Date/Time   COLORURINE YELLOW 07/27/2018 0100   APPEARANCEUR CLOUDY (A) 07/27/2018 0100   LABSPEC 1.021 07/27/2018 0100   PHURINE 5.0 07/27/2018 0100   GLUCOSEU NEGATIVE 07/27/2018 0100   HGBUR NEGATIVE 07/27/2018 0100   BILIRUBINUR NEGATIVE 07/27/2018 0100   KETONESUR NEGATIVE 07/27/2018 0100   PROTEINUR NEGATIVE 07/27/2018 0100   UROBILINOGEN 0.2 08/06/2016 1134   NITRITE NEGATIVE 07/27/2018 0100   LEUKOCYTESUR TRACE (A) 07/27/2018 0100         Time coordinating discharge: Over 45 minutes  SIGNED: Deatra James, MD, FACP, [facility]. [facility],  Please use [email or web address] to Page If 7PM-7AM, please contact night-coverage [email or web address] 01/06/2020, 10:23 AM

## 2020-01-06 NOTE — TOC Transition Note (Signed)
Transition of Care [facility]) - CM/SW Discharge Note   Patient Details  Name: Angela [name] MRN: [id number] Date of Birth: 02/09/1976  Transition of Care [facility]) CM/SW Contact:  Ross Ludwig, LCSW Phone Number: 01/06/2020, 2:03 PM   Clinical Narrative:     CSW was informed that patient will have to have her INR checked in the next few days.  Patient has an appointment with her PCP on Monday August 9.  CSW contacted [facility], and informed them to document that per physician, patient will need INR tested at her appointment on Monday.  CSW signing off, please reconsult if social work needs arise.   Final next level of care: Home/Self Care Barriers to Discharge: Barriers Resolved   Patient Goals and CMS Choice Patient states their goals for this hospitalization and ongoing recovery are:: To return back home.      Discharge Placement                       Discharge Plan and Services                  DME Agency: NA       HH Arranged: NA          Social Determinants of Health (SDOH) Interventions     Readmission Risk Interventions No flowsheet data found.

## 2020-01-06 NOTE — Progress Notes (Signed)
ANTICOAGULATION CONSULT NOTE - Follow Up Consult  Pharmacy Consult for warfarin + LMWH Indication: pulmonary embolus  Allergies  Allergen Reactions  . Doxycycline Nausea And Vomiting  . Flagyl [Metronidazole Hcl] Hives and Nausea And Vomiting    Patient Measurements: Height: 5\' 1"  (154.9 cm) Weight: (!) 168.3 kg (371 lb 0.6 oz) IBW/kg (Calculated) : 47.8   Vital Signs: Temp: 98.7 F (37.1 C) (08/05 0446) Temp Source: Oral (08/05 0446) BP: 161/99 (08/05 0446) Pulse Rate: 66 (08/05 0446)  Labs: Recent Labs    01/04/20 0436 01/05/20 0446 01/06/20 0424  HGB  --  12.4  --   HCT  --  39.4  --   PLT  --  278  --   LABPROT 16.5* 19.3* 22.6*  INR 1.4* 1.7* 2.1*  CREATININE  --  1.28*  --     Estimated Creatinine Clearance: 83.2 mL/min (A) (by C-G formula based on SCr of 1.28 mg/dL (H)).    Assessment: 44 yo female with new PE, was on heparin and tx to warfarin/enoxaparin  01/06/2020 INR 2.1, therapeutic CBC WNL (8/4) Eating 100% of meals No DI noted  Goal of Therapy:  INR 2-3   Plan:   Warfarin 10mg  po x1 today  Lovenox 1 mg/kg q12 hr; continue Lovenox until INR therapeutic for 2 consecutive readings AND at least 5 days  Daily INR  Monitor for signs and symptoms of bleeding  Dolly Rias RPh 01/06/2020, 10:25 AM

## 2020-01-07 ENCOUNTER — Telehealth: Payer: Self-pay

## 2020-01-07 NOTE — Telephone Encounter (Signed)
Transition Care Management Follow-up Telephone Call Date of discharge and from where: 01/06/2020 [facility] How have you been since you were released from the hospital? Feeling better  Any questions or concerns? None  Items Reviewed: Did the pt receive and understand the discharge instructions provided? YES Medications obtained and verified? YES  Any new allergies since your discharge? NONE Dietary orders reviewed? Yes  Do you have support at home? Daughter  Functional Questionnaire: (I = Independent and D = Dependent) ADLs: I   Follow up appointments reviewed:  PCP Hospital f/u appt confirmed?  Scheduled to see Dr Joya Gaskins on 01/10/2020 [facility] Hospital f/u appt confirmed? None   Are transportation arrangements needed? NO  If their condition worsens, /is the pt aware to call PCP or go to the Emergency Dept.?  Pt is aware if condition is worsening or start experiencing any of diff breathing, SOB, chest pain, extreme fatigue, Persistent nausea and vomiting, bleeding , rapid weight gain, severe uncontrolled pain, or visual disturbances to return to ED  Was the patient provided with contact information for the PCP's office or ED? YES given.  Was to pt encouraged to call back with questions or concerns?YES name and contact information .  DME none

## 2020-01-09 NOTE — Progress Notes (Deleted)
Subjective:    Patient ID: Angela [name], female    DOB: 12/04/1975, 44 y.o.   MRN: [id number]  44 y.o.F  Post hosp TCM visit  RN note 8/6: Transition Care Management Follow-up Telephone Call Date of discharge and from where: 01/06/2020 [facility] How have you been since you were released from the hospital? Feeling better  Any questions or concerns? None  Items Reviewed: Did the pt receive and understand the discharge instructions provided? YES Medications obtained and verified? YES  Any new allergies since your discharge? NONE Dietary orders reviewed? Yes  Do you have support at home? Daughter  Functional Questionnaire: (I = Independent and D = Dependent) ADLs: I   Follow up appointments reviewed:  PCP Hospital f/u appt confirmed?  Scheduled to see Dr Joya Gaskins on 01/10/2020 Specialist Hospital f/u appt confirmed? None   Are transportation arrangements needed? NO  If their condition worsens, /is the pt aware to call PCP or go to the Emergency Dept.?  Pt is aware if condition is worsening or start experiencing any of diff breathing, SOB, chest pain, extreme fatigue, Persistent nausea and vomiting, bleeding , rapid weight gain, severe uncontrolled pain, or visual disturbances to return to ED  Was the patient provided with contact information for the PCP's office or ED? YES given.  Was to pt encouraged to call back with questions or concerns?YES name and contact information .  DME none    Dc summary: Patient: NAIMA VELDHUIZEN                                                       Admit date: 12/30/2019   DOB: [birth year]                                                            Discharge date:01/06/2020/10:23 AM RDE:[id number]                                                                                                                                                                                    PCP: Ladell Pier, MD  Disposition: HOME    Recommendations for Outpatient Follow-up:    Follow up: in 1 week   Follow with the Coumadin clinic, your Coumadin dose continue to be adjusted to maintain INR 1-2  Discharge  Condition: Stable   Code Status:   Code Status: Full Code  Diet recommendation: Regular healthy diet   Discharge Diagnoses:   Principal Problem:   Acute pulmonary embolism ([place]) Active Problems:   HTN (hypertension), benign   AKI (acute kidney injury) ([place])   Asthma   Hypothyroidism   History of Present Illness/ Hospital Course [name] Summary:    Admit date:12/30/2019 Chief compliant:shortness of breath Hospital course:44 year old female with history of morbid obesity (BMI 68.02), asthma, hypertension, hypothyroidism, GERD, IBS, neuropathy, prediabetes presented with shortness of breath for 4 days prior to admission. Denied any fevers. Apparently, she had left-sided chest discomfort 2 days PTA and went to urgent care at that time.Reported that she had some diarrhea on the day of admission after drinking tea which usually upsets her stomach. Otherwise having no diarrhea. Her legs are always swollen, denied any calf pain or personal history of blood clots. No recent travel, surgery or OCP use. States her mother had a blood clot.  ED work up: CT angiogram showed acute bilateral pulmonary embolus with large clot burden and evidence of right heart strain, involving both main pulmonary arteries extending into all lobar and many segmental and subsegmental branches. Straightening of the intraventricular septum with RV to LV ratio of 2.1.Per ED physician discussion with critical care, not candidate for thrombolytic therapy as hemodynamically stable and able to maintain O2 sat on room air. Venous Doppler negative for DVT. Patient admitted to [facility] for further evaluation and management.   Detailed discharge summary  1. Acute bilateral PE with large clot burden/RV strain: -Remained stable,  not complaining of chest pain or shortness of breath today -Remains on Coumadin, INR 1.7>>>2.1  today -Resuming Coumadin 5 mg daily, INR today to recheck in next 2-3 days, dosage continue to be adjusted-at this point recommending 3 -6 month of treatment with a close follow with PCP  Nonprovoked. She does have family history of blood clots in mother and may need prolonged anticoagulation. Initially managed with IV heparin. Currently on Lovenox and warfarin started on 8/1 per pharmacy recommendation (not a great candidate for DOAC's given extreme weight greater than 120 kg, BMI greater than 40). Not hypoxic or hypotensive. Echocardiogram showed preserved EF, moderate LVH, severely elevated pulmonary artery systolic pressure with BNP 1164.Marland Kitchen H&H currently stable. Walking desats did not reveal any evidence of hypoxia or home O2 needs.   2. Hypertension: Blood pressure remained elevated -The rest of her medication will be continued as below  Resumed Coreg 3.125 twice daily was DC'd switched to labetalol for better blood pressure labetalol 200 mg p.o. twice daily HCTZ 12.5 mg daily.   Will avoid nitrates given complaints of headache, Norvasc (although can cause headache as well), . Upon patient's insistence, hydralazine was DC'd  (concern for exacerbating bradycardia with beta plus alpha blockade).  -Patient advised outpatient sleep study as she certainly has the body habitus predisposing to OSA.  3. AKI on CKD stage IIIb: -Creatinine remained stable Patient's baseline creatinine is around 1.1 and presented with creatinine 1.69-gradually improved to 1.3 and now at 1.2.>>1.28 >>  Likely related to diuretic/NSAID use. Patient was on HCTZ 25 mg and meloxicam 15 g at home. Pharmacy following for anticoagulant dosing.  4. Hypothyroidism: Continue Synthroid.  5. Asthma:Stable, resume home meds  6. Mild hypokalemia: Replaced . Repeat labs in AM.  7. Morbid  obesity:Estimated body mass index is 70.11 kg/m as calculated from the following:Height as of this encounter: 5\' 1"  (1.549 m).Weight as of this encounter:  168.3 kg.Patient has apparently enrolled in weight loss clinic.     Past Medical History:  Diagnosis Date  . Allergies   . Asthma   . Back pain   . Depression    hx, doing ok now  . Edema, lower extremity   . GERD (gastroesophageal reflux disease)   . Headache(784.0)   . Heart murmur   . Hypertension    on meds  . IBS (irritable bowel syndrome)   . Joint pain   . Neuropathy   . Obesity   . Osteoarthritis   . Ovarian cyst   . Prediabetes   . Preterm labor   . Vitamin D deficiency      Family History  Problem Relation Age of Onset  . Hyperlipidemia Mother   . Hypertension Mother   . Thyroid disease Mother   . Obesity Mother   . Clotting disorder Mother   . Cancer Son   . Hypertension Other   . Diabetes Other   . Other Neg Hx      Social History   Socioeconomic History  . Marital status: Single    Spouse name: Not on file  . Number of children: Not on file  . Years of education: Not on file  . Highest education level: Not on file  Occupational History  . Occupation: Surveyor, quantity  Tobacco Use  . Smoking status: Never Smoker  . Smokeless tobacco: Never Used  Vaping Use  . Vaping Use: Never used  Substance and Sexual Activity  . Alcohol use: No  . Drug use: No  . Sexual activity: Yes    Birth control/protection: Condom, Surgical  Other Topics Concern  . Not on file  Social History Narrative  . Not on file   Social Determinants of Health   Financial Resource Strain:   . Difficulty of Paying Living Expenses:   Food Insecurity:   . Worried About Charity fundraiser in the Last Year:   . Arboriculturist in the Last Year:   Transportation Needs:   . Film/video editor (Medical):   Marland Kitchen Lack of Transportation (Non-Medical):   Physical Activity:   . Days of Exercise per Week:   .  Minutes of Exercise per Session:   Stress:   . Feeling of Stress :   Social Connections:   . Frequency of Communication with Friends and Family:   . Frequency of Social Gatherings with Friends and Family:   . Attends Religious Services:   . Active Member of Clubs or Organizations:   . Attends Archivist Meetings:   Marland Kitchen Marital Status:   Intimate Partner Violence:   . Fear of Current or Ex-Partner:   . Emotionally Abused:   Marland Kitchen Physically Abused:   . Sexually Abused:      Allergies  Allergen Reactions  . Doxycycline Nausea And Vomiting  . Flagyl [Metronidazole Hcl] Hives and Nausea And Vomiting     Outpatient Medications Prior to Visit  Medication Sig Dispense Refill  . albuterol (PROVENTIL) (2.5 MG/3ML) 0.083% nebulizer solution Take 3 mLs (2.5 mg total) by nebulization every 6 (six) hours as needed for wheezing or shortness of breath. 75 mL 12  . albuterol (VENTOLIN HFA) 108 (90 Base) MCG/ACT inhaler Inhale 1-2 puffs into the lungs every 6 (six) hours as needed for wheezing or shortness of breath. 8 g 3  . amLODipine (NORVASC) 10 MG tablet Take 1 tablet (10 mg total) by mouth daily. 30 tablet 2  .  gabapentin (NEURONTIN) 300 MG capsule Take 1 capsule (300 mg total) by mouth 2 (two) times daily. 60 capsule 0  . hydrochlorothiazide (HYDRODIURIL) 12.5 MG tablet Take 1 tablet (12.5 mg total) by mouth daily. 30 tablet 1  . labetalol (NORMODYNE) 200 MG tablet Take 1 tablet (200 mg total) by mouth 2 (two) times daily. 60 tablet 2  . levothyroxine (SYNTHROID) 25 MCG tablet Take 0.5 tablets (12.5 mcg total) by mouth daily at 6 (six) AM. 15 tablet 2  . montelukast (SINGULAIR) 10 MG tablet Take 1 tablet (10 mg total) by mouth at bedtime. 90 tablet 0  . potassium chloride (KLOR-CON) 20 MEQ packet Take 40 mEq by mouth once for 1 dose. 2 packet 0  . rosuvastatin (CRESTOR) 5 MG tablet Take 0.5 tablets (2.5 mg total) by mouth at bedtime. 30 tablet 0  . Vitamin D, Ergocalciferol, (DRISDOL)  1.25 MG (50000 UNIT) CAPS capsule Take one tablet wkly (Patient taking differently: Take 50,000 Units by mouth every 7 (seven) days. Sunday) 4 capsule 0   No facility-administered medications prior to visit.       Review of Systems     Objective:   Physical Exam There were no vitals filed for this visit.  Gen: Pleasant, well-nourished, in no distress,  normal affect  ENT: No lesions,  mouth clear,  oropharynx clear, no postnasal drip  Neck: No JVD, no TMG, no carotid bruits  Lungs: No use of accessory muscles, no dullness to percussion, clear without rales or rhonchi  Cardiovascular: RRR, heart sounds normal, no murmur or gallops, no peripheral edema  Abdomen: soft and NT, no HSM,  BS normal  Musculoskeletal: No deformities, no cyanosis or clubbing  Neuro: alert, non focal  Skin: Warm, no lesions or rashes       Assessment & Plan:  I personally reviewed all images and lab data in the [facility] system as well as any outside material available during this office visit and agree with the  radiology impressions.   No problem-specific Assessment & Plan notes found for this encounter.   There are no diagnoses linked to this encounter.

## 2020-01-10 ENCOUNTER — Inpatient Hospital Stay: Payer: Medicaid Other | Admitting: Critical Care Medicine

## 2020-01-10 NOTE — Telephone Encounter (Signed)
I will need much more info than what she gave [place].   Please call pt and ask about what her daily BP readings have been,  is she Sx or not, etc. etc.  I can't address this with such little info.

## 2020-01-12 NOTE — Telephone Encounter (Signed)
Call pt. I saw her for Dr. Raliegh Scarlet and asked her to return to see Dr. Raliegh Scarlet in 1 week. She has not returned. What is her blood pressure now?  She was having side effects from the medication that she was on and that is why I changed her medication. She needs to make an appointment to get in with Dr. Raliegh Scarlet or her PCP as soon as possible.

## 2020-01-12 NOTE — Telephone Encounter (Signed)
Please ensure this was addressed.

## 2020-01-12 NOTE — Telephone Encounter (Signed)
Pt reports she was in the hospital for a week being treated for blood clots in lungs.  While there, her BP medication was changed/adjusted and is not a problem at this time.  Pt will f/u with Dr. Willy Eddy

## 2020-01-20 ENCOUNTER — Other Ambulatory Visit: Payer: Self-pay

## 2020-01-21 ENCOUNTER — Other Ambulatory Visit: Payer: Self-pay

## 2020-01-21 ENCOUNTER — Ambulatory Visit: Payer: Medicaid Other | Attending: Family Medicine | Admitting: Pharmacist

## 2020-01-21 DIAGNOSIS — I2699 Other pulmonary embolism without acute cor pulmonale: Secondary | ICD-10-CM | POA: Diagnosis not present

## 2020-01-21 LAB — POCT INR: INR: 2.5 (ref 2.0–3.0)

## 2020-01-21 NOTE — Patient Instructions (Signed)
Vitamin K Foods and Warfarin Warfarin is a blood thinner (anticoagulant). Anticoagulant medicines help prevent the formation of blood clots. These medicines work by decreasing the activity of vitamin K, which promotes normal blood clotting. When you take warfarin, problems can occur from suddenly increasing or decreasing the amount of vitamin K that you eat from one day to the next. Problems may include:  Blood clots.  Bleeding. What general guidelines do I need to follow? To avoid problems when taking warfarin:  Eat a balanced diet that includes: ? Fresh fruits and vegetables. ? Whole grains. ? Low-fat dairy products. ? Lean proteins, such as fish, eggs, and lean cuts of meat.  Keep your intake of vitamin K consistent from day to day. To do this: ? Avoid eating large amounts of vitamin K one day and low amounts of vitamin K the next day. ? If you take a multivitamin that contains vitamin K, be sure to take it every day. ? Know which foods contain vitamin K. Use the lists below to understand serving sizes and the amount of vitamin K in one serving.  Avoid major changes in your diet. If you are going to change your diet, talk with your health care provider before making changes.  Work with a nutrition specialist (dietitian) to develop a meal plan that works best for you.  High vitamin K foods Foods that are high in vitamin K contain more than 100 mcg (micrograms) per serving. These include:  Broccoli (cooked) -  cup has 110 mcg.  Brussels sprouts (cooked) -  cup has 109 mcg.  Greens, beet (cooked) -  cup has 350 mcg.  Greens, collard (cooked) -  cup has 418 mcg.  Greens, turnip (cooked) -  cup has 265 mcg.  Green onions or scallions -  cup has 105 mcg.  Kale (fresh or frozen) -  cup has 531 mcg.  Parsley (raw) - 10 sprigs has 164 mcg.  Spinach (cooked) -  cup has 444 mcg.  Swiss chard (cooked) -  cup has 287 mcg. Moderate vitamin K foods Foods that have a  moderate amount of vitamin K contain 25-100 mcg per serving. These include:  Asparagus (cooked) - 5 spears have 38 mcg.  Black-eyed peas (dried) -  cup has 32 mcg.  Cabbage (cooked) -  cup has 37 mcg.  Kiwi fruit - 1 medium has 31 mcg.  Lettuce - 1 cup has 57-63 mcg.  Okra (frozen) -  cup has 44 mcg.  Prunes (dried) - 5 prunes have 25 mcg.  Watercress (raw) - 1 cup has 85 mcg. Low vitamin K foods Foods low in vitamin K contain less than 25 mcg per serving. These include:  Artichoke - 1 medium has 18 mcg.  Avocado - 1 oz. has 6 mcg.  Blueberries -  cup has 14 mcg.  Cabbage (raw) -  cup has 21 mcg.  Carrots (cooked) -  cup has 11 mcg.  Cauliflower (raw) -  cup has 11 mcg.  Cucumber with peel (raw) -  cup has 9 mcg.  Grapes -  cup has 12 mcg.  Mango - 1 medium has 9 mcg.  Nuts - 1 oz. has 15 mcg.  Pear - 1 medium has 8 mcg.  Peas (cooked) -  cup has 19 mcg.  Pickles - 1 spear has 14 mcg.  Pumpkin seeds - 1 oz. has 13 mcg.  Sauerkraut (canned) -  cup has 16 mcg.  Soybeans (cooked) -  cup has 16 mcg.    Tomato (raw) - 1 medium has 10 mcg.  Tomato sauce -  cup has 17 mcg. Vitamin K-free foods If a food contain less than 5 mcg per serving, it is considered to have no vitamin K. These foods include:  Bread and cereal products.  Cheese.  Eggs.  Fish and shellfish.  Meat and poultry.  Milk and dairy products.  Sunflower seeds. Actual amounts of vitamin K in foods may be different depending on processing. Talk with your dietitian about what foods you can eat and what foods you should avoid. This information is not intended to replace advice given to you by your health care provider. Make sure you discuss any questions you have with your health care provider. Document Revised: 05/02/2017 Document Reviewed: 08/23/2015 Elsevier Patient Education  2020 Elsevier Inc.  

## 2020-02-02 ENCOUNTER — Inpatient Hospital Stay: Payer: Medicaid Other | Admitting: Family Medicine

## 2020-02-02 DIAGNOSIS — Z419 Encounter for procedure for purposes other than remedying health state, unspecified: Secondary | ICD-10-CM | POA: Diagnosis not present

## 2020-02-03 ENCOUNTER — Ambulatory Visit: Payer: Medicaid Other | Attending: Critical Care Medicine | Admitting: Family Medicine

## 2020-02-03 ENCOUNTER — Inpatient Hospital Stay: Payer: Medicaid Other | Admitting: Critical Care Medicine

## 2020-02-03 ENCOUNTER — Other Ambulatory Visit: Payer: Self-pay

## 2020-02-03 ENCOUNTER — Encounter: Payer: Self-pay | Admitting: Family Medicine

## 2020-02-03 VITALS — BP 110/73 | HR 76 | Temp 97.7°F | Ht 61.0 in | Wt 357.0 lb

## 2020-02-03 DIAGNOSIS — I2699 Other pulmonary embolism without acute cor pulmonale: Secondary | ICD-10-CM

## 2020-02-03 DIAGNOSIS — E559 Vitamin D deficiency, unspecified: Secondary | ICD-10-CM

## 2020-02-03 DIAGNOSIS — I10 Essential (primary) hypertension: Secondary | ICD-10-CM

## 2020-02-03 DIAGNOSIS — R5383 Other fatigue: Secondary | ICD-10-CM

## 2020-02-03 DIAGNOSIS — Z7901 Long term (current) use of anticoagulants: Secondary | ICD-10-CM | POA: Diagnosis not present

## 2020-02-03 DIAGNOSIS — N1831 Chronic kidney disease, stage 3a: Secondary | ICD-10-CM | POA: Diagnosis not present

## 2020-02-03 DIAGNOSIS — R7303 Prediabetes: Secondary | ICD-10-CM

## 2020-02-03 DIAGNOSIS — E039 Hypothyroidism, unspecified: Secondary | ICD-10-CM

## 2020-02-03 DIAGNOSIS — Z09 Encounter for follow-up examination after completed treatment for conditions other than malignant neoplasm: Secondary | ICD-10-CM | POA: Diagnosis not present

## 2020-02-03 NOTE — Progress Notes (Signed)
Subjective:  Patient ID: Angela [name], female    DOB: 02-04-1976  Age: 44 y.o. MRN: [id number]  CC: Hospitalization Follow-up (Pt. is here for HFU. Pt. feels like she don't have any energy. )   HPI Angela [name], 44 year old African-American female, who is status post hospitalization at [facility] from 12/30/2019 through 01/06/2020 due to acute bilateral pulmonary embolism and was also found to have acute kidney injury in addition to chronic issues of hypertension, asthma, prediabetes, GERD, IBS, peripheral neuropathy and hypothyroidism.  During hospitalization, CT angiogram showed acute bilateral pulmonary embolus with large clot burden and evidence of right heart strain involving both main pulmonary arteries extending into all lobar and many segmental and subsegmental branches.  Venous Dopplers negative for DVT.  Patient was discharged on Coumadin and has had INR on 01/05/2020 by clinical pharmacist which was subtherapeutic at 1.7.       Patient presents with complaint at today's visit fatigue/lack of energy.  She denies any unusual bruising or bleeding related to her use of Coumadin.  She has had menstrual period x1 while on Coumadin but denies heavy/excessive bleeding.  She denies any further increased shortness of breath or chest pain since her hospitalization.  She has noted no increase in peripheral edema.  She denies change in appetite.  No unexplained weight loss or weight gain.  No increased thirst or urinary frequency.  On review of medications, patient is not taking her thyroid medication.  She additionally has a history of vitamin D deficiency and is not on vitamin D replacement therapy.  She reports compliance with her blood pressure medications.  Past Medical History:  Diagnosis Date  . Allergies   . Asthma   . Back pain   . Depression    hx, doing ok now  . Edema, lower extremity   . GERD (gastroesophageal reflux disease)   . Headache(784.0)   . Heart  murmur   . Hypertension    on meds  . IBS (irritable bowel syndrome)   . Joint pain   . Neuropathy   . Obesity   . Osteoarthritis   . Ovarian cyst   . Prediabetes   . Preterm labor   . Vitamin D deficiency     Past Surgical History:  Procedure Laterality Date  . stye removal   1994  . TUBAL LIGATION  1998    Family History  Problem Relation Age of Onset  . Hyperlipidemia Mother   . Hypertension Mother   . Thyroid disease Mother   . Obesity Mother   . Clotting disorder Mother   . Cancer Son   . Hypertension Other   . Diabetes Other   . Other Neg Hx     Social History   Tobacco Use  . Smoking status: Never Smoker  . Smokeless tobacco: Never Used  Substance Use Topics  . Alcohol use: No    ROS Review of Systems  Constitutional: Positive for fatigue. Negative for chills and fever.  HENT: Negative for sore throat and trouble swallowing.   Eyes: Negative for photophobia and visual disturbance.  Respiratory: Negative for cough and shortness of breath.   Cardiovascular: Positive for leg swelling (Chronic, no increased swelling). Negative for chest pain and palpitations.  Gastrointestinal: Negative for abdominal pain, anal bleeding, constipation, diarrhea and nausea.  Endocrine: Negative for polydipsia, polyphagia and polyuria.  Genitourinary: Negative for dysuria, frequency and hematuria.  Musculoskeletal: Positive for arthralgias and gait problem.  Neurological: Negative for dizziness, weakness  and light-headedness.  Hematological: Negative for adenopathy. Does not bruise/bleed easily.    Objective:   Today's Vitals: BP 110/73 (BP Location: Left Arm, Patient Position: Sitting, Cuff Size: Large) Comment (Cuff Size): thigh cuff  Pulse 76   Temp 97.7 F (36.5 C) (Temporal)   Ht 5\' 1"  (1.549 m)   Wt (!) 357 lb (161.9 kg)   SpO2 94%   BMI 67.45 kg/m   Physical Exam Constitutional:      General: She is not in acute distress.    Appearance: Normal appearance.  She is obese.  Neck:     Vascular: No carotid bruit.  Cardiovascular:     Rate and Rhythm: Normal rate and regular rhythm.     Comments: Heart sounds are very difficult to auscultate likely due to body habitus Pulmonary:     Effort: Pulmonary effort is normal.     Breath sounds: Normal breath sounds. No wheezing, rhonchi or rales.  Abdominal:     Tenderness: There is no abdominal tenderness. There is no right CVA tenderness, left CVA tenderness, guarding or rebound.  Musculoskeletal:        General: No tenderness.     Cervical back: Neck supple.     Right lower leg: Edema present.     Left lower leg: Edema present.     Comments: Bilateral nonpitting distal lower extremity edema  Lymphadenopathy:     Cervical: No cervical adenopathy.  Skin:    General: Skin is warm and dry.  Neurological:     General: No focal deficit present.     Mental Status: She is alert and oriented to person, place, and time.  Psychiatric:        Mood and Affect: Mood normal.        Behavior: Behavior normal.     Assessment & Plan:  1. Bilateral pulmonary embolism (HCC); Hospital discharge follow-up Patient status post hospitalization from 12/30/2019 through 01/08/2020 due to bilateral pulmonary embolism.  Patient is currently on Coumadin and she reports compliance with medication.  She is to keep follow-up with clinical pharmacist for monitoring of Coumadin/INR.  Per chart, her most recent INR was subtherapeutic at 1.7.  Patient did have evidence of right heart strain due to pulmonary embolism during hospitalization and patient reports continued fatigue.  Patient will be referred to cardiology for further evaluation of right heart strain status post pulmonary embolism.  Patient also has longstanding issues with hypertension and morbid obesity and may have some element of heart failure for which she needs further evaluation.  Patient is to schedule follow-up with her primary care provider in approximately 4 weeks  but sooner if she has worsening of fatigue or any other concerns.  Patient does have family history of mother with blood clot and patient per hospital discharge summary may require long-term anticoagulation. - Ambulatory referral to Cardiology - Protime-INR  2. Fatigue, unspecified type; 3.  Hypothyroidism; 4.  Vitamin D deficiency; 6.  Morbid obesity; 8.  Prediabetes Patient with multiple factors for her current issues with fatigue including recent hospitalization for bilateral pulmonary embolism with right heart strain, morbid obesity, hypothyroidism for which she is not currently taking prescribed thyroid medication as well as history of vitamin D deficiency.  Patient has been referred to cardiology in follow-up of recent bilateral pulmonary embolism with right heart strain as well as evaluation for possible heart failure.  During her recent hospitalization she had some mild hypokalemia and will have repeat basic metabolic panel.  Patient additionally  with prediabetes which she will have repeat glucose as part of basic metabolic panel.  She will have CBC to look for anemia due to her use of Coumadin.  She will be notified of the results and she has been asked to start thyroid replacement therapy and will be notified if a change in dose is needed.  When patient follows up with her primary care provider, she should also have referral to sleep medicine for evaluation of sleep apnea due to obesity and body habitus and complaints of fatigue.  She may also benefit from medical weight loss program.  Educational material on prediabetes given as part of AVS. - Ambulatory referral to Cardiology - Basic Metabolic Panel - CBC with Differential - T4 AND TSH - Vitamin D, 25-hydroxy   5. Long term current use of anticoagulant Patient is currently on Coumadin and INR is being managed by the clinical pharmacist.  INR goal is 2-3.  Patient will have CBC to look for any anemia/blood loss related to her use of  long-term anticoagulant. - CBC with Differential  7. Chronic kidney disease (CKD) stage G3a/A1, moderately decreased glomerular filtration rate (GFR) between 45-59 mL/min/1.73 square meter and albuminuria creatinine ratio less than 30 mg/g; Hypertension During recent hospitalization patient with acute kidney injury with creatinine of 1.69, BUN of 26 and GFR 41 which improved to a creatinine of 1.28, BUN of 11 and EGFR 58 on 01/05/2020.  She will have repeat BMP at today's visit and follow-up of chronic kidney disease as well as CBC to look for anemia related to chronic kidney disease.  She was on Mobic prior to hospital admission which has now been discontinued.  She is to continue to avoid nonsteroidal anti-inflammatories not only due to her chronic kidney disease but also due to use of anticoagulant medication.  She is to continue to remain compliant with medications to control blood pressure. - Basic Metabolic Panel - CBC with Differential  8. Prediabetes Patient with prediabetes with hemoglobin A1c of 5.8 on 11/25/2019 but A1c has been higher in the past.  She is to continue low carbohydrate diet and increase activity as tolerated.  She will have basic metabolic panel at today's visit. - Basic Metabolic Panel    Outpatient Encounter Medications as of 02/03/2020  Medication Sig  . albuterol (PROVENTIL) (2.5 MG/3ML) 0.083% nebulizer solution Take 3 mLs (2.5 mg total) by nebulization every 6 (six) hours as needed for wheezing or shortness of breath.  Marland Kitchen albuterol (VENTOLIN HFA) 108 (90 Base) MCG/ACT inhaler Inhale 1-2 puffs into the lungs every 6 (six) hours as needed for wheezing or shortness of breath.  Marland Kitchen amLODipine (NORVASC) 10 MG tablet Take 1 tablet (10 mg total) by mouth daily.  . hydrochlorothiazide (HYDRODIURIL) 12.5 MG tablet Take 1 tablet (12.5 mg total) by mouth daily.  Marland Kitchen labetalol (NORMODYNE) 200 MG tablet Take 1 tablet (200 mg total) by mouth 2 (two) times daily.  Marland Kitchen warfarin (COUMADIN) 5  MG tablet Take 5 mg by mouth daily.  Marland Kitchen gabapentin (NEURONTIN) 300 MG capsule Take 1 capsule (300 mg total) by mouth 2 (two) times daily. (Patient not taking: Reported on 02/03/2020)  . levothyroxine (SYNTHROID) 25 MCG tablet Take 0.5 tablets (12.5 mcg total) by mouth daily at 6 (six) AM. (Patient not taking: Reported on 02/03/2020)  . montelukast (SINGULAIR) 10 MG tablet Take 1 tablet (10 mg total) by mouth at bedtime. (Patient not taking: Reported on 02/03/2020)  . potassium chloride (KLOR-CON) 20 MEQ packet Take 40 mEq  by mouth once for 1 dose.  . potassium chloride SA (KLOR-CON) 20 MEQ tablet Take 20 mEq by mouth 2 (two) times daily. (Patient not taking: Reported on 02/03/2020)  . rosuvastatin (CRESTOR) 5 MG tablet Take 0.5 tablets (2.5 mg total) by mouth at bedtime. (Patient not taking: Reported on 02/03/2020)  . Vitamin D, Ergocalciferol, (DRISDOL) 1.25 MG (50000 UNIT) CAPS capsule Take one tablet wkly (Patient not taking: Reported on 02/03/2020)   No facility-administered encounter medications on file as of 02/03/2020.      Follow-up: Return in about 4 weeks (around 03/02/2020) for chronic issues with PCP; f/u with [facility] for INR.   Antony Blackbird MD

## 2020-02-03 NOTE — Patient Instructions (Signed)
Chronic Kidney Disease, Adult Chronic kidney disease (CKD) happens when the kidneys are damaged over a long period of time. The kidneys are two organs that help with:  Getting rid of waste and extra fluid from the blood.  Making hormones that maintain the amount of fluid in your tissues and blood vessels.  Making sure that the body has the right amount of fluids and chemicals. Most of the time, CKD does not go away, but it can usually be controlled. Steps must be taken to slow down the kidney damage or to stop it from getting worse. If this is not done, the kidneys may stop working. Follow these instructions at home: Medicines  Take over-the-counter and prescription medicines only as told by your doctor. You may need to change the amount of medicines you take.  Do not take any new medicines unless your doctor says it is okay. Many medicines can make your kidney damage worse.  Do not take any vitamin and supplements unless your doctor says it is okay. Many vitamins and supplements can make your kidney damage worse. General instructions  Follow a diet as told by your doctor. You may need to stay away from: ? Alcohol. ? Salty foods. ? Foods that are high in:  Potassium.  Calcium.  Protein.  Do not use any products that contain nicotine or tobacco, such as cigarettes and e-cigarettes. If you need help quitting, ask your doctor.  Keep track of your blood pressure at home. Tell your doctor about any changes.  If you have diabetes, keep track of your blood sugar as told by your doctor.  Try to stay at a healthy weight. If you need help, ask your doctor.  Exercise at least 30 minutes a day, 5 days a week.  Stay up-to-date with your shots (immunizations) as told by your doctor.  Keep all follow-up visits as told by your doctor. This is important. Contact a doctor if:  Your symptoms get worse.  You have new symptoms. Get help right away if:  You have symptoms of end-stage  kidney disease. These may include: ? Headaches. ? Numbness in your hands or feet. ? Easy bruising. ? Having hiccups often. ? Chest pain. ? Shortness of breath. ? Stopping of menstrual periods in women.  You have a fever.  You have very little pee (urine).  You have pain or bleeding when you pee. Summary  Chronic kidney disease (CKD) happens when the kidneys are damaged over a long period of time.  Most of the time, this condition does not go away, but it can usually be controlled. Steps must be taken to slow down the kidney damage or to stop it from getting worse.  Treatment may include a combination of medicines and lifestyle changes. This information is not intended to replace advice given to you by your health care provider. Make sure you discuss any questions you have with your health care provider. Document Revised: 05/02/2017 Document Reviewed: 06/24/2016 Elsevier Patient Education  [place].  Chronic Kidney Disease, Adult Chronic kidney disease (CKD) happens when the kidneys are damaged over a long period of time. The kidneys are two organs that help with:  Getting rid of waste and extra fluid from the blood.  Making hormones that maintain the amount of fluid in your tissues and blood vessels.  Making sure that the body has the right amount of fluids and chemicals. Most of the time, CKD does not go away, but it can usually be controlled. Steps  must be taken to slow down the kidney damage or to stop it from getting worse. If this is not done, the kidneys may stop working. Follow these instructions at home: Medicines  Take over-the-counter and prescription medicines only as told by your doctor. You may need to change the amount of medicines you take.  Do not take any new medicines unless your doctor says it is okay. Many medicines can make your kidney damage worse.  Do not take any vitamin and supplements unless your doctor says it is okay. Many vitamins and  supplements can make your kidney damage worse. General instructions  Follow a diet as told by your doctor. You may need to stay away from: ? Alcohol. ? Salty foods. ? Foods that are high in:  Potassium.  Calcium.  Protein.  Do not use any products that contain nicotine or tobacco, such as cigarettes and e-cigarettes. If you need help quitting, ask your doctor.  Keep track of your blood pressure at home. Tell your doctor about any changes.  If you have diabetes, keep track of your blood sugar as told by your doctor.  Try to stay at a healthy weight. If you need help, ask your doctor.  Exercise at least 30 minutes a day, 5 days a week.  Stay up-to-date with your shots (immunizations) as told by your doctor.  Keep all follow-up visits as told by your doctor. This is important. Contact a doctor if:  Your symptoms get worse.  You have new symptoms. Get help right away if:  You have symptoms of end-stage kidney disease. These may include: ? Headaches. ? Numbness in your hands or feet. ? Easy bruising. ? Having hiccups often. ? Chest pain. ? Shortness of breath. ? Stopping of menstrual periods in women.  You have a fever.  You have very little pee (urine).  You have pain or bleeding when you pee. Summary  Chronic kidney disease (CKD) happens when the kidneys are damaged over a long period of time.  Most of the time, this condition does not go away, but it can usually be controlled. Steps must be taken to slow down the kidney damage or to stop it from getting worse.  Treatment may include a combination of medicines and lifestyle changes. This information is not intended to replace advice given to you by your health care provider. Make sure you discuss any questions you have with your health care provider. Document Revised: 05/02/2017 Document Reviewed: 06/24/2016 Elsevier Patient Education  2020 [place].  Preventing Type 2 Diabetes Mellitus Type 2 diabetes  (type 2 diabetes mellitus) is a long-term (chronic) disease that affects blood sugar (glucose) levels. Normally, a hormone called insulin allows glucose to enter cells in the body. The cells use glucose for energy. In type 2 diabetes, one or both of these problems may be present:  The body does not make enough insulin.  The body does not respond properly to insulin that it makes (insulin resistance). Insulin resistance or lack of insulin causes excess glucose to build up in the blood instead of going into cells. As a result, high blood glucose (hyperglycemia) develops, which can cause many complications. Being overweight or obese and having an inactive (sedentary) lifestyle can increase your risk for diabetes. Type 2 diabetes can be delayed or prevented by making certain nutrition and lifestyle changes. What nutrition changes can be made?   Eat healthy meals and snacks regularly. Keep a healthy snack with you for when you get hungry between  meals, such as fruit or a handful of nuts.  Eat lean meats and proteins that are low in saturated fats, such as chicken, fish, egg whites, and beans. Avoid processed meats.  Eat plenty of fruits and vegetables and plenty of grains that have not been processed (whole grains). It is recommended that you eat: ? 1?2 cups of fruit every day. ? 2?3 cups of vegetables every day. ? 6?8 oz of whole grains every day, such as oats, whole wheat, bulgur, brown rice, quinoa, and millet.  Eat low-fat dairy products, such as milk, yogurt, and cheese.  Eat foods that contain healthy fats, such as nuts, avocado, olive oil, and canola oil.  Drink water throughout the day. Avoid drinks that contain added sugar, such as soda or sweet tea.  Follow instructions from your health care provider about specific eating or drinking restrictions.  Control how much food you eat at a time (portion size). ? Check food labels to find out the serving sizes of foods. ? Use a kitchen  scale to weigh amounts of foods.  Saute or steam food instead of frying it. Cook with water or broth instead of oils or butter.  Limit your intake of: ? Salt (sodium). Have no more than 1 tsp (2,400 mg) of sodium a day. If you have heart disease or high blood pressure, have less than ? tsp (1,500 mg) of sodium a day. ? Saturated fat. This is fat that is solid at room temperature, such as butter or fat on meat. What lifestyle changes can be made? Activity   Do moderate-intensity physical activity for at least 30 minutes on at least 5 days of the week, or as much as told by your health care provider.  Ask your health care provider what activities are safe for you. A mix of physical activities may be best, such as walking, swimming, cycling, and strength training.  Try to add physical activity into your day. For example: ? Park in spots that are farther away than usual, so that you walk more. For example, park in a far corner of the parking lot when you go to the office or the grocery store. ? Take a walk during your lunch break. ? Use stairs instead of elevators or escalators. Weight Loss  Lose weight as directed. Your health care provider can determine how much weight loss is best for you and can help you lose weight safely.  If you are overweight or obese, you may be instructed to lose at least 5?7 % of your body weight. Alcohol and Tobacco   Limit alcohol intake to no more than 1 drink a day for nonpregnant women and 2 drinks a day for men. One drink equals 12 oz of beer, 5 oz of wine, or 1 oz of hard liquor.  Do not use any tobacco products, such as cigarettes, chewing tobacco, and e-cigarettes. If you need help quitting, ask your health care provider. Work With [place] Provider  Have your blood glucose tested regularly, as told by your health care provider.  Discuss your risk factors and how you can reduce your risk for diabetes.  Get screening tests as told by your  health care provider. You may have screening tests regularly, especially if you have certain risk factors for type 2 diabetes.  Make an appointment with a diet and nutrition specialist (registered dietitian). A registered dietitian can help you make a healthy eating plan and can help you understand portion sizes and food labels.  Why are these changes important?  It is possible to prevent or delay type 2 diabetes and related health problems by making lifestyle and nutrition changes.  It can be difficult to recognize signs of type 2 diabetes. The best way to avoid possible damage to your body is to take actions to prevent the disease before you develop symptoms. What can happen if changes are not made?  Your blood glucose levels may keep increasing. Having high blood glucose for a long time is dangerous. Too much glucose in your blood can damage your blood vessels, heart, kidneys, nerves, and eyes.  You may develop prediabetes or type 2 diabetes. Type 2 diabetes can lead to many chronic health problems and complications, such as: ? Heart disease. ? Stroke. ? Blindness. ? Kidney disease. ? Depression. ? Poor circulation in the feet and legs, which could lead to surgical removal (amputation) in severe cases. Where to find support  Ask your health care provider to recommend a registered dietitian, diabetes educator, or weight loss program.  Look for local or online weight loss groups.  Join a gym, fitness club, or outdoor activity group, such as a walking club. Where to find more information To learn more about diabetes and diabetes prevention, visit:  American Diabetes Association (ADA): www.diabetes.[employer] of Diabetes and Digestive and Kidney Diseases: FindSpin.nl To learn more about healthy eating, visit:  The U.S. Department of Agriculture Scientist, research (physical sciences)), Choose My Plate: http://wiley-williams.com/  Office of Disease Prevention and  Health Promotion (ODPHP), Dietary Guidelines: SurferLive.at Summary  You can reduce your risk for type 2 diabetes by increasing your physical activity, eating healthy foods, and losing weight as directed.  Talk with your health care provider about your risk for type 2 diabetes. Ask about any blood tests or screening tests that you need to have. This information is not intended to replace advice given to you by your health care provider. Make sure you discuss any questions you have with your health care provider. Document Revised: 09/11/2018 Document Reviewed: 07/11/2015 Elsevier Patient Education  [place].  Prediabetes Prediabetes is the condition of having a blood sugar (blood glucose) level that is higher than it should be, but not high enough for you to be diagnosed with type 2 diabetes. Having prediabetes puts you at risk for developing type 2 diabetes (type 2 diabetes mellitus). Prediabetes may be called impaired glucose tolerance or impaired fasting glucose. Prediabetes usually does not cause symptoms. Your health care provider can diagnose this condition with blood tests. You may be tested for prediabetes if you are overweight and if you have at least one other risk factor for prediabetes. What is blood glucose, and how is it measured? Blood glucose refers to the amount of glucose in your bloodstream. Glucose comes from eating foods that contain sugars and starches (carbohydrates), which the body breaks down into glucose. Your blood glucose level may be measured in mg/dL (milligrams per deciliter) or mmol/L (millimoles per liter). Your blood glucose may be checked with one or more of the following blood tests:  A fasting blood glucose (FBG) test. You will not be allowed to eat (you will fast) for 8 hours or longer before a blood sample is taken. ? A normal range for FBG is 70-100 mg/dl (3.9-5.6 mmol/L).  An A1c (hemoglobin A1c) blood test. This test provides  information about blood glucose control over the previous 2?28months.  An oral glucose tolerance test (OGTT). This test measures your blood glucose at two times: ?  After fasting. This is your baseline level. ? Two hours after you drink a beverage that contains glucose. You may be diagnosed with prediabetes:  If your FBG is 100?125 mg/dL (5.6-6.9 mmol/L).  If your A1c level is 5.7?6.4%.  If your OGTT result is 140?199 mg/dL (7.8-11 mmol/L). These blood tests may be repeated to confirm your diagnosis. How can this condition affect me? The pancreas produces a hormone (insulin) that helps to move glucose from the bloodstream into cells. When cells in the body do not respond properly to insulin that the body makes (insulin resistance), excess glucose builds up in the blood instead of going into cells. As a result, high blood glucose (hyperglycemia) can develop, which can cause many complications. Hyperglycemia is a symptom of prediabetes. Having high blood glucose for a long time is dangerous. Too much glucose in your blood can damage your nerves and blood vessels. Long-term damage can lead to complications from diabetes, which may include:  Heart disease.  Stroke.  Blindness.  Kidney disease.  Depression.  Poor circulation in the feet and legs, which could lead to surgical removal (amputation) in severe cases. What can increase my risk? Risk factors for prediabetes include:  Having a family member with type 2 diabetes.  Being overweight or obese.  Being older than age 62.  Being of American [place], African-American, Hispanic/Latino, or Asian/Pacific Islander descent.  Having an inactive (sedentary) lifestyle.  Having a history of heart disease.  History of gestational diabetes or polycystic ovary syndrome (PCOS), in women.  Having low levels of good cholesterol (HDL-C) or high levels of blood fats (triglycerides).  Having high blood pressure. What actions can I take to  prevent diabetes?      Be physically active. ? Do moderate-intensity physical activity for 30 or more minutes on 5 or more days of the week, or as much as told by your health care provider. This could be brisk walking, biking, or water aerobics. ? Ask your health care provider what activities are safe for you. A mix of physical activities may be best, such as walking, swimming, cycling, and strength training.  Lose weight as told by your health care provider. ? Losing 5-7% of your body weight can reverse insulin resistance. ? Your health care provider can determine how much weight loss is best for you and can help you lose weight safely.  Follow a healthy meal plan. This includes eating lean proteins, complex carbohydrates, fresh fruits and vegetables, low-fat dairy products, and healthy fats. ? Follow instructions from your health care provider about eating or drinking restrictions. ? Make an appointment to see a diet and nutrition specialist (registered dietitian) to help you create a healthy eating plan that is right for you.  Do not smoke or use any tobacco products, such as cigarettes, chewing tobacco, and e-cigarettes. If you need help quitting, ask your health care provider.  Take over-the-counter and prescription medicines as told by your health care provider. You may be prescribed medicines that help lower the risk of type 2 diabetes.  Keep all follow-up visits as told by your health care provider. This is important. Summary  Prediabetes is the condition of having a blood sugar (blood glucose) level that is higher than it should be, but not high enough for you to be diagnosed with type 2 diabetes.  Having prediabetes puts you at risk for developing type 2 diabetes (type 2 diabetes mellitus).  To help prevent type 2 diabetes, make lifestyle changes such as being physically  active and eating a healthy diet. Lose weight as told by your health care provider. This information is not  intended to replace advice given to you by your health care provider. Make sure you discuss any questions you have with your health care provider. Document Revised: 09/11/2018 Document Reviewed: 07/11/2015 Elsevier Patient Education  [place].

## 2020-02-04 ENCOUNTER — Ambulatory Visit: Payer: Medicaid Other | Admitting: Pharmacist

## 2020-02-04 LAB — CBC WITH DIFFERENTIAL/PLATELET
Basophils Absolute: 0 x10E3/uL (ref 0.0–0.2)
Basos: 1 %
EOS (ABSOLUTE): 0.3 x10E3/uL (ref 0.0–0.4)
Eos: 7 %
Hematocrit: 38.7 % (ref 34.0–46.6)
Hemoglobin: 12.4 g/dL (ref 11.1–15.9)
Immature Grans (Abs): 0 x10E3/uL (ref 0.0–0.1)
Immature Granulocytes: 0 %
Lymphocytes Absolute: 1.7 x10E3/uL (ref 0.7–3.1)
Lymphs: 34 %
MCH: 27.1 pg (ref 26.6–33.0)
MCHC: 32 g/dL (ref 31.5–35.7)
MCV: 85 fL (ref 79–97)
Monocytes Absolute: 0.6 x10E3/uL (ref 0.1–0.9)
Monocytes: 11 %
Neutrophils Absolute: 2.5 x10E3/uL (ref 1.4–7.0)
Neutrophils: 47 %
Platelets: 317 x10E3/uL (ref 150–450)
RBC: 4.58 x10E6/uL (ref 3.77–5.28)
RDW: 13.3 % (ref 11.7–15.4)
WBC: 5.2 x10E3/uL (ref 3.4–10.8)

## 2020-02-04 LAB — BASIC METABOLIC PANEL WITH GFR
BUN/Creatinine Ratio: 14 (ref 9–23)
BUN: 17 mg/dL (ref 6–24)
CO2: 28 mmol/L (ref 20–29)
Calcium: 9.8 mg/dL (ref 8.7–10.2)
Chloride: 103 mmol/L (ref 96–106)
Creatinine, Ser: 1.18 mg/dL — ABNORMAL HIGH (ref 0.57–1.00)
GFR calc Af Amer: 64 mL/min/1.73
GFR calc non Af Amer: 55 mL/min/1.73 — ABNORMAL LOW
Glucose: 97 mg/dL (ref 65–99)
Potassium: 3.8 mmol/L (ref 3.5–5.2)
Sodium: 145 mmol/L — ABNORMAL HIGH (ref 134–144)

## 2020-02-04 LAB — T4 AND TSH
T4, Total: 12.7 ug/dL — ABNORMAL HIGH (ref 4.5–12.0)
TSH: 3.29 u[IU]/mL (ref 0.450–4.500)

## 2020-02-04 LAB — VITAMIN D 25 HYDROXY (VIT D DEFICIENCY, FRACTURES): Vit D, 25-Hydroxy: 15.5 ng/mL — ABNORMAL LOW (ref 30.0–100.0)

## 2020-02-04 LAB — PROTIME-INR
INR: 2.6 — ABNORMAL HIGH (ref 0.9–1.2)
Prothrombin Time: 26.1 s — ABNORMAL HIGH (ref 9.1–12.0)

## 2020-02-11 ENCOUNTER — Other Ambulatory Visit: Payer: Self-pay | Admitting: Family Medicine

## 2020-02-11 DIAGNOSIS — E559 Vitamin D deficiency, unspecified: Secondary | ICD-10-CM

## 2020-02-11 MED ORDER — VITAMIN D (ERGOCALCIFEROL) 1.25 MG (50000 UNIT) PO CAPS: ORAL_CAPSULE | ORAL | 0 refills | Status: DC

## 2020-02-14 MED FILL — VIT D2 1.25 MG (50,000 UNIT: 1.25 MG | 84 days supply | Qty: 12 | Fill #0

## 2020-02-17 ENCOUNTER — Ambulatory Visit: Payer: Medicaid Other | Admitting: Pharmacist

## 2020-02-29 ENCOUNTER — Ambulatory Visit: Payer: Medicaid Other | Admitting: Internal Medicine

## 2020-03-03 ENCOUNTER — Ambulatory Visit: Payer: Medicaid Other | Admitting: Internal Medicine

## 2020-03-03 DIAGNOSIS — Z419 Encounter for procedure for purposes other than remedying health state, unspecified: Secondary | ICD-10-CM | POA: Diagnosis not present

## 2020-03-03 NOTE — Progress Notes (Deleted)
Cardiology Office Note:    Date:  03/03/2020   ID:  Angela, [name] [birth year], MRN [id number]  PCP:  Ladell Pier, MD  [place] Cardiologist:  No primary care provider on file.  [facility] Electrophysiologist:  None   Referring MD: Antony Blackbird, MD   CC: Consulted for the evaluation of bilateral pulmonary embolisms at the behest of Ladell Pier, MD   History of Present Illness:    Angela [name] is a 44 y.o. female with a hx of HTN, Bilateral PE, Morbid Obesity, who presents to establish care.  Patient notes ***>  Past Medical History:  Diagnosis Date  . Allergies   . Asthma   . Back pain   . Depression    hx, doing ok now  . Edema, lower extremity   . GERD (gastroesophageal reflux disease)   . Headache(784.0)   . Heart murmur   . Hypertension    on meds  . IBS (irritable bowel syndrome)   . Joint pain   . Neuropathy   . Obesity   . Osteoarthritis   . Ovarian cyst   . Prediabetes   . Preterm labor   . Vitamin D deficiency     Past Surgical History:  Procedure Laterality Date  . stye removal   1994  . TUBAL LIGATION  1998    Current Medications: No outpatient medications have been marked as taking for the 03/03/20 encounter (Appointment) with Werner Lean, MD.     Allergies:   Doxycycline and Flagyl [metronidazole hcl]   Social History   Socioeconomic History  . Marital status: Single    Spouse name: Not on file  . Number of children: Not on file  . Years of education: Not on file  . Highest education level: Not on file  Occupational History  . Occupation: Surveyor, quantity  Tobacco Use  . Smoking status: Never Smoker  . Smokeless tobacco: Never Used  Vaping Use  . Vaping Use: Never used  Substance and Sexual Activity  . Alcohol use: No  . Drug use: No  . Sexual activity: Yes    Birth control/protection: Condom, Surgical  Other Topics Concern  . Not on file  Social History Narrative  . Not on  file   Social Determinants of Health   Financial Resource Strain:   . Difficulty of Paying Living Expenses: Not on file  Food Insecurity:   . Worried About Charity fundraiser in the Last Year: Not on file  . Ran Out of Food in the Last Year: Not on file  Transportation Needs:   . Lack of Transportation (Medical): Not on file  . Lack of Transportation (Non-Medical): Not on file  Physical Activity:   . Days of Exercise per Week: Not on file  . Minutes of Exercise per Session: Not on file  Stress:   . Feeling of Stress : Not on file  Social Connections:   . Frequency of Communication with Friends and Family: Not on file  . Frequency of Social Gatherings with Friends and Family: Not on file  . Attends Religious Services: Not on file  . Active Member of Clubs or Organizations: Not on file  . Attends Archivist Meetings: Not on file  . Marital Status: Not on file     Family History: The patient's ***family history includes Cancer in her son; Clotting disorder in her mother; Diabetes in an other family member; Hyperlipidemia in her mother; Hypertension in her  mother and another family member; Obesity in her mother; Thyroid disease in her mother. There is no history of Other.  ROS:   Please see the history of present illness.    *** All other systems reviewed and are negative.  EKGs/Labs/Other Studies Reviewed:    The following studies were reviewed today: ***  EKG:  EKG is *** ordered today.  The ekg ordered today demonstrates ***  Recent Labs: 12/30/2019: ALT 40 12/31/2019: B Natriuretic Peptide 1,164.3 02/03/2020: BUN 17; Creatinine, Ser 1.18; Hemoglobin 12.4; Platelets 317; Potassium 3.8; Sodium 145; TSH 3.290  Recent Lipid Panel    Component Value Date/Time   CHOL 180 11/25/2019 1254   TRIG 74 11/25/2019 1254   HDL 51 11/25/2019 1254   CHOLHDL 3.5 11/25/2019 1254   LDLCALC 115 (H) 11/25/2019 1254   IMPRESSIONS   12/31/19: Echo at time of PE 1. Left  ventricular ejection fraction, by estimation, is 60 to 65%. The  left ventricle has normal function. The left ventricle has no regional  wall motion abnormalities. There is moderate left ventricular hypertrophy  and severe basal hypertrophy.  2. Right ventricular systolic function is normal. The right ventricular  size is normal. There is severely elevated pulmonary artery systolic  pressure. The estimated right ventricular systolic pressure is 82.4 mmHg.  3. The mitral valve is normal in structure. No evidence of mitral valve  regurgitation. No evidence of mitral stenosis.  4. Tricuspid valve regurgitation is mild to moderate.  5. The aortic valve is normal in structure. Aortic valve regurgitation is  not visualized. No aortic stenosis is present.  6. The inferior vena cava is dilated in size with >50% respiratory  variability, suggesting right atrial pressure of 8 mmHg.   Physical Exam:    VS:  There were no vitals taken for this visit.    Wt Readings from Last 3 Encounters:  02/03/20 (!) 357 lb (161.9 kg)  12/31/19 (!) 371 lb 0.6 oz (168.3 kg)  12/23/19 (!) 360 lb (163.3 kg)     GEN: *** Well nourished, well developed in no acute distress HEENT: Normal NECK: No JVD; No carotid bruits LYMPHATICS: No lymphadenopathy CARDIAC: ***RRR, no murmurs, rubs, gallops RESPIRATORY:  Clear to auscultation without rales, wheezing or rhonchi  ABDOMEN: Soft, non-tender, non-distended MUSCULOSKELETAL:  No edema; No deformity  SKIN: Warm and dry NEUROLOGIC:  Alert and oriented x 3 PSYCHIATRIC:  Normal affect   ASSESSMENT:    No diagnosis found. PLAN:    In order of problems listed above:  1. *   Medication Adjustments/Labs and Tests Ordered: Current medicines are reviewed at length with the patient today.  Concerns regarding medicines are outlined above.  No orders of the defined types were placed in this encounter.  No orders of the defined types were placed in this  encounter.   There are no Patient Instructions on file for this visit.   Signed, Werner Lean, MD  03/03/2020 2:42 PM    [facility]

## 2020-03-07 ENCOUNTER — Other Ambulatory Visit: Payer: Self-pay

## 2020-03-07 ENCOUNTER — Ambulatory Visit: Payer: Medicaid Other | Attending: Internal Medicine | Admitting: Pharmacist

## 2020-03-07 DIAGNOSIS — I2699 Other pulmonary embolism without acute cor pulmonale: Secondary | ICD-10-CM

## 2020-03-07 LAB — POCT INR: INR: 3 (ref 2.0–3.0)

## 2020-03-07 MED FILL — PROAIR HFA 90 MCG INHALER: 108 (90 BAS | 25 days supply | Qty: 9 | Fill #1

## 2020-03-07 MED FILL — VIT D2 1.25 MG (50,000 UNIT: 1.25 MG | 84 days supply | Qty: 12 | Fill #0

## 2020-03-09 ENCOUNTER — Ambulatory Visit: Payer: Medicaid Other | Admitting: Physician Assistant

## 2020-03-14 ENCOUNTER — Other Ambulatory Visit (INDEPENDENT_AMBULATORY_CARE_PROVIDER_SITE_OTHER): Payer: Self-pay | Admitting: Bariatrics

## 2020-03-20 ENCOUNTER — Emergency Department (HOSPITAL_COMMUNITY): Payer: Medicaid Other

## 2020-03-20 ENCOUNTER — Other Ambulatory Visit: Payer: Self-pay

## 2020-03-20 ENCOUNTER — Encounter (HOSPITAL_COMMUNITY): Payer: Self-pay | Admitting: Emergency Medicine

## 2020-03-20 DIAGNOSIS — I10 Essential (primary) hypertension: Secondary | ICD-10-CM | POA: Diagnosis not present

## 2020-03-20 DIAGNOSIS — Z20822 Contact with and (suspected) exposure to covid-19: Secondary | ICD-10-CM | POA: Insufficient documentation

## 2020-03-20 DIAGNOSIS — J90 Pleural effusion, not elsewhere classified: Secondary | ICD-10-CM | POA: Diagnosis not present

## 2020-03-20 DIAGNOSIS — Z79899 Other long term (current) drug therapy: Secondary | ICD-10-CM | POA: Diagnosis not present

## 2020-03-20 DIAGNOSIS — R042 Hemoptysis: Secondary | ICD-10-CM | POA: Diagnosis not present

## 2020-03-20 DIAGNOSIS — J189 Pneumonia, unspecified organism: Secondary | ICD-10-CM | POA: Insufficient documentation

## 2020-03-20 DIAGNOSIS — E039 Hypothyroidism, unspecified: Secondary | ICD-10-CM | POA: Diagnosis not present

## 2020-03-20 DIAGNOSIS — R0781 Pleurodynia: Secondary | ICD-10-CM | POA: Diagnosis not present

## 2020-03-20 DIAGNOSIS — J45909 Unspecified asthma, uncomplicated: Secondary | ICD-10-CM | POA: Diagnosis not present

## 2020-03-20 DIAGNOSIS — R030 Elevated blood-pressure reading, without diagnosis of hypertension: Secondary | ICD-10-CM | POA: Diagnosis not present

## 2020-03-20 DIAGNOSIS — I517 Cardiomegaly: Secondary | ICD-10-CM | POA: Diagnosis not present

## 2020-03-20 DIAGNOSIS — E876 Hypokalemia: Secondary | ICD-10-CM | POA: Diagnosis not present

## 2020-03-20 DIAGNOSIS — Z7901 Long term (current) use of anticoagulants: Secondary | ICD-10-CM | POA: Diagnosis not present

## 2020-03-20 NOTE — ED Triage Notes (Addendum)
Patient presents with hemoptysis and rib cage pain on the left side. Patient reports a similar pain when she had clots in both her lungs. She is concerned this could be happening again.

## 2020-03-21 ENCOUNTER — Emergency Department (HOSPITAL_COMMUNITY)
Admission: EM | Admit: 2020-03-21 | Discharge: 2020-03-21 | Disposition: A | Discharge status: Home / Self Care | Payer: Medicaid Other | Attending: Emergency Medicine | Admitting: Emergency Medicine

## 2020-03-21 ENCOUNTER — Emergency Department (HOSPITAL_COMMUNITY): Payer: Medicaid Other

## 2020-03-21 DIAGNOSIS — I517 Cardiomegaly: Secondary | ICD-10-CM | POA: Diagnosis not present

## 2020-03-21 DIAGNOSIS — E876 Hypokalemia: Secondary | ICD-10-CM

## 2020-03-21 DIAGNOSIS — R042 Hemoptysis: Secondary | ICD-10-CM

## 2020-03-21 DIAGNOSIS — Z7901 Long term (current) use of anticoagulants: Secondary | ICD-10-CM

## 2020-03-21 DIAGNOSIS — J189 Pneumonia, unspecified organism: Secondary | ICD-10-CM

## 2020-03-21 DIAGNOSIS — I10 Essential (primary) hypertension: Secondary | ICD-10-CM

## 2020-03-21 LAB — COMPREHENSIVE METABOLIC PANEL
ALT: 14 U/L (ref 0–44)
AST: 26 U/L (ref 15–41)
Albumin: 3.7 g/dL (ref 3.5–5.0)
Alkaline Phosphatase: 65 U/L (ref 38–126)
Anion gap: 11 (ref 5–15)
BUN: 7 mg/dL (ref 6–20)
CO2: 27 mmol/L (ref 22–32)
Calcium: 8.7 mg/dL — ABNORMAL LOW (ref 8.9–10.3)
Chloride: 101 mmol/L (ref 98–111)
Creatinine, Ser: 1.12 mg/dL — ABNORMAL HIGH (ref 0.44–1.00)
GFR, Estimated: 59 mL/min — ABNORMAL LOW (ref >60)
Glucose, Bld: 93 mg/dL (ref 70–99)
Potassium: 3.2 mmol/L — ABNORMAL LOW (ref 3.5–5.1)
Sodium: 139 mmol/L (ref 135–145)
Total Bilirubin: 0.5 mg/dL (ref 0.3–1.2)
Total Protein: 8.1 g/dL (ref 6.5–8.1)

## 2020-03-21 LAB — CBC WITH DIFFERENTIAL/PLATELET
Abs Immature Granulocytes: 0.01 10*3/uL (ref 0.00–0.07)
Basophils Absolute: 0 10*3/uL (ref 0.0–0.1)
Basophils Relative: 0 %
Eosinophils Absolute: 0 10*3/uL (ref 0.0–0.5)
Eosinophils Relative: 1 %
HCT: 40.2 % (ref 36.0–46.0)
Hemoglobin: 12.3 g/dL (ref 12.0–15.0)
Immature Granulocytes: 0 %
Lymphocytes Relative: 37 %
Lymphs Abs: 1.6 10*3/uL (ref 0.7–4.0)
MCH: 26.7 pg (ref 26.0–34.0)
MCHC: 30.6 g/dL (ref 30.0–36.0)
MCV: 87.2 fL (ref 80.0–100.0)
Monocytes Absolute: 0.5 10*3/uL (ref 0.1–1.0)
Monocytes Relative: 11 %
Neutro Abs: 2.2 10*3/uL (ref 1.7–7.7)
Neutrophils Relative %: 51 %
Platelets: 274 10*3/uL (ref 150–400)
RBC: 4.61 MIL/uL (ref 3.87–5.11)
RDW: 14.6 % (ref 11.5–15.5)
WBC: 4.3 10*3/uL (ref 4.0–10.5)
nRBC: 0 % (ref 0.0–0.2)

## 2020-03-21 LAB — PROTIME-INR
INR: 2.9 — ABNORMAL HIGH (ref 0.8–1.2)
Prothrombin Time: 29.6 seconds — ABNORMAL HIGH (ref 11.4–15.2)

## 2020-03-21 LAB — D-DIMER, QUANTITATIVE: D-Dimer, Quant: 0.3 ug/mL-FEU (ref 0.00–0.50)

## 2020-03-21 LAB — RESPIRATORY PANEL BY RT PCR (FLU A&B, COVID)
Influenza A by PCR: NEGATIVE
Influenza B by PCR: NEGATIVE
SARS Coronavirus 2 by RT PCR: NEGATIVE

## 2020-03-21 MED ORDER — SODIUM CHLORIDE (PF) 0.9 % IJ SOLN
INTRAMUSCULAR | Status: AC
  Filled 2020-03-21: qty 50

## 2020-03-21 MED ORDER — IOHEXOL 350 MG/ML SOLN
100.0000 mL | Freq: Once | INTRAVENOUS | Status: AC | PRN
  Administered 2020-03-21: 100 mL via INTRAVENOUS

## 2020-03-21 MED ORDER — AMOXICILLIN 500 MG PO CAPS: 1000.0000 mg | ORAL_CAPSULE | Freq: Two times a day (BID) | ORAL | 0 refills | Status: DC

## 2020-03-21 MED ORDER — LABETALOL HCL 200 MG PO TABS
200.0000 mg | ORAL_TABLET | Freq: Once | ORAL | Status: AC
  Administered 2020-03-21: 200 mg via ORAL
  Filled 2020-03-21: qty 1

## 2020-03-21 MED ORDER — POTASSIUM CHLORIDE 20 MEQ PO PACK
20.0000 meq | PACK | Freq: Two times a day (BID) | ORAL | 0 refills | Status: DC
Start: 2020-03-21 — End: 2020-07-07

## 2020-03-21 MED ORDER — HYDROCHLOROTHIAZIDE 12.5 MG PO TABS: 12.5000 mg | ORAL_TABLET | Freq: Every day | ORAL | 0 refills | Status: DC

## 2020-03-21 NOTE — ED Provider Notes (Addendum)
[name] Note   CSN: [id number] Arrival date & time: 03/20/20  1826   History Chief Complaint  Patient presents with  . Hemoptysis  . rib cage pain    Angela [name] is a 44 y.o. female.  The history is provided by the patient.  She has history of hypertension, morbid obesity, pulmonary embolism anticoagulated on warfarin and comes in because of left anterior chest pain and hemoptysis.  She states that she has been having pain in the left anterior lower rib cage for about the last 3 days.  Pain is only present if she coughs or laughs.  She denies any trauma but states that she did trip and almost fall prior to onset of this pain.  When present, pain is 10/10.  She is pain-free at rest.  Her sputum has had dark red to brownish material but no bright red blood.  She denies dyspnea.  She denies fever or chills.  She has been compliant with her warfarin.  Past Medical History:  Diagnosis Date  . Allergies   . Asthma   . Back pain   . Depression    hx, doing ok now  . Edema, lower extremity   . GERD (gastroesophageal reflux disease)   . Headache(784.0)   . Heart murmur   . Hypertension    on meds  . IBS (irritable bowel syndrome)   . Joint pain   . Neuropathy   . Obesity   . Osteoarthritis   . Ovarian cyst   . Prediabetes   . Preterm labor   . Vitamin D deficiency     Patient Active Problem List   Diagnosis Date Noted  . AKI (acute kidney injury) ([place]) 12/31/2019  . Asthma 12/31/2019  . Hypothyroidism 12/31/2019  . Acute pulmonary embolism ([place]) 12/30/2019  . Essential hypertension 07/08/2018  . Morbid obesity ([place]) 07/08/2018  . Neuropathy of both feet 07/08/2018  . Primary osteoarthritis of both knees 04/02/2018  . Foot pain, right 04/02/2018  . 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 HTN (hypertension), benign 12/17/2016  . Pain, pelvic, female 02/05/2012  . Fibroids 02/05/2012  . Abnormal uterine bleeding  02/05/2012    Past Surgical History:  Procedure Laterality Date  . stye removal   1994  . TUBAL LIGATION  1998     OB History    Gravida  7   Para  5   Term  3   Preterm  2   AB  2   Living  5     SAB  2   TAB  0   Ectopic  0   Multiple  0   Live Births  5           Family History  Problem Relation Age of Onset  . Hyperlipidemia Mother   . Hypertension Mother   . Thyroid disease Mother   . Obesity Mother   . Clotting disorder Mother   . Cancer Son   . Hypertension Other   . Diabetes Other   . Other Neg Hx     Social History   Tobacco Use  . Smoking status: Never Smoker  . Smokeless tobacco: Never Used  Vaping Use  . Vaping Use: Never used  Substance Use Topics  . Alcohol use: No  . Drug use: No    Home Medications Prior to Admission medications   Medication Sig Start Date End Date Taking? Authorizing Provider  albuterol (PROVENTIL) (2.5 MG/3ML) 0.083% nebulizer  solution Take 3 mLs (2.5 mg total) by nebulization every 6 (six) hours as needed for wheezing or shortness of breath. 01/06/20  Yes Shahmehdi, Seyed A, MD  amLODipine (NORVASC) 10 MG tablet Take 1 tablet (10 mg total) by mouth daily. Patient taking differently: Take 10 mg by mouth daily.  01/06/20 03/21/20 Yes Shahmehdi, Seyed A, MD  hydrochlorothiazide (HYDRODIURIL) 12.5 MG tablet Take 1 tablet (12.5 mg total) by mouth daily. 01/06/20 03/21/20 Yes Shahmehdi, Seyed A, MD  labetalol (NORMODYNE) 200 MG tablet Take 1 tablet (200 mg total) by mouth 2 (two) times daily. 01/06/20 03/21/20 Yes Shahmehdi, Valeria Batman, MD  levothyroxine (SYNTHROID) 25 MCG tablet Take 0.5 tablets (12.5 mcg total) by mouth daily at 6 (six) AM. 01/07/20 03/21/20 Yes Shahmehdi, Seyed A, MD  montelukast (SINGULAIR) 10 MG tablet Take 1 tablet (10 mg total) by mouth at bedtime. 12/28/19  Yes Jaynee Eagles, PA-C  potassium chloride (KLOR-CON) 20 MEQ packet Take 40 mEq by mouth once for 1 dose. 01/06/20 03/21/20 Yes Shahmehdi, Valeria Batman, MD    Vitamin D, Ergocalciferol, (DRISDOL) 1.25 MG (50000 UNIT) CAPS capsule Take one tablet wkly 02/11/20  Yes Fulp, Cammie, MD  warfarin (COUMADIN) 5 MG tablet Take 5 mg by mouth daily. 01/06/20  Yes [provider]  albuterol (VENTOLIN HFA) 108 (90 Base) MCG/ACT inhaler Inhale 1-2 puffs into the lungs every 6 (six) hours as needed for wheezing or shortness of breath. Patient not taking: Reported on 03/21/2020 12/02/19   Ladell Pier, MD  gabapentin (NEURONTIN) 300 MG capsule Take 1 capsule (300 mg total) by mouth 2 (two) times daily. Patient not taking: Reported on 02/03/2020 01/06/20 02/05/20  Deatra James, MD  rosuvastatin (CRESTOR) 5 MG tablet Take 0.5 tablets (2.5 mg total) by mouth at bedtime. Patient not taking: Reported on 02/03/2020 12/09/19   Mellody Dance, DO    Allergies    Doxycycline and Flagyl [metronidazole hcl]  Review of Systems   Review of Systems  All other systems reviewed and are negative.   Physical Exam Updated Vital Signs BP (!) 145/96   Pulse 95   Temp 99.6 F (37.6 C) (Oral)   Resp (!) 21   Ht 5\' 1"  (1.549 m)   SpO2 93%   BMI 67.45 kg/m   Physical Exam Vitals and nursing note reviewed.   Morbidly obese 44 year old female, resting comfortably and in no acute distress. Vital signs are significant for elevated respiratory rate and blood pressure. Oxygen saturation is 93%, which is normal. Head is normocephalic and atraumatic. PERRLA, EOMI. Oropharynx is clear. Neck is nontender and supple without adenopathy or JVD. Back is nontender and there is no CVA tenderness. Lungs are clear without rales, wheezes, or rhonchi. Chest is nontender. Heart has regular rate and rhythm without murmur. Abdomen is soft, flat, nontender without masses or hepatosplenomegaly and peristalsis is normoactive. Extremities have no cyanosis or edema, full range of motion is present. Skin is warm and dry without rash. Neurologic: Mental status is normal, cranial nerves are  intact, there are no motor or sensory deficits.  ED Results / Procedures / Treatments   Labs (all labs ordered are listed, but only abnormal results are displayed) Labs Reviewed  CBC WITH DIFFERENTIAL/PLATELET  COMPREHENSIVE METABOLIC PANEL  D-DIMER, QUANTITATIVE (NOT AT [facility])    EKG EKG Interpretation  Date/Time:  Tuesday March 21 2020 01:31:12 EDT Ventricular Rate:  96 PR Interval:    QRS Duration: 94 QT Interval:  359 QTC Calculation: 454 R  Axis:   23 Text Interpretation: Sinus rhythm Probable left atrial enlargement Low voltage, precordial leads Consider anterior infarct Borderline repolarization abnormality When compared with ECG of 01/01/2020, Nonspecific T wave abnormality is now present Confirmed by Delora Fuel ([id number]) on 03/21/2020 1:53:34 AM   Radiology DG Chest 2 View  Result Date: 03/20/2020 CLINICAL DATA:  44 year old female with shortness of breath, hemoptysis, left side rib pain. History of pulmonary emboli in July. EXAM: CHEST - 2 VIEW COMPARISON:  CTA chest 12/30/2019 and earlier. FINDINGS: Stable cardiac size at the upper limits of normal. Other mediastinal contours are within normal limits. Visualized tracheal air column is within normal limits. Widespread streaky and coarse bilateral pulmonary opacity is new since July. No superimposed pneumothorax. No definite pleural effusion. No acute osseous abnormality identified. Negative visible bowel gas pattern. IMPRESSION: Widespread coarse and streaky pulmonary opacity in both lung is new since July. Although nonspecific this most resembles acute viral/atypical respiratory infection. No definite pleural effusion. Electronically Signed   By: Genevie Ann M.D.   On: 03/20/2020 19:39    Procedures Procedures   Medications Ordered in ED Medications  labetalol (NORMODYNE) tablet 200 mg (has no administration in time range)    ED Course  I have reviewed the triage vital signs and the nursing notes.  Pertinent labs &  imaging results that were available during my care of the patient were reviewed by me and considered in my medical decision making (see chart for details).  MDM Rules/Calculators/A&P Chest pain and hemoptysis and patient anticoagulated on warfarin.  Old records reviewed showing hospitalization for unprovoked pulmonary embolism on 7/31.  Last INR was on 10/5 and was therapeutic at 3.0.  Doubt new pulmonary embolism given adequate anticoagulation and relatively normal vital signs but will check CT angiogram to be sure.  Also, will need to recheck INR to make sure it is not supratherapeutic.  ECG shows minor nonspecific T wave abnormality.  Chest x-ray shows changes possibly secondary to bronchitis.  CBC is normal.  CT scan shows no evidence of pulmonary emboli, possible bilateral pneumonia consistent with viral process. No other symptoms to suggest COVID-19, but will send swab for PCR test. INR has come back therapeutic. Potassium has come back slightly low and she is given a dose of oral potassium. She is discharged with prescription for amoxicillin and K. Dur and referred back to her primary care provider for follow-up.  Trenita Hulme Whitlatch was evaluated in Emergency Department on 03/21/2020 for the symptoms described in the history of present illness. She was evaluated in the context of the global COVID-19 pandemic, which necessitated consideration that the patient might be at risk for infection with the SARS-CoV-2 virus that causes COVID-19. Institutional protocols and algorithms that pertain to the evaluation of patients at risk for COVID-19 are in a state of rapid change based on information released by regulatory bodies including the CDC and federal and state organizations. These policies and algorithms were followed during the patient's care in the ED.  Final Clinical Impression(s) / ED Diagnoses Final diagnoses:  Community acquired pneumonia, unspecified laterality  Hemoptysis  Elevated blood  pressure reading with diagnosis of hypertension  Anticoagulated on warfarin  Hypokalemia    Rx / DC Orders ED Discharge Orders         Ordered    potassium chloride (KLOR-CON) 20 MEQ packet  2 times daily        03/21/20 0509    amoxicillin (AMOXIL) 500 MG capsule  2 times daily  03/21/20 7207           Delora Fuel, MD 21/82/88 [id number]    Delora Fuel, MD 45/14/60 [phone]

## 2020-03-21 NOTE — Discharge Instructions (Addendum)
Return if symptoms are getting worse. °

## 2020-03-26 NOTE — Progress Notes (Addendum)
Cardiology Office Note:    Date:  03/28/2020   ID:  Angela, [name] 1975/11/06, MRN [id number]  PCP:  Ladell Pier, MD  [facility] Cardiologist:  Freada Bergeron, MD  [facility] Electrophysiologist:  None   Referring MD: Antony Blackbird, MD     History of Present Illness:    Angela [name] is a 44 y.o. female with a hx of essential HTN, morbid obesity, recent unprovoked bilateral pulmonary emboli in 12/2019, asthma, and hypothyroidism who was referred by Dr. Wynetta Emery for further management of her PE.  Recent ED visit on 03/21/20 reviewed. Patient presented with severe left sided chest pain and hemoptysis. CTA chest showed no evidence of recurrent PE but possible bilateral pneumonia consistent with viral pneumonia. INR therapeutic at that time at 2.9. COVID negative. Discharged home on amoxicillin with plans for follow-up with PCP.  Patient states she has been having a very difficult time as she lost her mother to a stroke in September. She has been suffering from significant grief. Has been taking her medications but not always at the same time or the way she "is supposed to." No SOB, chest pain, orthopnea, LE edema, palpitations, fevers or chills. No further hemoptysis. On ABX for pneumonia and is feeling much better. No other bleeding (melena, hematuria, hematochezia).   Blood pressures at home 120/80s. Elevated today as just took amlodipine.   Mother: Strokes x2; ? Clotting disorder, father unknown, older sister deceased from lung cancer. Grandmother with CAD.   INR has been well controlled; taking warfarin 5mg  daily. No known clotting disorders or hypercoaguable state. Not on HRT. No tobacco.  Past Medical History:  Diagnosis Date  . Allergies   . Asthma   . Back pain   . Depression    hx, doing ok now  . Edema, lower extremity   . GERD (gastroesophageal reflux disease)   . Headache(784.0)   . Heart murmur   . Hypertension    on meds  . IBS  (irritable bowel syndrome)   . Joint pain   . Neuropathy   . Obesity   . Osteoarthritis   . Ovarian cyst   . Prediabetes   . Preterm labor   . Vitamin D deficiency     Past Surgical History:  Procedure Laterality Date  . stye removal   1994  . TUBAL LIGATION  1998    Current Medications: Current Meds  Medication Sig  . albuterol (PROVENTIL) (2.5 MG/3ML) 0.083% nebulizer solution Take 3 mLs (2.5 mg total) by nebulization every 6 (six) hours as needed for wheezing or shortness of breath.  Marland Kitchen albuterol (VENTOLIN HFA) 108 (90 Base) MCG/ACT inhaler Inhale 1-2 puffs into the lungs every 6 (six) hours as needed for wheezing or shortness of breath.  Marland Kitchen amoxicillin (AMOXIL) 500 MG capsule Take 2 capsules (1,000 mg total) by mouth 2 (two) times daily.  . hydrochlorothiazide (HYDRODIURIL) 12.5 MG tablet Take 1 tablet (12.5 mg total) by mouth daily.  Marland Kitchen labetalol (NORMODYNE) 200 MG tablet Take 1 tablet (200 mg total) by mouth 2 (two) times daily.  Marland Kitchen levothyroxine (SYNTHROID) 25 MCG tablet Take 0.5 tablets (12.5 mcg total) by mouth daily at 6 (six) AM.  . montelukast (SINGULAIR) 10 MG tablet Take 1 tablet (10 mg total) by mouth at bedtime.  . potassium chloride (KLOR-CON) 20 MEQ packet Take 20 mEq by mouth 2 (two) times daily.  . Vitamin D, Ergocalciferol, (DRISDOL) 1.25 MG (50000 UNIT) CAPS capsule Take one tablet wkly  .  warfarin (COUMADIN) 5 MG tablet Take 5 mg by mouth daily.  . [DISCONTINUED] amLODipine (NORVASC) 10 MG tablet Take 1 tablet (10 mg total) by mouth daily.     Allergies:   Doxycycline and Flagyl [metronidazole hcl]   Social History   Socioeconomic History  . Marital status: Single    Spouse name: Not on file  . Number of children: Not on file  . Years of education: Not on file  . Highest education level: Not on file  Occupational History  . Occupation: Surveyor, quantity  Tobacco Use  . Smoking status: Never Smoker  . Smokeless tobacco: Never Used  Vaping Use  .  Vaping Use: Never used  Substance and Sexual Activity  . Alcohol use: No  . Drug use: No  . Sexual activity: Yes    Birth control/protection: Condom, Surgical  Other Topics Concern  . Not on file  Social History Narrative  . Not on file   Social Determinants of Health   Financial Resource Strain:   . Difficulty of Paying Living Expenses: Not on file  Food Insecurity:   . Worried About Charity fundraiser in the Last Year: Not on file  . Ran Out of Food in the Last Year: Not on file  Transportation Needs:   . Lack of Transportation (Medical): Not on file  . Lack of Transportation (Non-Medical): Not on file  Physical Activity:   . Days of Exercise per Week: Not on file  . Minutes of Exercise per Session: Not on file  Stress:   . Feeling of Stress : Not on file  Social Connections:   . Frequency of Communication with Friends and Family: Not on file  . Frequency of Social Gatherings with Friends and Family: Not on file  . Attends Religious Services: Not on file  . Active Member of Clubs or Organizations: Not on file  . Attends Archivist Meetings: Not on file  . Marital Status: Not on file     Family History: The patient's family history includes Cancer in her son; Clotting disorder in her mother; Diabetes in an other family member; Hyperlipidemia in her mother; Hypertension in her mother and another family member; Obesity in her mother; Thyroid disease in her mother. There is no history of Other.  ROS:   Please see the history of present illness.    Review of Systems  Constitutional: Positive for malaise/fatigue. Negative for chills and fever.  HENT: Negative for sore throat.   Eyes: Negative for blurred vision.  Respiratory: Positive for shortness of breath. Negative for cough.   Cardiovascular: Negative for chest pain, palpitations, orthopnea, claudication, leg swelling and PND.  Gastrointestinal: Negative for abdominal pain, blood in stool, melena, nausea and  vomiting.  Genitourinary: Negative for hematuria.  Skin: Negative for rash.  Neurological: Negative for dizziness and loss of consciousness.  Endo/Heme/Allergies: Negative for polydipsia.  Psychiatric/Behavioral: Positive for depression.    EKGs/Labs/Other Studies Reviewed:    The following studies were reviewed today: 12/31/19: Study Result    ECHOCARDIOGRAM REPORT       Patient Name:  AMITY ROES Date of Exam: 12/31/2019  Medical Rec #: [id number]    Height:    61.0 in  Accession #:  [id number]    Weight:    371.0 lb  Date of Birth: [birth year]    BSA:     2.456 m  Patient Age:  41 years     BP:      146/113 mmHg  Patient Gender: F        HR:      90 bpm.  Exam Location: Inpatient   Procedure: 2D Echo   Indications:  Pulmonary Embolism    History:    Patient has no prior history of Echocardiogram  examinations.         Signs/Symptoms:Murmur; Risk Factors:Hypertension.    Sonographer:  Mikki Santee RDCS (AE)  Referring Phys: [id number] [place]    1. Left ventricular ejection fraction, by estimation, is 60 to 65%. The  left ventricle has normal function. The left ventricle has no regional  wall motion abnormalities. There is moderate left ventricular hypertrophy  and severe basal hypertrophy.  2. Right ventricular systolic function is normal. The right ventricular  size is normal. There is severely elevated pulmonary artery systolic  pressure. The estimated right ventricular systolic pressure is 01.0 mmHg.  3. The mitral valve is normal in structure. No evidence of mitral valve  regurgitation. No evidence of mitral stenosis.  4. Tricuspid valve regurgitation is mild to moderate.  5. The aortic valve is normal in structure. Aortic valve regurgitation is  not visualized. No aortic stenosis is present.  6. The inferior vena cava is dilated in size with >50%  respiratory  variability, suggesting right atrial pressure of 8 mmHg.    12/31/19 LE doppler ultrasound: Summary:  RIGHT:  - There is no evidence of deep vein thrombosis in the lower extremity.  However, portions of this examination were limited- see technologist  comments above.    LEFT:  - There is no evidence of deep vein thrombosis in the lower extremity.  However, portions of this examination were limited- see technologist  comments above.   Bilateral ABI 07/2018: Summary:  Right: Resting right ankle-brachial index is within normal range. No  evidence of significant right lower extremity arterial disease. The right  toe-brachial index is normal. RT great toe pressure = 185 mmHg.   Left: Resting left ankle-brachial index indicates noncompressible left  lower extremity arteries.The left toe-brachial index is normal. LT Great  toe pressure = 176 mmHg.   CTA 03/2020: FINDINGS: Cardiovascular: Mild cardiomegaly. No pericardial effusion. The thoracic aorta is unremarkable. Evaluation of the pulmonary arteries is limited due to respiratory motion artifact and suboptimal opacification and timing of the contrast. No large central pulmonary artery embolus identified. V/Q scan may provide additional information if there is high clinical concern for acute PE.  Mediastinum/Nodes: There is no hilar or mediastinal adenopathy. The esophagus and the thyroid gland are grossly unremarkable. No mediastinal fluid collection.  Lungs/Pleura: Bilateral streaky and confluent airspace opacities most consistent with multifocal pneumonia, likely viral or atypical in etiology including COVID-19. Clinical correlation is recommended. No lobar consolidation, pleural effusion, pneumothorax. The central airways are patent.  Upper Abdomen: No acute abnormality.  Musculoskeletal: No chest wall abnormality. No acute or significant osseous findings.  Review of the MIP images confirms the above  findings.  IMPRESSION: 1. No CT evidence of central pulmonary artery embolus. 2. Multifocal pneumonia, likely viral or atypical in etiology. Clinical correlation is recommended. 3. Mild cardiomegaly.  EKG:  EKG 03/21/20: NSR with poor r-wave progression. No ischemia. No block.  Recent Labs: 12/31/2019: B Natriuretic Peptide 1,164.3 02/03/2020: TSH 3.290 03/21/2020: ALT 14; BUN 7; Creatinine, Ser 1.12; Hemoglobin 12.3; Platelets 274; Potassium 3.2; Sodium 139  Recent Lipid Panel    Component Value Date/Time   CHOL 180 11/25/2019 1254   TRIG 74 11/25/2019 1254   HDL  51 11/25/2019 1254   CHOLHDL 3.5 11/25/2019 1254   LDLCALC 115 (H) 11/25/2019 1254      Physical Exam:    VS:  BP (!) 140/100   Pulse 85   Ht 5\' 1"  (1.549 m)   Wt (!) 346 lb 6.4 oz (157.1 kg)   SpO2 95%   BMI 65.45 kg/m     Wt Readings from Last 3 Encounters:  03/28/20 (!) 346 lb 6.4 oz (157.1 kg)  02/03/20 (!) 357 lb (161.9 kg)  12/31/19 (!) 371 lb 0.6 oz (168.3 kg)     GEN: Comfortable, tearful on exam HEENT: Normal NECK: No JVD; No carotid bruits CARDIAC: RRR, no murmurs, rubs, gallops RESPIRATORY:  Clear to auscultation without rales, wheezing or rhonchi  ABDOMEN: Soft, non-tender, non-distended MUSCULOSKELETAL:  No edema; No deformity  SKIN: Warm and dry NEUROLOGIC:  Alert and oriented x 3 PSYCHIATRIC:  Tearful due to loss of her mother  ASSESSMENT:    1. HTN (hypertension), benign   2. Grief    PLAN:    In order of problems listed above:  #Unprovoked Pulmonary Embolism: Diagnosed on 12/30/19 when she presented to the ED with worsening SOB. No provoking factors (no recent long car/plane ride, period of immobility, surgery, illness, OCP/HRT use etc). CTA showed "Emboli involve both main pulmonary arteries extending into all lobar and many segmental and subsegmental branches. Straightening of the intraventricular septum with RV to LV ratio of 2.1. Minimal contrast refluxes into the hepatic  veins and IVC." She was treated with IV heparin later transitioned to warfarin upon discharge.  -Continue warfarin 5mg  daily -INR followed by PCP; has been therapeutic -May consider checking hypercoaguable panel at 48months given history of clotting disorder in mother; will refer to heme for further management -Anticipate at least 59month course of warfarin-->heme referral as above -No DOAC for now due to morbid obesity with BMI 65 -Discussed how she cannot take HRT in the future; no tobacco use  #HTN: Well controlled at home with BP 120s/80s. -Continue amlodipine 10mg  daily and HCTZ 12.5mg  daily  #HLD: -Resume crestor 5mg  daily  #Morbid Obesity BMI 65: Discussed that weight loss will significantly decrease her CV risk. She was previously followed at weight loss clinic but has not followed up since the loss of her mother in September, but is amenable to going again. -Follow-up in weight loss clinic  #Hypothyroidism: -Continue synthroid 61mcg  Medication Adjustments/Labs and Tests Ordered: Current medicines are reviewed at length with the patient today.  Concerns regarding medicines are outlined above.  Orders Placed This Encounter  Procedures  . ALT  . Lipid panel  . Ambulatory referral to Psychiatry   Meds ordered this encounter  Medications  . amLODipine (NORVASC) 10 MG tablet    Sig: Take 1 tablet (10 mg total) by mouth daily.    Dispense:  30 tablet    Refill:  2  . rosuvastatin (CRESTOR) 5 MG tablet    Sig: Take 1 tablet (5 mg total) by mouth daily.    Dispense:  90 tablet    Refill:  3    Patient Instructions  Medication Instructions:  Your physician has recommended you make the following change in your medication:  1) START taking Crestor (rosuvastatin) 5mg  daily  *If you need a refill on your cardiac medications before your next appointment, please call your pharmacy*   Lab Work: Fasting lipids and ALT in 6 weeks  If you have labs (blood work) drawn today and  your tests  are completely normal, you will receive your results only by: Marland Kitchen MyChart Message (if you have MyChart) OR . A paper copy in the mail If you have any lab test that is abnormal or we need to change your treatment, we will call you to review the results.  Follow-Up: At [facility], you and your health needs are our priority.  As part of our continuing mission to provide you with exceptional heart care, we have created designated Provider Care Teams.  These Care Teams include your primary Cardiologist (physician) and Advanced Practice Providers (APPs -  Physician Assistants and Nurse Practitioners) who all work together to provide you with the care you need, when you need it.  Your next appointment:   4 month(s)  The format for your next appointment:   In Person  Provider:   You may see Freada Bergeron, MD or one of the following Advanced Practice Providers on your designated Care Team:    Richardson Dopp, PA-C  Robbie Lis, [place]      Signed, Freada Bergeron, MD  03/28/2020 2:10 PM    [place]

## 2020-03-28 ENCOUNTER — Ambulatory Visit (INDEPENDENT_AMBULATORY_CARE_PROVIDER_SITE_OTHER): Payer: Medicaid Other | Admitting: Cardiology

## 2020-03-28 ENCOUNTER — Encounter: Payer: Self-pay | Admitting: Cardiology

## 2020-03-28 ENCOUNTER — Other Ambulatory Visit: Payer: Self-pay

## 2020-03-28 VITALS — BP 140/100 | HR 85 | Ht 61.0 in | Wt 346.4 lb

## 2020-03-28 DIAGNOSIS — I10 Essential (primary) hypertension: Secondary | ICD-10-CM | POA: Diagnosis not present

## 2020-03-28 DIAGNOSIS — F4321 Adjustment disorder with depressed mood: Secondary | ICD-10-CM

## 2020-03-28 MED ORDER — ROSUVASTATIN CALCIUM 5 MG PO TABS: 5.0000 mg | ORAL_TABLET | Freq: Every day | ORAL | 3 refills | Status: DC

## 2020-03-28 MED ORDER — AMLODIPINE BESYLATE 10 MG PO TABS: 10.0000 mg | ORAL_TABLET | Freq: Every day | ORAL | 2 refills | Status: DC

## 2020-03-28 NOTE — Patient Instructions (Signed)
Medication Instructions:  Your physician has recommended you make the following change in your medication:  1) START taking Crestor (rosuvastatin) 5mg  daily  *If you need a refill on your cardiac medications before your next appointment, please call your pharmacy*   Lab Work: Fasting lipids and ALT in 6 weeks  If you have labs (blood work) drawn today and your tests are completely normal, you will receive your results only by: Marland Kitchen MyChart Message (if you have MyChart) OR . A paper copy in the mail If you have any lab test that is abnormal or we need to change your treatment, we will call you to review the results.  Follow-Up: At [facility], you and your health needs are our priority.  As part of our continuing mission to provide you with exceptional heart care, we have created designated Provider Care Teams.  These Care Teams include your primary Cardiologist (physician) and Advanced Practice Providers (APPs -  Physician Assistants and Nurse Practitioners) who all work together to provide you with the care you need, when you need it.  Your next appointment:   4 month(s)  The format for your next appointment:   In Person  Provider:   You may see Freada Bergeron, MD or one of the following Advanced Practice Providers on your designated Care Team:    Richardson Dopp, PA-C  [place], [place]

## 2020-04-03 DIAGNOSIS — Z419 Encounter for procedure for purposes other than remedying health state, unspecified: Secondary | ICD-10-CM | POA: Diagnosis not present

## 2020-04-07 ENCOUNTER — Other Ambulatory Visit: Payer: Self-pay

## 2020-04-07 ENCOUNTER — Ambulatory Visit: Payer: Medicaid Other | Attending: Internal Medicine | Admitting: Pharmacist

## 2020-04-07 DIAGNOSIS — I2699 Other pulmonary embolism without acute cor pulmonale: Secondary | ICD-10-CM

## 2020-04-07 LAB — POCT INR: INR: 2.3 (ref 2.0–3.0)

## 2020-04-14 ENCOUNTER — Encounter: Payer: Self-pay | Admitting: Internal Medicine

## 2020-04-17 ENCOUNTER — Other Ambulatory Visit: Payer: Self-pay | Admitting: Internal Medicine

## 2020-04-17 NOTE — Telephone Encounter (Signed)
Requested medication (s) are due for refill today:Labetalol yes   Hydrochlorothiazide due 04/21/2020  Requested medication (s) are on the active medication list: yes  Last refill: Labetalol  01/06/20  #60  2 refills   Hydro chlorothiazide 03/21/20  # 30  0 refills  Future visit scheduled no  Notes to clinic: Historical providers both meds  Requested Prescriptions  Pending Prescriptions Disp Refills   labetalol (NORMODYNE) 200 MG tablet [Pharmacy Med Name: LABETALOL 200MG  TABLETS] 60 tablet 2    Sig: TAKE 1 TABLET(200 MG) BY MOUTH TWICE DAILY      Cardiovascular:  Beta Blockers Failed - 04/17/2020  5:21 PM      Failed - Last BP in normal range    BP Readings from Last 1 Encounters:  03/28/20 (!) 140/100          Passed - Last Heart Rate in normal range    Pulse Readings from Last 1 Encounters:  03/28/20 85          Passed - Valid encounter within last 6 months    Recent Outpatient Visits           2 months ago Bilateral pulmonary embolism ([place])   [facility] Fulp, [place], MD   4 months ago Mild intermittent asthma with exacerbation   [facility], Neoma L[place] B, MD   1 year ago Pain in both [place], Deborah B, MD   1 year ago Primary osteoarthritis of both [place], [place], [place]   1 year ago Essential hypertension   [place], MD       Future Appointments             In 3 months Johney Frame, Greer Ee, MD [place], [facility]              hydrochlorothiazide (HYDRODIURIL) 12.5 MG tablet [Pharmacy Med Name: HYDROCHLOROTHIAZIDE 12.5MG  TABLETS] 30 tablet 0    Sig: TAKE 1 TABLET(12.5 MG) BY MOUTH DAILY      Cardiovascular: Diuretics - Thiazide Failed - 04/17/2020  5:21 PM      Failed - Ca in normal range and within 360 days     Calcium  Date Value Ref Range Status  03/21/2020 8.7 (L) 8.9 - 10.3 mg/dL Final   Calcium, Ion  Date Value Ref Range Status  10/02/2014 1.22 1.12 - 1.23 mmol/L Final          Failed - Cr in normal range and within 360 days    Creatinine  Date Value Ref Range Status  12/30/2017 359.6 (H) 20.0 - 300.0 mg/dL Final   Creat  Date Value Ref Range Status  08/06/2016 1.12 (H) 0.50 - 1.10 mg/dL Final   Creatinine, Ser  Date Value Ref Range Status  03/21/2020 1.12 (H) 0.44 - 1.00 mg/dL Final          Failed - K in normal range and within 360 days    Potassium  Date Value Ref Range Status  03/21/2020 3.2 (L) 3.5 - 5.1 mmol/L Final          Failed - Last BP in normal range    BP Readings from Last 1 Encounters:  03/28/20 (!) 140/100          Passed - Na in normal range and within 360 days  Sodium  Date Value Ref Range Status  03/21/2020 139 135 - 145 mmol/L Final  02/03/2020 145 (H) 134 - 144 mmol/L Final          Passed - Valid encounter within last 6 months    Recent Outpatient Visits           2 months ago Bilateral pulmonary embolism ([place])   [facility] Fulp, [place], MD   4 months ago Mild intermittent asthma with exacerbation   [place], MD   1 year ago Pain in both hands   [place], Deborah B, MD   1 year ago Primary osteoarthritis of both knees   [facility] [place], [place], [place]   1 year ago Essential hypertension   [place], MD       Future Appointments             In 3 months Johney Frame, Greer Ee, MD [place], [facility]

## 2020-05-03 DIAGNOSIS — Z419 Encounter for procedure for purposes other than remedying health state, unspecified: Secondary | ICD-10-CM | POA: Diagnosis not present

## 2020-05-08 ENCOUNTER — Telehealth: Payer: Self-pay | Admitting: Internal Medicine

## 2020-05-08 NOTE — Telephone Encounter (Signed)
1) Medication(s) Requested (by name): warfarin (COUMADIN) 5 MG tablet    2) Pharmacy of Choice: [facility] [place], [place] - [address] AT [place]   3) Special Requests:   Approved medications will be sent to the pharmacy, we will reach out if there is an issue.  Requests made after 3pm may not be addressed until the following business day!  If a patient is unsure of the name of the medication(s) please note and ask patient to call back when they are able to provide all info, do not send to responsible party until all information is available!

## 2020-05-09 ENCOUNTER — Other Ambulatory Visit: Payer: Medicaid Other

## 2020-05-09 MED ORDER — WARFARIN SODIUM 5 MG PO TABS
5.0000 mg | ORAL_TABLET | Freq: Every day | ORAL | 2 refills | Status: DC
Start: 2020-05-09 — End: 2020-07-14

## 2020-05-09 NOTE — Telephone Encounter (Signed)
Rx sent 

## 2020-05-12 ENCOUNTER — Ambulatory Visit: Payer: Medicaid Other | Admitting: Pharmacist

## 2020-05-16 ENCOUNTER — Telehealth (INDEPENDENT_AMBULATORY_CARE_PROVIDER_SITE_OTHER): Payer: Medicaid Other | Admitting: Psychiatry

## 2020-05-16 ENCOUNTER — Other Ambulatory Visit: Payer: Self-pay

## 2020-05-16 ENCOUNTER — Encounter (HOSPITAL_COMMUNITY): Payer: Self-pay

## 2020-05-16 DIAGNOSIS — F331 Major depressive disorder, recurrent, moderate: Secondary | ICD-10-CM

## 2020-05-16 HISTORY — DX: Major depressive disorder, recurrent, moderate: F33.1

## 2020-05-16 NOTE — Progress Notes (Signed)
Psychiatric Initial Adult Assessment   Patient Identification: Angela [name] MRN:  [id number] Date of Evaluation:  05/16/2020 Referral Source: self Chief Complaint:   Chief Complaint    Anxiety; Depression     Visit Diagnosis:    ICD-10-CM   1. Major depressive disorder, recurrent episode, moderate (HCC)  F33.1     History of Present Illness:   Patient is a 44 year old female who self refers today with complaints of worsening depressive symptoms following the death of her mother.  Patient states she has been struggling with depression for several years and is triggered primarily by loss in her family.  Patient reports being tearful dysphoric and struggling with insomnia since the time of her mother's passing.  Patient states she feels lost and that she does not have a soul in the world who supports her.  She endorses frequent crying spells and loss of appetite.  She denies being ever treated for depressive symptoms in the past or taking any psychiatric medications.  The patient denies SI HI AH VH or panic attacks.  She denies irritability increased energy or goal-directed behaviors.  She has no history of nicotine or alcohol use as well as illicit drugs.  The patient has a stated history of osteoarthritis, pulmonary blood clots, scoliosis and right knee bone spurs.  The patient states she is on daily Coumadin therapy with monthly lab monitoring.  The patient states she is not always compliant with her medication regimen at this time.  The patient is not open to psychiatric prescription medications at this time.  The patient has a poor social support system and states she will soon have to live in a motel with her 8 grandchildren that she cares for ranging in ages 7-11.  The patient is open to psychotherapy but does not wish to participate in grief counseling or group therapy at this time stating, "too many people give me anxiety."  Clinician referred patient to several community resources to  facilitate safe housing options for the patient, to which the patient was receptive.  Will follow up with patient in 2 months as recommended.  Associated Signs/Symptoms: Depression Symptoms:  depressed mood, fatigue, anxiety, (Hypo) Manic Symptoms:  none Anxiety Symptoms:  Excessive Worry, Psychotic Symptoms:  none PTSD Symptoms: NA  Past Psychiatric History: depression  Previous Psychotropic Medications: No   Substance Abuse History in the last 12 months:  No.  Consequences of Substance Abuse: NA  Past Medical History:  Past Medical History:  Diagnosis Date  . Allergies   . Asthma   . Back pain   . Depression    hx, doing ok now  . Edema, lower extremity   . GERD (gastroesophageal reflux disease)   . Headache(784.0)   . Heart murmur   . Hypertension    on meds  . IBS (irritable bowel syndrome)   . Joint pain   . Major depressive disorder, recurrent episode, moderate ([place]) 05/16/2020  . Neuropathy   . Obesity   . Osteoarthritis   . Ovarian cyst   . Prediabetes   . Preterm labor   . Vitamin D deficiency     Past Surgical History:  Procedure Laterality Date  . stye removal   1994  . TUBAL LIGATION  1998    Family Psychiatric History: none  Family History:  Family History  Problem Relation Age of Onset  . Hyperlipidemia Mother   . Hypertension Mother   . Thyroid disease Mother   . Obesity Mother   .  Clotting disorder Mother   . Cancer Son   . Hypertension Other   . Diabetes Other   . Other Neg Hx     Social History:   Social History   Socioeconomic History  . Marital status: Single    Spouse name: Not on file  . Number of children: Not on file  . Years of education: Not on file  . Highest education level: Not on file  Occupational History  . Occupation: Surveyor, quantity  Tobacco Use  . Smoking status: Never Smoker  . Smokeless tobacco: Never Used  Vaping Use  . Vaping Use: Never used  Substance and Sexual Activity  . Alcohol use:  No  . Drug use: No  . Sexual activity: Yes    Birth control/protection: Condom, Surgical  Other Topics Concern  . Not on file  Social History Narrative  . Not on file   Social Determinants of Health   Financial Resource Strain: Not on file  Food Insecurity: Not on file  Transportation Needs: Not on file  Physical Activity: Not on file  Stress: Not on file  Social Connections: Not on file    Additional Social History: caregiver for 8 grandchildren  Allergies:   Allergies  Allergen Reactions  . Doxycycline Nausea And Vomiting  . Flagyl [Metronidazole Hcl] Hives and Nausea And Vomiting    Metabolic Disorder Labs: Lab Results  Component Value Date   HGBA1C 5.8 (H) 11/25/2019   No results found for: PROLACTIN Lab Results  Component Value Date   CHOL 180 11/25/2019   TRIG 74 11/25/2019   HDL 51 11/25/2019   CHOLHDL 3.5 11/25/2019   LDLCALC 115 (H) 11/25/2019   LDLCALC 120 (H) 04/02/2018   Lab Results  Component Value Date   TSH 3.290 02/03/2020    Therapeutic Level Labs: No results found for: LITHIUM No results found for: CBMZ No results found for: VALPROATE  Current Medications: Current Outpatient Medications  Medication Sig Dispense Refill  . albuterol (PROVENTIL) (2.5 MG/3ML) 0.083% nebulizer solution Take 3 mLs (2.5 mg total) by nebulization every 6 (six) hours as needed for wheezing or shortness of breath. 75 mL 12  . albuterol (VENTOLIN HFA) 108 (90 Base) MCG/ACT inhaler Inhale 1-2 puffs into the lungs every 6 (six) hours as needed for wheezing or shortness of breath. 8 g 3  . amLODipine (NORVASC) 10 MG tablet Take 1 tablet (10 mg total) by mouth daily. 30 tablet 2  . amoxicillin (AMOXIL) 500 MG capsule Take 2 capsules (1,000 mg total) by mouth 2 (two) times daily. 40 capsule 0  . hydrochlorothiazide (HYDRODIURIL) 12.5 MG tablet TAKE 1 TABLET(12.5 MG) BY MOUTH DAILY 30 tablet 2  . labetalol (NORMODYNE) 200 MG tablet TAKE 1 TABLET(200 MG) BY MOUTH TWICE  DAILY 60 tablet 2  . levothyroxine (SYNTHROID) 25 MCG tablet Take 0.5 tablets (12.5 mcg total) by mouth daily at 6 (six) AM. 15 tablet 2  . montelukast (SINGULAIR) 10 MG tablet Take 1 tablet (10 mg total) by mouth at bedtime. 90 tablet 0  . potassium chloride (KLOR-CON) 20 MEQ packet Take 20 mEq by mouth 2 (two) times daily. 10 packet 0  . rosuvastatin (CRESTOR) 5 MG tablet Take 1 tablet (5 mg total) by mouth daily. 90 tablet 3  . Vitamin D, Ergocalciferol, (DRISDOL) 1.25 MG (50000 UNIT) CAPS capsule Take one tablet wkly 12 capsule 0  . warfarin (COUMADIN) 5 MG tablet Take 1 tablet (5 mg total) by mouth daily. 30 tablet 2  No current facility-administered medications for this visit.    Musculoskeletal: Strength & Muscle Tone: NA Gait & Station: UTA Patient leans: N/A  Psychiatric Specialty Exam: Review of Systems  Musculoskeletal: Positive for arthralgias.       Chronic knee pain, osteoarthritis   Psychiatric/Behavioral: Positive for dysphoric mood. The patient is nervous/anxious.   All other systems reviewed and are negative.   There were no vitals taken for this visit.There is no height or weight on file to calculate BMI.  General Appearance: NA  Eye Contact:  NA  Speech:  Clear and Coherent  Volume:  Normal  Mood:  Depressed  Affect:  Depressed  Thought Process:  Coherent  Orientation:  Full (Time, Place, and Person)  Thought Content:  Logical  Suicidal Thoughts:  No  Homicidal Thoughts:  No  Memory:  Immediate;   Good Recent;   Good Remote;   Good  Judgement:  Good  Insight:  Good  Psychomotor Activity:  NA  Concentration:  Concentration: Good  Recall:  Good  Fund of Knowledge:Good  Language: Good  Akathisia:  NA  Handed:  Right  AIMS (if indicated):  not done  Assets:  Desire for Improvement  ADL's:  Intact  Cognition: WNL  Sleep:  Poor   Screenings: GAD-7   Flowsheet Row Office Visit from 02/03/2020 in [place] Office Visit  from 11/10/2017 in [place] Office Visit from 04/29/2017 in [facility] Office Visit from 12/17/2016 in [place]  Total GAD-7 Score 0 4 6 5     PHQ2-9   [place] Office Visit from 02/03/2020 in [place] Office Visit from 11/25/2019 in [place] Office Visit from 01/28/2019 in [place] Office Visit from 08/20/2018 in [place] [facility] Office Visit from 07/07/2018 in [facility]  PHQ-2 Total Score 1 4 0 2 2  PHQ-9 Total Score 4 13 0 7 6      Assessment and Plan:  My major depressive disorder, recurrent, moderate: -Recommended starting SS RI, client declined, did not want any more medications -Agreeable to start therapy to work on grief and current stressors -Follow-up as needed if desires medications in the future  Virtual Visit via Video Note  I connected with Vinnie Langton Khim on 05/16/20 at  1:00 PM EST by a video enabled telemedicine application and verified that I am speaking with the correct person using two identifiers.  Location: Patient: home Provider: [place]   I discussed the limitations of evaluation and management by telemedicine and the availability of in person appointments. The patient expressed understanding and agreed to proceed.   Follow Up Instructions: Follow up if decide to start medications, follow up with therapy   I discussed the assessment and treatment plan with the patient. The patient was provided an opportunity to ask questions and all were answered. The patient agreed with the plan and demonstrated an understanding of the instructions.   The patient was advised to call back or seek an in-person evaluation if the symptoms worsen or if the condition fails to improve as anticipated.  I provided 60 minutes of non-face-to-face time during  this encounter.   Waylan Boga, NP    Waylan Boga, NP 12/14/20214:22 PM

## 2020-05-17 ENCOUNTER — Ambulatory Visit: Payer: Medicaid Other | Admitting: Pharmacist

## 2020-05-23 ENCOUNTER — Other Ambulatory Visit: Payer: Medicaid Other

## 2020-05-25 ENCOUNTER — Telehealth: Payer: Self-pay | Admitting: Internal Medicine

## 2020-05-25 ENCOUNTER — Ambulatory Visit: Payer: Medicaid Other | Admitting: Pharmacist

## 2020-05-25 NOTE — Telephone Encounter (Signed)
Copied from [place] [id number]. Topic: General - Other >> May 25, 2020  2:24 PM Hinda Lenis D wrote: PT call to reschedule her appt coumadin / please advise   Called patient and LVM advising her to call back [phone] to reschedule appt.

## 2020-06-03 DIAGNOSIS — Z419 Encounter for procedure for purposes other than remedying health state, unspecified: Secondary | ICD-10-CM | POA: Diagnosis not present

## 2020-06-13 ENCOUNTER — Encounter: Payer: Medicaid Other | Admitting: Internal Medicine

## 2020-06-21 ENCOUNTER — Ambulatory Visit: Payer: Medicaid Other | Admitting: Pharmacist

## 2020-07-04 DIAGNOSIS — Z419 Encounter for procedure for purposes other than remedying health state, unspecified: Secondary | ICD-10-CM | POA: Diagnosis not present

## 2020-07-07 ENCOUNTER — Encounter: Payer: Self-pay | Admitting: Internal Medicine

## 2020-07-07 ENCOUNTER — Other Ambulatory Visit: Payer: Self-pay | Admitting: Internal Medicine

## 2020-07-07 ENCOUNTER — Other Ambulatory Visit: Payer: Self-pay

## 2020-07-07 ENCOUNTER — Ambulatory Visit: Payer: Medicaid Other | Attending: Internal Medicine | Admitting: Internal Medicine

## 2020-07-07 VITALS — BP 152/101 | HR 90 | Resp 16 | Ht 61.0 in | Wt 358.6 lb

## 2020-07-07 DIAGNOSIS — Z5900 Homelessness unspecified: Secondary | ICD-10-CM

## 2020-07-07 DIAGNOSIS — Z2821 Immunization not carried out because of patient refusal: Secondary | ICD-10-CM | POA: Diagnosis not present

## 2020-07-07 DIAGNOSIS — E782 Mixed hyperlipidemia: Secondary | ICD-10-CM | POA: Diagnosis not present

## 2020-07-07 DIAGNOSIS — I10 Essential (primary) hypertension: Secondary | ICD-10-CM | POA: Diagnosis not present

## 2020-07-07 DIAGNOSIS — F321 Major depressive disorder, single episode, moderate: Secondary | ICD-10-CM

## 2020-07-07 DIAGNOSIS — I2699 Other pulmonary embolism without acute cor pulmonale: Secondary | ICD-10-CM | POA: Diagnosis not present

## 2020-07-07 DIAGNOSIS — E039 Hypothyroidism, unspecified: Secondary | ICD-10-CM | POA: Diagnosis not present

## 2020-07-07 LAB — POCT INR: INR: 1 — AB (ref 2.0–3.0)

## 2020-07-07 MED ORDER — ROSUVASTATIN CALCIUM 5 MG PO TABS: 5.0000 mg | ORAL_TABLET | Freq: Every day | ORAL | 6 refills | Status: DC

## 2020-07-07 MED ORDER — AMLODIPINE BESYLATE 10 MG PO TABS: 10.0000 mg | ORAL_TABLET | Freq: Every day | ORAL | 6 refills | Status: DC

## 2020-07-07 MED ORDER — HYDROCHLOROTHIAZIDE 12.5 MG PO TABS: ORAL_TABLET | ORAL | 6 refills | Status: DC

## 2020-07-07 MED ORDER — LABETALOL HCL 200 MG PO TABS: ORAL_TABLET | ORAL | 5 refills | Status: DC

## 2020-07-07 MED ORDER — LEVOTHYROXINE SODIUM 25 MCG PO TABS: 12.5000 ug | ORAL_TABLET | Freq: Every day | ORAL | 2 refills | Status: DC

## 2020-07-07 MED FILL — ROSUVASTATIN CALCIUM 5 MG T: 5 | 30 days supply | Qty: 30 | Fill #0

## 2020-07-07 MED FILL — LABETALOL HCL 200 MG TABS: 200 | 30 days supply | Qty: 60 | Fill #0

## 2020-07-07 MED FILL — LEVOTHYROXINE SODIUM 25 MCG: 25 | 30 days supply | Qty: 15 | Fill #0

## 2020-07-07 MED FILL — PROAIR HFA 90 MCG INHALER: 108 (90 BAS | 25 days supply | Qty: 9 | Fill #2

## 2020-07-07 MED FILL — HYDROCHLOROTHIAZIDE 12.5 MG: 12.5 | 30 days supply | Qty: 30 | Fill #0

## 2020-07-07 MED FILL — AMLODIPINE BESYLATE 10 MG T: 10 | 30 days supply | Qty: 30 | Fill #0

## 2020-07-07 NOTE — Patient Instructions (Signed)
From [facility]: Take 1 extra tablet tonight. Take 2 tablets tomorrow. Then, continue taking 1 tablet every day. Come back to see me in 1 week.

## 2020-07-07 NOTE — Progress Notes (Signed)
Patient ID: Angela [name], female    DOB: [birth year]  MRN: [id number]  CC: Annual Exam, Depression, and Medication Refill   Subjective: Angela [name] is a 45 y.o. female who presents for follow-up visit.  She was supposed to have Pap smear/annual exam but she wants to defer on this today. Her concerns today include:  Hx of HTN, HL, morbid obesity, hypothyroid, unprovoked BL PE  Depression:  Mom died 4 mths ago.  Very depressed about it.  She feels lost without her mom.  She lived with her mom in the apartment that her mom was paying for.  Since her mom died, she is now living in a to bed motel room with her 8 grandchildren.  She is working with a Education officer, museum trying to get housing.  She states that she is on the list for section 8.  She has no transportation.  No good family support.  Reports decreased appetite.  She had not been taking her medicines consistently for several months. -She had seen a counselor in December but has not had any further visit.  She is not wanting to be placed on any medication for depression.  PE: Had not been taking Coumadin consistently.  States that she recently missed several days.   Missed last appt with the clinical pharmacist for INR check.  He will see her today. No bruising or bleeding  HTN: Patient admits that she was not taking her medicines for several months after her mother died.  Just started taking them again.  She took medicines shortly before coming to this visit. Not taking Potassium  Hypothyroid: did not take in last 4 mths.  Depressed and stress, hair coming out.    HL: Off Crestor for about 4 months.    Obesity: no appetite at times.   Eating smaller portions Was in [facility] and plans to go back once she is doing better emotionally.  HM:  Decline COVID-19 vaccine and flu.  Due for colon CA screening.  Would like to do [place] but right now she does not have address. Patient Active Problem List   Diagnosis Date Noted   . Major depressive disorder, recurrent episode, moderate ([place]) 05/16/2020  . AKI (acute kidney injury) ([place]) 12/31/2019  . Asthma 12/31/2019  . Hypothyroidism 12/31/2019  . Acute pulmonary embolism ([place]) 12/30/2019  . Essential hypertension 07/08/2018  . Morbid obesity ([place]) 07/08/2018  . Neuropathy of both feet 07/08/2018  . Primary osteoarthritis of both knees 04/02/2018  . Foot pain, right 04/02/2018  . 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 HTN (hypertension), benign 12/17/2016  . Pain, pelvic, female 02/05/2012  . Fibroids 02/05/2012  . Abnormal uterine bleeding 02/05/2012     Current Outpatient Medications on File Prior to Visit  Medication Sig Dispense Refill  . albuterol (PROVENTIL) (2.5 MG/3ML) 0.083% nebulizer solution Take 3 mLs (2.5 mg total) by nebulization every 6 (six) hours as needed for wheezing or shortness of breath. 75 mL 12  . albuterol (VENTOLIN HFA) 108 (90 Base) MCG/ACT inhaler Inhale 1-2 puffs into the lungs every 6 (six) hours as needed for wheezing or shortness of breath. 8 g 3  . warfarin (COUMADIN) 5 MG tablet Take 1 tablet (5 mg total) by mouth daily. 30 tablet 2  . Vitamin D, Ergocalciferol, (DRISDOL) 1.25 MG (50000 UNIT) CAPS capsule Take one tablet wkly (Patient not taking: Reported on 07/07/2020) 12 capsule 0   No current facility-administered medications on file prior to visit.  Allergies  Allergen Reactions  . Doxycycline Nausea And Vomiting  . Flagyl [Metronidazole Hcl] Hives and Nausea And Vomiting    Social History   Socioeconomic History  . Marital status: Single    Spouse name: Not on file  . Number of children: Not on file  . Years of education: Not on file  . Highest education level: Not on file  Occupational History  . Occupation: Surveyor, quantity  Tobacco Use  . Smoking status: Never Smoker  . Smokeless tobacco: Never Used  Vaping Use  . Vaping Use: Never used  Substance and Sexual Activity  . Alcohol use: No  .  Drug use: No  . Sexual activity: Yes    Birth control/protection: Condom, Surgical  Other Topics Concern  . Not on file  Social History Narrative  . Not on file   Social Determinants of Health   Financial Resource Strain: Not on file  Food Insecurity: Not on file  Transportation Needs: Not on file  Physical Activity: Not on file  Stress: Not on file  Social Connections: Not on file  Intimate Partner Violence: Not on file    Family History  Problem Relation Age of Onset  . Hyperlipidemia Mother   . Hypertension Mother   . Thyroid disease Mother   . Obesity Mother   . Clotting disorder Mother   . Cancer Son   . Hypertension Other   . Diabetes Other   . Other Neg Hx     Past Surgical History:  Procedure Laterality Date  . stye removal   1994  . TUBAL LIGATION  1998    ROS: Review of Systems Negative except as stated above  PHYSICAL EXAM: BP (!) 152/101   Pulse 90   Resp 16   Ht 5\' 1"  (1.549 m)   Wt (!) 358 lb 9.6 oz (162.7 kg)   SpO2 95%   BMI 67.76 kg/m   Wt Readings from Last 3 Encounters:  07/07/20 (!) 358 lb 9.6 oz (162.7 kg)  03/28/20 (!) 346 lb 6.4 oz (157.1 kg)  02/03/20 (!) 357 lb (161.9 kg)  BP 158/90  Physical Exam  General appearance - alert, well appearing, and in no distress Mental status -patient very tearful.   Neck - supple, no significant adenopathy Chest - clear to auscultation, no wheezes, rales or rhonchi, symmetric air entry Heart - normal rate, regular rhythm, normal S1, S2, no murmurs, rubs, clicks or gallops   Depression screen [facility] 2/9 07/07/2020 02/03/2020 11/25/2019 01/28/2019 08/20/2018  Decreased Interest 1 1 3  0 1  Down, Depressed, Hopeless 1 0 1 0 1  PHQ - 2 Score 2 1 4  0 2  Altered sleeping 1 0 1 0 1  Tired, decreased energy 1 3 3  0 1  Change in appetite 1 0 2 0 1  Feeling bad or failure about yourself  0 0 1 0 1  Trouble concentrating 0 0 1 0 1  Moving slowly or fidgety/restless 0 0 1 0 -  Suicidal thoughts 0 0 0 0 -   PHQ-9 Score 5 4 13  0 7  Difficult doing work/chores - - Somewhat difficult Not difficult at all -  Some recent data might be hidden    CMP Latest Ref Rng & Units 03/21/2020 02/03/2020 01/05/2020  Glucose 70 - 99 mg/dL 93 97 104(H)  BUN 6 - 20 mg/dL 7 17 11   Creatinine 0.44 - 1.00 mg/dL 1.12(H) 1.18(H) 1.28(H)  Sodium 135 - 145 mmol/L 139 145(H) 141  Potassium 3.5 - 5.1 mmol/L 3.2(L) 3.8 3.5  Chloride 98 - 111 mmol/L 101 103 104  CO2 22 - 32 mmol/L 27 28 27   Calcium 8.9 - 10.3 mg/dL 8.7(L) 9.8 9.5  Total Protein 6.5 - 8.1 g/dL 8.1 - -  Total Bilirubin 0.3 - 1.2 mg/dL 0.5 - -  Alkaline Phos 38 - 126 U/L 65 - -  AST 15 - 41 U/L 26 - -  ALT 0 - 44 U/L 14 - -   Lipid Panel     Component Value Date/Time   CHOL 180 11/25/2019 1254   TRIG 74 11/25/2019 1254   HDL 51 11/25/2019 1254   CHOLHDL 3.5 11/25/2019 1254   LDLCALC 115 (H) 11/25/2019 1254    CBC    Component Value Date/Time   WBC 4.3 03/21/2020 0139   RBC 4.61 03/21/2020 0139   HGB 12.3 03/21/2020 0139   HGB 12.4 02/03/2020 1536   HCT 40.2 03/21/2020 0139   HCT 38.7 02/03/2020 1536   PLT 274 03/21/2020 0139   PLT 317 02/03/2020 1536   MCV 87.2 03/21/2020 0139   MCV 85 02/03/2020 1536   MCH 26.7 03/21/2020 0139   MCHC 30.6 03/21/2020 0139   RDW 14.6 03/21/2020 0139   RDW 13.3 02/03/2020 1536   LYMPHSABS 1.6 03/21/2020 0139   LYMPHSABS 1.7 02/03/2020 1536   MONOABS 0.5 03/21/2020 0139   EOSABS 0.0 03/21/2020 0139   EOSABS 0.3 02/03/2020 1536   BASOSABS 0.0 03/21/2020 0139   BASOSABS 0.0 02/03/2020 1536    ASSESSMENT AND PLAN:  1. Depression, major, single episode, moderate ([place]) Inquired and encouraged to consider taking an antidepressant but patient is reluctant to do so.  She is agreeable to going back to behavioral health and seeing the NP whom she saw in December.  I will send her an inbox message requesting that she schedule a follow-up appointment to see this patient.    2. Essential hypertension Not at  goal.  Patient was not taking medicines consistently.  Encouraged her to get back on the medicines and take them consistently. - Basic Metabolic Panel - amLODipine (NORVASC) 10 MG tablet; Take 1 tablet (10 mg total) by mouth daily.  Dispense: 30 tablet; Refill: 6 - labetalol (NORMODYNE) 200 MG tablet; TAKE 1 TABLET(200 MG) BY MOUTH TWICE DAILY  Dispense: 60 tablet; Refill: 5 - hydrochlorothiazide (HYDRODIURIL) 12.5 MG tablet; TAKE 1 TABLET(12.5 MG) BY MOUTH DAILY  Dispense: 30 tablet; Refill: 6  3. Morbid obesity ([place]) Encourage healthy eating habits  4. Hypothyroidism, unspecified type - TSH - levothyroxine (SYNTHROID) 25 MCG tablet; Take 0.5 tablets (12.5 mcg total) by mouth daily at 6 (six) AM.  Dispense: 15 tablet; Refill: 2  5. Bilateral pulmonary embolism ([place]) Seen by clinical pharmacist today.  INR was not at goal.  He will adjust the Coumadin - INR  6. Homeless family   45. Mixed hyperlipidemia - rosuvastatin (CRESTOR) 5 MG tablet; Take 1 tablet (5 mg total) by mouth daily.  Dispense: 30 tablet; Refill: 6    Patient was given the opportunity to ask questions.  Patient verbalized understanding of the plan and was able to repeat key elements of the plan.   Orders Placed This Encounter  Procedures  . TSH  . Basic Metabolic Panel  . INR     Requested Prescriptions   Signed Prescriptions Disp Refills  . levothyroxine (SYNTHROID) 25 MCG tablet 15 tablet 2    Sig: Take 0.5 tablets (12.5 mcg total) by mouth daily  at 6 (six) AM.  . rosuvastatin (CRESTOR) 5 MG tablet 30 tablet 6    Sig: Take 1 tablet (5 mg total) by mouth daily.  Marland Kitchen amLODipine (NORVASC) 10 MG tablet 30 tablet 6    Sig: Take 1 tablet (10 mg total) by mouth daily.  Marland Kitchen labetalol (NORMODYNE) 200 MG tablet 60 tablet 5    Sig: TAKE 1 TABLET(200 MG) BY MOUTH TWICE DAILY  . hydrochlorothiazide (HYDRODIURIL) 12.5 MG tablet 30 tablet 6    Sig: TAKE 1 TABLET(12.5 MG) BY MOUTH DAILY    Return in about 6 weeks  (around 08/18/2020) for 1 week f/u with luke .  Karle Plumber, MD, FACP

## 2020-07-08 LAB — BASIC METABOLIC PANEL
BUN/Creatinine Ratio: 14 (ref 9–23)
BUN: 17 mg/dL (ref 6–24)
CO2: 27 mmol/L (ref 20–29)
Calcium: 10.2 mg/dL (ref 8.7–10.2)
Chloride: 104 mmol/L (ref 96–106)
Creatinine, Ser: 1.2 mg/dL — ABNORMAL HIGH (ref 0.57–1.00)
GFR calc Af Amer: 63 mL/min/{1.73_m2} (ref >59)
GFR calc non Af Amer: 54 mL/min/{1.73_m2} — ABNORMAL LOW (ref >59)
Glucose: 104 mg/dL — ABNORMAL HIGH (ref 65–99)
Potassium: 4.2 mmol/L (ref 3.5–5.2)
Sodium: 143 mmol/L (ref 134–144)

## 2020-07-08 LAB — TSH: TSH: 4.92 u[IU]/mL — ABNORMAL HIGH (ref 0.450–4.500)

## 2020-07-14 ENCOUNTER — Ambulatory Visit: Payer: Medicaid Other | Attending: Internal Medicine | Admitting: Pharmacist

## 2020-07-14 ENCOUNTER — Other Ambulatory Visit: Payer: Self-pay

## 2020-07-14 DIAGNOSIS — I2699 Other pulmonary embolism without acute cor pulmonale: Secondary | ICD-10-CM | POA: Diagnosis not present

## 2020-07-14 LAB — POCT INR: INR: 1.4 — AB (ref 2.0–3.0)

## 2020-07-14 MED ORDER — WARFARIN SODIUM 5 MG PO TABS
ORAL_TABLET | ORAL | 2 refills | Status: DC
Start: 2020-07-14 — End: 2021-03-05

## 2020-07-14 NOTE — Addendum Note (Signed)
Addended by: Daisy Blossom, Annie Main L on: 07/14/2020 03:07 PM   Modules accepted: Orders

## 2020-07-17 ENCOUNTER — Telehealth (HOSPITAL_COMMUNITY): Payer: Medicaid Other

## 2020-07-17 ENCOUNTER — Other Ambulatory Visit: Payer: Self-pay

## 2020-07-21 ENCOUNTER — Ambulatory Visit: Payer: Medicaid Other | Admitting: Pharmacist

## 2020-07-23 NOTE — Progress Notes (Deleted)
Cardiology Office Note:    Date:  07/23/2020   ID:  Angela [name] [birth year], MRN [id number]  PCP:  Ladell Pier, MD   [place]  Cardiologist:  Freada Bergeron, MD *** Advanced Practice Provider:  No care team member to display Electrophysiologist:  None    Referring MD: Ladell Pier, MD     History of Present Illness:    Angela [name] is a 45 y.o. female with a hx of essential HTN, morbid obesity, recent unprovoked bilateral pulmonary emboli in 12/2019, asthma, and hypothyroidism who returns to clinic for follow-up of her PE.  Patient was in the ED on 03/21/20 with severe left sided chest pain and hemoptysis. CTA chest showed no evidence of recurrent PE but possible bilateral pneumonia consistent with viral pneumonia. INR therapeutic at that time at 2.9. COVID negative. Discharged home on amoxicillin with plans for follow-up with PCP.  During our last visit on 03/28/20, the patient was struggling with the grief of losing her mother. Housing has become an issue and she is currently residing in a motel room with 8 grandchildren. We had also recommended her to follow with Heme given history of unprovoked PE, but she has not yet followed up.   Past Medical History:  Diagnosis Date  . Allergies   . Asthma   . Back pain   . Depression    hx, doing ok now  . Edema, lower extremity   . GERD (gastroesophageal reflux disease)   . Headache(784.0)   . Heart murmur   . Hypertension    on meds  . IBS (irritable bowel syndrome)   . Joint pain   . Major depressive disorder, recurrent episode, moderate ([place]) 05/16/2020  . Neuropathy   . Obesity   . Osteoarthritis   . Ovarian cyst   . Prediabetes   . Preterm labor   . Vitamin D deficiency     Past Surgical History:  Procedure Laterality Date  . stye removal   1994  . TUBAL LIGATION  1998    Current Medications: No outpatient medications have been marked as taking for the  07/25/20 encounter (Appointment) with Freada Bergeron, MD.     Allergies:   Doxycycline and Flagyl [metronidazole hcl]   Social History   Socioeconomic History  . Marital status: Single    Spouse name: Not on file  . Number of children: Not on file  . Years of education: Not on file  . Highest education level: Not on file  Occupational History  . Occupation: Surveyor, quantity  Tobacco Use  . Smoking status: Never Smoker  . Smokeless tobacco: Never Used  Vaping Use  . Vaping Use: Never used  Substance and Sexual Activity  . Alcohol use: No  . Drug use: No  . Sexual activity: Yes    Birth control/protection: Condom, Surgical  Other Topics Concern  . Not on file  Social History Narrative  . Not on file   Social Determinants of Health   Financial Resource Strain: Not on file  Food Insecurity: Not on file  Transportation Needs: Not on file  Physical Activity: Not on file  Stress: Not on file  Social Connections: Not on file     Family History: The patient's ***family history includes Cancer in her son; Clotting disorder in her mother; Diabetes in an other family member; Hyperlipidemia in her mother; Hypertension in her mother and another family member; Obesity in her mother; Thyroid disease  in her mother. There is no history of Other.  ROS:   Please see the history of present illness.    *** All other systems reviewed and are negative.  EKGs/Labs/Other Studies Reviewed:    The following studies were reviewed today: IMPRESSIONS    1. Left ventricular ejection fraction, by estimation, is 60 to 65%. The  left ventricle has normal function. The left ventricle has no regional  wall motion abnormalities. There is moderate left ventricular hypertrophy  and severe basal hypertrophy.  2. Right ventricular systolic function is normal. The right ventricular  size is normal. There is severely elevated pulmonary artery systolic  pressure. The estimated right  ventricular systolic pressure is 67.3 mmHg.  3. The mitral valve is normal in structure. No evidence of mitral valve  regurgitation. No evidence of mitral stenosis.  4. Tricuspid valve regurgitation is mild to moderate.  5. The aortic valve is normal in structure. Aortic valve regurgitation is  not visualized. No aortic stenosis is present.  6. The inferior vena cava is dilated in size with >50% respiratory  variability, suggesting right atrial pressure of 8 mmHg.    12/31/19 LE doppler ultrasound: Summary:  RIGHT:  - There is no evidence of deep vein thrombosis in the lower extremity.  However, portions of this examination were limited- see technologist  comments above.    LEFT:  - There is no evidence of deep vein thrombosis in the lower extremity.  However, portions of this examination were limited- see technologist  comments above.   Bilateral ABI 07/2018: Summary:  Right: Resting right ankle-brachial index is within normal range. No  evidence of significant right lower extremity arterial disease. The right  toe-brachial index is normal. RT great toe pressure = 185 mmHg.   Left: Resting left ankle-brachial index indicates noncompressible left  lower extremity arteries.The left toe-brachial index is normal. LT Great  toe pressure = 176 mmHg.   CTA 03/2020: FINDINGS: Cardiovascular: Mild cardiomegaly. No pericardial effusion. The thoracic aorta is unremarkable. Evaluation of the pulmonary arteries is limited due to respiratory motion artifact and suboptimal opacification and timing of the contrast. No large central pulmonary artery embolus identified. V/Q scan may provide additional information if there is high clinical concern for acute PE.  Mediastinum/Nodes: There is no hilar or mediastinal adenopathy. The esophagus and the thyroid gland are grossly unremarkable. No mediastinal fluid collection.  Lungs/Pleura: Bilateral streaky and confluent airspace  opacities most consistent with multifocal pneumonia, likely viral or atypical in etiology including COVID-19. Clinical correlation is recommended. No lobar consolidation, pleural effusion, pneumothorax. The central airways are patent.  Upper Abdomen: No acute abnormality.  Musculoskeletal: No chest wall abnormality. No acute or significant osseous findings.  Review of the MIP images confirms the above findings.  IMPRESSION: 1. No CT evidence of central pulmonary artery embolus. 2. Multifocal pneumonia, likely viral or atypical in etiology. Clinical correlation is recommended. 3. Mild cardiomegaly.    EKG:  EKG is *** ordered today.  The ekg ordered today demonstrates ***  Recent Labs: 12/31/2019: B Natriuretic Peptide 1,164.3 03/21/2020: ALT 14; Hemoglobin 12.3; Platelets 274 07/07/2020: BUN 17; Creatinine, Ser 1.20; Potassium 4.2; Sodium 143; TSH 4.920  Recent Lipid Panel    Component Value Date/Time   CHOL 180 11/25/2019 1254   TRIG 74 11/25/2019 1254   HDL 51 11/25/2019 1254   CHOLHDL 3.5 11/25/2019 1254   LDLCALC 115 (H) 11/25/2019 1254     Risk Assessment/Calculations:   {Does this patient have ATRIAL FIBRILLATION?:[phone]}  Physical Exam:    VS:  There were no vitals taken for this visit.    Wt Readings from Last 3 Encounters:  07/07/20 (!) 358 lb 9.6 oz (162.7 kg)  03/28/20 (!) 346 lb 6.4 oz (157.1 kg)  02/03/20 (!) 357 lb (161.9 kg)     GEN: *** Well nourished, well developed in no acute distress HEENT: Normal NECK: No JVD; No carotid bruits LYMPHATICS: No lymphadenopathy CARDIAC: ***RRR, no murmurs, rubs, gallops RESPIRATORY:  Clear to auscultation without rales, wheezing or rhonchi  ABDOMEN: Soft, non-tender, non-distended MUSCULOSKELETAL:  No edema; No deformity  SKIN: Warm and dry NEUROLOGIC:  Alert and oriented x 3 PSYCHIATRIC:  Normal affect   ASSESSMENT:    No diagnosis found. PLAN:    In order of problems listed  above:  #Unprovoked Pulmonary Embolism: Diagnosed on 12/30/19 when she presented to the ED with worsening SOB. No provoking factors (no recent long car/plane ride, period of immobility, surgery, illness, OCP/HRT use etc). CTA showed "Emboli involve both main pulmonary arteries extending into all lobar and many segmental and subsegmental branches. Straightening of the intraventricular septum with RV to LV ratio of 2.1. Minimal contrast refluxes into the hepatic veins and IVC." She was treated with IV heparin later transitioned to warfarin upon discharge.  -Continue warfarin 5mg  daily -INR followed by PCP; has been therapeutic -May consider checking hypercoaguable panel at 62months given history of clotting disorder in mother; will refer to heme for further management -Anticipate at least 53month course of warfarin-->heme referral as above -No DOAC for now due to morbid obesity with BMI 65 -Discussed how she cannot take HRT in the future; no tobacco use  #HTN: Well controlled at home with BP 120s/80s. -Continue amlodipine 10mg  daily and HCTZ 12.5mg  daily  #HLD: -Resume crestor 5mg  daily  #Morbid Obesity BMI 65: Discussed that weight loss will significantly decrease her CV risk. She was previously followed at weight loss clinic but has not followed up since the loss of her mother in September, but is amenable to going again. -Follow-up in weight loss clinic  #Hypothyroidism: -Continue synthroid 37mcg   {Are you ordering a CV Procedure (e.g. stress test, cath, DCCV, TEE, etc)?   Press F2        :[id number]}    Medication Adjustments/Labs and Tests Ordered: Current medicines are reviewed at length with the patient today.  Concerns regarding medicines are outlined above.  No orders of the defined types were placed in this encounter.  No orders of the defined types were placed in this encounter.   There are no Patient Instructions on file for this visit.   Signed, Freada Bergeron, MD  07/23/2020 4:39 PM    [place]

## 2020-07-24 ENCOUNTER — Ambulatory Visit: Payer: Medicaid Other | Admitting: Pharmacist

## 2020-07-25 ENCOUNTER — Ambulatory Visit: Payer: Medicaid Other | Admitting: Cardiology

## 2020-08-01 DIAGNOSIS — Z419 Encounter for procedure for purposes other than remedying health state, unspecified: Secondary | ICD-10-CM | POA: Diagnosis not present

## 2020-08-03 ENCOUNTER — Other Ambulatory Visit: Payer: Self-pay

## 2020-08-03 ENCOUNTER — Ambulatory Visit: Payer: Medicaid Other | Attending: Internal Medicine | Admitting: Pharmacist

## 2020-08-03 DIAGNOSIS — I2699 Other pulmonary embolism without acute cor pulmonale: Secondary | ICD-10-CM

## 2020-08-03 LAB — POCT INR: INR: 1.8 — AB (ref 2.0–3.0)

## 2020-08-06 NOTE — Progress Notes (Deleted)
Cardiology Office Note:    Date:  08/06/2020   ID:  Melisse, Caetano 1976/02/14, MRN [id number]  PCP:  Ladell Pier, MD   [place]  Cardiologist:  Freada Bergeron, MD *** Advanced Practice Provider:  No care team member to display Electrophysiologist:  None  {Press F2 to show EP APP, CHF, sleep or structural heart MD               :[id number]}  { Click here to update then REFRESH NOTE - MD (PCP) or APP (Team Member)  Change PCP Type for MD, Specialty for APP is either Cardiology or Clinical Cardiac Electrophysiology  :[id number]}   Referring MD: Ladell Pier, MD   No chief complaint on file. ***  History of Present Illness:    Angela [name] is a 45 y.o. female with a hx of hx of essential HTN,morbid obesity, recent unprovoked bilateral pulmonary emboli in 12/2019, asthma, and hypothyroidism who returns to clinic for follow-up of her PE.  Patient was in the ED on 03/21/20 with severe left sided chest pain and hemoptysis. CTA chest showed no evidence of recurrent PE but possible bilateral pneumonia consistent with viral pneumonia. INR therapeutic at that time at 2.9. COVID negative. Discharged home on amoxicillin with plans for follow-up with PCP.  During our last visit on 03/28/20, the patient was struggling with the grief of losing her mother. Housing has become an issue and she is currently residing in a motel room with 8 grandchildren. We had also recommended her to follow with Heme given history of unprovoked PE, but she has not yet followed up.  Past Medical History:  Diagnosis Date  . Allergies   . Asthma   . Back pain   . Depression    hx, doing ok now  . Edema, lower extremity   . GERD (gastroesophageal reflux disease)   . Headache(784.0)   . Heart murmur   . Hypertension    on meds  . IBS (irritable bowel syndrome)   . Joint pain   . Major depressive disorder, recurrent episode, moderate ([place]) 05/16/2020  .  Neuropathy   . Obesity   . Osteoarthritis   . Ovarian cyst   . Prediabetes   . Preterm labor   . Vitamin D deficiency     Past Surgical History:  Procedure Laterality Date  . stye removal   1994  . TUBAL LIGATION  1998    Current Medications: No outpatient medications have been marked as taking for the 08/08/20 encounter (Appointment) with Freada Bergeron, MD.     Allergies:   Doxycycline and Flagyl [metronidazole hcl]   Social History   Socioeconomic History  . Marital status: Single    Spouse name: Not on file  . Number of children: Not on file  . Years of education: Not on file  . Highest education level: Not on file  Occupational History  . Occupation: Surveyor, quantity  Tobacco Use  . Smoking status: Never Smoker  . Smokeless tobacco: Never Used  Vaping Use  . Vaping Use: Never used  Substance and Sexual Activity  . Alcohol use: No  . Drug use: No  . Sexual activity: Yes    Birth control/protection: Condom, Surgical  Other Topics Concern  . Not on file  Social History Narrative  . Not on file   Social Determinants of Health   Financial Resource Strain: Not on file  Food Insecurity: Not on file  Transportation Needs: Not on file  Physical Activity: Not on file  Stress: Not on file  Social Connections: Not on file     Family History: The patient's ***family history includes Cancer in her son; Clotting disorder in her mother; Diabetes in an other family member; Hyperlipidemia in her mother; Hypertension in her mother and another family member; Obesity in her mother; Thyroid disease in her mother. There is no history of Other.  ROS:   Please see the history of present illness.    *** All other systems reviewed and are negative.  EKGs/Labs/Other Studies Reviewed:    The following studies were reviewed today: hx of essential HTN,morbid obesity, recent unprovoked bilateral pulmonary emboli in 12/2019, asthma, and hypothyroidism who returns to  clinic for follow-up of her PE.  Patient was in the ED on 03/21/20 with severe left sided chest pain and hemoptysis. CTA chest showed no evidence of recurrent PE but possible bilateral pneumonia consistent with viral pneumonia. INR therapeutic at that time at 2.9. COVID negative. Discharged home on amoxicillin with plans for follow-up with PCP.  During our last visit on 03/28/20, the patient was struggling with the grief of losing her mother. Housing has become an issue and she is currently residing in a motel room with 8 grandchildren. We had also recommended her to follow with Heme given history of unprovoked PE, but she has not yet followed up.  EKG:  EKG is *** ordered today.  The ekg ordered today demonstrates ***  Recent Labs: 12/31/2019: B Natriuretic Peptide 1,164.3 03/21/2020: ALT 14; Hemoglobin 12.3; Platelets 274 07/07/2020: BUN 17; Creatinine, Ser 1.20; Potassium 4.2; Sodium 143; TSH 4.920  Recent Lipid Panel    Component Value Date/Time   CHOL 180 11/25/2019 1254   TRIG 74 11/25/2019 1254   HDL 51 11/25/2019 1254   CHOLHDL 3.5 11/25/2019 1254   LDLCALC 115 (H) 11/25/2019 1254     Risk Assessment/Calculations:   {Does this patient have ATRIAL FIBRILLATION?[phone]}   Physical Exam:    VS:  There were no vitals taken for this visit.    Wt Readings from Last 3 Encounters:  07/07/20 (!) 358 lb 9.6 oz (162.7 kg)  03/28/20 (!) 346 lb 6.4 oz (157.1 kg)  02/03/20 (!) 357 lb (161.9 kg)     GEN: *** Well nourished, well developed in no acute distress HEENT: Normal NECK: No JVD; No carotid bruits LYMPHATICS: No lymphadenopathy CARDIAC: ***RRR, no murmurs, rubs, gallops RESPIRATORY:  Clear to auscultation without rales, wheezing or rhonchi  ABDOMEN: Soft, non-tender, non-distended MUSCULOSKELETAL:  No edema; No deformity  SKIN: Warm and dry NEUROLOGIC:  Alert and oriented x 3 PSYCHIATRIC:  Normal affect   ASSESSMENT:    No diagnosis found. PLAN:    In order of  problems listed above:  #UnprovokedPulmonary Embolism: Diagnosed on 12/30/19 when she presented to the ED with worsening SOB.No provoking factors (no recent long car/plane ride, period of immobility, surgery, illness, OCP/HRT use etc).CTA showed "Emboli involve both main pulmonary arteries extending into all lobar and many segmental and subsegmental branches. Straightening of the intraventricular septum with RV to LV ratio of 2.1. Minimal contrast refluxes into the hepatic veins and IVC." She was treated with IV heparin later transitioned to warfarin upon discharge.  -Continue warfarin5mg  daily -INR followed by PCP; has been therapeutic -May consider checking hypercoaguable panel at 47months given history of clotting disorder in mother; will refer to heme for further management -Anticipate at least 25month course of warfarin-->heme referral as above -  No DOAC for now due to morbid obesity with BMI 65 -Discussed how she cannot take HRT in the future; no tobacco use  #HTN: Well controlled at home with BP 120s/80s. -Continue amlodipine 10mg  daily and HCTZ 12.5mg  daily  #HLD: -Resume crestor 5mg  daily  #Morbid Obesity BMI 65: Discussed that weight loss will significantly decrease her CV risk. She was previously followed at weight loss clinic but has not followed up since the loss of her mother in September, but is amenable to going again. -Follow-up in weight loss clinic  #Hypothyroidism: -Continue synthroid 13mcg   {Are you ordering a CV Procedure (e.g. stress test, cath, DCCV, TEE, etc)?   Press F2        :[id number]}    Medication Adjustments/Labs and Tests Ordered: Current medicines are reviewed at length with the patient today.  Concerns regarding medicines are outlined above.  No orders of the defined types were placed in this encounter.  No orders of the defined types were placed in this encounter.   There are no Patient Instructions on file for this visit.    Signed, Freada Bergeron, MD  08/06/2020 5:29 PM    [facility]

## 2020-08-08 ENCOUNTER — Ambulatory Visit: Payer: Medicaid Other | Admitting: Cardiology

## 2020-08-11 ENCOUNTER — Ambulatory Visit: Payer: Medicaid Other | Admitting: Pharmacist

## 2020-08-18 ENCOUNTER — Emergency Department (HOSPITAL_COMMUNITY)
Admission: EM | Admit: 2020-08-18 | Discharge: 2020-08-18 | Disposition: A | Discharge status: Home / Self Care | Payer: Medicaid Other | Attending: Emergency Medicine | Admitting: Emergency Medicine

## 2020-08-18 ENCOUNTER — Encounter (HOSPITAL_COMMUNITY): Payer: Self-pay | Admitting: Emergency Medicine

## 2020-08-18 ENCOUNTER — Emergency Department (HOSPITAL_COMMUNITY): Payer: Medicaid Other

## 2020-08-18 ENCOUNTER — Other Ambulatory Visit: Payer: Self-pay

## 2020-08-18 DIAGNOSIS — Z7901 Long term (current) use of anticoagulants: Secondary | ICD-10-CM | POA: Diagnosis not present

## 2020-08-18 DIAGNOSIS — I10 Essential (primary) hypertension: Secondary | ICD-10-CM | POA: Insufficient documentation

## 2020-08-18 DIAGNOSIS — J45909 Unspecified asthma, uncomplicated: Secondary | ICD-10-CM | POA: Diagnosis not present

## 2020-08-18 DIAGNOSIS — R519 Headache, unspecified: Secondary | ICD-10-CM | POA: Diagnosis present

## 2020-08-18 DIAGNOSIS — Z79899 Other long term (current) drug therapy: Secondary | ICD-10-CM | POA: Diagnosis not present

## 2020-08-18 DIAGNOSIS — J069 Acute upper respiratory infection, unspecified: Secondary | ICD-10-CM | POA: Insufficient documentation

## 2020-08-18 DIAGNOSIS — E039 Hypothyroidism, unspecified: Secondary | ICD-10-CM | POA: Insufficient documentation

## 2020-08-18 DIAGNOSIS — B9789 Other viral agents as the cause of diseases classified elsewhere: Secondary | ICD-10-CM | POA: Diagnosis not present

## 2020-08-18 DIAGNOSIS — R059 Cough, unspecified: Secondary | ICD-10-CM | POA: Diagnosis not present

## 2020-08-18 NOTE — ED Provider Notes (Signed)
[facility] DEPT Provider Note   CSN: [id number] Arrival date & time: 08/18/20  1549     History Chief Complaint  Patient presents with  . Cough  . Nasal Congestion    Angela [name] is a 45 y.o. female.  HPI   Patient with significant medical history of hypertension, prediabetes, presents with chief complaint of URI-like symptoms.  She endorses that she has been experiencing slight headache, nasal congestion and productive cough that all started yesterday.  She denies chest pain or shortness of breath, general body aches, states she is tolerating p.o. without difficulty.  She is not immunocompromise, denies getting vaccine is COVID-19 or influenza, she denies recent sick contacts.  Patient denies alleviating factors.  Patient has no cardiac history, has history of PEs currently on Coumadin has not missed any dosages.  Patient denies chest pain, shortness of breath, abdominal pain, nausea, vomiting, diarrhea, worsening pedal edema.  Past Medical History:  Diagnosis Date  . Allergies   . Asthma   . Back pain   . Depression    hx, doing ok now  . Edema, lower extremity   . GERD (gastroesophageal reflux disease)   . Headache(784.0)   . Heart murmur   . Hypertension    on meds  . IBS (irritable bowel syndrome)   . Joint pain   . Major depressive disorder, recurrent episode, moderate ([place]) 05/16/2020  . Neuropathy   . Obesity   . Osteoarthritis   . Ovarian cyst   . Prediabetes   . Preterm labor   . Vitamin D deficiency     Patient Active Problem List   Diagnosis Date Noted  . Major depressive disorder, recurrent episode, moderate ([place]) 05/16/2020  . AKI (acute kidney injury) ([place]) 12/31/2019  . Asthma 12/31/2019  . Hypothyroidism 12/31/2019  . Acute pulmonary embolism ([place]) 12/30/2019  . Essential hypertension 07/08/2018  . Morbid obesity ([place]) 07/08/2018  . Neuropathy of both feet 07/08/2018  . Primary osteoarthritis of both knees  04/02/2018  . Foot pain, right 04/02/2018  . 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 HTN (hypertension), benign 12/17/2016  . Pain, pelvic, female 02/05/2012  . Fibroids 02/05/2012  . Abnormal uterine bleeding 02/05/2012    Past Surgical History:  Procedure Laterality Date  . stye removal   1994  . TUBAL LIGATION  1998     OB History    Gravida  7   Para  5   Term  3   Preterm  2   AB  2   Living  5     SAB  2   IAB  0   Ectopic  0   Multiple  0   Live Births  5           Family History  Problem Relation Age of Onset  . Hyperlipidemia Mother   . Hypertension Mother   . Thyroid disease Mother   . Obesity Mother   . Clotting disorder Mother   . Cancer Son   . Hypertension Other   . Diabetes Other   . Other Neg Hx     Social History   Tobacco Use  . Smoking status: Never Smoker  . Smokeless tobacco: Never Used  Vaping Use  . Vaping Use: Never used  Substance Use Topics  . Alcohol use: No  . Drug use: No    Home Medications Prior to Admission medications   Medication Sig Start Date End Date Taking? Authorizing Provider  albuterol (  PROVENTIL) (2.5 MG/3ML) 0.083% nebulizer solution Take 3 mLs (2.5 mg total) by nebulization every 6 (six) hours as needed for wheezing or shortness of breath. 01/06/20   Shahmehdi, Valeria Batman, MD  albuterol (VENTOLIN HFA) 108 (90 Base) MCG/ACT inhaler Inhale 1-2 puffs into the lungs every 6 (six) hours as needed for wheezing or shortness of breath. 12/02/19   Ladell Pier, MD  amLODipine (NORVASC) 10 MG tablet Take 1 tablet (10 mg total) by mouth daily. 07/07/20   Ladell Pier, MD  hydrochlorothiazide (HYDRODIURIL) 12.5 MG tablet TAKE 1 TABLET(12.5 MG) BY MOUTH DAILY 07/07/20   Ladell Pier, MD  labetalol (NORMODYNE) 200 MG tablet TAKE 1 TABLET(200 MG) BY MOUTH TWICE DAILY 07/07/20   Ladell Pier, MD  levothyroxine (SYNTHROID) 25 MCG tablet Take 0.5 tablets (12.5 mcg total) by mouth daily at 6 (six) AM.  07/07/20   Ladell Pier, MD  rosuvastatin (CRESTOR) 5 MG tablet Take 1 tablet (5 mg total) by mouth daily. 07/07/20   Ladell Pier, MD  Vitamin D, Ergocalciferol, (DRISDOL) 1.25 MG (50000 UNIT) CAPS capsule Take one tablet wkly Patient not taking: Reported on 07/07/2020 02/11/20   Antony Blackbird, MD  warfarin (COUMADIN) 5 MG tablet Take as directed by the [facility]. 07/14/20   Ladell Pier, MD    Allergies    Doxycycline and Flagyl [metronidazole hcl]  Review of Systems   Review of Systems  Constitutional: Negative for chills and fever.  HENT: Positive for congestion. Negative for sore throat.   Eyes: Negative for visual disturbance.  Respiratory: Positive for cough. Negative for shortness of breath.   Cardiovascular: Negative for chest pain.  Gastrointestinal: Negative for abdominal pain, diarrhea, nausea and vomiting.  Genitourinary: Negative for enuresis.  Musculoskeletal: Negative for back pain.  Skin: Negative for rash.  Neurological: Positive for headaches. Negative for dizziness.  Hematological: Does not bruise/bleed easily.    Physical Exam Updated Vital Signs BP (!) 161/130 (BP Location: Left Arm)   Pulse 97   Temp 98.1 F (36.7 C) (Oral)   Resp 18   Ht 5\' 1"  (1.549 m)   Wt (!) 170 kg   SpO2 99%   BMI 70.81 kg/m   Physical Exam Vitals and nursing note reviewed.  Constitutional:      General: She is not in acute distress.    Appearance: She is not ill-appearing.  HENT:     Head: Normocephalic and atraumatic.     Right Ear: Tympanic membrane, ear canal and external ear normal.     Left Ear: Tympanic membrane, ear canal and external ear normal.     Nose: Congestion present.     Comments: Patient has erythematous turbinates    Mouth/Throat:     Mouth: Mucous membranes are moist.     Pharynx: Oropharynx is clear. No oropharyngeal exudate or posterior oropharyngeal erythema.  Eyes:     Conjunctiva/sclera: Conjunctivae normal.  Cardiovascular:      Rate and Rhythm: Normal rate and regular rhythm.     Pulses: Normal pulses.     Heart sounds: No murmur heard. No friction rub. No gallop.   Pulmonary:     Effort: No respiratory distress.     Breath sounds: No wheezing, rhonchi or rales.  Abdominal:     Palpations: Abdomen is soft.     Tenderness: There is no abdominal tenderness.  Musculoskeletal:     Right lower leg: No edema.     Left lower leg: No edema.  Skin:    General: Skin is warm and dry.  Neurological:     Mental Status: She is alert.  Psychiatric:        Mood and Affect: Mood normal.     ED Results / Procedures / Treatments   Labs (all labs ordered are listed, but only abnormal results are displayed) Labs Reviewed - No data to display  EKG None  Radiology DG Chest 2 View  Result Date: 08/18/2020 CLINICAL DATA:  Productive cough EXAM: CHEST - 2 VIEW COMPARISON:  03/20/2020 FINDINGS: Heart size upper limits of normal. Mediastinal shadows are normal. There may be central bronchial thickening but there is no infiltrate, collapse or effusion. The vascularity is normal. No bone abnormality. IMPRESSION: Possible bronchial thickening. No consolidation or collapse. Electronically Signed   By: Nelson Chimes M.D.   On: 08/18/2020 17:15    Procedures Procedures   Medications Ordered in ED Medications - No data to display  ED Course  I have reviewed the triage vital signs and the nursing notes.  Pertinent labs & imaging results that were available during my care of the patient were reviewed by me and considered in my medical decision making (see chart for details).    MDM Rules/Calculators/A&P                         Initial impression-patient presents with URI-like symptoms.  She is alert, does not appear in distress, to signs reassuring.  Will obtain chest x-ray further evaluation.  Recommended Covid test the patient denied  Work-up-chest x-ray negative for acute findings.  Rule out- Low suspicion for systemic  infection as patient is nontoxic-appearing, vital signs reassuring, no obvious source infection noted on exam.  Low suspicion for pneumonia as lung sounds are clear bilaterally, x-ray did not reveal any acute findings.  I have low suspicion for PE as patient denies pleuritic chest pain, shortness of breath, patient is PERC. low suspicion for strep throat as oropharynx was visualized, no erythema or exudates noted.  Low suspicion patient would need  hospitalized due to viral infection or Covid as vital signs reassuring, patient is not in respiratory distress.    Plan-suspect patient suffering from a viral URI, will recommend over-the-counter pain medication follow-up with PCP for further evaluation.  Vital signs have remained stable, no indication for hospital admission.  Patient given at home care as well strict return precautions.  Patient verbalized that they understood agreed to said plan.   Final Clinical Impression(s) / ED Diagnoses Final diagnoses:  Viral upper respiratory tract infection    Rx / DC Orders ED Discharge Orders    None       Marcello Fennel, PA-C 08/18/20 1750    Quintella Reichert, MD 08/18/20 1815

## 2020-08-18 NOTE — ED Triage Notes (Signed)
Patient states she is 'coughing up mucus' and that 'some is brown and some is like a green color.' Reports nasal congestion and a headache.

## 2020-08-18 NOTE — Discharge Instructions (Signed)
You have been seen here for URI like symptoms.  I recommend taking Tylenol for fever control and ibuprofen for pain control please follow dosing on the back of bottle.  I recommend staying hydrated and if you do not an appetite, I recommend soups as this will provide you with fluids and calories.    If symptoms continue  after 1 to 2 weeks  you may follow-up with your primary care provider  Come back to the emergency department if you develop chest pain, shortness of breath, severe abdominal pain, uncontrolled nausea, vomiting, diarrhea.

## 2020-08-21 ENCOUNTER — Telehealth: Payer: Self-pay | Admitting: *Deleted

## 2020-08-21 NOTE — Telephone Encounter (Signed)
Transition Care Management Unsuccessful Follow-up Telephone Call  Date of discharge and from where:  3-18 Angela [name]  Attempts:  1st  Reason for unsuccessful TCM follow-up call:   No answer/ left message

## 2020-08-22 NOTE — Telephone Encounter (Signed)
Transition Care Management Unsuccessful Follow-up Telephone Call  Date of discharge and from where:  08/18/2020 - [facility]  Attempts:  2nd Attempt  Reason for unsuccessful TCM follow-up call:  Left voice message

## 2020-08-22 NOTE — Progress Notes (Deleted)
Cardiology Office Note:    Date:  08/22/2020   ID:  Taquisha, Phung 26-May-1976, MRN [id number]  PCP:  Ladell Pier, MD   [place]  Cardiologist:  Freada Bergeron, MD  Advanced Practice Provider:  No care team member to display Electrophysiologist:  None   :[id number]}   Referring MD: Ladell Pier, MD    History of Present Illness:    ILLANA NOLTING is a 45 y.o. female with a hx of essential HTN, morbid obesity, recent unprovoked bilateral pulmonary emboli in 12/2019, asthma, and hypothyroidism who returns to clinic for follow-up.  Had ED visit on 03/21/20 where patient presented with severe left sided chest pain and hemoptysis. CTA chest showed no evidence of recurrent PE but possible bilateral pneumonia consistent with viral pneumonia. INR therapeutic at that time at 2.9. COVID negative.   Was initially seen by me on 03/28/20 where she was suffering from the loss of her mother. No chest pain, SOB or bleeding issues on her warfarin. We recommended heme follow-up given concern for hypercoaguable state, however, she has not yet followed up.  Past Medical History:  Diagnosis Date  . Allergies   . Asthma   . Back pain   . Depression    hx, doing ok now  . Edema, lower extremity   . GERD (gastroesophageal reflux disease)   . Headache(784.0)   . Heart murmur   . Hypertension    on meds  . IBS (irritable bowel syndrome)   . Joint pain   . Major depressive disorder, recurrent episode, moderate ([place]) 05/16/2020  . Neuropathy   . Obesity   . Osteoarthritis   . Ovarian cyst   . Prediabetes   . Preterm labor   . Vitamin D deficiency     Past Surgical History:  Procedure Laterality Date  . stye removal   1994  . TUBAL LIGATION  1998    Current Medications: No outpatient medications have been marked as taking for the 08/24/20 encounter (Appointment) with Freada Bergeron, MD.     Allergies:   Doxycycline and Flagyl  [metronidazole hcl]   Social History   Socioeconomic History  . Marital status: Single    Spouse name: Not on file  . Number of children: Not on file  . Years of education: Not on file  . Highest education level: Not on file  Occupational History  . Occupation: Surveyor, quantity  Tobacco Use  . Smoking status: Never Smoker  . Smokeless tobacco: Never Used  Vaping Use  . Vaping Use: Never used  Substance and Sexual Activity  . Alcohol use: No  . Drug use: No  . Sexual activity: Yes    Birth control/protection: Condom, Surgical  Other Topics Concern  . Not on file  Social History Narrative  . Not on file   Social Determinants of Health   Financial Resource Strain: Not on file  Food Insecurity: Not on file  Transportation Needs: Not on file  Physical Activity: Not on file  Stress: Not on file  Social Connections: Not on file     Family History: The patient's ***family history includes Cancer in her son; Clotting disorder in her mother; Diabetes in an other family member; Hyperlipidemia in her mother; Hypertension in her mother and another family member; Obesity in her mother; Thyroid disease in her mother. There is no history of Other.  ROS:   Please see the history of present illness.    ***  All other systems reviewed and are negative.  EKGs/Labs/Other Studies Reviewed:    The following studies were reviewed today: 12/31/19: Study Result    ECHOCARDIOGRAM REPORT       Patient Name:  DAISIA SLOMSKI Date of Exam: 12/31/2019  Medical Rec #: [id number]    Height:    61.0 in  Accession #:  [id number]    Weight:    371.0 lb  Date of Birth: 12-19-1975    BSA:     2.456 m  Patient Age:  55 years     BP:      146/113 mmHg  Patient Gender: F        HR:      90 bpm.  Exam Location: Inpatient   Procedure: 2D Echo   Indications:  Pulmonary Embolism    History:    Patient has no prior history of  Echocardiogram  examinations.         Signs/Symptoms:Murmur; Risk Factors:Hypertension.    Sonographer:  Mikki Santee RDCS (AE)  Referring Phys: [id number] [place]    1. Left ventricular ejection fraction, by estimation, is 60 to 65%. The  left ventricle has normal function. The left ventricle has no regional  wall motion abnormalities. There is moderate left ventricular hypertrophy  and severe basal hypertrophy.  2. Right ventricular systolic function is normal. The right ventricular  size is normal. There is severely elevated pulmonary artery systolic  pressure. The estimated right ventricular systolic pressure is 72.6 mmHg.  3. The mitral valve is normal in structure. No evidence of mitral valve  regurgitation. No evidence of mitral stenosis.  4. Tricuspid valve regurgitation is mild to moderate.  5. The aortic valve is normal in structure. Aortic valve regurgitation is  not visualized. No aortic stenosis is present.  6. The inferior vena cava is dilated in size with >50% respiratory  variability, suggesting right atrial pressure of 8 mmHg.    12/31/19 LE doppler ultrasound: Summary:  RIGHT:  - There is no evidence of deep vein thrombosis in the lower extremity.  However, portions of this examination were limited- see technologist  comments above.    LEFT:  - There is no evidence of deep vein thrombosis in the lower extremity.  However, portions of this examination were limited- see technologist  comments above.   Bilateral ABI 07/2018: Summary:  Right: Resting right ankle-brachial index is within normal range. No  evidence of significant right lower extremity arterial disease. The right  toe-brachial index is normal. RT great toe pressure = 185 mmHg.   Left: Resting left ankle-brachial index indicates noncompressible left  lower extremity arteries.The left toe-brachial index is normal. LT Great  toe pressure = 176 mmHg.    CTA 03/2020: FINDINGS: Cardiovascular: Mild cardiomegaly. No pericardial effusion. The thoracic aorta is unremarkable. Evaluation of the pulmonary arteries is limited due to respiratory motion artifact and suboptimal opacification and timing of the contrast. No large central pulmonary artery embolus identified. V/Q scan may provide additional information if there is high clinical concern for acute PE.  Mediastinum/Nodes: There is no hilar or mediastinal adenopathy. The esophagus and the thyroid gland are grossly unremarkable. No mediastinal fluid collection.  Lungs/Pleura: Bilateral streaky and confluent airspace opacities most consistent with multifocal pneumonia, likely viral or atypical in etiology including COVID-19. Clinical correlation is recommended. No lobar consolidation, pleural effusion, pneumothorax. The central airways are patent.  Upper Abdomen: No acute abnormality.  Musculoskeletal: No chest wall abnormality. No acute or  significant osseous findings.  Review of the MIP images confirms the above findings.  IMPRESSION: 1. No CT evidence of central pulmonary artery embolus. 2. Multifocal pneumonia, likely viral or atypical in etiology. Clinical correlation is recommended. 3. Mild cardiomegaly.     EKG:  EKG is *** ordered today.  The ekg ordered today demonstrates ***  Recent Labs: 12/31/2019: B Natriuretic Peptide 1,164.3 03/21/2020: ALT 14; Hemoglobin 12.3; Platelets 274 07/07/2020: BUN 17; Creatinine, Ser 1.20; Potassium 4.2; Sodium 143; TSH 4.920  Recent Lipid Panel    Component Value Date/Time   CHOL 180 11/25/2019 1254   TRIG 74 11/25/2019 1254   HDL 51 11/25/2019 1254   CHOLHDL 3.5 11/25/2019 1254   LDLCALC 115 (H) 11/25/2019 1254     Risk Assessment/Calculations:   {Does this patient have ATRIAL FIBRILLATION?:[id number]}   Physical Exam:    VS:  There were no vitals taken for this visit.    Wt Readings from Last 3 Encounters:   08/18/20 (!) 374 lb 12.5 oz (170 kg)  07/07/20 (!) 358 lb 9.6 oz (162.7 kg)  03/28/20 (!) 346 lb 6.4 oz (157.1 kg)     GEN: *** Well nourished, well developed in no acute distress HEENT: Normal NECK: No JVD; No carotid bruits LYMPHATICS: No lymphadenopathy CARDIAC: ***RRR, no murmurs, rubs, gallops RESPIRATORY:  Clear to auscultation without rales, wheezing or rhonchi  ABDOMEN: Soft, non-tender, non-distended MUSCULOSKELETAL:  No edema; No deformity  SKIN: Warm and dry NEUROLOGIC:  Alert and oriented x 3 PSYCHIATRIC:  Normal affect   ASSESSMENT:    No diagnosis found. PLAN:    In order of problems listed above:  #Unprovoked Pulmonary Embolism: Diagnosed on 12/30/19 when she presented to the ED with worsening SOB. No provoking factors (no recent long car/plane ride, period of immobility, surgery, illness, OCP/HRT use etc). CTA showed "Emboli involve both main pulmonary arteries extending into all lobar and many segmental and subsegmental branches. Straightening of theintraventricular septum with RV to LV ratio of 2.1. Minimal contrast refluxes into the hepatic veins and IVC." She was treated with IV heparin later transitioned to warfarin upon discharge.  -Continue warfarin 5mg  daily -INR followed by PCP; has been therapeutic -May consider checking hypercoaguable panel at 47months given history of clotting disorder in mother; will refer to heme for further management -Anticipate at least 59month course of warfarin-->heme referral as above -No DOAC for now due to morbid obesity with BMI 65 -Discussed how she cannot take HRT in the future; no tobacco use  #HTN: Well controlled at home with BP 120s/80s. -Continue amlodipine 10mg  daily and HCTZ 12.5mg  daily  #HLD: -Resume crestor 5mg  daily  #Morbid Obesity BMI 65: Discussed that weight loss will significantly decrease her CV risk. She was previously followed at weight loss clinic but has not followed up since the loss of her  mother in September, but is amenable to going again. -Follow-up in weight loss clinic  #Hypothyroidism: -Continue synthroid 29mcg    {Are you ordering a CV Procedure (e.g. stress test, cath, DCCV, TEE, etc)?   Press F2        :[id number]}    Medication Adjustments/Labs and Tests Ordered: Current medicines are reviewed at length with the patient today.  Concerns regarding medicines are outlined above.  No orders of the defined types were placed in this encounter.  No orders of the defined types were placed in this encounter.   There are no Patient Instructions on file for this visit.   Signed, Freada Bergeron,  MD  08/22/2020 7:33 PM    [place]

## 2020-08-23 NOTE — Telephone Encounter (Signed)
Transition Care Management Unsuccessful Follow-up Telephone Call  Date of discharge and from where:  08/18/2020 from [facility]  Attempts:  3rd Attempt  Reason for unsuccessful TCM follow-up call:  Unable to reach patient

## 2020-08-24 ENCOUNTER — Encounter: Payer: Self-pay | Admitting: Internal Medicine

## 2020-08-24 ENCOUNTER — Ambulatory Visit: Payer: Medicaid Other | Attending: Internal Medicine | Admitting: Internal Medicine

## 2020-08-24 ENCOUNTER — Other Ambulatory Visit: Payer: Self-pay

## 2020-08-24 ENCOUNTER — Ambulatory Visit: Payer: Medicaid Other | Admitting: Pharmacist

## 2020-08-24 ENCOUNTER — Ambulatory Visit: Payer: Medicaid Other | Admitting: Cardiology

## 2020-08-24 ENCOUNTER — Other Ambulatory Visit: Payer: Self-pay | Admitting: Pharmacist

## 2020-08-24 VITALS — BP 135/78 | HR 90 | Temp 98.5°F | Ht 61.0 in | Wt 369.4 lb

## 2020-08-24 DIAGNOSIS — J45909 Unspecified asthma, uncomplicated: Secondary | ICD-10-CM | POA: Diagnosis not present

## 2020-08-24 DIAGNOSIS — I10 Essential (primary) hypertension: Secondary | ICD-10-CM | POA: Diagnosis not present

## 2020-08-24 DIAGNOSIS — J4520 Mild intermittent asthma, uncomplicated: Secondary | ICD-10-CM

## 2020-08-24 DIAGNOSIS — J209 Acute bronchitis, unspecified: Secondary | ICD-10-CM | POA: Diagnosis not present

## 2020-08-24 DIAGNOSIS — E039 Hypothyroidism, unspecified: Secondary | ICD-10-CM | POA: Diagnosis not present

## 2020-08-24 DIAGNOSIS — Z86711 Personal history of pulmonary embolism: Secondary | ICD-10-CM | POA: Diagnosis not present

## 2020-08-24 DIAGNOSIS — N920 Excessive and frequent menstruation with regular cycle: Secondary | ICD-10-CM

## 2020-08-24 LAB — POCT INR: INR: 1.3 — AB (ref 2.0–3.0)

## 2020-08-24 MED ORDER — BENZONATATE 100 MG PO CAPS: 200.0000 mg | ORAL_CAPSULE | Freq: Two times a day (BID) | ORAL | 0 refills | Status: DC | PRN

## 2020-08-24 MED ORDER — BENZONATATE 200 MG PO CAPS: 200.0000 mg | ORAL_CAPSULE | Freq: Two times a day (BID) | ORAL | 0 refills | Status: DC | PRN

## 2020-08-24 MED ORDER — ALBUTEROL SULFATE HFA 108 (90 BASE) MCG/ACT IN AERS: 1.0000 | INHALATION_SPRAY | Freq: Four times a day (QID) | RESPIRATORY_TRACT | 3 refills | Status: DC | PRN

## 2020-08-24 MED ORDER — AZITHROMYCIN 250 MG PO TABS: ORAL_TABLET | ORAL | 0 refills | Status: DC

## 2020-08-24 NOTE — Patient Instructions (Addendum)
From [facility]: Make sure you continue your thyroid medication written by Dr. Wynetta Emery. If you cannot wake up by 6:00, don't worry. Take the medication within 30 minutes of waking up. This influences your INR.  Starting today, take 2 tablets every day except 1 tablet on Sundays, Tuesdays, and Thursdays. Return in 2 weeks for an INR recheck.

## 2020-08-24 NOTE — Progress Notes (Signed)
Patient ID: Angela [name], female    DOB: [birth year]  MRN: [id number]  CC: No chief complaint on file.   Subjective: Angela [name] is a 45 y.o. female who presents for 6 wks f/u Her concerns today include:  Hx of HTN, HL, morbid obesity, hypothyroid, unprovoked BL PE, homeless  Depression: She has had 2 telemedicine visits with therapist since last saw me. She has not find it too helpful.  She is trying to get in with another place called  [facility].  Still does not want med. -nothing has materialized as yet in finding stable housing.  She continues to work with the Education officer, museum on this.  Still living out of a hotel.  HTN:  Took BP meds already today and daily.  Blood pressure medications include hydrochlorothiazide, amlodipine and labetalol.  No device available to check BP.  Limits salt in her foods.  No chest pains.  Hypothyroid: TSH was slightly elevated when checked on last visit.  I advised that she restart the low-dose of the levothyroxine which she states she has been doing.    History of unprovoked PE: Taking Coumdin.  No bruising but reports heavy menses this mth.  Menses normally lasts 3 to 5 days with light bleeding.  She is currently on her menstrual cycle on day #5.  Twice this past week she had soaked through her pads.  She wears 3 super pads at a time.  She has noticed a few clots.      Seen in ER 08/18/2020 with upper respiratory symptoms.  Diagnosed with having URI.  Chest x-ray showed some bronchial thickening otherwise no consolidation in the lungs.   Reports that she continues to have cough productive of brown to green mucus.  Mild wheezing and shortness of breath.  Needs refill on albuterol inhaler.  She has not received Covid vaccine.  Denies any sick contacts.  Patient Active Problem List   Diagnosis Date Noted  . Major depressive disorder, recurrent episode, moderate ([place]) 05/16/2020  . AKI (acute kidney injury) ([place]) 12/31/2019  . Asthma 12/31/2019  .  Acquired hypothyroidism 12/31/2019  . Acute pulmonary embolism ([place]) 12/30/2019  . Essential hypertension 07/08/2018  . Morbid obesity ([place]) 07/08/2018  . Neuropathy of both feet 07/08/2018  . Primary osteoarthritis of both knees 04/02/2018  . Foot pain, right 04/02/2018  . 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 HTN (hypertension), benign 12/17/2016  . Pain, pelvic, female 02/05/2012  . Fibroids 02/05/2012  . Abnormal uterine bleeding 02/05/2012     Current Outpatient Medications on File Prior to Visit  Medication Sig Dispense Refill  . albuterol (PROVENTIL) (2.5 MG/3ML) 0.083% nebulizer solution Take 3 mLs (2.5 mg total) by nebulization every 6 (six) hours as needed for wheezing or shortness of breath. 75 mL 12  . amLODipine (NORVASC) 10 MG tablet Take 1 tablet (10 mg total) by mouth daily. 30 tablet 6  . hydrochlorothiazide (HYDRODIURIL) 12.5 MG tablet TAKE 1 TABLET(12.5 MG) BY MOUTH DAILY 30 tablet 6  . labetalol (NORMODYNE) 200 MG tablet TAKE 1 TABLET(200 MG) BY MOUTH TWICE DAILY 60 tablet 5  . warfarin (COUMADIN) 5 MG tablet Take as directed by the [facility] Clinic. 60 tablet 2  . levothyroxine (SYNTHROID) 25 MCG tablet Take 0.5 tablets (12.5 mcg total) by mouth daily at 6 (six) AM. (Patient not taking: Reported on 08/24/2020) 15 tablet 2  . rosuvastatin (CRESTOR) 5 MG tablet Take 1 tablet (5 mg total) by mouth daily. (Patient not taking: Reported  on 08/24/2020) 30 tablet 6  . Vitamin D, Ergocalciferol, (DRISDOL) 1.25 MG (50000 UNIT) CAPS capsule Take one tablet wkly (Patient not taking: No sig reported) 12 capsule 0   No current facility-administered medications on file prior to visit.    Allergies  Allergen Reactions  . Doxycycline Nausea And Vomiting  . Flagyl [Metronidazole Hcl] Hives and Nausea And Vomiting    Social History   Socioeconomic History  . Marital status: Single    Spouse name: Not on file  . Number of children: Not on file  . Years of education: Not on  file  . Highest education level: Not on file  Occupational History  . Occupation: Surveyor, quantity  Tobacco Use  . Smoking status: Never Smoker  . Smokeless tobacco: Never Used  Vaping Use  . Vaping Use: Never used  Substance and Sexual Activity  . Alcohol use: No  . Drug use: No  . Sexual activity: Yes    Birth control/protection: Condom, Surgical  Other Topics Concern  . Not on file  Social History Narrative  . Not on file   Social Determinants of Health   Financial Resource Strain: Not on file  Food Insecurity: Not on file  Transportation Needs: Not on file  Physical Activity: Not on file  Stress: Not on file  Social Connections: Not on file  Intimate Partner Violence: Not on file    Family History  Problem Relation Age of Onset  . Hyperlipidemia Mother   . Hypertension Mother   . Thyroid disease Mother   . Obesity Mother   . Clotting disorder Mother   . Cancer Son   . Hypertension Other   . Diabetes Other   . Other Neg Hx     Past Surgical History:  Procedure Laterality Date  . stye removal   1994  . TUBAL LIGATION  1998    ROS: Review of Systems Negative except as stated above  PHYSICAL EXAM: BP 135/78   Pulse 90   Temp 98.5 F (36.9 C)   Ht 5\' 1"  (1.549 m)   Wt (!) 369 lb 6.4 oz (167.6 kg)   SpO2 96%   BMI 69.80 kg/m   Physical Exam  General appearance - alert, well appearing, morbidly obese middle-age African-American female and in no distress Mental status - normal mood, behavior, speech, dress, motor activity, and thought processes Neck - supple, no significant adenopathy Chest -breath sounds difficult to hear due to large body habitus.  However no wheezing or crackles appreciated. Heart - normal rate, regular rhythm, normal S1, S2, no murmurs, rubs, clicks or gallops  Results for orders placed or performed in visit on 08/24/20  INR  Result Value Ref Range   INR 1.3 (A) 2.0 - 3.0    CMP Latest Ref Rng & Units 07/07/2020  03/21/2020 02/03/2020  Glucose 65 - 99 mg/dL 104(H) 93 97  BUN 6 - 24 mg/dL 17 7 17   Creatinine 0.57 - 1.00 mg/dL 1.20(H) 1.12(H) 1.18(H)  Sodium 134 - 144 mmol/L 143 139 145(H)  Potassium 3.5 - 5.2 mmol/L 4.2 3.2(L) 3.8  Chloride 96 - 106 mmol/L 104 101 103  CO2 20 - 29 mmol/L 27 27 28   Calcium 8.7 - 10.2 mg/dL 10.2 8.7(L) 9.8  Total Protein 6.5 - 8.1 g/dL - 8.1 -  Total Bilirubin 0.3 - 1.2 mg/dL - 0.5 -  Alkaline Phos 38 - 126 U/L - 65 -  AST 15 - 41 U/L - 26 -  ALT 0 -  44 U/L - 14 -   Lipid Panel     Component Value Date/Time   CHOL 180 11/25/2019 1254   TRIG 74 11/25/2019 1254   HDL 51 11/25/2019 1254   CHOLHDL 3.5 11/25/2019 1254   LDLCALC 115 (H) 11/25/2019 1254    CBC    Component Value Date/Time   WBC 4.3 03/21/2020 0139   RBC 4.61 03/21/2020 0139   HGB 12.3 03/21/2020 0139   HGB 12.4 02/03/2020 1536   HCT 40.2 03/21/2020 0139   HCT 38.7 02/03/2020 1536   PLT 274 03/21/2020 0139   PLT 317 02/03/2020 1536   MCV 87.2 03/21/2020 0139   MCV 85 02/03/2020 1536   MCH 26.7 03/21/2020 0139   MCHC 30.6 03/21/2020 0139   RDW 14.6 03/21/2020 0139   RDW 13.3 02/03/2020 1536   LYMPHSABS 1.6 03/21/2020 0139   LYMPHSABS 1.7 02/03/2020 1536   MONOABS 0.5 03/21/2020 0139   EOSABS 0.0 03/21/2020 0139   EOSABS 0.3 02/03/2020 1536   BASOSABS 0.0 03/21/2020 0139   BASOSABS 0.0 02/03/2020 1536    ASSESSMENT AND PLAN: 1. Essential hypertension Blood pressure close to goal.  Commended her on this and encouraged her to continue taking her medications.  2. Menorrhagia with regular cycle Likely due to the fact that she is on Coumadin.  Check CBC today.  If future cycles are also heavy or if she starts having breakthrough bleeding between cycles, we will check a pelvic ultrasound - CBC  3. Acquired hypothyroidism Continue low-dose levothyroxine  4. Bronchitis with asthma, acute Prescribe Zithromax and Tessalon Perles - azithromycin (ZITHROMAX Z-PAK) 250 MG tablet; 2 tabs PO  x 1 then 1 tab daily  Dispense: 6 each; Refill: 0  5. Mild intermittent asthma without complication - albuterol (VENTOLIN HFA) 108 (90 Base) MCG/ACT inhaler; Inhale 1-2 puffs into the lungs every 6 (six) hours as needed for wheezing or shortness of breath.  Dispense: 8 g; Refill: 3  6. Personal history of pulmonary embolism INR was checked by clinical pharmacist today and Coumadin level adjusted. - INR    Patient was given the opportunity to ask questions.  Patient verbalized understanding of the plan and was able to repeat key elements of the plan.   Orders Placed This Encounter  Procedures  . CBC  . INR     Requested Prescriptions   Signed Prescriptions Disp Refills  . albuterol (VENTOLIN HFA) 108 (90 Base) MCG/ACT inhaler 8 g 3    Sig: Inhale 1-2 puffs into the lungs every 6 (six) hours as needed for wheezing or shortness of breath.  Marland Kitchen azithromycin (ZITHROMAX Z-PAK) 250 MG tablet 6 each 0    Sig: 2 tabs PO x 1 then 1 tab daily    Return in about 3 months (around 11/24/2020).  Karle Plumber, MD, FACP

## 2020-08-25 ENCOUNTER — Other Ambulatory Visit: Payer: Self-pay | Admitting: Pharmacist

## 2020-08-25 LAB — CBC
Hematocrit: 38.5 % (ref 34.0–46.6)
Hemoglobin: 12.2 g/dL (ref 11.1–15.9)
MCH: 25.9 pg — ABNORMAL LOW (ref 26.6–33.0)
MCHC: 31.7 g/dL (ref 31.5–35.7)
MCV: 82 fL (ref 79–97)
Platelets: 390 10*3/uL (ref 150–450)
RBC: 4.71 x10E6/uL (ref 3.77–5.28)
RDW: 13.9 % (ref 11.7–15.4)
WBC: 7.7 10*3/uL (ref 3.4–10.8)

## 2020-08-25 MED ORDER — ALBUTEROL SULFATE HFA 108 (90 BASE) MCG/ACT IN AERS: 1.0000 | INHALATION_SPRAY | Freq: Four times a day (QID) | RESPIRATORY_TRACT | 2 refills | Status: DC | PRN

## 2020-08-25 MED FILL — BENZONATATE 100 MG CAPS: 100 | 10 days supply | Qty: 40 | Fill #0

## 2020-08-25 MED FILL — PROAIR HFA 90 MCG INHALER: 108 (90 BAS | 25 days supply | Qty: 9 | Fill #0

## 2020-08-25 MED FILL — AZITHROMYCIN 250 MG TABLET: 250 | 5 days supply | Qty: 6 | Fill #0

## 2020-09-01 DIAGNOSIS — Z419 Encounter for procedure for purposes other than remedying health state, unspecified: Secondary | ICD-10-CM | POA: Diagnosis not present

## 2020-09-07 ENCOUNTER — Other Ambulatory Visit: Payer: Self-pay

## 2020-09-07 ENCOUNTER — Ambulatory Visit: Payer: Medicaid Other | Attending: Internal Medicine | Admitting: Pharmacist

## 2020-09-07 DIAGNOSIS — I2699 Other pulmonary embolism without acute cor pulmonale: Secondary | ICD-10-CM

## 2020-09-07 LAB — POCT INR: INR: 1.2 — AB (ref 2.0–3.0)

## 2020-09-14 ENCOUNTER — Ambulatory Visit: Payer: Medicaid Other | Admitting: Pharmacist

## 2020-09-19 ENCOUNTER — Ambulatory Visit: Payer: Medicaid Other | Admitting: Pharmacist

## 2020-09-22 ENCOUNTER — Other Ambulatory Visit: Payer: Self-pay

## 2020-09-22 MED FILL — Albuterol Sulfate Inhal Aero 108 MCG/ACT (90MCG Base Equiv): RESPIRATORY_TRACT | 25 days supply | Qty: 8.5 | Fill #0 | Status: AC

## 2020-10-01 DIAGNOSIS — Z419 Encounter for procedure for purposes other than remedying health state, unspecified: Secondary | ICD-10-CM | POA: Diagnosis not present

## 2020-10-04 ENCOUNTER — Ambulatory Visit: Payer: Medicaid Other | Admitting: Pharmacist

## 2020-10-08 NOTE — Progress Notes (Deleted)
Cardiology Office Note:    Date:  10/08/2020   ID:  Angela, Vazguez 03/25/1976, MRN [id number]  PCP:  Ladell Pier, MD  [facility] Cardiologist:  Freada Bergeron, MD  [facility] Electrophysiologist:  None   Referring MD: Ladell Pier, MD     History of Present Illness:    Angela [name] is a 45 y.o. female with a hx of essential HTN, morbid obesity, recent unprovoked bilateral pulmonary emboli in 12/2019, asthma, and hypothyroidism who presents to clinic for follow-up.  Had ED visit on 03/21/20 where patient presented with severe left sided chest pain and hemoptysis. CTA chest showed no evidence of recurrent PE but possible bilateral pneumonia consistent with viral pneumonia. INR therapeutic at that time at 2.9. COVID negative. Discharged home on amoxicillin with plans for follow-up with PCP.  During last visit in 03/2020, she was having a very difficult time as she lost her mother to a stroke in September. She was  suffering from significant grief. She was stable from a CV standpoint.   Today,  Past Medical History:  Diagnosis Date  . Allergies   . Asthma   . Back pain   . Depression    hx, doing ok now  . Edema, lower extremity   . GERD (gastroesophageal reflux disease)   . Headache(784.0)   . Heart murmur   . Hypertension    on meds  . IBS (irritable bowel syndrome)   . Joint pain   . Major depressive disorder, recurrent episode, moderate ([place]) 05/16/2020  . Neuropathy   . Obesity   . Osteoarthritis   . Ovarian cyst   . Prediabetes   . Preterm labor   . Vitamin D deficiency     Past Surgical History:  Procedure Laterality Date  . stye removal   1994  . TUBAL LIGATION  1998    Current Medications: No outpatient medications have been marked as taking for the 10/11/20 encounter (Appointment) with Freada Bergeron, MD.     Allergies:   Doxycycline and Flagyl [metronidazole hcl]   Social History   Socioeconomic History  .  Marital status: Single    Spouse name: Not on file  . Number of children: Not on file  . Years of education: Not on file  . Highest education level: Not on file  Occupational History  . Occupation: Surveyor, quantity  Tobacco Use  . Smoking status: Never Smoker  . Smokeless tobacco: Never Used  Vaping Use  . Vaping Use: Never used  Substance and Sexual Activity  . Alcohol use: No  . Drug use: No  . Sexual activity: Yes    Birth control/protection: Condom, Surgical  Other Topics Concern  . Not on file  Social History Narrative  . Not on file   Social Determinants of Health   Financial Resource Strain: Not on file  Food Insecurity: Not on file  Transportation Needs: Not on file  Physical Activity: Not on file  Stress: Not on file  Social Connections: Not on file     Family History: The patient's family history includes Cancer in her son; Clotting disorder in her mother; Diabetes in an other family member; Hyperlipidemia in her mother; Hypertension in her mother and another family member; Obesity in her mother; Thyroid disease in her mother. There is no history of Other.  ROS:   Please see the history of present illness.    Review of Systems  Constitutional: Positive for malaise/fatigue. Negative for  chills and fever.  HENT: Negative for sore throat.   Eyes: Negative for blurred vision.  Respiratory: Positive for shortness of breath. Negative for cough.   Cardiovascular: Negative for chest pain, palpitations, orthopnea, claudication, leg swelling and PND.  Gastrointestinal: Negative for abdominal pain, blood in stool, melena, nausea and vomiting.  Genitourinary: Negative for hematuria.  Skin: Negative for rash.  Neurological: Negative for dizziness and loss of consciousness.  Endo/Heme/Allergies: Negative for polydipsia.  Psychiatric/Behavioral: Positive for depression.    EKGs/Labs/Other Studies Reviewed:    The following studies were reviewed  today: 12/31/19: Study Result    ECHOCARDIOGRAM REPORT       Patient Name:  Angela [name] Date of Exam: 12/31/2019  Medical Rec #: [id number]    Height:    61.0 in  Accession #:  [id number]    Weight:    371.0 lb  Date of Birth: 03/22/1976    BSA:     2.456 m  Patient Age:  69 years     BP:      146/113 mmHg  Patient Gender: F        HR:      90 bpm.  Exam Location: Inpatient   Procedure: 2D Echo   Indications:  Pulmonary Embolism    History:    Patient has no prior history of Echocardiogram  examinations.         Signs/Symptoms:Murmur; Risk Factors:Hypertension.    Sonographer:  Mikki Santee RDCS (AE)  Referring Phys: [id number] [place]    1. Left ventricular ejection fraction, by estimation, is 60 to 65%. The  left ventricle has normal function. The left ventricle has no regional  wall motion abnormalities. There is moderate left ventricular hypertrophy  and severe basal hypertrophy.  2. Right ventricular systolic function is normal. The right ventricular  size is normal. There is severely elevated pulmonary artery systolic  pressure. The estimated right ventricular systolic pressure is 57.3 mmHg.  3. The mitral valve is normal in structure. No evidence of mitral valve  regurgitation. No evidence of mitral stenosis.  4. Tricuspid valve regurgitation is mild to moderate.  5. The aortic valve is normal in structure. Aortic valve regurgitation is  not visualized. No aortic stenosis is present.  6. The inferior vena cava is dilated in size with >50% respiratory  variability, suggesting right atrial pressure of 8 mmHg.    12/31/19 LE doppler ultrasound: Summary:  RIGHT:  - There is no evidence of deep vein thrombosis in the lower extremity.  However, portions of this examination were limited- see technologist  comments above.    LEFT:  - There is no evidence  of deep vein thrombosis in the lower extremity.  However, portions of this examination were limited- see technologist  comments above.   Bilateral ABI 07/2018: Summary:  Right: Resting right ankle-brachial index is within normal range. No  evidence of significant right lower extremity arterial disease. The right  toe-brachial index is normal. RT great toe pressure = 185 mmHg.   Left: Resting left ankle-brachial index indicates noncompressible left  lower extremity arteries.The left toe-brachial index is normal. LT Great  toe pressure = 176 mmHg.   CTA 03/2020: FINDINGS: Cardiovascular: Mild cardiomegaly. No pericardial effusion. The thoracic aorta is unremarkable. Evaluation of the pulmonary arteries is limited due to respiratory motion artifact and suboptimal opacification and timing of the contrast. No large central pulmonary artery embolus identified. V/Q scan may provide additional information if there is high clinical  concern for acute PE.  Mediastinum/Nodes: There is no hilar or mediastinal adenopathy. The esophagus and the thyroid gland are grossly unremarkable. No mediastinal fluid collection.  Lungs/Pleura: Bilateral streaky and confluent airspace opacities most consistent with multifocal pneumonia, likely viral or atypical in etiology including COVID-19. Clinical correlation is recommended. No lobar consolidation, pleural effusion, pneumothorax. The central airways are patent.  Upper Abdomen: No acute abnormality.  Musculoskeletal: No chest wall abnormality. No acute or significant osseous findings.  Review of the MIP images confirms the above findings.  IMPRESSION: 1. No CT evidence of central pulmonary artery embolus. 2. Multifocal pneumonia, likely viral or atypical in etiology. Clinical correlation is recommended. 3. Mild cardiomegaly.  EKG:  EKG 03/21/20: NSR with poor r-wave progression. No ischemia. No block.  Recent Labs: 12/31/2019: B  Natriuretic Peptide 1,164.3 03/21/2020: ALT 14 07/07/2020: BUN 17; Creatinine, Ser 1.20; Potassium 4.2; Sodium 143; TSH 4.920 08/24/2020: Hemoglobin 12.2; Platelets 390  Recent Lipid Panel    Component Value Date/Time   CHOL 180 11/25/2019 1254   TRIG 74 11/25/2019 1254   HDL 51 11/25/2019 1254   CHOLHDL 3.5 11/25/2019 1254   LDLCALC 115 (H) 11/25/2019 1254      Physical Exam:    VS:  There were no vitals taken for this visit.    Wt Readings from Last 3 Encounters:  08/24/20 (!) 369 lb 6.4 oz (167.6 kg)  08/18/20 (!) 374 lb 12.5 oz (170 kg)  07/07/20 (!) 358 lb 9.6 oz (162.7 kg)     GEN: Comfortable, tearful on exam HEENT: Normal NECK: No JVD; No carotid bruits CARDIAC: RRR, no murmurs, rubs, gallops RESPIRATORY:  Clear to auscultation without rales, wheezing or rhonchi  ABDOMEN: Soft, non-tender, non-distended MUSCULOSKELETAL:  No edema; No deformity  SKIN: Warm and dry NEUROLOGIC:  Alert and oriented x 3 PSYCHIATRIC:  Tearful due to loss of her mother  ASSESSMENT:    No diagnosis found. PLAN:    In order of problems listed above:  #Unprovoked Pulmonary Embolism: Diagnosed on 12/30/19 when she presented to the ED with worsening SOB. No provoking factors (no recent long car/plane ride, period of immobility, surgery, illness, OCP/HRT use etc). CTA showed "Emboli involve both main pulmonary arteries extending into all lobar and many segmental and subsegmental branches. Straightening of the intraventricular septum with RV to LV ratio of 2.1. Minimal contrast refluxes into the hepatic veins and IVC." She was treated with IV heparin later transitioned to warfarin upon discharge.  -Continue warfarin 5mg  daily -INR followed by PCP; has been therapeutic -May consider checking hypercoaguable panel at 55months given history of clotting disorder in mother; will refer to heme for further management -Anticipate at least 49month course of warfarin-->heme referral as above -No DOAC for  now due to morbid obesity with BMI 65 -Discussed how she cannot take HRT in the future; no tobacco use  #HTN: Well controlled at home with BP 120s/80s. -Continue amlodipine 10mg  daily and HCTZ 12.5mg  daily  #HLD: -Continue crestor 5mg  daily  #Morbid Obesity BMI 65: Discussed that weight loss will significantly decrease her CV risk. She was previously followed at weight loss clinic but has not followed up since the loss of her mother in September, but is amenable to going again. -Follow-up in weight loss clinic  #Hypothyroidism: -Continue synthroid 52mcg  Medication Adjustments/Labs and Tests Ordered: Current medicines are reviewed at length with the patient today.  Concerns regarding medicines are outlined above.  No orders of the defined types were placed in this encounter.  No orders of the defined types were placed in this encounter.   There are no Patient Instructions on file for this visit.   Signed, Freada Bergeron, MD  10/08/2020 6:32 PM    [place]

## 2020-10-09 ENCOUNTER — Ambulatory Visit: Payer: Medicaid Other | Admitting: Pharmacist

## 2020-10-11 ENCOUNTER — Ambulatory Visit: Payer: Medicaid Other | Admitting: Cardiology

## 2020-10-12 ENCOUNTER — Ambulatory Visit: Payer: Medicaid Other | Attending: Internal Medicine | Admitting: Pharmacist

## 2020-10-12 ENCOUNTER — Other Ambulatory Visit: Payer: Self-pay

## 2020-10-12 DIAGNOSIS — I2699 Other pulmonary embolism without acute cor pulmonale: Secondary | ICD-10-CM | POA: Diagnosis not present

## 2020-10-12 LAB — POCT INR: INR: 4.4 — AB (ref 2.0–3.0)

## 2020-10-19 ENCOUNTER — Ambulatory Visit: Payer: Medicaid Other | Admitting: Pharmacist

## 2020-11-01 DIAGNOSIS — Z419 Encounter for procedure for purposes other than remedying health state, unspecified: Secondary | ICD-10-CM | POA: Diagnosis not present

## 2020-11-14 ENCOUNTER — Ambulatory Visit: Payer: Medicaid Other | Admitting: Pharmacist

## 2020-11-24 ENCOUNTER — Ambulatory Visit: Payer: Medicaid Other | Attending: Internal Medicine | Admitting: Internal Medicine

## 2020-11-24 ENCOUNTER — Other Ambulatory Visit: Payer: Self-pay

## 2020-11-24 ENCOUNTER — Ambulatory Visit (HOSPITAL_BASED_OUTPATIENT_CLINIC_OR_DEPARTMENT_OTHER): Payer: Medicaid Other | Admitting: Pharmacist

## 2020-11-24 ENCOUNTER — Encounter: Payer: Self-pay | Admitting: Internal Medicine

## 2020-11-24 DIAGNOSIS — I2699 Other pulmonary embolism without acute cor pulmonale: Secondary | ICD-10-CM

## 2020-11-24 DIAGNOSIS — G629 Polyneuropathy, unspecified: Secondary | ICD-10-CM | POA: Diagnosis not present

## 2020-11-24 DIAGNOSIS — I10 Essential (primary) hypertension: Secondary | ICD-10-CM | POA: Diagnosis not present

## 2020-11-24 DIAGNOSIS — R7303 Prediabetes: Secondary | ICD-10-CM | POA: Diagnosis not present

## 2020-11-24 DIAGNOSIS — M170 Bilateral primary osteoarthritis of knee: Secondary | ICD-10-CM

## 2020-11-24 LAB — POCT INR: INR: 1.2 — AB (ref 2.0–3.0)

## 2020-11-24 MED ORDER — PREGABALIN 25 MG PO CAPS: 25.0000 mg | ORAL_CAPSULE | Freq: Two times a day (BID) | ORAL | 1 refills | Status: DC

## 2020-11-24 MED ORDER — DICLOFENAC SODIUM 1 % EX GEL: 2.0000 g | Freq: Four times a day (QID) | CUTANEOUS | 3 refills | Status: DC

## 2020-11-24 NOTE — Progress Notes (Signed)
Virtual Visit via Telephone Note  I connected with Angela [name] on 11/24/2020 at 4:32 p.m by telephone and verified that I am speaking with the correct person using two identifiers  Location: Patient: our lobby. Provider: our parking lot  Participants: Myself Patient   I discussed the limitations, risks, security and privacy concerns of performing an evaluation and management service by telephone and the availability of in person appointments. I also discussed with the patient that there may be a patient responsible charge related to this service. The patient expressed understanding and agreed to proceed.   History of Present Illness: Hx of HTN, HL, morbid obesity, pre-DM, OA knees, hypothyroid, unprovoked BL PE, homeless.  Patient was last seen 08/2020.  Purpose of today's visit is chronic disease management. This was suppose to be an in person visit but had to be changed to a telephone visit due to [facility] staffing shortage.  C/o pain in knees RT>>LT.  She has know OA both knees.  Had seen Dr. Ninfa Linden in past and received steroid inj.  She last saw him about a yr ago.  No falls.  Knee pain is constant.  Taking Tylenol and Aleve with not much relief.  She was on meloxicam in the past but that was before she was started on Coumadin.  We had tried her with tramadol and Cymbalta also.  She did not like the way the medications made her feel.     Also pain in feet.  Burning and tingling in character.  She has history of prediabetes.  We had checked vitamin B12 about a year ago because of similar symptoms.  B12 level was normal.  -She tells me she is working on trying to get her weight down.  She has made some dietary changes.  She has cut back on portions and sodas.    HTN:  compliant with meds.  No device to check BP  Saw our clinical pharmacist today for INR check.  He tells me that her INR level was low.  She admits to skipping 2-3 doses of Coumadin recently.  She is still trying to secure  permanent housing.  She was living out of a motel, then with a friend.  Now with a family member.    Outpatient Encounter Medications as of 11/24/2020  Medication Sig Note   albuterol (PROVENTIL) (2.5 MG/3ML) 0.083% nebulizer solution Take 3 mLs (2.5 mg total) by nebulization every 6 (six) hours as needed for wheezing or shortness of breath.    albuterol (VENTOLIN HFA) 108 (90 Base) MCG/ACT inhaler INHALE 1-2 PUFFS INTO THE LUNGS EVERY 6 (SIX) HOURS AS NEEDED FOR WHEEZING OR SHORTNESS OF BREATH.    amLODipine (NORVASC) 10 MG tablet TAKE 1 TABLET (10 MG TOTAL) BY MOUTH DAILY.    azithromycin (ZITHROMAX) 250 MG tablet TAKE 2 TABLETS BY MOUTH ON DAY 1 THEN TAKE 1 TABLET DAILY FOR THE NEXT 4 DAYS    benzonatate (TESSALON) 100 MG capsule TAKE 2 CAPSULES (200 MG TOTAL) BY MOUTH 2 (TWO) TIMES DAILY AS NEEDED FOR COUGH.    hydrochlorothiazide (HYDRODIURIL) 12.5 MG tablet TAKE 1 TABLET(12.5 MG) BY MOUTH DAILY    hydrochlorothiazide (MICROZIDE) 12.5 MG capsule TAKE 1 TABLET(12.5 MG) BY MOUTH DAILY    labetalol (NORMODYNE) 200 MG tablet TAKE 1 TABLET(200 MG) BY MOUTH TWICE DAILY    levothyroxine (SYNTHROID) 25 MCG tablet TAKE 0.5 TABLETS (12.5 MCG TOTAL) BY MOUTH DAILY AT 6 (SIX) AM. (Patient not taking: Reported on 08/24/2020) 08/24/2020: Reports that she  cannot wake up by 0600 to take.    rosuvastatin (CRESTOR) 5 MG tablet TAKE 1 TABLET (5 MG TOTAL) BY MOUTH DAILY. (Patient not taking: Reported on 08/24/2020)    Vitamin D, Ergocalciferol, (DRISDOL) 1.25 MG (50000 UNIT) CAPS capsule TAKE ONE TABLET BY MOUTH ONCE WEEKLY (Patient not taking: No sig reported)    warfarin (COUMADIN) 5 MG tablet Take as directed by the Coumadin Clinic.    No facility-administered encounter medications on file as of 11/24/2020.      Observations/Objective: No direct observation done as this was a telephone visit.  Assessment and Plan: 1. Primary osteoarthritis of both knees Discussed the importance of much-needed weight loss.   She declines referral to nutritionist due to transportation issues at this time.  Dietary counseling given.  Advised to stop Aleve/Advil.  Continue Tylenol.  Voltaren gel also recommended.  We will get her back to orthopedics. - Ambulatory referral to Orthopedic Surgery - diclofenac Sodium (VOLTAREN) 1 % GEL; Apply 2 g topically 4 (four) times daily.  Dispense: 100 g; Refill: 3  2. Morbid obesity ([place]) See #1 above.  3. Essential hypertension Continue current medications and low-salt diet.  4. Peripheral polyneuropathy We will recheck A1c and B12 levels.  Start low-dose Lyrica. - pregabalin (LYRICA) 25 MG capsule; Take 1 capsule (25 mg total) by mouth 2 (two) times daily.  Dispense: 60 capsule; Refill: 1 - Hemoglobin A1c - Vitamin B12  5. Prediabetes Encourage healthy eating habits to prevent progression to diabetes. - Hemoglobin A1c   Follow Up Instructions: 4 mths   I discussed the assessment and treatment plan with the patient. The patient was provided an opportunity to ask questions and all were answered. The patient agreed with the plan and demonstrated an understanding of the instructions.   The patient was advised to call back or seek an in-person evaluation if the symptoms worsen or if the condition fails to improve as anticipated.  I  Spent 16 minutes on this telephone encounter  Karle Plumber, MD

## 2020-11-28 LAB — HEMOGLOBIN A1C
Est. average glucose Bld gHb Est-mCnc: 117 mg/dL
Hgb A1c MFr Bld: 5.7 % — ABNORMAL HIGH (ref 4.8–5.6)

## 2020-11-28 LAB — VITAMIN B12: Vitamin B-12: 689 pg/mL (ref 232–1245)

## 2020-11-28 NOTE — Progress Notes (Deleted)
Cardiology Office Note:    Date:  11/28/2020   ID:  [name], [name] 11/11/1975, MRN [id number]  PCP:  Ladell Pier, MD  [facility] Cardiologist:  Freada Bergeron, MD  [facility] Electrophysiologist:  None   Referring MD: Ladell Pier, MD     History of Present Illness:    Angela [name] is a 45 y.o. female with a hx of essential HTN, morbid obesity, recent unprovoked bilateral pulmonary emboli in 12/2019, asthma, and hypothyroidism who presents to clinic for follow-up.  Patient presented to [facility] ER on 03/21/20 with severe left sided chest pain and hemoptysis. CTA chest showed no evidence of recurrent PE but possible bilateral pneumonia consistent with viral pneumonia. INR therapeutic at that time at 2.9. COVID negative. Discharged home on amoxicillin with plans for follow-up with PCP.  Was last seen in clinic on 03/2020 where she was struggling with the loss of her mother. She was doing okay from a CV standpoint.  Today   Mother: Strokes x2; ? Clotting disorder, father unknown, older sister deceased from lung cancer. Grandmother with CAD.     Past Medical History:  Diagnosis Date   Allergies    Asthma    Back pain    Depression    hx, doing ok now   Edema, lower extremity    GERD (gastroesophageal reflux disease)    Headache(784.0)    Heart murmur    Hypertension    on meds   IBS (irritable bowel syndrome)    Joint pain    Major depressive disorder, recurrent episode, moderate (HCC) 05/16/2020   Neuropathy    Obesity    Osteoarthritis    Ovarian cyst    Prediabetes    Preterm labor    Vitamin D deficiency     Past Surgical History:  Procedure Laterality Date   stye removal   1994   TUBAL LIGATION  1998    Current Medications: No outpatient medications have been marked as taking for the 11/29/20 encounter (Appointment) with Freada Bergeron, MD.     Allergies:   Doxycycline and Flagyl [metronidazole hcl]   Social History    Socioeconomic History   Marital status: Single    Spouse name: Not on file   Number of children: Not on file   Years of education: Not on file   Highest education level: Not on file  Occupational History   Occupation: Licensed Cosmetologist  Tobacco Use   Smoking status: Never   Smokeless tobacco: Never  Vaping Use   Vaping Use: Never used  Substance and Sexual Activity   Alcohol use: No   Drug use: No   Sexual activity: Yes    Birth control/protection: Condom, Surgical  Other Topics Concern   Not on file  Social History Narrative   Not on file   Social Determinants of Health   Financial Resource Strain: Not on file  Food Insecurity: Not on file  Transportation Needs: Not on file  Physical Activity: Not on file  Stress: Not on file  Social Connections: Not on file     Family History: The patient's family history includes Cancer in her son; Clotting disorder in her mother; Diabetes in an other family member; Hyperlipidemia in her mother; Hypertension in her mother and another family member; Obesity in her mother; Thyroid disease in her mother. There is no history of Other.  ROS:   Please see the history of present illness.    Review of Systems  Constitutional:  Positive for malaise/fatigue. Negative for chills and fever.  HENT:  Negative for sore throat.   Eyes:  Negative for blurred vision.  Respiratory:  Positive for shortness of breath. Negative for cough.   Cardiovascular:  Negative for chest pain, palpitations, orthopnea, claudication, leg swelling and PND.  Gastrointestinal:  Negative for abdominal pain, blood in stool, melena, nausea and vomiting.  Genitourinary:  Negative for hematuria.  Skin:  Negative for rash.  Neurological:  Negative for dizziness and loss of consciousness.  Endo/Heme/Allergies:  Negative for polydipsia.  Psychiatric/Behavioral:  Positive for depression.    EKGs/Labs/Other Studies Reviewed:    The following studies were reviewed  today: 12/31/19: Study Result      ECHOCARDIOGRAM REPORT         Patient Name:   Angela [name] Date of Exam: 12/31/2019  Medical Rec #:  [id number]        Height:       61.0 in  Accession #:    [id number]       Weight:       371.0 lb  Date of Birth:  06-29-1975        BSA:          2.456 m  Patient Age:    17 years         BP:           146/113 mmHg  Patient Gender: F                HR:           90 bpm.  Exam Location:  Inpatient   Procedure: 2D Echo   Indications:    Pulmonary Embolism     History:        Patient has no prior history of Echocardiogram  examinations.                  Signs/Symptoms:Murmur; Risk Factors:Hypertension.     Sonographer:    Mikki Santee RDCS (AE)  Referring Phys: [id number] Isabela     1. Left ventricular ejection fraction, by estimation, is 60 to 65%. The  left ventricle has normal function. The left ventricle has no regional  wall motion abnormalities. There is moderate left ventricular hypertrophy  and severe basal hypertrophy.   2. Right ventricular systolic function is normal. The right ventricular  size is normal. There is severely elevated pulmonary artery systolic  pressure. The estimated right ventricular systolic pressure is 38.1 mmHg.   3. The mitral valve is normal in structure. No evidence of mitral valve  regurgitation. No evidence of mitral stenosis.   4. Tricuspid valve regurgitation is mild to moderate.   5. The aortic valve is normal in structure. Aortic valve regurgitation is  not visualized. No aortic stenosis is present.   6. The inferior vena cava is dilated in size with >50% respiratory  variability, suggesting right atrial pressure of 8 mmHg.    12/31/19 LE doppler ultrasound: Summary:  RIGHT:  - There is no evidence of deep vein thrombosis in the lower extremity.  However, portions of this examination were limited- see technologist  comments above.     LEFT:  - There is no evidence  of deep vein thrombosis in the lower extremity.  However, portions of this examination were limited- see technologist  comments above.   Bilateral ABI 07/2018: Summary:  Right: Resting right ankle-brachial index is within normal range. No  evidence of significant right  lower extremity arterial disease. The right  toe-brachial index is normal. RT great toe pressure = 185 mmHg.   Left: Resting left ankle-brachial index indicates noncompressible left  lower extremity arteries.The left toe-brachial index is normal. LT Great  toe pressure = 176 mmHg.   CTA 03/2020: FINDINGS: Cardiovascular: Mild cardiomegaly. No pericardial effusion. The thoracic aorta is unremarkable. Evaluation of the pulmonary arteries is limited due to respiratory motion artifact and suboptimal opacification and timing of the contrast. No large central pulmonary artery embolus identified. V/Q scan may provide additional information if there is high clinical concern for acute PE.   Mediastinum/Nodes: There is no hilar or mediastinal adenopathy. The esophagus and the thyroid gland are grossly unremarkable. No mediastinal fluid collection.   Lungs/Pleura: Bilateral streaky and confluent airspace opacities most consistent with multifocal pneumonia, likely viral or atypical in etiology including COVID-19. Clinical correlation is recommended. No lobar consolidation, pleural effusion, pneumothorax. The central airways are patent.   Upper Abdomen: No acute abnormality.   Musculoskeletal: No chest wall abnormality. No acute or significant osseous findings.   Review of the MIP images confirms the above findings.   IMPRESSION: 1. No CT evidence of central pulmonary artery embolus. 2. Multifocal pneumonia, likely viral or atypical in etiology. Clinical correlation is recommended. 3. Mild cardiomegaly.  EKG:  EKG 03/21/20: NSR with poor r-wave progression. No ischemia. No block.  Recent Labs: 12/31/2019: B  Natriuretic Peptide 1,164.3 03/21/2020: ALT 14 07/07/2020: BUN 17; Creatinine, Ser 1.20; Potassium 4.2; Sodium 143; TSH 4.920 08/24/2020: Hemoglobin 12.2; Platelets 390  Recent Lipid Panel    Component Value Date/Time   CHOL 180 11/25/2019 1254   TRIG 74 11/25/2019 1254   HDL 51 11/25/2019 1254   CHOLHDL 3.5 11/25/2019 1254   LDLCALC 115 (H) 11/25/2019 1254      Physical Exam:    VS:  There were no vitals taken for this visit.    Wt Readings from Last 3 Encounters:  08/24/20 (!) 369 lb 6.4 oz (167.6 kg)  08/18/20 (!) 374 lb 12.5 oz (170 kg)  07/07/20 (!) 358 lb 9.6 oz (162.7 kg)     GEN: Comfortable, tearful on exam HEENT: Normal NECK: No JVD; No carotid bruits CARDIAC: RRR, no murmurs, rubs, gallops RESPIRATORY:  Clear to auscultation without rales, wheezing or rhonchi  ABDOMEN: Soft, non-tender, non-distended MUSCULOSKELETAL:  No edema; No deformity  SKIN: Warm and dry NEUROLOGIC:  Alert and oriented x 3 PSYCHIATRIC:  Tearful due to loss of her mother  ASSESSMENT:    No diagnosis found.  PLAN:    In order of problems listed above:  #Unprovoked Pulmonary Embolism: Diagnosed on 12/30/19 when she presented to the ED with worsening SOB. No provoking factors (no recent long car/plane ride, period of immobility, surgery, illness, OCP/HRT use etc). CTA showed "Emboli involve both main pulmonary arteries extending into all lobar and many segmental and subsegmental branches. Straightening of the intraventricular septum with RV to LV ratio of 2.1. Minimal contrast refluxes into the hepatic veins and IVC." She was treated with IV heparin later transitioned to warfarin upon discharge.  -Continue warfarin 5mg  daily -INR followed by PCP; has been therapeutic -Recommended Heme referral but has not followed up -Anticipate at least 73month course of warfarin-->heme referral as above -No DOAC for now due to morbid obesity with BMI 65 -Discussed how she cannot take HRT in the future;  no tobacco use  #HTN: Well controlled at home with BP 120s/80s. -Continue amlodipine 10mg  daily and HCTZ 12.5mg  daily  #  HLD: -Continue crestor 5mg  daily  #Morbid Obesity BMI 65: Discussed that weight loss will significantly decrease her CV risk. She was previously followed at weight loss clinic. -Follow-up in weight loss clinic  #Hypothyroidism: -Continue synthroid 66mcg  Medication Adjustments/Labs and Tests Ordered: Current medicines are reviewed at length with the patient today.  Concerns regarding medicines are outlined above.  No orders of the defined types were placed in this encounter.  No orders of the defined types were placed in this encounter.   There are no Patient Instructions on file for this visit.    Signed, Freada Bergeron, MD  11/28/2020 4:35 PM    [facility]

## 2020-11-29 ENCOUNTER — Ambulatory Visit (HOSPITAL_BASED_OUTPATIENT_CLINIC_OR_DEPARTMENT_OTHER): Payer: Medicaid Other | Admitting: Cardiology

## 2020-12-01 ENCOUNTER — Ambulatory Visit: Payer: Medicaid Other | Admitting: Pharmacist

## 2020-12-01 DIAGNOSIS — Z419 Encounter for procedure for purposes other than remedying health state, unspecified: Secondary | ICD-10-CM | POA: Diagnosis not present

## 2020-12-07 ENCOUNTER — Ambulatory Visit: Payer: Medicaid Other | Admitting: Physician Assistant

## 2020-12-13 ENCOUNTER — Ambulatory Visit: Payer: Medicaid Other | Admitting: Physician Assistant

## 2020-12-20 ENCOUNTER — Ambulatory Visit: Payer: Medicaid Other | Admitting: Physician Assistant

## 2021-01-01 DIAGNOSIS — Z419 Encounter for procedure for purposes other than remedying health state, unspecified: Secondary | ICD-10-CM | POA: Diagnosis not present

## 2021-01-03 ENCOUNTER — Other Ambulatory Visit: Payer: Self-pay

## 2021-01-03 ENCOUNTER — Ambulatory Visit (INDEPENDENT_AMBULATORY_CARE_PROVIDER_SITE_OTHER): Payer: Medicaid Other | Admitting: Physician Assistant

## 2021-01-03 ENCOUNTER — Encounter: Payer: Self-pay | Admitting: Physician Assistant

## 2021-01-03 DIAGNOSIS — M1711 Unilateral primary osteoarthritis, right knee: Secondary | ICD-10-CM | POA: Diagnosis not present

## 2021-01-03 DIAGNOSIS — M170 Bilateral primary osteoarthritis of knee: Secondary | ICD-10-CM

## 2021-01-03 DIAGNOSIS — M1712 Unilateral primary osteoarthritis, left knee: Secondary | ICD-10-CM | POA: Diagnosis not present

## 2021-01-03 NOTE — Addendum Note (Signed)
Addended by: Robyne Peers on: 01/03/2021 04:50 PM   Modules accepted: Orders

## 2021-01-03 NOTE — Progress Notes (Addendum)
HPI: Angela [name] returns today complaining of bilateral knee pain.  She has known tricompartmental arthritis of both knees.  She received cortisone injections both knees by Dr. Ninfa [name] on 11/10/2019 she states that this gave her relief for only 1 week.  She is still grieving due to the loss of her mother a year ago.  She denies any new injuries.  Falls.  Feels like her knee pain is getting worse.  Most pains medial aspect of the knee.  She tried Voltaren gel with no relief.  She does take Tylenol without any real relief.  She has been seen in the weight loss clinic in the past and was losing weight due to the fact that her mother passed away she has not gone back.  Review of systems see HPI.  Physical exam:  Height 5 foot 1 weight 384 pounds BMI 72.67 General well-developed well-nourished female no acute distress ambulates without any assistive device.  Impression: Bilateral knee tricompartmental arthritis Obesity.  Plan encourage patient to do quad strengthening exercises she will start with disc simple leg lifts trying to perform 30 daily each leg.  Also encouraged her to get some type of cardiovascular exercise she may benefit from exercising in the pool.  We will refer her back to the weight loss clinic.  She knows to wait at least 3 months between injections however due to the fact that this only lasted a week feel that she would mostly benefit from weight loss in regards to her knee pain.  Questions were encouraged and answered at length.

## 2021-02-01 DIAGNOSIS — Z419 Encounter for procedure for purposes other than remedying health state, unspecified: Secondary | ICD-10-CM | POA: Diagnosis not present

## 2021-02-16 ENCOUNTER — Other Ambulatory Visit: Payer: Self-pay | Admitting: Internal Medicine

## 2021-02-16 ENCOUNTER — Other Ambulatory Visit: Payer: Self-pay

## 2021-02-16 MED ORDER — ALBUTEROL SULFATE (2.5 MG/3ML) 0.083% IN NEBU
2.5000 mg | INHALATION_SOLUTION | Freq: Four times a day (QID) | RESPIRATORY_TRACT | 1 refills | Status: DC | PRN
  Filled 2021-02-16 – 2021-06-19 (×3): qty 75, 7d supply, fill #0
  Filled 2021-06-19: qty 75, 7d supply, fill #1

## 2021-02-16 MED FILL — Albuterol Sulfate Inhal Aero 108 MCG/ACT (90MCG Base Equiv): RESPIRATORY_TRACT | 25 days supply | Qty: 8.5 | Fill #1 | Status: CN

## 2021-02-16 NOTE — Telephone Encounter (Signed)
Requested medication (s) are due for refill today:   Yes  Requested medication (s) are on the active medication list:   Yes  Future visit scheduled:   Yes   Last ordered: 01/06/2020 75 ml, 12 refills by a historical provider  Returned because last filled by a historical provider a yr ago  Requested Prescriptions  Pending Prescriptions Disp Refills   albuterol (PROVENTIL) (2.5 MG/3ML) 0.083% nebulizer solution 75 mL 12    Sig: Take 3 mLs (2.5 mg total) by nebulization every 6 (six) hours as needed for wheezing or shortness of breath.     Pulmonology:  Beta Agonists Failed - 02/16/2021 12:30 PM      Failed - One inhaler should last at least one month. If the patient is requesting refills earlier, contact the patient to check for uncontrolled symptoms.      Passed - Valid encounter within last 12 months    Recent Outpatient Visits           2 months ago Primary osteoarthritis of both [place], MD   5 months ago Essential hypertension   [place], MD   7 months ago Depression, major, single episode, moderate [facility])   [place], MD   1 year ago Bilateral pulmonary embolism [facility])   [place], [place]   1 year ago Mild intermittent asthma with exacerbation   [place], MD       Future Appointments             In 2 weeks Ladell Pier, MD [place]

## 2021-02-19 ENCOUNTER — Other Ambulatory Visit: Payer: Self-pay

## 2021-02-23 ENCOUNTER — Other Ambulatory Visit: Payer: Self-pay

## 2021-02-26 ENCOUNTER — Other Ambulatory Visit: Payer: Self-pay

## 2021-02-27 ENCOUNTER — Other Ambulatory Visit: Payer: Self-pay

## 2021-02-27 MED FILL — Albuterol Sulfate Inhal Aero 108 MCG/ACT (90MCG Base Equiv): RESPIRATORY_TRACT | 25 days supply | Qty: 8.5 | Fill #1 | Status: AC

## 2021-03-03 DIAGNOSIS — Z419 Encounter for procedure for purposes other than remedying health state, unspecified: Secondary | ICD-10-CM | POA: Diagnosis not present

## 2021-03-05 ENCOUNTER — Encounter: Payer: Self-pay | Admitting: Internal Medicine

## 2021-03-05 ENCOUNTER — Ambulatory Visit: Payer: Medicaid Other | Attending: Internal Medicine | Admitting: Internal Medicine

## 2021-03-05 ENCOUNTER — Other Ambulatory Visit: Payer: Self-pay

## 2021-03-05 VITALS — BP 130/90 | HR 88 | Resp 16 | Wt 388.8 lb

## 2021-03-05 DIAGNOSIS — I10 Essential (primary) hypertension: Secondary | ICD-10-CM | POA: Diagnosis not present

## 2021-03-05 DIAGNOSIS — Z79899 Other long term (current) drug therapy: Secondary | ICD-10-CM | POA: Diagnosis not present

## 2021-03-05 DIAGNOSIS — N3946 Mixed incontinence: Secondary | ICD-10-CM | POA: Diagnosis not present

## 2021-03-05 DIAGNOSIS — Z91128 Patient's intentional underdosing of medication regimen for other reason: Secondary | ICD-10-CM | POA: Diagnosis not present

## 2021-03-05 DIAGNOSIS — T45516A Underdosing of anticoagulants, initial encounter: Secondary | ICD-10-CM | POA: Insufficient documentation

## 2021-03-05 DIAGNOSIS — E039 Hypothyroidism, unspecified: Secondary | ICD-10-CM | POA: Insufficient documentation

## 2021-03-05 DIAGNOSIS — T381X6A Underdosing of thyroid hormones and substitutes, initial encounter: Secondary | ICD-10-CM | POA: Diagnosis not present

## 2021-03-05 DIAGNOSIS — Z86018 Personal history of other benign neoplasm: Secondary | ICD-10-CM | POA: Diagnosis not present

## 2021-03-05 DIAGNOSIS — Z5901 Sheltered homelessness: Secondary | ICD-10-CM | POA: Insufficient documentation

## 2021-03-05 DIAGNOSIS — E559 Vitamin D deficiency, unspecified: Secondary | ICD-10-CM | POA: Diagnosis not present

## 2021-03-05 DIAGNOSIS — Z91138 Patient's unintentional underdosing of medication regimen for other reason: Secondary | ICD-10-CM | POA: Insufficient documentation

## 2021-03-05 DIAGNOSIS — Z5900 Homelessness unspecified: Secondary | ICD-10-CM

## 2021-03-05 DIAGNOSIS — Z01419 Encounter for gynecological examination (general) (routine) without abnormal findings: Secondary | ICD-10-CM

## 2021-03-05 DIAGNOSIS — R7303 Prediabetes: Secondary | ICD-10-CM | POA: Diagnosis not present

## 2021-03-05 DIAGNOSIS — R32 Unspecified urinary incontinence: Secondary | ICD-10-CM | POA: Diagnosis not present

## 2021-03-05 DIAGNOSIS — M170 Bilateral primary osteoarthritis of knee: Secondary | ICD-10-CM | POA: Insufficient documentation

## 2021-03-05 DIAGNOSIS — Z5982 Transportation insecurity: Secondary | ICD-10-CM | POA: Diagnosis not present

## 2021-03-05 DIAGNOSIS — Z6841 Body Mass Index (BMI) 40.0 and over, adult: Secondary | ICD-10-CM | POA: Diagnosis not present

## 2021-03-05 DIAGNOSIS — Z86711 Personal history of pulmonary embolism: Secondary | ICD-10-CM | POA: Insufficient documentation

## 2021-03-05 LAB — POCT INR: INR: 1 — AB (ref 2.0–3.0)

## 2021-03-05 MED ORDER — AMLODIPINE BESYLATE 10 MG PO TABS
ORAL_TABLET | Freq: Every day | ORAL | 6 refills | Status: DC
Start: 2021-03-05 — End: 2022-01-07

## 2021-03-05 MED ORDER — APIXABAN (ELIQUIS) VTE STARTER PACK (10MG AND 5MG): ORAL_TABLET | ORAL | 0 refills | Status: DC

## 2021-03-05 MED ORDER — SOLIFENACIN SUCCINATE 5 MG PO TABS: 5.0000 mg | ORAL_TABLET | Freq: Every day | ORAL | 4 refills | Status: DC

## 2021-03-05 MED ORDER — HYDROCHLOROTHIAZIDE 12.5 MG PO CAPS: ORAL_CAPSULE | ORAL | 6 refills | Status: DC

## 2021-03-05 MED ORDER — LABETALOL HCL 200 MG PO TABS: ORAL_TABLET | ORAL | 5 refills | Status: DC

## 2021-03-05 NOTE — Patient Instructions (Signed)
Start Vesicare to help with bladder incontinence.

## 2021-03-05 NOTE — Progress Notes (Signed)
Patient ID: CHALISE PE, female    DOB: 1975-07-25  MRN: [id number]  CC: Hypertension   Subjective: Angela [name] is a 45 y.o. female who presents for chronic ds management Her concerns today include:  Hx of HTN, HL, morbid obesity, pre-DM, OA knees, hypothyroid, unprovoked BL PE, homeless.  Last eval 11/2020  Housing:  back living in [place] with her 8 grandkids.  Waiting to hear back from housing authority as she has been approved for an apartment.   OA knees:  saw Dr. Trevor Mace PA 01/2021 Referred to [facility].  Patient has not received a call from them as yet. Has problems with transportation.  Filled out a form the last time she was here but has not heard anything  HTN: compliant with meds but out of Norvasc for several days.  Thyroid:  not taking Levothyroxine because she feels it messes up her Coumadin level.  Stopped the med a few mths ago Would like Vit D level recheck.  Was on high dose vit D in past but has been out  Hx PE:  taking Coumadin but missed a few last wk. she was seen by our clinical pharmacist after she saw me today.  She told him that she has not been taking the medicine consistently at all.  C/o bladder leakage x 1 mth. Leakage with coughing and laughing.  She would like to try a medication to see if it would help with the incontinence.  Reports the incontinence is worse at nights.  Overdue for Pap smear.  Has hx of fibroid.  Menses last 5 days.  Last mth she was having a lot of cramps with her menses.  Menses was light then heavy for a few days last mth  HM: due for colon CA screening and PAP.  She declines colon cancer screening. Patient Active Problem List   Diagnosis Date Noted   Major depressive disorder, recurrent episode, moderate ([place]) 05/16/2020   AKI (acute kidney injury) ([place]) 12/31/2019   Asthma 12/31/2019   Acquired hypothyroidism 12/31/2019   Acute pulmonary embolism ([place]) 12/30/2019   Essential hypertension 07/08/2018    Morbid obesity ([place]) 07/08/2018   Neuropathy of both feet 07/08/2018   Primary osteoarthritis of both knees 04/02/2018   Foot pain, right 04/02/2018   Chronic maxillary sinusitis 09/18/2017   HTN (hypertension), benign 12/17/2016   Pain, pelvic, female 02/05/2012   Fibroids 02/05/2012   Abnormal uterine bleeding 02/05/2012     Current Outpatient Medications on File Prior to Visit  Medication Sig Dispense Refill   albuterol (PROVENTIL) (2.5 MG/3ML) 0.083% nebulizer solution Take 3 mLs (2.5 mg total) by nebulization every 6 (six) hours as needed for wheezing or shortness of breath. 75 mL 1   albuterol (VENTOLIN HFA) 108 (90 Base) MCG/ACT inhaler INHALE 1-2 PUFFS INTO THE LUNGS EVERY 6 (SIX) HOURS AS NEEDED FOR WHEEZING OR SHORTNESS OF BREATH. 8.5 g 2   amLODipine (NORVASC) 10 MG tablet TAKE 1 TABLET (10 MG TOTAL) BY MOUTH DAILY. 30 tablet 6   azithromycin (ZITHROMAX) 250 MG tablet TAKE 2 TABLETS BY MOUTH ON DAY 1 THEN TAKE 1 TABLET DAILY FOR THE NEXT 4 DAYS (Patient not taking: Reported on 03/05/2021) 6 tablet 0   benzonatate (TESSALON) 100 MG capsule TAKE 2 CAPSULES (200 MG TOTAL) BY MOUTH 2 (TWO) TIMES DAILY AS NEEDED FOR COUGH. (Patient not taking: Reported on 03/05/2021) 40 capsule 0   diclofenac Sodium (VOLTAREN) 1 % GEL Apply 2 g topically 4 (four) times  daily. 100 g 3   hydrochlorothiazide (HYDRODIURIL) 12.5 MG tablet TAKE 1 TABLET(12.5 MG) BY MOUTH DAILY 30 tablet 6   hydrochlorothiazide (MICROZIDE) 12.5 MG capsule TAKE 1 TABLET(12.5 MG) BY MOUTH DAILY 30 capsule 6   labetalol (NORMODYNE) 200 MG tablet TAKE 1 TABLET(200 MG) BY MOUTH TWICE DAILY 60 tablet 5   levothyroxine (SYNTHROID) 25 MCG tablet TAKE 0.5 TABLETS (12.5 MCG TOTAL) BY MOUTH DAILY AT 6 (SIX) AM. (Patient not taking: No sig reported) 15 tablet 2   pregabalin (LYRICA) 25 MG capsule Take 1 capsule (25 mg total) by mouth 2 (two) times daily. 60 capsule 1   rosuvastatin (CRESTOR) 5 MG tablet TAKE 1 TABLET (5 MG TOTAL) BY MOUTH  DAILY. (Patient not taking: No sig reported) 30 tablet 6   Vitamin D, Ergocalciferol, (DRISDOL) 1.25 MG (50000 UNIT) CAPS capsule TAKE ONE TABLET BY MOUTH ONCE WEEKLY (Patient not taking: No sig reported) 12 capsule 0   warfarin (COUMADIN) 5 MG tablet Take as directed by the [facility]. 60 tablet 2   No current facility-administered medications on file prior to visit.    Allergies  Allergen Reactions   Doxycycline Nausea And Vomiting   Flagyl [Metronidazole Hcl] Hives and Nausea And Vomiting    Social History   Socioeconomic History   Marital status: Single    Spouse name: Not on file   Number of children: Not on file   Years of education: Not on file   Highest education level: Not on file  Occupational History   Occupation: Licensed Cosmetologist  Tobacco Use   Smoking status: Never   Smokeless tobacco: Never  Vaping Use   Vaping Use: Never used  Substance and Sexual Activity   Alcohol use: No   Drug use: No   Sexual activity: Yes    Birth control/protection: Condom, Surgical  Other Topics Concern   Not on file  Social History Narrative   Not on file   Social Determinants of Health   Financial Resource Strain: Not on file  Food Insecurity: Not on file  Transportation Needs: Not on file  Physical Activity: Not on file  Stress: Not on file  Social Connections: Not on file  Intimate Partner Violence: Not on file    Family History  Problem Relation Age of Onset   Hyperlipidemia Mother    Hypertension Mother    Thyroid disease Mother    Obesity Mother    Clotting disorder Mother    Cancer Son    Hypertension Other    Diabetes Other    Other Neg Hx     Past Surgical History:  Procedure Laterality Date   stye removal   1994   TUBAL LIGATION  1998    ROS: Review of Systems Negative except as stated above  PHYSICAL EXAM: BP 130/90   Pulse 88   Resp 16   Wt (!) 388 lb 12.8 oz (176.4 kg)   SpO2 95%   BMI 73.46 kg/m   Wt Readings from Last 3  Encounters:  03/05/21 (!) 388 lb 12.8 oz (176.4 kg)  01/03/21 (!) 384 lb 6.4 oz (174.4 kg)  08/24/20 (!) 369 lb 6.4 oz (167.6 kg)    Physical Exam  General appearance - alert, well appearing, morbidly obese African-American female and in no distress Mental status - normal mood, behavior, speech, dress, motor activity, and thought processes Neck - supple, no significant adenopathy Chest - clear to auscultation, no wheezes, rales or rhonchi, symmetric air entry Heart - normal  rate, regular rhythm, normal S1, S2, no murmurs, rubs, clicks or gallops Extremities - peripheral pulses normal, no pedal edema, no clubbing or cyanosis   CMP Latest Ref Rng & Units 07/07/2020 03/21/2020 02/03/2020  Glucose 65 - 99 mg/dL 104(H) 93 97  BUN 6 - 24 mg/dL 17 7 17   Creatinine 0.57 - 1.00 mg/dL 1.20(H) 1.12(H) 1.18(H)  Sodium 134 - 144 mmol/L 143 139 145(H)  Potassium 3.5 - 5.2 mmol/L 4.2 3.2(L) 3.8  Chloride 96 - 106 mmol/L 104 101 103  CO2 20 - 29 mmol/L 27 27 28   Calcium 8.7 - 10.2 mg/dL 10.2 8.7(L) 9.8  Total Protein 6.5 - 8.1 g/dL - 8.1 -  Total Bilirubin 0.3 - 1.2 mg/dL - 0.5 -  Alkaline Phos 38 - 126 U/L - 65 -  AST 15 - 41 U/L - 26 -  ALT 0 - 44 U/L - 14 -   Lipid Panel     Component Value Date/Time   CHOL 180 11/25/2019 1254   TRIG 74 11/25/2019 1254   HDL 51 11/25/2019 1254   CHOLHDL 3.5 11/25/2019 1254   LDLCALC 115 (H) 11/25/2019 1254    CBC    Component Value Date/Time   WBC 7.7 08/24/2020 1650   WBC 4.3 03/21/2020 0139   RBC 4.71 08/24/2020 1650   RBC 4.61 03/21/2020 0139   HGB 12.2 08/24/2020 1650   HCT 38.5 08/24/2020 1650   PLT 390 08/24/2020 1650   MCV 82 08/24/2020 1650   MCH 25.9 (L) 08/24/2020 1650   MCH 26.7 03/21/2020 0139   MCHC 31.7 08/24/2020 1650   MCHC 30.6 03/21/2020 0139   RDW 13.9 08/24/2020 1650   LYMPHSABS 1.6 03/21/2020 0139   LYMPHSABS 1.7 02/03/2020 1536   MONOABS 0.5 03/21/2020 0139   EOSABS 0.0 03/21/2020 0139   EOSABS 0.3 02/03/2020 1536    BASOSABS 0.0 03/21/2020 0139   BASOSABS 0.0 02/03/2020 1536    ASSESSMENT AND PLAN: 1. Essential hypertension Close to goal.  Out Norvasc.  RF sent on all meds - hydrochlorothiazide (MICROZIDE) 12.5 MG capsule; TAKE 1 TABLET(12.5 MG) BY MOUTH DAILY  Dispense: 30 capsule; Refill: 6 - amLODipine (NORVASC) 10 MG tablet; TAKE 1 TABLET (10 MG TOTAL) BY MOUTH DAILY.  Dispense: 30 tablet; Refill: 6 - labetalol (NORMODYNE) 200 MG tablet; TAKE 1 TABLET(200 MG) BY MOUTH TWICE DAILY  Dispense: 60 tablet; Refill: 5 - Basic Metabolic Panel  2. Morbid obesity ([place]) She is agreeable to me resubmitting referral to [facility].  She had started the program but quit after her mother died.  She would like to get back into the program.  3. Primary osteoarthritis of both knees Stressed the importance of weight loss  4. Hypothyroidism, unspecified type I will recheck a thyroid level to see whether she needs to be back on the levothyroxine.  Advised patient that if she does need to get back on it she should take the medication first thing in the morning all by itself on at least 2 hours apart from her other medications. - TSH  5. Mixed stress and urge urinary incontinence Kegel's exercises discussed and encouraged.  She is willing to try Vesicare.  Prescription given for incontinence pads. - solifenacin (VESICARE) 5 MG tablet; Take 1 tablet (5 mg total) by mouth daily. For urine incontience  Dispense: 30 tablet; Refill: 4 - For home use only DME Other see comment  6. History of pulmonary embolism INR was not at goal.  I discussed with the  clinical pharmacist about changing her to a Echo given transportation issues of getting here to have INR level checked.  I think this is a reasonable option even though there is some questions about the effectiveness of the labs in patients with BMI greater than 40. We both agreed that this would be a reasonable option and patient is agreeable to being placed on a DOAC.   If compliance remains an issue then we will just stop the DOAC also.  Patient advised and warned about potential for bleeding.  I went over with her how to take the Eliquis starter pack. Stop Coumadin - INR  7. History of uterine fibroid - CBC  8. Encounter for cervical Pap smear with pelvic exam Given body habitus, I will send her to gynecology to get her Pap smear done. - Ambulatory referral to Gynecology  9. Vitamin D deficiency - VITAMIN D 25 Hydroxy (Vit-D Deficiency, Fractures)  10. Homeless    Patient was given the opportunity to ask questions.  Patient verbalized understanding of the plan and was able to repeat key elements of the plan.   No orders of the defined types were placed in this encounter.    Requested Prescriptions    No prescriptions requested or ordered in this encounter    No follow-ups on file.  Karle Plumber, MD, FACP

## 2021-03-06 LAB — CBC
Hematocrit: 38.3 % (ref 34.0–46.6)
Hemoglobin: 12.3 g/dL (ref 11.1–15.9)
MCH: 25.3 pg — ABNORMAL LOW (ref 26.6–33.0)
MCHC: 32.1 g/dL (ref 31.5–35.7)
MCV: 79 fL (ref 79–97)
Platelets: 385 10*3/uL (ref 150–450)
RBC: 4.87 x10E6/uL (ref 3.77–5.28)
RDW: 14.3 % (ref 11.7–15.4)
WBC: 7 10*3/uL (ref 3.4–10.8)

## 2021-03-06 LAB — VITAMIN D 25 HYDROXY (VIT D DEFICIENCY, FRACTURES): Vit D, 25-Hydroxy: 8.8 ng/mL — ABNORMAL LOW (ref 30.0–100.0)

## 2021-03-06 LAB — TSH: TSH: 8.1 u[IU]/mL — ABNORMAL HIGH (ref 0.450–4.500)

## 2021-03-06 LAB — BASIC METABOLIC PANEL
BUN/Creatinine Ratio: 13 (ref 9–23)
BUN: 14 mg/dL (ref 6–24)
CO2: 23 mmol/L (ref 20–29)
Calcium: 9.7 mg/dL (ref 8.7–10.2)
Chloride: 100 mmol/L (ref 96–106)
Creatinine, Ser: 1.08 mg/dL — ABNORMAL HIGH (ref 0.57–1.00)
Glucose: 117 mg/dL — ABNORMAL HIGH (ref 70–99)
Potassium: 4.3 mmol/L (ref 3.5–5.2)
Sodium: 137 mmol/L (ref 134–144)
eGFR: 64 mL/min/{1.73_m2} (ref >59)

## 2021-03-07 ENCOUNTER — Other Ambulatory Visit: Payer: Self-pay | Admitting: Internal Medicine

## 2021-03-07 DIAGNOSIS — E559 Vitamin D deficiency, unspecified: Secondary | ICD-10-CM

## 2021-03-07 DIAGNOSIS — E039 Hypothyroidism, unspecified: Secondary | ICD-10-CM

## 2021-03-07 MED ORDER — VITAMIN D (ERGOCALCIFEROL) 1.25 MG (50000 UNIT) PO CAPS: 50000.0000 [IU] | ORAL_CAPSULE | ORAL | 1 refills | Status: DC

## 2021-03-07 MED ORDER — LEVOTHYROXINE SODIUM 25 MCG PO TABS: ORAL_TABLET | ORAL | 4 refills | Status: DC

## 2021-03-26 ENCOUNTER — Other Ambulatory Visit: Payer: Self-pay

## 2021-03-26 ENCOUNTER — Encounter: Payer: Self-pay | Admitting: Internal Medicine

## 2021-03-29 ENCOUNTER — Ambulatory Visit: Payer: Medicaid Other | Attending: Internal Medicine | Admitting: Internal Medicine

## 2021-03-29 ENCOUNTER — Other Ambulatory Visit: Payer: Self-pay

## 2021-03-29 DIAGNOSIS — J4520 Mild intermittent asthma, uncomplicated: Secondary | ICD-10-CM

## 2021-03-29 MED ORDER — PREDNISONE 20 MG PO TABS: ORAL_TABLET | ORAL | 0 refills | Status: DC

## 2021-03-29 MED ORDER — BENZONATATE 100 MG PO CAPS: 100.0000 mg | ORAL_CAPSULE | Freq: Two times a day (BID) | ORAL | 0 refills | Status: DC | PRN

## 2021-03-29 MED ORDER — AZITHROMYCIN 250 MG PO TABS: ORAL_TABLET | ORAL | 0 refills | Status: DC

## 2021-03-29 NOTE — Progress Notes (Signed)
Virtual Visit via Telephone Note Restarted this as a video visit but there was audio difficulty where the patient was able to hear me but I could not hear her so it was converted to a telephone visit.  I connected with Angela [name] on 03/29/2021 at 11:58 AM by telephone and verified that I am speaking with the correct person using two identifiers  Location: Patient: home Provider: office  Participants: Myself Patient   I discussed the limitations, risks, security and privacy concerns of performing an evaluation and management service by telephone and the availability of in person appointments. I also discussed with the patient that there may be a patient responsible charge related to this service. The patient expressed understanding and agreed to proceed.   History of Present Illness: Hx of HTN, HL, morbid obesity, pre-DM, OA knees, hypothyroid, unprovoked BL PE, homeless. This is an UC visit.  Pt c/o upper resp symptoms x 5 days C/o cough productive of thick yellow/brown mucus.  Leaks urine when she coughs Little SOB and  a lot of wheezing where she is using to use the Albuterol inhaler more.  No longer has a nebulizer; it was misplaced when she had to move out of her deceased mother's house.  Not much congestion.  + mild hoarseness.  No sore throat or fever.  No recent sick contacts.  She has not done a COVID test.  She has not COVID or flu vaccine and is afraid to get them.    Outpatient Encounter Medications as of 03/29/2021  Medication Sig   albuterol (PROVENTIL) (2.5 MG/3ML) 0.083% nebulizer solution Take 3 mLs (2.5 mg total) by nebulization every 6 (six) hours as needed for wheezing or shortness of breath.   albuterol (VENTOLIN HFA) 108 (90 Base) MCG/ACT inhaler INHALE 1-2 PUFFS INTO THE LUNGS EVERY 6 (SIX) HOURS AS NEEDED FOR WHEEZING OR SHORTNESS OF BREATH.   amLODipine (NORVASC) 10 MG tablet TAKE 1 TABLET (10 MG TOTAL) BY MOUTH DAILY.   APIXABAN (ELIQUIS) VTE STARTER PACK  (10MG  AND 5MG ) Take as directed on package: start with two-5mg  tablets twice daily for 7 days. On day 8, switch to one-5mg  tablet twice daily.   diclofenac Sodium (VOLTAREN) 1 % GEL Apply 2 g topically 4 (four) times daily.   hydrochlorothiazide (MICROZIDE) 12.5 MG capsule TAKE 1 TABLET(12.5 MG) BY MOUTH DAILY   labetalol (NORMODYNE) 200 MG tablet TAKE 1 TABLET(200 MG) BY MOUTH TWICE DAILY   levothyroxine (SYNTHROID) 25 MCG tablet TAKE 0.5 TABLETS (12.5 MCG TOTAL) BY MOUTH DAILY AT 6 (SIX) AM.   pregabalin (LYRICA) 25 MG capsule Take 1 capsule (25 mg total) by mouth 2 (two) times daily.   rosuvastatin (CRESTOR) 5 MG tablet TAKE 1 TABLET (5 MG TOTAL) BY MOUTH DAILY. (Patient not taking: No sig reported)   solifenacin (VESICARE) 5 MG tablet Take 1 tablet (5 mg total) by mouth daily. For urine incontience   Vitamin D, Ergocalciferol, (DRISDOL) 1.25 MG (50000 UNIT) CAPS capsule Take 1 capsule (50,000 Units total) by mouth once a week.     Observations/Objective: No direct observation done as this was a telephone visit.  Patient was able to talk in full sentences.  She did not sound too congested.  Assessment and Plan: 1. Mild intermittent asthmatic bronchitis without complication Patient to continue using the albuterol inhaler.  We will place her on a Z-Pak along with a short prednisone taper.  Advised patient to get COVID testing done.  Encouraged her to consider getting the COVID-19  vaccine series and the flu shot. - azithromycin (ZITHROMAX Z-PAK) 250 MG tablet; 2 tabs PO x 1 then 1 tab PO daily  Dispense: 6 each; Refill: 0 - predniSONE (DELTASONE) 20 MG tablet; 1 tab PO daily x 3 days then 1/2 tab daily x 2 days  Dispense: 4 tablet; Refill: 0 - benzonatate (TESSALON) 100 MG capsule; Take 1 capsule (100 mg total) by mouth 2 (two) times daily as needed for cough.  Dispense: 30 capsule; Refill: 0   Follow Up Instructions: PRN   I discussed the assessment and treatment plan with the patient. The  patient was provided an opportunity to ask questions and all were answered. The patient agreed with the plan and demonstrated an understanding of the instructions.   The patient was advised to call back or seek an in-person evaluation if the symptoms worsen or if the condition fails to improve as anticipated.  I  Spent 10 minutes on this telephone encounter  Karle Plumber, MD

## 2021-03-30 ENCOUNTER — Other Ambulatory Visit: Payer: Self-pay

## 2021-04-03 DIAGNOSIS — Z419 Encounter for procedure for purposes other than remedying health state, unspecified: Secondary | ICD-10-CM | POA: Diagnosis not present

## 2021-04-05 DIAGNOSIS — R32 Unspecified urinary incontinence: Secondary | ICD-10-CM | POA: Diagnosis not present

## 2021-04-06 ENCOUNTER — Encounter: Payer: Medicaid Other | Admitting: Obstetrics and Gynecology

## 2021-04-11 ENCOUNTER — Encounter: Payer: Self-pay | Admitting: Internal Medicine

## 2021-04-18 ENCOUNTER — Encounter: Payer: Medicaid Other | Admitting: Obstetrics and Gynecology

## 2021-04-19 ENCOUNTER — Encounter: Payer: Medicaid Other | Admitting: Obstetrics and Gynecology

## 2021-05-03 ENCOUNTER — Encounter: Payer: Self-pay | Admitting: Internal Medicine

## 2021-05-03 DIAGNOSIS — Z86711 Personal history of pulmonary embolism: Secondary | ICD-10-CM

## 2021-05-03 DIAGNOSIS — R32 Unspecified urinary incontinence: Secondary | ICD-10-CM | POA: Diagnosis not present

## 2021-05-03 DIAGNOSIS — Z419 Encounter for procedure for purposes other than remedying health state, unspecified: Secondary | ICD-10-CM | POA: Diagnosis not present

## 2021-05-03 MED ORDER — APIXABAN 5 MG PO TABS: 5.0000 mg | ORAL_TABLET | Freq: Two times a day (BID) | ORAL | 3 refills | Status: DC

## 2021-05-07 ENCOUNTER — Encounter: Payer: Self-pay | Admitting: Internal Medicine

## 2021-05-24 ENCOUNTER — Other Ambulatory Visit: Payer: Self-pay | Admitting: Internal Medicine

## 2021-05-24 DIAGNOSIS — E559 Vitamin D deficiency, unspecified: Secondary | ICD-10-CM

## 2021-06-03 DIAGNOSIS — Z419 Encounter for procedure for purposes other than remedying health state, unspecified: Secondary | ICD-10-CM | POA: Diagnosis not present

## 2021-06-03 DIAGNOSIS — R32 Unspecified urinary incontinence: Secondary | ICD-10-CM | POA: Diagnosis not present

## 2021-06-19 ENCOUNTER — Other Ambulatory Visit: Payer: Self-pay

## 2021-06-19 ENCOUNTER — Other Ambulatory Visit: Payer: Self-pay | Admitting: Internal Medicine

## 2021-06-19 NOTE — Telephone Encounter (Signed)
Requested medication (s) are due for refill today: yes  Requested medication (s) are on the active medication list: yes    Last refill: 02/16/21  75  1 refill  Future visit scheduled yes 07/09/21  Notes to clinic: Per pharmacy:  Please review. Thank you Pharmacy comment: patient doesnt have enough to fill a full prescription. Can you send in a new rx for 90 ml please    Pulmonology:  Beta       Requested Prescriptions  Pending Prescriptions Disp Refills   albuterol (PROVENTIL) (2.5 MG/3ML) 0.083% nebulizer solution 75 mL 1    Sig: Take 3 mLs (2.5 mg total) by nebulization every 6 (six) hours as needed for wheezing or shortness of breath.     Pulmonology:  Beta Agonists Failed - 06/19/2021  9:30 AM      Failed - One inhaler should last at least one month. If the patient is requesting refills earlier, contact the patient to check for uncontrolled symptoms.      Passed - Valid encounter within last 12 months    Recent Outpatient Visits           2 months ago Mild intermittent asthmatic bronchitis without complication   [place] Ladell Pier, MD   3 months ago Essential hypertension   [place], MD   6 months ago Primary osteoarthritis of both [place], MD   9 months ago Essential hypertension   [place], MD   11 months ago Depression, major, single episode, moderate [facility])   [place], MD       Future Appointments             In 2 weeks Ladell Pier, MD [place]

## 2021-06-20 ENCOUNTER — Other Ambulatory Visit: Payer: Self-pay

## 2021-06-20 MED ORDER — ALBUTEROL SULFATE (2.5 MG/3ML) 0.083% IN NEBU
2.5000 mg | INHALATION_SOLUTION | Freq: Four times a day (QID) | RESPIRATORY_TRACT | 1 refills | Status: DC | PRN
  Filled 2021-06-20: qty 75, 7d supply, fill #0

## 2021-06-22 ENCOUNTER — Other Ambulatory Visit: Payer: Self-pay

## 2021-07-04 DIAGNOSIS — Z419 Encounter for procedure for purposes other than remedying health state, unspecified: Secondary | ICD-10-CM | POA: Diagnosis not present

## 2021-07-05 ENCOUNTER — Encounter: Payer: Self-pay | Admitting: Internal Medicine

## 2021-07-06 DIAGNOSIS — R32 Unspecified urinary incontinence: Secondary | ICD-10-CM | POA: Diagnosis not present

## 2021-07-09 ENCOUNTER — Ambulatory Visit: Payer: Medicaid Other | Attending: Internal Medicine | Admitting: Internal Medicine

## 2021-07-09 ENCOUNTER — Encounter: Payer: Self-pay | Admitting: Internal Medicine

## 2021-07-09 VITALS — BP 141/102 | HR 110 | Resp 16 | Wt 389.6 lb

## 2021-07-09 DIAGNOSIS — J4520 Mild intermittent asthma, uncomplicated: Secondary | ICD-10-CM

## 2021-07-09 DIAGNOSIS — L853 Xerosis cutis: Secondary | ICD-10-CM | POA: Diagnosis not present

## 2021-07-09 DIAGNOSIS — I10 Essential (primary) hypertension: Secondary | ICD-10-CM | POA: Diagnosis not present

## 2021-07-09 DIAGNOSIS — E039 Hypothyroidism, unspecified: Secondary | ICD-10-CM

## 2021-07-09 DIAGNOSIS — J40 Bronchitis, not specified as acute or chronic: Secondary | ICD-10-CM | POA: Diagnosis not present

## 2021-07-09 DIAGNOSIS — E785 Hyperlipidemia, unspecified: Secondary | ICD-10-CM

## 2021-07-09 DIAGNOSIS — E559 Vitamin D deficiency, unspecified: Secondary | ICD-10-CM | POA: Diagnosis not present

## 2021-07-09 DIAGNOSIS — Z86711 Personal history of pulmonary embolism: Secondary | ICD-10-CM | POA: Diagnosis not present

## 2021-07-09 MED ORDER — BENZONATATE 100 MG PO CAPS: 100.0000 mg | ORAL_CAPSULE | Freq: Two times a day (BID) | ORAL | 0 refills | Status: DC | PRN

## 2021-07-09 MED ORDER — ALBUTEROL SULFATE HFA 108 (90 BASE) MCG/ACT IN AERS: 1.0000 | INHALATION_SPRAY | Freq: Four times a day (QID) | RESPIRATORY_TRACT | 6 refills | Status: DC | PRN

## 2021-07-09 MED ORDER — AZITHROMYCIN 250 MG PO TABS: ORAL_TABLET | ORAL | 0 refills | Status: DC

## 2021-07-09 MED ORDER — PREDNISONE 20 MG PO TABS: ORAL_TABLET | ORAL | 0 refills | Status: DC

## 2021-07-09 MED ORDER — LABETALOL HCL 300 MG PO TABS: 300.0000 mg | ORAL_TABLET | Freq: Two times a day (BID) | ORAL | 5 refills | Status: DC

## 2021-07-09 MED ORDER — TRIAMCINOLONE ACETONIDE 0.1 % EX CREA
1.0000 | TOPICAL_CREAM | Freq: Two times a day (BID) | CUTANEOUS | 0 refills | Status: DC
Start: 2021-07-09 — End: 2021-10-02

## 2021-07-09 NOTE — Progress Notes (Addendum)
kin   Patient ID: Angela [name], female    DOB: 1975-11-21  MRN: [id number]  CC: Asthma, Hypertension, and Ear Pain (Right ear/Pain scale 7 our of 10 )   Subjective: Angela [name] is a 46 y.o. female who presents for chronic ds management Her concerns today include:  Hx of HTN, HL, morbid obesity, pre-DM, OA knees, hypothyroid, unprovoked BL PE, homeless.   Patient presents today complaining of cough productive of thick phlegm x4 days.  Phlegm has ranged in color from brown to yellowish-green.  A few times she noticed a little blood mixed in with it.  No fever or sore throat.  Right ear has been hurting some.  Some shortness of breath.  She has been using her nebulizer every 4-6 hours.  She has been out of her albuterol inhaler for about 1 month.  Grandson recently had a cold.  History of PE: On last visit we changed her from Coumadin to Eliquis.  She reports that she has been more compliant with taking the Eliquis but missed taking it for the past few days because she had not been feeling well with her respiratory symptoms.  Denies any bruising or bleeding.  However she reports that her menstrual cycles have been heavier since being on Eliquis.  It was very heavy for the first month after starting the Eliquis.     HTN: Blood pressure noted to be elevated today.  Reports compliance with medications and took them already today.  Medications are HCTZ, labetalol 200 mg twice a day, amlodipine 10 mg daily     Hypothyroid:  taking levothyroxine consistently except for past few days  HL: She has not been taking Crestor.  She states that when she went to fill it they did not have it at the drugstore.    Found to have vitamin D deficiency on last visit with a level of 8.  Reports compliance with taking high-dose vitamin D.  Complains of dry painful cracked skin over both elbows.  She now has her own apartment.  However she does not like the environment because of safety issues and because her  neighbors are very nosy Patient Active Problem List   Diagnosis Date Noted   Major depressive disorder, recurrent episode, moderate ([place]) 05/16/2020   AKI (acute kidney injury) ([place]) 12/31/2019   Asthma 12/31/2019   Acquired hypothyroidism 12/31/2019   Acute pulmonary embolism ([place]) 12/30/2019   Essential hypertension 07/08/2018   Morbid obesity ([place]) 07/08/2018   Neuropathy of both feet 07/08/2018   Primary osteoarthritis of both knees 04/02/2018   Foot pain, right 04/02/2018   Chronic maxillary sinusitis 09/18/2017   HTN (hypertension), benign 12/17/2016   Pain, pelvic, female 02/05/2012   Fibroids 02/05/2012   Abnormal uterine bleeding 02/05/2012     Current Outpatient Medications on File Prior to Visit  Medication Sig Dispense Refill   albuterol (PROVENTIL) (2.5 MG/3ML) 0.083% nebulizer solution Take 3 mLs (2.5 mg total) by nebulization every 6 (six) hours as needed for wheezing or shortness of breath. 75 mL 1   amLODipine (NORVASC) 10 MG tablet TAKE 1 TABLET (10 MG TOTAL) BY MOUTH DAILY. 30 tablet 6   apixaban (ELIQUIS) 5 MG TABS tablet Take 1 tablet (5 mg total) by mouth 2 (two) times daily. 60 tablet 3   diclofenac Sodium (VOLTAREN) 1 % GEL Apply 2 g topically 4 (four) times daily. 100 g 3   hydrochlorothiazide (MICROZIDE) 12.5 MG capsule TAKE 1 TABLET(12.5 MG) BY MOUTH DAILY 30 capsule 6  levothyroxine (SYNTHROID) 25 MCG tablet TAKE 0.5 TABLETS (12.5 MCG TOTAL) BY MOUTH DAILY AT 6 (SIX) AM. 15 tablet 4   predniSONE (DELTASONE) 20 MG tablet 1 tab PO daily x 3 days then 1/2 tab daily x 2 days 4 tablet 0   pregabalin (LYRICA) 25 MG capsule Take 1 capsule (25 mg total) by mouth 2 (two) times daily. 60 capsule 1   rosuvastatin (CRESTOR) 5 MG tablet TAKE 1 TABLET (5 MG TOTAL) BY MOUTH DAILY. (Patient not taking: No sig reported) 30 tablet 6   solifenacin (VESICARE) 5 MG tablet Take 1 tablet (5 mg total) by mouth daily. For urine incontience 30 tablet 4   Vitamin D, Ergocalciferol,  (DRISDOL) 1.25 MG (50000 UNIT) CAPS capsule Take 1 capsule (50,000 Units total) by mouth once a week. 12 capsule 1   No current facility-administered medications on file prior to visit.    Allergies  Allergen Reactions   Doxycycline Nausea And Vomiting   Flagyl [Metronidazole Hcl] Hives and Nausea And Vomiting    Social History   Socioeconomic History   Marital status: Single    Spouse name: Not on file   Number of children: Not on file   Years of education: Not on file   Highest education level: Not on file  Occupational History   Occupation: Licensed Cosmetologist  Tobacco Use   Smoking status: Never   Smokeless tobacco: Never  Vaping Use   Vaping Use: Never used  Substance and Sexual Activity   Alcohol use: No   Drug use: No   Sexual activity: Yes    Birth control/protection: Condom, Surgical  Other Topics Concern   Not on file  Social History Narrative   Not on file   Social Determinants of Health   Financial Resource Strain: Not on file  Food Insecurity: Not on file  Transportation Needs: Not on file  Physical Activity: Not on file  Stress: Not on file  Social Connections: Not on file  Intimate Partner Violence: Not on file    Family History  Problem Relation Age of Onset   Hyperlipidemia Mother    Hypertension Mother    Thyroid disease Mother    Obesity Mother    Clotting disorder Mother    Cancer Son    Hypertension Other    Diabetes Other    Other Neg Hx     Past Surgical History:  Procedure Laterality Date   stye removal   1994   TUBAL LIGATION  1998    ROS: Review of Systems Negative except as stated above  PHYSICAL EXAM: BP (!) 141/102    Pulse (!) 110    Resp 16    Wt (!) 389 lb 9.6 oz (176.7 kg)    SpO2 92%    BMI 73.61 kg/m   Physical Exam  General appearance - alert, well appearing, and in no distress Mental status - normal mood, behavior, speech, dress, motor activity, and thought processes Mouth - mucous membranes moist,  pharynx normal without lesions Neck - supple, no significant adenopathy Chest -breath sounds are decreased bilaterally but no crackles or wheezes heard. Heart -regular on auscultation. Skin: Dried hyperpigmented skin over both elbows.  CMP Latest Ref Rng & Units 03/05/2021 07/07/2020 03/21/2020  Glucose 70 - 99 mg/dL 117(H) 104(H) 93  BUN 6 - 24 mg/dL 14 17 7   Creatinine 0.57 - 1.00 mg/dL 1.08(H) 1.20(H) 1.12(H)  Sodium 134 - 144 mmol/L 137 143 139  Potassium 3.5 - 5.2 mmol/L 4.3 4.2  3.2(L)  Chloride 96 - 106 mmol/L 100 104 101  CO2 20 - 29 mmol/L 23 27 27   Calcium 8.7 - 10.2 mg/dL 9.7 10.2 8.7(L)  Total Protein 6.5 - 8.1 g/dL - - 8.1  Total Bilirubin 0.3 - 1.2 mg/dL - - 0.5  Alkaline Phos 38 - 126 U/L - - 65  AST 15 - 41 U/L - - 26  ALT 0 - 44 U/L - - 14   Lipid Panel     Component Value Date/Time   CHOL 180 11/25/2019 1254   TRIG 74 11/25/2019 1254   HDL 51 11/25/2019 1254   CHOLHDL 3.5 11/25/2019 1254   LDLCALC 115 (H) 11/25/2019 1254    CBC    Component Value Date/Time   WBC 7.0 03/05/2021 1615   WBC 4.3 03/21/2020 0139   RBC 4.87 03/05/2021 1615   RBC 4.61 03/21/2020 0139   HGB 12.3 03/05/2021 1615   HCT 38.3 03/05/2021 1615   PLT 385 03/05/2021 1615   MCV 79 03/05/2021 1615   MCH 25.3 (L) 03/05/2021 1615   MCH 26.7 03/21/2020 0139   MCHC 32.1 03/05/2021 1615   MCHC 30.6 03/21/2020 0139   RDW 14.3 03/05/2021 1615   LYMPHSABS 1.6 03/21/2020 0139   LYMPHSABS 1.7 02/03/2020 1536   MONOABS 0.5 03/21/2020 0139   EOSABS 0.0 03/21/2020 0139   EOSABS 0.3 02/03/2020 1536   BASOSABS 0.0 03/21/2020 0139   BASOSABS 0.0 02/03/2020 1536    ASSESSMENT AND PLAN: 1. Bronchitis We will give her a course of Zithromax.  Refill sent on albuterol inhaler.  Short course of prednisone - albuterol (VENTOLIN HFA) 108 (90 Base) MCG/ACT inhaler; Inhale 1-2 puffs into the lungs every 6 (six) hours as needed for wheezing or shortness of breath.  Dispense: 8.5 g; Refill: 6 -  azithromycin (ZITHROMAX Z-PAK) 250 MG tablet; 2 tabs PO x 1 then 1 tab PO daily  Dispense: 6 each; Refill: 0 - benzonatate (TESSALON) 100 MG capsule; Take 1 capsule (100 mg total) by mouth 2 (two) times daily as needed for cough.  Dispense: 30 capsule; Refill: 0 - predniSONE (DELTASONE) 20 MG tablet; 1 tab PO daily x 3 days then 1/2 tab daily x 2 days  Dispense: 4 tablet; Refill: 0  2. Essential hypertension Not at goal.  Recommend increasing labetalol to 300 mg twice a day.  Follow-up with clinical pharmacist in several weeks for blood pressure recheck. - CBC - labetalol (NORMODYNE) 300 MG tablet; Take 1 tablet (300 mg total) by mouth 2 (two) times daily.  Dispense: 60 tablet; Refill: 5  3. History of pulmonary embolism Continue Eliquis.  We will recheck CBC today.  4. Hypothyroidism, unspecified type Encouraged her to take the levothyroxine daily as prescribed.  5. Vitamin D deficiency We will recheck vitamin D level - VITAMIN D 25 Hydroxy (Vit-D Deficiency, Fractures)  6. Hyperlipidemia, unspecified hyperlipidemia type Advised to try her pharmacy again to see whether the Crestor has came in - Lipid panel - Hepatic Function Panel  7. Dry skin - triamcinolone cream (KENALOG) 0.1 %; Apply 1 application topically 2 (two) times daily.  Dispense: 30 g; Refill: 0      Patient was given the opportunity to ask questions.  Patient verbalized understanding of the plan and was able to repeat key elements of the plan.   Orders Placed This Encounter  Procedures   CBC   Lipid panel   Hepatic Function Panel   VITAMIN D 25 Hydroxy (Vit-D Deficiency, Fractures)  Requested Prescriptions   Signed Prescriptions Disp Refills   albuterol (VENTOLIN HFA) 108 (90 Base) MCG/ACT inhaler 8.5 g 6    Sig: Inhale 1-2 puffs into the lungs every 6 (six) hours as needed for wheezing or shortness of breath.   azithromycin (ZITHROMAX Z-PAK) 250 MG tablet 6 each 0    Sig: 2 tabs PO x 1 then 1 tab PO  daily   benzonatate (TESSALON) 100 MG capsule 30 capsule 0    Sig: Take 1 capsule (100 mg total) by mouth 2 (two) times daily as needed for cough.   triamcinolone cream (KENALOG) 0.1 % 30 g 0    Sig: Apply 1 application topically 2 (two) times daily.   labetalol (NORMODYNE) 300 MG tablet 60 tablet 5    Sig: Take 1 tablet (300 mg total) by mouth 2 (two) times daily.    Return in about 4 months (around 11/06/2021).  Karle Plumber, MD, FACP

## 2021-07-10 ENCOUNTER — Other Ambulatory Visit: Payer: Self-pay | Admitting: Internal Medicine

## 2021-07-10 DIAGNOSIS — E782 Mixed hyperlipidemia: Secondary | ICD-10-CM

## 2021-07-10 LAB — HEPATIC FUNCTION PANEL
ALT: 8 IU/L (ref 0–32)
AST: 8 IU/L (ref 0–40)
Albumin: 4 g/dL (ref 3.8–4.8)
Alkaline Phosphatase: 92 IU/L (ref 44–121)
Bilirubin Total: <0.2 mg/dL (ref 0.0–1.2)
Bilirubin, Direct: <0.1 mg/dL (ref 0.00–0.40)
Total Protein: 7.5 g/dL (ref 6.0–8.5)

## 2021-07-10 LAB — LIPID PANEL
Chol/HDL Ratio: 4.3 ratio (ref 0.0–4.4)
Cholesterol, Total: 175 mg/dL (ref 100–199)
HDL: 41 mg/dL (ref >39)
LDL Chol Calc (NIH): 115 mg/dL — ABNORMAL HIGH (ref 0–99)
Triglycerides: 104 mg/dL (ref 0–149)
VLDL Cholesterol Cal: 19 mg/dL (ref 5–40)

## 2021-07-10 LAB — CBC
Hematocrit: 38.1 % (ref 34.0–46.6)
Hemoglobin: 11.8 g/dL (ref 11.1–15.9)
MCH: 24.1 pg — ABNORMAL LOW (ref 26.6–33.0)
MCHC: 31 g/dL — ABNORMAL LOW (ref 31.5–35.7)
MCV: 78 fL — ABNORMAL LOW (ref 79–97)
Platelets: 409 10*3/uL (ref 150–450)
RBC: 4.89 x10E6/uL (ref 3.77–5.28)
RDW: 14.1 % (ref 11.7–15.4)
WBC: 9.1 10*3/uL (ref 3.4–10.8)

## 2021-07-10 LAB — VITAMIN D 25 HYDROXY (VIT D DEFICIENCY, FRACTURES): Vit D, 25-Hydroxy: 31.9 ng/mL (ref 30.0–100.0)

## 2021-07-10 MED ORDER — ROSUVASTATIN CALCIUM 5 MG PO TABS
ORAL_TABLET | Freq: Every day | ORAL | 6 refills | Status: DC
  Filled 2021-07-10: qty 30, fill #0
  Filled 2021-07-11: qty 30, 30d supply, fill #0

## 2021-07-10 NOTE — Telephone Encounter (Signed)
Requested Prescriptions  Pending Prescriptions Disp Refills   rosuvastatin (CRESTOR) 5 MG tablet 30 tablet 6    Sig: TAKE 1 TABLET (5 MG TOTAL) BY MOUTH DAILY.     Cardiovascular:  Antilipid - Statins 2 Failed - 07/10/2021  1:17 PM      Failed - Cr in normal range and within 360 days    Creatinine  Date Value Ref Range Status  12/30/2017 359.6 (H) 20.0 - 300.0 mg/dL Final   Creat  Date Value Ref Range Status  08/06/2016 1.12 (H) 0.50 - 1.10 mg/dL Final   Creatinine, Ser  Date Value Ref Range Status  03/05/2021 1.08 (H) 0.57 - 1.00 mg/dL Final         Failed - Lipid Panel in normal range within the last 12 months    Cholesterol, Total  Date Value Ref Range Status  07/09/2021 175 100 - 199 mg/dL Final   LDL Chol Calc (NIH)  Date Value Ref Range Status  07/09/2021 115 (H) 0 - 99 mg/dL Final   HDL  Date Value Ref Range Status  07/09/2021 41 >39 mg/dL Final   Triglycerides  Date Value Ref Range Status  07/09/2021 104 0 - 149 mg/dL Final         Passed - Patient is not pregnant      Passed - Valid encounter within last 12 months    Recent Outpatient Visits          [place], Deborah B, MD   3 months ago Mild intermittent asthmatic bronchitis without complication   [place], Deborah B, MD   4 months ago Essential hypertension   [place], Deborah B, MD   7 months ago Primary osteoarthritis of both knees   [place], Deborah B, MD   10 months ago Essential hypertension   [place], Deborah B, MD      Future Appointments            In 3 months Wynetta Emery, Dalbert Batman, MD [place]

## 2021-07-11 ENCOUNTER — Other Ambulatory Visit: Payer: Self-pay

## 2021-07-23 ENCOUNTER — Ambulatory Visit: Payer: Self-pay

## 2021-07-23 ENCOUNTER — Other Ambulatory Visit: Payer: Self-pay

## 2021-07-23 DIAGNOSIS — R058 Other specified cough: Secondary | ICD-10-CM

## 2021-07-23 NOTE — Telephone Encounter (Signed)
Will forward to provider  

## 2021-07-23 NOTE — Addendum Note (Signed)
Addended by: Karle Plumber B on: 07/23/2021 05:23 PM   Modules accepted: Orders

## 2021-07-23 NOTE — Telephone Encounter (Signed)
Pts sister called in stating the pt is still not feeling better from the visit she just had, pt is currently coughing up thick mucus, pt requested a call back, please advise     Chief Complaint: Productive cough Symptoms: Yellow and brown thick mucus. Ears feel full. Wheezing. "None of the medicine helped. I think I need something stronger." Frequency: 1 month ago. Seen in office 07/09/21 Pertinent Negatives: Patient denies fever Disposition: [] ED /[] Urgent Care (no appt availability in office) / [] Appointment(In office/virtual)/ []  [facility]/ [] Home Care/ [] Refused Recommended Disposition /[] [place] [facility]/ []  Follow-up with PCP Additional Notes: Please advise pt.  Answer Assessment - Initial Assessment Questions 1. ONSET: "When did the cough begin?"      1 month ago 2. SEVERITY: "How bad is the cough today?"      Severe 3. SPUTUM: "Describe the color of your sputum" (none, dry cough; clear, white, yellow, green)     Brown, yellow 4. HEMOPTYSIS: "Are you coughing up any blood?" If so ask: "How much?" (flecks, streaks, tablespoons, etc.)     No 5. DIFFICULTY BREATHING: "Are you having difficulty breathing?" If Yes, ask: "How bad is it?" (e.g., mild, moderate, severe)    - MILD: No SOB at rest, mild SOB with walking, speaks normally in sentences, can lie down, no retractions, pulse < 100.    - MODERATE: SOB at rest, SOB with minimal exertion and prefers to sit, cannot lie down flat, speaks in phrases, mild retractions, audible wheezing, pulse 100-120.    - SEVERE: Very SOB at rest, speaks in single words, struggling to breathe, sitting hunched forward, retractions, pulse > 120      Mild 6. FEVER: "Do you have a fever?" If Yes, ask: "What is your temperature, how was it measured, and when did it start?"     No 7. CARDIAC HISTORY: "Do you have any history of heart disease?" (e.g., heart attack, congestive heart failure)      No 8. LUNG HISTORY: "Do you have any history  of lung disease?"  (e.g., pulmonary embolus, asthma, emphysema)     No 9. PE RISK FACTORS: "Do you have a history of blood clots?" (or: recent major surgery, recent prolonged travel, bedridden)     No 10. OTHER SYMPTOMS: "Do you have any other symptoms?" (e.g., runny nose, wheezing, chest pain)       Asthma, wheezing 11. PREGNANCY: "Is there any chance you are pregnant?" "When was your last menstrual period?"       No 12. TRAVEL: "Have you traveled out of the country in the last month?" (e.g., travel history, exposures)       No  Protocols used: Cough - Acute Productive-A-AH

## 2021-07-24 ENCOUNTER — Ambulatory Visit: Payer: Self-pay | Admitting: *Deleted

## 2021-07-24 NOTE — Telephone Encounter (Signed)
°  Chief Complaint: continued sinus congestion, drainage and cough.  Prescription medications from 07/09/2021 have not helped. Symptoms: see above Frequency: Did not resolve from OV and medications on 07/09/2021 with Dr. Wynetta Emery Pertinent Negatives: Patient denies N/A Disposition: [] ED /[] Urgent Care (no appt availability in office) / [] Appointment(In office/virtual)/ []  [place] [facility]/ [] Home Care/ [] Refused Recommended Disposition /[] [place] [facility]/ [x]  Follow-up with PCP Additional Notes: It looks like Dr. Wynetta Emery wants her to go get a chest x ray and make an appt after getting the x ray done.    It does not look like the pt has been contacted with this information.   I have sent this message that the pt has called back in to [facility] for Dr. Karle Plumber to address.

## 2021-07-24 NOTE — Telephone Encounter (Signed)
I returned pt's call.  Calling c/o continued sinus infection symptoms.   That the medication given to her on 07/09/2021 did not help.   Still having drainage, pressure and cough.  (See note from triage done yesterday by Linus Orn, RN).  I left  voicemail to return the call.  (There is a note in the chart where Dr. Wynetta Emery has asked her RMA Ronda Fairly to find out when pt having her chest x ray done and to schedule an urgent care appt after pt has the x ray done).    I don't see where this has been done yet.  I forwarded this message to Dr. Karle Plumber for follow up since pt called again this morning.   Reason for Disposition  Lots of coughing    See orders from Dr. Wynetta Emery for a chest x ray to be done 07/23/2021.  It does not look like the pt has been contacted with Dr. Durenda Age order to go get a chest x ray done.  Answer Assessment - Initial Assessment Questions 1. LOCATION: "Where does it hurt?"      See triage note from 07/23/2021 from Linus Orn, RN.   Pt c/o sinus infection not being any better after the medications given on 07/09/2021.   I attempted to return her call however I left a voicemail for her to call us back. 2. ONSET: "When did the sinus pain start?"  (e.g., hours, days)      Did not get relief from medications prescribed on 07/09/2021 with Dr. Wynetta Emery. 3. SEVERITY: "How bad is the pain?"   (Scale 1-10; mild, moderate or severe)   - MILD (1-3): doesn't interfere with normal activities    - MODERATE (4-7): interferes with normal activities (e.g., work or school) or awakens from sleep   - SEVERE (8-10): excruciating pain and patient unable to do any normal activities        *No Answer* 4. RECURRENT SYMPTOM: "Have you ever had sinus problems before?" If Yes, ask: "When was the last time?" and "What happened that time?"      *No Answer* 5. NASAL CONGESTION: "Is the nose blocked?" If Yes, ask: "Can you open it or must you breathe through your mouth?"     *No Answer* 6. NASAL  DISCHARGE: "Do you have discharge from your nose?" If so ask, "What color?"     *No Answer* 7. FEVER: "Do you have a fever?" If Yes, ask: "What is it, how was it measured, and when did it start?"      *No Answer* 8. OTHER SYMPTOMS: "Do you have any other symptoms?" (e.g., sore throat, cough, earache, difficulty breathing)     *No Answer* 9. PREGNANCY: "Is there any chance you are pregnant?" "When was your last menstrual period?"     *No Answer*  Protocols used: Sinus Pain or Congestion-A-AH

## 2021-07-24 NOTE — Telephone Encounter (Signed)
Forward Dr. Wynetta Emery pt MyChart response

## 2021-07-24 NOTE — Telephone Encounter (Signed)
Sent pt a MyChart message

## 2021-07-25 ENCOUNTER — Ambulatory Visit: Payer: Self-pay | Admitting: *Deleted

## 2021-07-25 ENCOUNTER — Ambulatory Visit: Payer: Self-pay

## 2021-07-25 ENCOUNTER — Telehealth: Payer: Medicaid Other | Admitting: Physician Assistant

## 2021-07-25 DIAGNOSIS — J9801 Acute bronchospasm: Secondary | ICD-10-CM

## 2021-07-25 DIAGNOSIS — B9689 Other specified bacterial agents as the cause of diseases classified elsewhere: Secondary | ICD-10-CM | POA: Diagnosis not present

## 2021-07-25 DIAGNOSIS — J019 Acute sinusitis, unspecified: Secondary | ICD-10-CM

## 2021-07-25 MED ORDER — PROMETHAZINE-DM 6.25-15 MG/5ML PO SYRP: 5.0000 mL | ORAL_SOLUTION | Freq: Four times a day (QID) | ORAL | 0 refills | Status: DC | PRN

## 2021-07-25 MED ORDER — AMOXICILLIN-POT CLAVULANATE 875-125 MG PO TABS: 1.0000 | ORAL_TABLET | Freq: Two times a day (BID) | ORAL | 0 refills | Status: DC

## 2021-07-25 MED ORDER — LORATADINE 10 MG PO TABS: 10.0000 mg | ORAL_TABLET | Freq: Every day | ORAL | 0 refills | Status: DC

## 2021-07-25 MED ORDER — FLUTICASONE PROPIONATE 50 MCG/ACT NA SUSP: 1.0000 | Freq: Every day | NASAL | 0 refills | Status: DC

## 2021-07-25 MED ORDER — PREDNISONE 20 MG PO TABS: 40.0000 mg | ORAL_TABLET | Freq: Every day | ORAL | 0 refills | Status: DC

## 2021-07-25 NOTE — Progress Notes (Signed)
Virtual Visit Consent   Angela [name], you are scheduled for a virtual visit with a [facility] provider today.     Just as with appointments in the office, your consent must be obtained to participate.  Your consent will be active for this visit and any virtual visit you may have with one of our providers in the next 365 days.     If you have a MyChart account, a copy of this consent can be sent to you electronically.  All virtual visits are billed to your insurance company just like a traditional visit in the office.    As this is a virtual visit, video technology does not allow for your provider to perform a traditional examination.  This may limit your provider's ability to fully assess your condition.  If your provider identifies any concerns that need to be evaluated in person or the need to arrange testing (such as labs, EKG, etc.), we will make arrangements to do so.     Although advances in technology are sophisticated, we cannot ensure that it will always work on either your end or our end.  If the connection with a video visit is poor, the visit may have to be switched to a telephone visit.  With either a video or telephone visit, we are not always able to ensure that we have a secure connection.     I need to obtain your verbal consent now.   Are you willing to proceed with your visit today?    Angela [name] has provided verbal consent on 07/25/2021 for a virtual visit (video or telephone).   Angela [name], [place]   Date: 07/25/2021 12:05 PM   Virtual Visit via Video Note   I, Angela [name], connected with  Angela [name]  ([id number], 1975-10-28) on 07/25/21 at 12:00 PM EST by a video-enabled telemedicine application and verified that I am speaking with the correct person using two identifiers.  Location: Patient: Virtual Visit Location Patient: Home Provider: Virtual Visit Location Provider: Home Office   I discussed the limitations of evaluation and  management by telemedicine and the availability of in person appointments. The patient expressed understanding and agreed to proceed.    History of Present Illness: Angela [name] is a 46 y.o. who identifies as a female who was assigned female at birth, and is being seen today for ongoing nasal and sinus congestion in the past week, worsening over the past couple of days. Denies fever, chills or aches. Notes now with sinus pain and tooth pain in addition to sinus pressure, headache and ear pressure bilaterally. Sent message to her PCP who started her on Flonase and Claritin which she endorses using as directed. Notes still with susbstantial cough mainly dry and in spells or sometimes productive of her post-nasal drainage. Denies any overt chest congestion at present. Feels short of breath only during coughing spells since she notes her nose is stopped up it make her feel like she cannot breathe between the two. Was treated for bronchitis at beginning of the month. Notes resolution of those symptoms prior to onset of current symptoms.   HPI: HPI  Problems:  Patient Active Problem List   Diagnosis Date Noted   Vitamin D deficiency 07/09/2021   Major depressive disorder, recurrent episode, moderate ([place]) 05/16/2020   AKI (acute kidney injury) ([place]) 12/31/2019   Asthma 12/31/2019   Acquired hypothyroidism 12/31/2019   Acute pulmonary embolism ([place]) 12/30/2019   Essential hypertension 07/08/2018  Morbid obesity ([place]) 07/08/2018   Neuropathy of both feet 07/08/2018   Primary osteoarthritis of both knees 04/02/2018   Foot pain, right 04/02/2018   Chronic maxillary sinusitis 09/18/2017   HTN (hypertension), benign 12/17/2016   Pain, pelvic, female 02/05/2012   Fibroids 02/05/2012   Abnormal uterine bleeding 02/05/2012    Allergies:  Allergies  Allergen Reactions   Doxycycline Nausea And Vomiting   Flagyl [Metronidazole Hcl] Hives and Nausea And Vomiting   Medications:  Current Outpatient  Medications:    albuterol (PROVENTIL) (2.5 MG/3ML) 0.083% nebulizer solution, Take 3 mLs (2.5 mg total) by nebulization every 6 (six) hours as needed for wheezing or shortness of breath., Disp: 75 mL, Rfl: 1   albuterol (VENTOLIN HFA) 108 (90 Base) MCG/ACT inhaler, Inhale 1-2 puffs into the lungs every 6 (six) hours as needed for wheezing or shortness of breath., Disp: 8.5 g, Rfl: 6   amLODipine (NORVASC) 10 MG tablet, TAKE 1 TABLET (10 MG TOTAL) BY MOUTH DAILY., Disp: 30 tablet, Rfl: 6   apixaban (ELIQUIS) 5 MG TABS tablet, Take 1 tablet (5 mg total) by mouth 2 (two) times daily., Disp: 60 tablet, Rfl: 3   diclofenac Sodium (VOLTAREN) 1 % GEL, Apply 2 g topically 4 (four) times daily., Disp: 100 g, Rfl: 3   fluticasone (FLONASE) 50 MCG/ACT nasal spray, Place 1 spray into both nostrils daily., Disp: 16 g, Rfl: 0   hydrochlorothiazide (MICROZIDE) 12.5 MG capsule, TAKE 1 TABLET(12.5 MG) BY MOUTH DAILY, Disp: 30 capsule, Rfl: 6   labetalol (NORMODYNE) 300 MG tablet, Take 1 tablet (300 mg total) by mouth 2 (two) times daily., Disp: 60 tablet, Rfl: 5   levothyroxine (SYNTHROID) 25 MCG tablet, TAKE 0.5 TABLETS (12.5 MCG TOTAL) BY MOUTH DAILY AT 6 (SIX) AM., Disp: 15 tablet, Rfl: 4   loratadine (CLARITIN) 10 MG tablet, Take 1 tablet (10 mg total) by mouth daily., Disp: 30 tablet, Rfl: 0   rosuvastatin (CRESTOR) 5 MG tablet, TAKE 1 TABLET (5 MG TOTAL) BY MOUTH DAILY., Disp: 30 tablet, Rfl: 6   solifenacin (VESICARE) 5 MG tablet, Take 1 tablet (5 mg total) by mouth daily. For urine incontience, Disp: 30 tablet, Rfl: 4   triamcinolone cream (KENALOG) 0.1 %, Apply 1 application topically 2 (two) times daily., Disp: 30 g, Rfl: 0   Vitamin D, Ergocalciferol, (DRISDOL) 1.25 MG (50000 UNIT) CAPS capsule, Take 1 capsule (50,000 Units total) by mouth once a week., Disp: 12 capsule, Rfl: 1  Observations/Objective: Patient is well-developed, well-nourished in no acute distress.  Resting comfortably at home.  Head is  normocephalic, atraumatic.  No labored breathing. Speech is clear and coherent with logical content. Some voice hoarseness.  Patient is alert and oriented at baseline.   Assessment and Plan: 1. Acute bacterial sinusitis Rx Augmentin.  Increase fluids.  Rest.  Saline nasal spray.  Probiotic.  Mucinex as directed.  Humidifier in bedroom. Continue Flonase.  Call or return to clinic if symptoms are not improving.   2. Bronchospasm Continue albuterol. Will start prednisone 40 mg daily for 5 days to help calm bronchospasm and prevent full exacerbation of asthma. She is to get a new pulse oximeter to keep at home so she can monitor her O2. Is < 90 she knows she is to seek emergent care. Rx promethazine-DM.  Her PCP had put in order for CXR and I agree she should have done to be cautious.   Follow Up Instructions: I discussed the assessment and treatment plan with the patient.  The patient was provided an opportunity to ask questions and all were answered. The patient agreed with the plan and demonstrated an understanding of the instructions.  A copy of instructions were sent to the patient via MyChart unless otherwise noted below.   The patient was advised to call back or seek an in-person evaluation if the symptoms worsen or if the condition fails to improve as anticipated.  Time:  I spent 12 minutes with the patient via telehealth technology discussing the above problems/concerns.    Angela Rio, PA-C

## 2021-07-25 NOTE — Telephone Encounter (Signed)
Tammy Clarin, pt sister called in stated pt is experiencing SOB.   Tammy call back number is - [phone]    This is pt's  sisters number. Called to let sister know to alert pt NT will be calling, be sure to answer. Called pt at number listed, verified by sister [phone]. Left VM to call back to discuss symptoms.

## 2021-07-25 NOTE — Telephone Encounter (Signed)
°  Chief Complaint: sinus infection Symptoms: congestion, cough, SOB at times, runny nose Frequency: before 07/09/21 Pertinent Negatives: Patient denies fever Disposition: [] ED /[] Urgent Care (no appt availability in office) / [] Appointment(In office/virtual)/ [x]  [facility]/ [] Home Care/ [] Refused Recommended Disposition /[] [facility]/ []  Follow-up with PCP Additional Notes: pt had OV on 07/09/21 was given abx and prednisone and states symptoms haven't improved. She is currently taking Claritin and neb treatments and not helping with symptoms.    Reason for Disposition  [1] MILD difficulty breathing (e.g., minimal/no SOB at rest, SOB with walking, pulse <100) AND [2] NEW-onset or WORSE than normal  Answer Assessment - Initial Assessment Questions 1. RESPIRATORY STATUS: "Describe your breathing?" (e.g., wheezing, shortness of breath, unable to speak, severe coughing)  SOB  2. ONSET: "When did this breathing problem begin?"  Since beginning of the month 3. PATTERN "Does the difficult breathing come and go, or has it been constant since it started?"  Comes and goes 4. SEVERITY: "How bad is your breathing?" (e.g., mild, moderate, severe)    - MILD: No SOB at rest, mild SOB with walking, speaks normally in sentences, can lie down, no retractions, pulse < 100.    - MODERATE: SOB at rest, SOB with minimal exertion and prefers to sit, cannot lie down flat, speaks in phrases, mild retractions, audible wheezing, pulse 100-120.    - SEVERE: Very SOB at rest, speaks in single words, struggling to breathe, sitting hunched forward, retractions, pulse > 120  asthma 5. LUNG HISTORY: "Do you have any history of lung disease?"  (e.g., pulmonary embolus, asthma, emphysema)  6. OTHER SYMPTOMS: "Do you have any other symptoms? (e.g., dizziness, runny nose, cough, chest pain, fever) Congestion, runny nose, cough, unable to lay flat  Protocols used: Breathing Difficulty-A-AH

## 2021-07-25 NOTE — Telephone Encounter (Signed)
° ° °  Error. Pt triaged earlier Answer Assessment - Initial Assessment Questions 1. ONSET: "When did the cough begin?"      07/09/21 2. SEVERITY: "How bad is the cough today?"      *No Answer* 3. SPUTUM: "Describe the color of your sputum" (none, dry cough; clear, white, yellow, green)     *No Answer* 4. HEMOPTYSIS: "Are you coughing up any blood?" If so ask: "How much?" (flecks, streaks, tablespoons, etc.)     *No Answer* 5. DIFFICULTY BREATHING: "Are you having difficulty breathing?" If Yes, ask: "How bad is it?" (e.g., mild, moderate, severe)    - MILD: No SOB at rest, mild SOB with walking, speaks normally in sentences, can lie down, no retractions, pulse < 100.    - MODERATE: SOB at rest, SOB with minimal exertion and prefers to sit, cannot lie down flat, speaks in phrases, mild retractions, audible wheezing, pulse 100-120.    - SEVERE: Very SOB at rest, speaks in single words, struggling to breathe, sitting hunched forward, retractions, pulse > 120      *No Answer* 6. FEVER: "Do you have a fever?" If Yes, ask: "What is your temperature, how was it measured, and when did it start?"     *No Answer* 7. CARDIAC HISTORY: "Do you have any history of heart disease?" (e.g., heart attack, congestive heart failure)      *No Answer* 8. LUNG HISTORY: "Do you have any history of lung disease?"  (e.g., pulmonary embolus, asthma, emphysema)     *No Answer* 9. PE RISK FACTORS: "Do you have a history of blood clots?" (or: recent major surgery, recent prolonged travel, bedridden)     *No Answer* 10. OTHER SYMPTOMS: "Do you have any other symptoms?" (e.g., runny nose, wheezing, chest pain)       *No Answer* 11. PREGNANCY: "Is there any chance you are pregnant?" "When was your last menstrual period?"       *No Answer* 12. TRAVEL: "Have you traveled out of the country in the last month?" (e.g., travel history, exposures)       *No Answer*  Protocols used: Cough - Acute Productive-A-AH

## 2021-07-25 NOTE — Patient Instructions (Signed)
[name], thank you for joining Leeanne Rio, PA-C for today's virtual visit.  While this provider is not your primary care provider (PCP), if your PCP is located in our provider database this encounter information will be shared with them immediately following your visit.  Consent: (Patient) Angela [name] provided verbal consent for this virtual visit at the beginning of the encounter.  Current Medications:  Current Outpatient Medications:    albuterol (PROVENTIL) (2.5 MG/3ML) 0.083% nebulizer solution, Take 3 mLs (2.5 mg total) by nebulization every 6 (six) hours as needed for wheezing or shortness of breath., Disp: 75 mL, Rfl: 1   albuterol (VENTOLIN HFA) 108 (90 Base) MCG/ACT inhaler, Inhale 1-2 puffs into the lungs every 6 (six) hours as needed for wheezing or shortness of breath., Disp: 8.5 g, Rfl: 6   amLODipine (NORVASC) 10 MG tablet, TAKE 1 TABLET (10 MG TOTAL) BY MOUTH DAILY., Disp: 30 tablet, Rfl: 6   apixaban (ELIQUIS) 5 MG TABS tablet, Take 1 tablet (5 mg total) by mouth 2 (two) times daily., Disp: 60 tablet, Rfl: 3   azithromycin (ZITHROMAX Z-PAK) 250 MG tablet, 2 tabs PO x 1 then 1 tab PO daily, Disp: 6 each, Rfl: 0   benzonatate (TESSALON) 100 MG capsule, Take 1 capsule (100 mg total) by mouth 2 (two) times daily as needed for cough., Disp: 30 capsule, Rfl: 0   diclofenac Sodium (VOLTAREN) 1 % GEL, Apply 2 g topically 4 (four) times daily., Disp: 100 g, Rfl: 3   fluticasone (FLONASE) 50 MCG/ACT nasal spray, Place 1 spray into both nostrils daily., Disp: 16 g, Rfl: 0   hydrochlorothiazide (MICROZIDE) 12.5 MG capsule, TAKE 1 TABLET(12.5 MG) BY MOUTH DAILY, Disp: 30 capsule, Rfl: 6   labetalol (NORMODYNE) 300 MG tablet, Take 1 tablet (300 mg total) by mouth 2 (two) times daily., Disp: 60 tablet, Rfl: 5   levothyroxine (SYNTHROID) 25 MCG tablet, TAKE 0.5 TABLETS (12.5 MCG TOTAL) BY MOUTH DAILY AT 6 (SIX) AM., Disp: 15 tablet, Rfl: 4   loratadine (CLARITIN) 10 MG  tablet, Take 1 tablet (10 mg total) by mouth daily., Disp: 30 tablet, Rfl: 0   predniSONE (DELTASONE) 20 MG tablet, 1 tab PO daily x 3 days then 1/2 tab daily x 2 days, Disp: 4 tablet, Rfl: 0   pregabalin (LYRICA) 25 MG capsule, Take 1 capsule (25 mg total) by mouth 2 (two) times daily., Disp: 60 capsule, Rfl: 1   rosuvastatin (CRESTOR) 5 MG tablet, TAKE 1 TABLET (5 MG TOTAL) BY MOUTH DAILY., Disp: 30 tablet, Rfl: 6   solifenacin (VESICARE) 5 MG tablet, Take 1 tablet (5 mg total) by mouth daily. For urine incontience, Disp: 30 tablet, Rfl: 4   triamcinolone cream (KENALOG) 0.1 %, Apply 1 application topically 2 (two) times daily., Disp: 30 g, Rfl: 0   Vitamin D, Ergocalciferol, (DRISDOL) 1.25 MG (50000 UNIT) CAPS capsule, Take 1 capsule (50,000 Units total) by mouth once a week., Disp: 12 capsule, Rfl: 1   Medications ordered in this encounter:  No orders of the defined types were placed in this encounter.    *If you need refills on other medications prior to your next appointment, please contact your pharmacy*  Follow-Up: Call back or seek an in-person evaluation if the symptoms worsen or if the condition fails to improve as anticipated.  Other Instructions Please take antibiotic as directed.  Increase fluid intake.  Use Saline nasal spray.  Take a daily multivitamin. Continue the Claritin and Flonase. You  can take plain Mucinex along with the cough syrup I have sent in for you. As discussed, you need to get the chest x-ray done that your PCP put in for you. Take the prednisone as directed.  Place a humidifier in the bedroom.    ER for any acutely worsening of symptoms.   Sinusitis Sinusitis is redness, soreness, and swelling (inflammation) of the paranasal sinuses. Paranasal sinuses are air pockets within the bones of your face (beneath the eyes, the middle of the forehead, or above the eyes). In healthy paranasal sinuses, mucus is able to drain out, and air is able to circulate through them  by way of your nose. However, when your paranasal sinuses are inflamed, mucus and air can become trapped. This can allow bacteria and other germs to grow and cause infection. Sinusitis can develop quickly and last only a short time (acute) or continue over a long period (chronic). Sinusitis that lasts for more than 12 weeks is considered chronic.  CAUSES  Causes of sinusitis include: Allergies. Structural abnormalities, such as displacement of the cartilage that separates your nostrils (deviated septum), which can decrease the air flow through your nose and sinuses and affect sinus drainage. Functional abnormalities, such as when the small hairs (cilia) that line your sinuses and help remove mucus do not work properly or are not present. SYMPTOMS  Symptoms of acute and chronic sinusitis are the same. The primary symptoms are pain and pressure around the affected sinuses. Other symptoms include: Upper toothache. Earache. Headache. Bad breath. Decreased sense of smell and taste. A cough, which worsens when you are lying flat. Fatigue. Fever. Thick drainage from your nose, which often is green and may contain pus (purulent). Swelling and warmth over the affected sinuses. DIAGNOSIS  Your caregiver will perform a physical exam. During the exam, your caregiver may: Look in your nose for signs of abnormal growths in your nostrils (nasal polyps). Tap over the affected sinus to check for signs of infection. View the inside of your sinuses (endoscopy) with a special imaging device with a light attached (endoscope), which is inserted into your sinuses. If your caregiver suspects that you have chronic sinusitis, one or more of the following tests may be recommended: Allergy tests. Nasal culture A sample of mucus is taken from your nose and sent to a lab and screened for bacteria. Nasal cytology A sample of mucus is taken from your nose and examined by your caregiver to determine if your sinusitis is  related to an allergy. TREATMENT  Most cases of acute sinusitis are related to a viral infection and will resolve on their own within 10 days. Sometimes medicines are prescribed to help relieve symptoms (pain medicine, decongestants, nasal steroid sprays, or saline sprays).  However, for sinusitis related to a bacterial infection, your caregiver will prescribe antibiotic medicines. These are medicines that will help kill the bacteria causing the infection.  Rarely, sinusitis is caused by a fungal infection. In theses cases, your caregiver will prescribe antifungal medicine. For some cases of chronic sinusitis, surgery is needed. Generally, these are cases in which sinusitis recurs more than 3 times per year, despite other treatments. HOME CARE INSTRUCTIONS  Drink plenty of water. Water helps thin the mucus so your sinuses can drain more easily. Use a humidifier. Inhale steam 3 to 4 times a day (for example, sit in the bathroom with the shower running). Apply a warm, moist washcloth to your face 3 to 4 times a day, or as directed by  your caregiver. Use saline nasal sprays to help moisten and clean your sinuses. Take over-the-counter or prescription medicines for pain, discomfort, or fever only as directed by your caregiver. SEEK IMMEDIATE MEDICAL CARE IF: You have increasing pain or severe headaches. You have nausea, vomiting, or drowsiness. You have swelling around your face. You have vision problems. You have a stiff neck. You have difficulty breathing. MAKE SURE YOU:  Understand these instructions. Will watch your condition. Will get help right away if you are not doing well or get worse. Document Released: 05/20/2005 Document Revised: 08/12/2011 Document Reviewed: 06/04/2011 [facility] Patient Information 2014 [place], [place].    If you have been instructed to have an in-person evaluation today at a local Urgent Care facility, please use the link below. It will take you to a list of all  of our available [facility], including address, phone number and hours of operation. Please do not delay care.  [facility]  If you or a family member do not have a primary care provider, use the link below to schedule a visit and establish care. When you choose a [facility] primary care physician or advanced practice provider, you gain a long-term partner in health. Find a Primary Care Provider  Learn more about [facility] in-office and virtual care options: [facility]

## 2021-07-26 ENCOUNTER — Other Ambulatory Visit: Payer: Self-pay | Admitting: Internal Medicine

## 2021-07-26 NOTE — Telephone Encounter (Signed)
Contacted pt to schedule a virtual visit pt states she had one yesterday. Looked in Chart pt had a telehealth yesterday and was prescribed medication. Pt states she doesn't need the appt

## 2021-07-26 NOTE — Telephone Encounter (Signed)
This is being addressed in a MyChart message.

## 2021-07-26 NOTE — Telephone Encounter (Signed)
Requested medication (s) are due for refill today:   Yes    Requested medication (s) are on the active medication list:   Yes  Future visit scheduled:   Yes   Last ordered: 07/25/2021 16 g, 0 refills  Returned because this is a non delegated refill.   Duplicate request.   Requested Prescriptions  Pending Prescriptions Disp Refills   fluticasone (FLONASE) 50 MCG/ACT nasal spray [Pharmacy Med Name: FLUTICASONE 50MCG NASAL SP (120) RX] 16 g 0    Sig: SHAKE LIQUID AND USE 1 SPRAY IN EACH NOSTRIL DAILY     Not Delegated - Ear, Nose, and Throat: Nasal Preparations - Corticosteroids Failed - 07/26/2021  8:03 AM      Failed - This refill cannot be delegated      Passed - Valid encounter within last 12 months    Recent Outpatient Visits           2 weeks ago [place], MD   3 months ago Mild intermittent asthmatic bronchitis without complication   [place], MD   4 months ago Essential hypertension   [place], MD   8 months ago Primary osteoarthritis of both knees   [place], Deborah B, MD   11 months ago Essential hypertension   [place], MD       Future Appointments             In 3 months Wynetta Emery, Dalbert Batman, MD [place]

## 2021-08-01 DIAGNOSIS — R32 Unspecified urinary incontinence: Secondary | ICD-10-CM | POA: Diagnosis not present

## 2021-08-01 DIAGNOSIS — Z419 Encounter for procedure for purposes other than remedying health state, unspecified: Secondary | ICD-10-CM | POA: Diagnosis not present

## 2021-09-01 DIAGNOSIS — Z419 Encounter for procedure for purposes other than remedying health state, unspecified: Secondary | ICD-10-CM | POA: Diagnosis not present

## 2021-09-01 DIAGNOSIS — R32 Unspecified urinary incontinence: Secondary | ICD-10-CM | POA: Diagnosis not present

## 2021-10-01 DIAGNOSIS — R32 Unspecified urinary incontinence: Secondary | ICD-10-CM | POA: Diagnosis not present

## 2021-10-02 ENCOUNTER — Other Ambulatory Visit: Payer: Self-pay | Admitting: Pharmacist

## 2021-10-02 DIAGNOSIS — L853 Xerosis cutis: Secondary | ICD-10-CM

## 2021-10-02 DIAGNOSIS — E782 Mixed hyperlipidemia: Secondary | ICD-10-CM

## 2021-10-02 DIAGNOSIS — I10 Essential (primary) hypertension: Secondary | ICD-10-CM

## 2021-10-02 MED ORDER — LORATADINE 10 MG PO TABS: 10.0000 mg | ORAL_TABLET | Freq: Every day | ORAL | 0 refills | Status: DC

## 2021-10-02 MED ORDER — TRIAMCINOLONE ACETONIDE 0.1 % EX CREA: 1.0000 "application " | TOPICAL_CREAM | Freq: Two times a day (BID) | CUTANEOUS | 0 refills | Status: DC

## 2021-10-02 MED ORDER — ROSUVASTATIN CALCIUM 5 MG PO TABS: ORAL_TABLET | Freq: Every day | ORAL | 0 refills | Status: DC

## 2021-10-02 MED ORDER — ALBUTEROL SULFATE (2.5 MG/3ML) 0.083% IN NEBU: 2.5000 mg | INHALATION_SOLUTION | Freq: Four times a day (QID) | RESPIRATORY_TRACT | 1 refills | Status: DC | PRN

## 2021-10-02 MED ORDER — FLUTICASONE PROPIONATE 50 MCG/ACT NA SUSP
1.0000 | Freq: Every day | NASAL | 2 refills | Status: DC
Start: 2021-10-02 — End: 2023-07-15

## 2021-10-02 NOTE — Chronic Care Management (AMB) (Signed)
? ?   ? ?Chief Complaint  ?Patient presents with  ? High Risk Managed Medicaid  ? ? ?Angela [name] is a 46 y.o. year old female who was referred for medication management by their primary care provider, Ladell Pier, MD. They presented for a telephone visit. ?  ?They were referred to the pharmacist by a quality report for assistance in managing hypertension.  ? ?Subjective: ? ?Care Team: ?Primary Care Provider: Ladell Pier, MD ; Next Scheduled Visit: 11/06/21 ?Cardiologist: Johney Frame; Next Scheduled Visit: needs to schedule ? ?Medication Access/Adherence ? ?Current Pharmacy:  ?[facility], [address] ?[place] [address] ?[place] [address] ?Phone: [phone] Fax: [phone] ? ? ?Patient reports affordability concerns with their medications: No  ?Patient reports access/transportation concerns to their pharmacy: No  ?Patient reports adherence concerns with their medications:  Yes  reports missing some doses periodically ? ? ?Hypertension: ? ?Current medications: amlodipine 10 mg daily, HCTZ 25 mg daily, labetolol 300 mg twice daily ? ?Patient does not have a validated, automated, upper arm home BP cuff but she ordered one to arrive later this week ? ?Patient denies hypotensive s/sx including dizziness, lightheadedness.  ?Patient denies hypertensive symptoms including headache, chest pain, shortness of breath ? ? ?Hyperlipidemia/ASCVD Risk Reduction ? ?Current lipid lowering medications: rosuvastatin 5 mg daily ? ?Antiplatelet regimen: none due to DOAC ? ?Asthma/Allergies: ? ?Current medications: albuterol HFA and nebulizer - taking several times daily right now due to wheezing, shortness of breath; flonase nasal spray, loratidine  ? ?Reports a prior history of Advair and montelukast with good benefit, but use lapsed when she lost Medicaid. Would be interested in going back on therapy.  ? ?Hx PE: ?Current  medications: Eliquis 5 mg twice daily ? ?Reports some "soreness/tenderness to the touch" in legs bilaterally, comes and goes. Denies significant erythema or heat. Notes the sensation is not constant. Does note occasional missed doses of Eliquis.  ? ?OAB: ?Current medications: solifenacin 5 mg daily ? ?Hypothyroidism: ?Current medications: levothyroxine 12.5 mcg daily ? ?Health Maintenance ? ?Health Maintenance Due  ?Topic Date Due  ? COVID-19 Vaccine (1) Never done  ? Hepatitis C Screening  Never done  ? PAP SMEAR-Modifier  02/16/2019  ?  ? ?Objective: ?Lab Results  ?Component Value Date  ? HGBA1C 5.7 (H) 11/24/2020  ? ? ?Lab Results  ?Component Value Date  ? CREATININE 1.08 (H) 03/05/2021  ? BUN 14 03/05/2021  ? NA 137 03/05/2021  ? K 4.3 03/05/2021  ? CL 100 03/05/2021  ? CO2 23 03/05/2021  ? ? ?Lab Results  ?Component Value Date  ? CHOL 175 07/09/2021  ? HDL 41 07/09/2021  ? LDLCALC 115 (H) 07/09/2021  ? TRIG 104 07/09/2021  ? CHOLHDL 4.3 07/09/2021  ? ? ?Medications Reviewed Today   ? ? Reviewed by Osker Mason, RPH-CPP (Pharmacist) on 10/02/21 at 1522  Med List Status: <None>  ? ?Medication Order Taking? Sig Documenting Provider Last Dose Status Informant  ?albuterol (PROVENTIL) (2.5 MG/3ML) 0.083% nebulizer solution [id number] Yes Take 3 mLs (2.5 mg total) by nebulization every 6 (six) hours as needed for wheezing or shortness of breath. Ladell Pier, MD Taking Active   ?albuterol (VENTOLIN HFA) 108 (90 Base) MCG/ACT inhaler [id number] Yes Inhale 1-2 puffs into the lungs every 6 (six) hours as needed for wheezing or shortness of breath. Ladell Pier, MD Taking Active   ?amLODipine (NORVASC) 10 MG tablet  [id number] Yes TAKE 1 TABLET (10 MG TOTAL) BY MOUTH DAILY. Ladell Pier, MD Taking Active   ?apixaban (ELIQUIS) 5 MG TABS tablet [id number] Yes Take 1 tablet (5 mg total) by mouth 2 (two) times daily. Ladell Pier, MD Taking Active   ?diclofenac Sodium (VOLTAREN) 1 % GEL [id number] Yes  Apply 2 g topically 4 (four) times daily. Ladell Pier, MD Taking Active   ?fluticasone [facility]) 50 MCG/ACT nasal spray [id number] Yes Place 1 spray into both nostrils daily. Ladell Pier, MD Taking Active   ?hydrochlorothiazide (MICROZIDE) 12.5 MG capsule [id number] Yes TAKE 1 TABLET(12.5 MG) BY MOUTH DAILY Ladell Pier, MD Taking Active   ?labetalol (NORMODYNE) 300 MG tablet [id number] Yes Take 1 tablet (300 mg total) by mouth 2 (two) times daily. Ladell Pier, MD Taking Active   ?levothyroxine (SYNTHROID) 25 MCG tablet [id number] Yes TAKE 0.5 TABLETS (12.5 MCG TOTAL) BY MOUTH DAILY AT 6 (SIX) AM. Ladell Pier, MD Taking Active   ?loratadine (CLARITIN) 10 MG tablet [id number] Yes Take 1 tablet (10 mg total) by mouth daily. Ladell Pier, MD Taking Active   ?rosuvastatin (CRESTOR) 5 MG tablet [id number] Yes TAKE 1 TABLET (5 MG TOTAL) BY MOUTH DAILY. Ladell Pier, MD Taking Active   ?solifenacin (VESICARE) 5 MG tablet [id number] Yes Take 1 tablet (5 mg total) by mouth daily. For urine incontience Ladell Pier, MD Taking Active   ?triamcinolone cream (KENALOG) 0.1 % [id number] Yes Apply 1 application topically 2 (two) times daily. Ladell Pier, MD Taking Active   ?Vitamin D, Ergocalciferol, (DRISDOL) 1.25 MG (50000 UNIT) CAPS capsule [id number] Yes Take 1 capsule (50,000 Units total) by mouth once a week. Ladell Pier, MD Taking Active   ? ?  ?  ? ?  ? ? ?Assessment/Plan:  ?Care Plan : Medication Monitoring  ?Updates made by Osker Mason, RPH-CPP since 10/02/2021 12:00 AM  ?  ? ?Problem: Hypertension   ?  ? ?Goal: BP <130/80   ?Note:   ?Current Barriers:  ?Unable to achieve control of hypertension  ? ?Patient Needs: ?Working device to evaluate blood pressure ? ?Patient Activities: ?Patient will:  ?- check blood pressure daily, document, and provide at future appointments ? ?  ? ?Problem: Asthma   ?  ? ?Goal: Reduce Risk of Exacerbations   ?Note:   ?Current  Barriers:  ?Suboptimal therapeutic regimen for asthma ? ?Patient Needs: ?Optimized regimen ? ?Patient Activities: ?Patient will:  ?- collaborate with provider on medication access solutions ? ?  ? ? ?Hypertension: ?- Currently uncontrolled ?- Reviewed long term cardiovascular and renal outcomes of uncontrolled blood pressure ?- Reviewed appropriate blood pressure monitoring technique and reviewed goal blood pressure. Recommended to check home blood pressure and heart rate daily ?- Will follow up on home blood pressures in ~ 2 weeks ?- Overdue for follow up with Dr. Johney Frame. Will discuss moving forward.  ? ?Hyperlipidemia/ASCVD Risk Reduction: ?- Currently uncontrolled but anticipated to be improved with adherence ?- Recommend to continue current regimen. Follow up with PCP as scheduled next month ? ?Asthma: ?- Currently uncontrolled.  ?- Recommend to discuss addition of Advair and montelukast with PCP given prior benefit.   ? ?Hx PE: ?- Discussed to seek urgent care immediately if consistent pain, swelling, and redness given history of PE.  ?- Does not appear heme workup happened. Will revisit moving forward.  ? ?OAB: ?- Recommended to continue current regimen ? ?Hypothyroidism ?- Recommended to continue current  regimen ? ? ?Follow Up Plan: phone call in 2 weeks ? ?[name], PharmD, BCACP ?[place] ?[phone] ? ?  ?

## 2021-10-02 NOTE — Patient Instructions (Addendum)
It was great to speak with you today! ? ?Once your get your blood pressure cuff, check your blood pressure once daily, and any time you have concerning symptoms like headache, chest pain, dizziness, shortness of breath, or vision changes.  ? ?Our goal is less than 130/80. ? ?To appropriately check your blood pressure, make sure you do the following:  ?1) Avoid caffeine, exercise, or tobacco products for 30 minutes before checking. Empty your bladder. ?2) Sit with your back supported in a flat-backed chair. Rest your arm on something flat (arm of the chair, table, etc). ?3) Sit still with your feet flat on the floor, resting, for at least 5 minutes.  ?4) Check your blood pressure. Take 1-2 readings.  ?5) Write down these readings and bring with you to any provider appointments. ? ?Bring your home blood pressure machine with you to a provider's office for accuracy comparison at least once a year.  ? ?Make sure you take your blood pressure medications before you come to any office visit, even if you were asked to fast for labs. ? ? ?If you have any worsening of those leg symptoms, please seek urgent care immediately.  ? ?I'll call in 2 weeks to follow up. Thanks! ? ?[name], PharmD, BCACP ?[place] ?[phone] ? ? ?Plan Transportation Information ? ? ? ?If you are experiencing a medical emergency, please call 911 or report to your local emergency department or urgent care.  ? ?If you have a non-emergency medical problem during routine business hours, please contact your provider's office and ask to speak with a nurse.  ? ?For questions related to your [facility] health plan, please call: [phone] or go here:[email or web address] ? ?If you would like to schedule transportation through your [facility] plan, please call the following number at least 2 days in advance of your appointment: [phone]. ? You can also use the MTM portal or MTM mobile app to manage your  rides. For the portal, please go to [email or web address]. ? ? ?

## 2021-10-16 ENCOUNTER — Ambulatory Visit: Payer: Self-pay

## 2021-10-16 ENCOUNTER — Telehealth (HOSPITAL_BASED_OUTPATIENT_CLINIC_OR_DEPARTMENT_OTHER): Payer: Medicaid Other | Admitting: Nurse Practitioner

## 2021-10-16 ENCOUNTER — Encounter: Payer: Self-pay | Admitting: Nurse Practitioner

## 2021-10-16 ENCOUNTER — Other Ambulatory Visit: Payer: Self-pay

## 2021-10-16 DIAGNOSIS — I10 Essential (primary) hypertension: Secondary | ICD-10-CM | POA: Diagnosis not present

## 2021-10-16 DIAGNOSIS — Z1231 Encounter for screening mammogram for malignant neoplasm of breast: Secondary | ICD-10-CM | POA: Diagnosis not present

## 2021-10-16 DIAGNOSIS — E039 Hypothyroidism, unspecified: Secondary | ICD-10-CM

## 2021-10-16 MED ORDER — HYDROCHLOROTHIAZIDE 25 MG PO TABS: 25.0000 mg | ORAL_TABLET | Freq: Every day | ORAL | 3 refills | Status: DC

## 2021-10-16 NOTE — Progress Notes (Signed)
Virtual Visit Note ? I discussed the limitations, risks, security and privacy concerns of performing an evaluation and management service by video and the availability of in person appointments. I also discussed with the patient that there may be a patient responsible charge related to this service. The patient expressed understanding and agreed to proceed.  ? ? ?I connected with Angela [name] on 10/16/21  at   9:50 AM EDT  EDT by VIDEO and verified that I am speaking with the correct person using two identifiers. ? ? ?Location of Patient: ?Private Residence ?  ?Location of Provider: ?Scientist, research (physical sciences) and [employer] Office  ?  ?Persons participating in VIRTUAL visit: ?Geryl Rankins FNP-BC ?Angela [name]  ?  ?History of Present Illness: ?VIRTUAL visit for: Insomnia ? ?Current symptoms are compatible with OSA.  She endorses difficulty falling asleep and staying asleep with nighttime awakenings, falling asleep in the car when someone else is driving, falling asleep while talking to people on the phone,  daytime fatigue, morning fatigue, morning headache, and hypertension. Patient generally gets a few hours of sleep per night. Snoring of moderate severity is present. Apneic episodes are present. Nasal obstruction is not present.  Patient has not had a tonsillectomy. ?Will likely need a sleep study prior to prescribing any sleep agents. Also her thyroid level is abnormal and she has not been taking her thyroid medication as prescribed. BMI 73 ? ?Notes blood pressure reading today of 185/131. She is asymptomatic. Will increase HCTZ to 25 mg instead of 12.5 mg. She has not taken her blood pressure medications in a few days: amlodipine 10 mg daily, labetalol 300 mg BID.  ? ?Past Medical History:  ?Diagnosis Date  ? Allergies   ? Asthma   ? Back pain   ? Depression   ? hx, doing ok now  ? Edema, lower extremity   ? GERD (gastroesophageal reflux disease)   ? Headache(784.0)   ? Heart murmur   ? Hypertension   ? on  meds  ? IBS (irritable bowel syndrome)   ? Joint pain   ? Major depressive disorder, recurrent episode, moderate ([place]) 05/16/2020  ? Neuropathy   ? Obesity   ? Osteoarthritis   ? Ovarian cyst   ? Prediabetes   ? Preterm labor   ? Vitamin D deficiency   ?  ?Past Surgical History:  ?Procedure Laterality Date  ? stye removal   1994  ? TUBAL LIGATION  1998  ?  ?Family History  ?Problem Relation Age of Onset  ? Hyperlipidemia Mother   ? Hypertension Mother   ? Thyroid disease Mother   ? Obesity Mother   ? Clotting disorder Mother   ? Cancer Son   ? Hypertension Other   ? Diabetes Other   ? Other Neg Hx   ?  ?Social History  ? ?Socioeconomic History  ? Marital status: Single  ?  Spouse name: Not on file  ? Number of children: Not on file  ? Years of education: Not on file  ? Highest education level: Not on file  ?Occupational History  ? Occupation: Licensed Cosmetologist  ?Tobacco Use  ? Smoking status: Never  ? Smokeless tobacco: Never  ?Vaping Use  ? Vaping Use: Never used  ?Substance and Sexual Activity  ? Alcohol use: No  ? Drug use: No  ? Sexual activity: Yes  ?  Birth control/protection: Condom, Surgical  ?Other Topics Concern  ? Not on file  ?Social History Narrative  ? Not  on file  ? ?Social Determinants of Health  ? ?Financial Resource Strain: Not on file  ?Food Insecurity: Not on file  ?Transportation Needs: Not on file  ?Physical Activity: Not on file  ?Stress: Not on file  ?Social Connections: Not on file  ?  ? ?Observations/Objective: ?Awake, alert and oriented x 3 ? ? ?Review of Systems  ?Constitutional:  Negative for fever, malaise/fatigue and weight loss.  ?HENT: Negative.  Negative for nosebleeds.   ?Eyes: Negative.  Negative for blurred vision, double vision and photophobia.  ?Respiratory: Negative.  Negative for cough and shortness of breath.   ?Cardiovascular: Negative.  Negative for chest pain, palpitations and leg swelling.  ?Gastrointestinal: Negative.  Negative for heartburn, nausea and vomiting.   ?Musculoskeletal: Negative.  Negative for myalgias.  ?Neurological: Negative.  Negative for dizziness, focal weakness, seizures and headaches.  ?Psychiatric/Behavioral:  Negative for suicidal ideas. The patient has insomnia.    ?Assessment and Plan: ?Diagnoses and all orders for this visit: ? ?Primary hypertension ?-     hydrochlorothiazide (HYDRODIURIL) 25 MG tablet; Take 1 tablet (25 mg total) by mouth daily. ?-     CMP14+EGFR; Future ?Continue all antihypertensives as prescribed.  ?Remember to bring in your blood pressure log with you for your follow up appointment.  ?DASH/Mediterranean Diets are healthier choices for HTN.   ? ?Acquired hypothyroidism ?-     Thyroid Panel With TSH; Future ? ?Breast cancer screening by mammogram ?-     MM 3D SCREEN BREAST BILATERAL; Future ? ?  ? ?Follow Up Instructions ?Return in about 4 weeks (around 11/13/2021).  ?Likely needs sleep study and repeat TSH ? ? ?I discussed the assessment and treatment plan with the patient. The patient was provided an opportunity to ask questions and all were answered. The patient agreed with the plan and demonstrated an understanding of the instructions. ?  ?The patient was advised to call back or seek an in-person evaluation if the symptoms worsen or if the condition fails to improve as anticipated. ? ?I provided 12 minutes of face-to-face time during this encounter including median intraservice time, reviewing previous notes, labs, imaging, medications and explaining diagnosis and management. ? ?Gildardo Pounds, FNP-BC  ?

## 2021-10-16 NOTE — Patient Instructions (Signed)
[name],  ? ?It was great talking to you today! ? ?Check your blood pressure once daily, and any time you have concerning symptoms like headache, chest pain, dizziness, shortness of breath, or vision changes.  ? ?Our goal is less than 130/80. ? ?To appropriately check your blood pressure, make sure you do the following:  ?1) Avoid caffeine, exercise, or tobacco products for 30 minutes before checking. Empty your bladder. ?2) Sit with your back supported in a flat-backed chair. Rest your arm on something flat (arm of the chair, table, etc). ?3) Sit still with your feet flat on the floor, resting, for at least 5 minutes.  ?4) Check your blood pressure. Take 1-2 readings.  ?5) Write down these readings and bring with you to any provider appointments. ? ?Bring your home blood pressure machine with you to a provider's office for accuracy comparison at least once a year.  ? ?Make sure you take your blood pressure medications before you come to any office visit, even if you were asked to fast for labs. ? ?Try setting an alarm on your phone at 11 am and 11 pm to remind you to take your medications.  ? ?[name], PharmD, BCACP ?[place] ?[phone] ? ?

## 2021-10-16 NOTE — Chronic Care Management (AMB) (Signed)
? ?   ? ?Chief Complaint  ?Patient presents with  ? Hypertension  ? ? ?Angela [name] is a 46 y.o. year old female who was referred for medication management by their primary care provider, Ladell Pier, MD. They presented for a telephone visit. ?  ?They were referred to the pharmacist by a quality report for assistance in managing hypertension.  ? ?Subjective: ? ?Care Team: ?Primary Care Provider: Ladell Pier, MD ; Next Scheduled Visit: 11/06/21 ?Cardiologist: Johney Frame; Next Scheduled Visit: needs to schedule ? ?Medication Access/Adherence ? ?Current Pharmacy:  ?[facility], [address] ?[place] ?[place] [address] ?Phone: [phone] Fax: [phone] ? ? ?Patient reports affordability concerns with their medications: No  ?Patient reports access/transportation concerns to their pharmacy: No  ?Patient reports adherence concerns with their medications:  Yes  reports she often falls asleep and misses doses of her medications, especially at night ? ? ?Hypertension: ? ?Current medications: amlodipine 10 mg daily, HCTZ 25 mg daily- just increased today, labetolol 300 mg twice daily ? ?Patient has a validated, automated, upper arm home BP cuff ?Current blood pressure readings readings: this morning before medications was 185/131 per her report. She discussed this with Geryl Rankins in a video visit today ? ?Patient denies hypertensive symptoms including headache, chest pain, shortness of breath ? ? ? ?Health Maintenance ? ?Health Maintenance Due  ?Topic Date Due  ? COVID-19 Vaccine (1) Never done  ? Hepatitis C Screening  Never done  ? PAP SMEAR-Modifier  02/16/2019  ?  ? ?Objective: ?Lab Results  ?Component Value Date  ? HGBA1C 5.7 (H) 11/24/2020  ? ? ?Lab Results  ?Component Value Date  ? CREATININE 1.08 (H) 03/05/2021  ? BUN 14 03/05/2021  ? NA 137 03/05/2021  ? K 4.3 03/05/2021  ? CL 100 03/05/2021  ? CO2  23 03/05/2021  ? ? ?Lab Results  ?Component Value Date  ? CHOL 175 07/09/2021  ? HDL 41 07/09/2021  ? LDLCALC 115 (H) 07/09/2021  ? TRIG 104 07/09/2021  ? CHOLHDL 4.3 07/09/2021  ? ? ?Medications Reviewed Today   ? ? Reviewed by Gildardo Pounds, NP (Nurse Practitioner) on 10/16/21 at 1159  Med List Status: <None>  ? ?Medication Order Taking? Sig Documenting Provider Last Dose Status Informant  ?albuterol (PROVENTIL) (2.5 MG/3ML) 0.083% nebulizer solution [id number]  Take 3 mLs (2.5 mg total) by nebulization every 6 (six) hours as needed for wheezing or shortness of breath. Ladell Pier, MD  Active   ?albuterol (VENTOLIN HFA) 108 (90 Base) MCG/ACT inhaler [id number] No Inhale 1-2 puffs into the lungs every 6 (six) hours as needed for wheezing or shortness of breath. Ladell Pier, MD Taking Active   ?amLODipine (NORVASC) 10 MG tablet [id number] No TAKE 1 TABLET (10 MG TOTAL) BY MOUTH DAILY. Ladell Pier, MD Taking Active   ?apixaban (ELIQUIS) 5 MG TABS tablet [id number] No Take 1 tablet (5 mg total) by mouth 2 (two) times daily. Ladell Pier, MD Taking Active   ?diclofenac Sodium (VOLTAREN) 1 % GEL [id number] No Apply 2 g topically 4 (four) times daily. Ladell Pier, MD Taking Active   ?fluticasone (FLONASE) 50 MCG/ACT nasal spray [id number]  Place 1 spray into both nostrils daily. Ladell Pier, MD  Active   ?hydrochlorothiazide (HYDRODIURIL) 25 MG tablet [id number] Yes Take 1 tablet (25 mg total) by mouth daily. Gildardo Pounds, NP  Active   ?  labetalol (NORMODYNE) 300 MG tablet [id number] No Take 1 tablet (300 mg total) by mouth 2 (two) times daily. Ladell Pier, MD Taking Active   ?levothyroxine (SYNTHROID) 25 MCG tablet [id number] No TAKE 0.5 TABLETS (12.5 MCG TOTAL) BY MOUTH DAILY AT 6 (SIX) AM. Ladell Pier, MD Taking Active   ?loratadine (CLARITIN) 10 MG tablet [id number]  Take 1 tablet (10 mg total) by mouth daily. Ladell Pier, MD  Active   ?rosuvastatin (CRESTOR)  5 MG tablet [id number]  TAKE 1 TABLET (5 MG TOTAL) BY MOUTH DAILY. Ladell Pier, MD  Active   ?solifenacin (VESICARE) 5 MG tablet [id number] No Take 1 tablet (5 mg total) by mouth daily. For urine incontience Ladell Pier, MD Taking Active   ?triamcinolone cream (KENALOG) 0.1 % [id number]  Apply 1 application. topically 2 (two) times daily. Ladell Pier, MD  Active   ?Vitamin D, Ergocalciferol, (DRISDOL) 1.25 MG (50000 UNIT) CAPS capsule [id number] No Take 1 capsule (50,000 Units total) by mouth once a week. Ladell Pier, MD Taking Active   ? ?  ?  ? ?  ? ? ?Assessment/Plan:  ?Care Plan : Medication Monitoring  ?Updates made by Osker Mason, RPH-CPP since 10/16/2021 12:00 AM  ?  ? ?Problem: Hypertension   ?  ? ?Goal: BP <130/80   ?Note:   ?Current Barriers:  ?Unable to achieve control of hypertension  ? ?Patient Needs: ?Working device to evaluate blood pressure ? ?Patient Activities: ?Patient will:  ?- check blood pressure daily, document, and provide at future appointments ? ? ?  ? ? ?Hypertension: ?- Currently uncontrolled ?- Reviewed appropriate blood pressure monitoring technique and reviewed goal blood pressure. Recommended to check home blood pressure and heart rate daily, ~ 30 minutes after taking morning medications ?- Recommend to continue regimen at this time. Will follow up next week.  ?- Discussed adherence strategies, including using a pill box and setting an alarm on her phone to help stay adherent to her twice daily medications ? ? ?Follow Up Plan: telephone call in 1 week ? ?[name], PharmD, BCACP ?[place] ?[phone] ? ? ? ?

## 2021-10-18 ENCOUNTER — Ambulatory Visit: Payer: Self-pay

## 2021-10-23 ENCOUNTER — Ambulatory Visit: Payer: Medicaid Other

## 2021-11-01 DIAGNOSIS — Z419 Encounter for procedure for purposes other than remedying health state, unspecified: Secondary | ICD-10-CM | POA: Diagnosis not present

## 2021-11-01 DIAGNOSIS — R32 Unspecified urinary incontinence: Secondary | ICD-10-CM | POA: Diagnosis not present

## 2021-11-06 ENCOUNTER — Ambulatory Visit: Payer: Medicaid Other | Attending: Internal Medicine | Admitting: Internal Medicine

## 2021-11-06 DIAGNOSIS — I10 Essential (primary) hypertension: Secondary | ICD-10-CM

## 2021-11-06 DIAGNOSIS — Z86711 Personal history of pulmonary embolism: Secondary | ICD-10-CM | POA: Diagnosis not present

## 2021-11-06 DIAGNOSIS — Z1211 Encounter for screening for malignant neoplasm of colon: Secondary | ICD-10-CM | POA: Diagnosis not present

## 2021-11-06 DIAGNOSIS — M170 Bilateral primary osteoarthritis of knee: Secondary | ICD-10-CM

## 2021-11-06 DIAGNOSIS — Z124 Encounter for screening for malignant neoplasm of cervix: Secondary | ICD-10-CM | POA: Diagnosis not present

## 2021-11-06 DIAGNOSIS — R0683 Snoring: Secondary | ICD-10-CM

## 2021-11-06 DIAGNOSIS — E039 Hypothyroidism, unspecified: Secondary | ICD-10-CM

## 2021-11-06 DIAGNOSIS — J4520 Mild intermittent asthma, uncomplicated: Secondary | ICD-10-CM

## 2021-11-06 DIAGNOSIS — E785 Hyperlipidemia, unspecified: Secondary | ICD-10-CM

## 2021-11-06 MED ORDER — APIXABAN 5 MG PO TABS: 5.0000 mg | ORAL_TABLET | Freq: Two times a day (BID) | ORAL | 3 refills | Status: DC

## 2021-11-06 MED ORDER — ALBUTEROL SULFATE (2.5 MG/3ML) 0.083% IN NEBU: 2.5000 mg | INHALATION_SOLUTION | Freq: Four times a day (QID) | RESPIRATORY_TRACT | 1 refills | Status: DC | PRN

## 2021-11-06 NOTE — Progress Notes (Signed)
Patient ID: Angela [name], female   DOB: 1975-11-13, 46 y.o.   MRN: [id number] Virtual Visit via Telephone Note  I connected with Angela [name] on 11/06/2021 at 1:41 PM by telephone and verified that I am speaking with the correct person using two identifiers  Location: Patient: home Provider: office  Participants: Myself Patient   I discussed the limitations, risks, security and privacy concerns of performing an evaluation and management service by telephone and the availability of in person appointments. I also discussed with the patient that there may be a patient responsible charge related to this service. The patient expressed understanding and agreed to proceed.   History of Present Illness: Hx of HTN, HL, morbid obesity, pre-DM, OA knees, hypothyroid, unprovoked BL PE, asthmatic bronchitis  Pt had visit with NP Raul Del last mth.  During that visit, she c/o tiredness and no energy which she still endorses today. She endorses loud snoring, waking up feeling unrefreshed in the mornings and falling asleep easily. She wonders about her thyroid level.  She was not taking the levothyroxine consistently when she spoke with the NP last month.  However since then she has been taking it every day.  HTN: Blood pressure elevated on last visit with the nurse practitioner.  She tells me she had been out of her medicines at that time but since then has received refills and taking consistently.  She is on HCTZ 25 mg daily, amlodipine 10 mg daily, labetalol 300 mg twice a day.  Checks blood pressure once a day but did not check today.  She does not have any numbers handy to share with me but reports that they have not been elevated since she has been back on her medicines.  Needing refill on Eliquis. HL: Reports compliance with taking Crestor. Requesting refill on albuterol nebulizer treatments.  She has history of asthmatic bronchitis.  Wanting to know if I can prescribe pain medicine for her  knees.  She has known osteoarthritis.  Unable to put her on Celebrex at this time as she is on Eliquis.  We only prescribed tramadol or Tylenol with codeine through our practice.  She has not found them helpful in the past.      Outpatient Encounter Medications as of 11/06/2021  Medication Sig   albuterol (PROVENTIL) (2.5 MG/3ML) 0.083% nebulizer solution Take 3 mLs (2.5 mg total) by nebulization every 6 (six) hours as needed for wheezing or shortness of breath.   albuterol (VENTOLIN HFA) 108 (90 Base) MCG/ACT inhaler Inhale 1-2 puffs into the lungs every 6 (six) hours as needed for wheezing or shortness of breath.   amLODipine (NORVASC) 10 MG tablet TAKE 1 TABLET (10 MG TOTAL) BY MOUTH DAILY.   apixaban (ELIQUIS) 5 MG TABS tablet Take 1 tablet (5 mg total) by mouth 2 (two) times daily.   diclofenac Sodium (VOLTAREN) 1 % GEL Apply 2 g topically 4 (four) times daily.   fluticasone (FLONASE) 50 MCG/ACT nasal spray Place 1 spray into both nostrils daily.   hydrochlorothiazide (HYDRODIURIL) 25 MG tablet Take 1 tablet (25 mg total) by mouth daily.   labetalol (NORMODYNE) 300 MG tablet Take 1 tablet (300 mg total) by mouth 2 (two) times daily.   levothyroxine (SYNTHROID) 25 MCG tablet TAKE 0.5 TABLETS (12.5 MCG TOTAL) BY MOUTH DAILY AT 6 (SIX) AM.   loratadine (CLARITIN) 10 MG tablet Take 1 tablet (10 mg total) by mouth daily.   rosuvastatin (CRESTOR) 5 MG tablet TAKE 1 TABLET (5 MG TOTAL) BY  MOUTH DAILY.   solifenacin (VESICARE) 5 MG tablet Take 1 tablet (5 mg total) by mouth daily. For urine incontience   triamcinolone cream (KENALOG) 0.1 % Apply 1 application. topically 2 (two) times daily.   Vitamin D, Ergocalciferol, (DRISDOL) 1.25 MG (50000 UNIT) CAPS capsule Take 1 capsule (50,000 Units total) by mouth once a week.   No facility-administered encounter medications on file as of 11/06/2021.      Observations/Objective: No direct observation done as this was a telephone encounter.  Assessment  and Plan: 1. Loud snoring History suggestive of OSA.  She is agreeable to sleep study. - [facility] Sleep Study; Future  2. Primary hypertension Continue current medications including HCTZ 25 mg daily, labetalol 300 mg twice a day and amlodipine 10 mg daily.  Continue to monitor blood pressure daily with goal being 130/80 or lower.  Encourage compliance with medications.  3. Acquired hypothyroidism Continue levothyroxine.  Come to the lab when she can for thyroid level check.  4. Hyperlipidemia, unspecified hyperlipidemia type Continue Crestor 5 mg daily.  5. Mild intermittent asthmatic bronchitis without complication - albuterol (PROVENTIL) (2.5 MG/3ML) 0.083% nebulizer solution; Take 3 mLs (2.5 mg total) by nebulization every 6 (six) hours as needed for wheezing or shortness of breath.  Dispense: 75 mL; Refill: 1  6. Primary osteoarthritis of both knees We will refer to pain clinic for management.  She had seen Dr. Ninfa Linden in the past from Ortho. - Ambulatory referral to Pain Clinic  7. Screening for colon cancer Agreeable to colon cancer screening with Cologuard test - Cologuard  8. Cervical cancer screening Due for Pap smear.  Due to her body habitus, will refer to gynecology to have her Pap smear done. - Ambulatory referral to Gynecology  9. History of pulmonary embolism - apixaban (ELIQUIS) 5 MG TABS tablet; Take 1 tablet (5 mg total) by mouth 2 (two) times daily.  Dispense: 60 tablet; Refill: 3   Follow Up Instructions: 3 mths   I discussed the assessment and treatment plan with the patient. The patient was provided an opportunity to ask questions and all were answered. The patient agreed with the plan and demonstrated an understanding of the instructions.   The patient was advised to call back or seek an in-person evaluation if the symptoms worsen or if the condition fails to improve as anticipated.  I  Spent 8 minutes on this telephone encounter  This note has been  created with Surveyor, quantity. Any transcriptional errors are unintentional.  Karle Plumber, MD

## 2021-11-07 ENCOUNTER — Other Ambulatory Visit: Payer: Self-pay | Admitting: Internal Medicine

## 2021-11-07 ENCOUNTER — Ambulatory Visit: Payer: Medicaid Other

## 2021-11-07 ENCOUNTER — Encounter: Payer: Self-pay | Admitting: Internal Medicine

## 2021-11-07 DIAGNOSIS — M170 Bilateral primary osteoarthritis of knee: Secondary | ICD-10-CM

## 2021-11-12 DIAGNOSIS — M170 Bilateral primary osteoarthritis of knee: Secondary | ICD-10-CM | POA: Diagnosis not present

## 2021-11-15 ENCOUNTER — Ambulatory Visit: Payer: Medicaid Other

## 2021-11-18 ENCOUNTER — Other Ambulatory Visit: Payer: Self-pay

## 2021-11-18 ENCOUNTER — Encounter (HOSPITAL_COMMUNITY): Payer: Self-pay | Admitting: Pharmacy Technician

## 2021-11-18 ENCOUNTER — Emergency Department (HOSPITAL_COMMUNITY)
Admission: EM | Admit: 2021-11-18 | Discharge: 2021-11-18 | Discharge status: Against Medical Advice | Payer: Medicaid Other | Attending: Emergency Medicine | Admitting: Emergency Medicine

## 2021-11-18 ENCOUNTER — Emergency Department (HOSPITAL_COMMUNITY): Payer: Medicaid Other

## 2021-11-18 DIAGNOSIS — M549 Dorsalgia, unspecified: Secondary | ICD-10-CM | POA: Diagnosis not present

## 2021-11-18 DIAGNOSIS — Z6841 Body Mass Index (BMI) 40.0 and over, adult: Secondary | ICD-10-CM | POA: Diagnosis not present

## 2021-11-18 DIAGNOSIS — G629 Polyneuropathy, unspecified: Secondary | ICD-10-CM | POA: Diagnosis not present

## 2021-11-18 DIAGNOSIS — M25562 Pain in left knee: Secondary | ICD-10-CM | POA: Insufficient documentation

## 2021-11-18 DIAGNOSIS — E039 Hypothyroidism, unspecified: Secondary | ICD-10-CM | POA: Diagnosis not present

## 2021-11-18 DIAGNOSIS — M545 Low back pain, unspecified: Secondary | ICD-10-CM | POA: Insufficient documentation

## 2021-11-18 DIAGNOSIS — I10 Essential (primary) hypertension: Secondary | ICD-10-CM | POA: Diagnosis not present

## 2021-11-18 DIAGNOSIS — E559 Vitamin D deficiency, unspecified: Secondary | ICD-10-CM | POA: Diagnosis not present

## 2021-11-18 DIAGNOSIS — Z79899 Other long term (current) drug therapy: Secondary | ICD-10-CM | POA: Diagnosis not present

## 2021-11-18 DIAGNOSIS — Z5321 Procedure and treatment not carried out due to patient leaving prior to being seen by health care provider: Secondary | ICD-10-CM | POA: Insufficient documentation

## 2021-11-18 DIAGNOSIS — M25561 Pain in right knee: Secondary | ICD-10-CM | POA: Diagnosis not present

## 2021-11-18 DIAGNOSIS — M1712 Unilateral primary osteoarthritis, left knee: Secondary | ICD-10-CM | POA: Diagnosis not present

## 2021-11-18 DIAGNOSIS — Z0130 Encounter for examination of blood pressure without abnormal findings: Secondary | ICD-10-CM | POA: Diagnosis not present

## 2021-11-18 DIAGNOSIS — M129 Arthropathy, unspecified: Secondary | ICD-10-CM | POA: Diagnosis not present

## 2021-11-18 DIAGNOSIS — G8929 Other chronic pain: Secondary | ICD-10-CM | POA: Insufficient documentation

## 2021-11-18 DIAGNOSIS — S39012A Strain of muscle, fascia and tendon of lower back, initial encounter: Secondary | ICD-10-CM | POA: Diagnosis not present

## 2021-11-18 NOTE — ED Triage Notes (Signed)
Pt states she has been having back pain and bil knee pain for months. Seen at [facility] and sent here for xrays. Pt also asking for chest xray due to having a cough "for a while".

## 2021-11-18 NOTE — ED Notes (Addendum)
No answer x3

## 2021-11-18 NOTE — ED Provider Triage Note (Signed)
Emergency Medicine Provider Triage Evaluation Note  Angela [name] , a 46 y.o. female  was evaluated in triage.  Pt complains of wanting x-rays.  She states that she was sent by her primary care provider at [facility] to a location to get x-rays.  Patient was never informed of the exact location so she came to the emergency department.  There were no had no address to go to either.  She is currently endorsing no new symptoms but only chronic bilateral knee pain and lower back pain.  Denies fall, twisting, trauma to affected areas.  Denies fever, saddle anesthesia, weakness, sensory deficits, IV drug use, cancer diagnosis, recent steroid use.   Review of Systems  Positive:  Negative:   Physical Exam  BP (!) 144/87 (BP Location: Right Arm)   Pulse 87   Temp 98.3 F (36.8 C) (Oral)   Resp (!) 22   SpO2 95%  Gen:   Awake, no distress   Resp:  Normal effort  MSK:   Moves extremities without difficulty  Other:  Difficult exam given patient's body habitus.  Bilateral knee tenderness as well as lumbar spine tenderness.  Medical Decision Making  Medically screening exam initiated at 5:35 PM.  Appropriate orders placed.  Angela [name] was informed that the remainder of the evaluation will be completed by another provider, this initial triage assessment does not replace that evaluation, and the importance of remaining in the ED until their evaluation is complete.     Angela [name], [place] 11/18/21 1737

## 2021-11-18 NOTE — ED Notes (Signed)
No answer x2 

## 2021-11-19 ENCOUNTER — Other Ambulatory Visit: Payer: Self-pay | Admitting: Pharmacist

## 2021-11-19 VITALS — BP 120/80

## 2021-11-19 DIAGNOSIS — I10 Essential (primary) hypertension: Secondary | ICD-10-CM

## 2021-11-19 NOTE — Chronic Care Management (AMB) (Signed)
Chief Complaint  Patient presents with   Hypertension    Angela [name] is a 46 y.o. year old female who presented for a virtual visit in the context of the COVID-19 pandemic.   They were referred to the pharmacist by a quality report for assistance in managing hypertension     Patient is participating in a Managed Medicaid Plan:  Yes  Subjective:  Care Team: Primary Care Provider: Ladell Pier, MD ; Next Scheduled Visit: 03/01/22   Medication Access/Adherence  Current Pharmacy:  [facility], [address] [place] [address] Phone: [phone] Fax: [phone]   Patient reports affordability concerns with their medications: No  Patient reports access/transportation concerns to their pharmacy: No  Patient reports adherence concerns with their medications:  No     Hypertension:  Current medications: amlopdipine 10 mg daily, HCTZ 25 mg daily, labetalol 300 mg daily  Patient has a validated, automated, upper arm home BP cuff Current blood pressure readings readings: controlled at <130/80  Patient denies hypotensive s/sx including dizziness, lightheadedness.  Patient denies hypertensive symptoms including headache, chest pain, shortness of breath  Referral placed for sleep study by PCP. Patient has not heard on this order yet.   Reports she saw Pain Management yesterday, they instructed her to go "somewhere" for imaging. She was unsure where, so she presented to the ED.   Health Maintenance  Health Maintenance Due  Topic Date Due   COVID-19 Vaccine (1) Never done   Hepatitis C Screening  Never done   PAP SMEAR-Modifier  02/16/2019     Objective: Lab Results  Component Value Date   HGBA1C 5.7 (H) 11/24/2020    Lab Results  Component Value Date   CREATININE 1.08 (H) 03/05/2021   BUN 14 03/05/2021   NA 137 03/05/2021   K 4.3 03/05/2021   CL  100 03/05/2021   CO2 23 03/05/2021    Lab Results  Component Value Date   CHOL 175 07/09/2021   HDL 41 07/09/2021   LDLCALC 115 (H) 07/09/2021   TRIG 104 07/09/2021   CHOLHDL 4.3 07/09/2021    Medications Reviewed Today     Reviewed by Gildardo Pounds, NP (Nurse Practitioner) on 10/16/21 at 1159  Med List Status: <None>   Medication Order Taking? Sig Documenting Provider Last Dose Status Informant  albuterol (PROVENTIL) (2.5 MG/3ML) 0.083% nebulizer solution [id number]  Take 3 mLs (2.5 mg total) by nebulization every 6 (six) hours as needed for wheezing or shortness of breath. Ladell Pier, MD  Active   albuterol (VENTOLIN HFA) 108 (90 Base) MCG/ACT inhaler [id number] No Inhale 1-2 puffs into the lungs every 6 (six) hours as needed for wheezing or shortness of breath. Ladell Pier, MD Taking Active   amLODipine (NORVASC) 10 MG tablet [id number] No TAKE 1 TABLET (10 MG TOTAL) BY MOUTH DAILY. Ladell Pier, MD Taking Active   apixaban (ELIQUIS) 5 MG TABS tablet [id number] No Take 1 tablet (5 mg total) by mouth 2 (two) times daily. Ladell Pier, MD Taking Active   diclofenac Sodium (VOLTAREN) 1 % GEL [id number] No Apply 2 g topically 4 (four) times daily. Ladell Pier, MD Taking Active   fluticasone [facility]) 50 MCG/ACT nasal spray [id number]  Place 1 spray into both nostrils daily. Ladell Pier, MD  Active   hydrochlorothiazide (HYDRODIURIL) 25 MG tablet [id number] Yes Take 1 tablet (25 mg total)  by mouth daily. Gildardo Pounds, NP  Active   labetalol (NORMODYNE) 300 MG tablet [id number] No Take 1 tablet (300 mg total) by mouth 2 (two) times daily. Ladell Pier, MD Taking Active   levothyroxine (SYNTHROID) 25 MCG tablet [id number] No TAKE 0.5 TABLETS (12.5 MCG TOTAL) BY MOUTH DAILY AT 6 (SIX) AM. Ladell Pier, MD Taking Active   loratadine (CLARITIN) 10 MG tablet [id number]  Take 1 tablet (10 mg total) by mouth daily. Ladell Pier, MD  Active    rosuvastatin (CRESTOR) 5 MG tablet [id number]  TAKE 1 TABLET (5 MG TOTAL) BY MOUTH DAILY. Ladell Pier, MD  Active   solifenacin (VESICARE) 5 MG tablet [id number] No Take 1 tablet (5 mg total) by mouth daily. For urine incontience Ladell Pier, MD Taking Active   triamcinolone cream (KENALOG) 0.1 % [id number]  Apply 1 application. topically 2 (two) times daily. Ladell Pier, MD  Active   Vitamin D, Ergocalciferol, (DRISDOL) 1.25 MG (50000 UNIT) CAPS capsule [id number] No Take 1 capsule (50,000 Units total) by mouth once a week. Ladell Pier, MD Taking Active               Assessment/Plan:   Hypertension: - Currently controlled - Reviewed appropriate blood pressure monitoring technique and reviewed goal blood pressure. Recommended to check home blood pressure and heart rate periodically - Encouraged to collaborate when called for sleep study. Reviewed impact of untreated sleep apnea on hypertension.  - Recommend to continue current regimen at this time - Assisted in scheduling follow up with PCP     Follow Up Plan: Follow up with PCP as scheduled  [name], PharmD, [facility] [phone]

## 2021-11-19 NOTE — Patient Instructions (Signed)
Angela [name],   Keep up the great work!  Call [facility] and let them know you had imaging done yesterday at the ED - the results may not automatically go to them.   Wait for a call regarding the sleep study. If you have not heard from that in a week, please call the office back.    Check your blood pressure twice weekly, and any time you have concerning symptoms like headache, chest pain, dizziness, shortness of breath, or vision changes.   Our goal is less than 130/80.  To appropriately check your blood pressure, make sure you do the following:  1) Avoid caffeine, exercise, or tobacco products for 30 minutes before checking. Empty your bladder. 2) Sit with your back supported in a flat-backed chair. Rest your arm on something flat (arm of the chair, table, etc). 3) Sit still with your feet flat on the floor, resting, for at least 5 minutes.  4) Check your blood pressure. Take 1-2 readings.  5) Write down these readings and bring with you to any provider appointments.  Bring your home blood pressure machine with you to a provider's office for accuracy comparison at least once a year.   Make sure you take your blood pressure medications before you come to any office visit, even if you were asked to fast for labs.  Take care!  Angela [name], PharmD, [facility] [phone]

## 2021-11-29 ENCOUNTER — Telehealth: Payer: Self-pay

## 2021-11-29 NOTE — Telephone Encounter (Signed)
-----   Message from Ladell Pier, MD sent at 11/27/2021 11:07 PM EDT ----- Please see pt Mychart message.  She is requesting that x-ray reports be sent to [facility].  She included the fax number.

## 2021-11-29 NOTE — Telephone Encounter (Signed)
Letter has been faxed.

## 2021-11-30 ENCOUNTER — Telehealth: Payer: Self-pay | Admitting: Internal Medicine

## 2021-11-30 ENCOUNTER — Encounter: Payer: Self-pay | Admitting: Internal Medicine

## 2021-11-30 DIAGNOSIS — R0683 Snoring: Secondary | ICD-10-CM

## 2021-11-30 NOTE — Telephone Encounter (Signed)
Rep w/ [place] sleep study making provider aware they received the referral but patient's insurance denied the claim  They will fax over a copy of the denial letter

## 2021-11-30 NOTE — Telephone Encounter (Signed)
I have received notification that patient's insurance denied in-house sleep study.  I will put her in for home sleep study instead.

## 2021-12-01 DIAGNOSIS — Z419 Encounter for procedure for purposes other than remedying health state, unspecified: Secondary | ICD-10-CM | POA: Diagnosis not present

## 2021-12-01 DIAGNOSIS — R32 Unspecified urinary incontinence: Secondary | ICD-10-CM | POA: Diagnosis not present

## 2021-12-03 NOTE — Telephone Encounter (Signed)
noted 

## 2021-12-05 ENCOUNTER — Ambulatory Visit: Payer: Medicaid Other

## 2021-12-05 DIAGNOSIS — Z91041 Radiographic dye allergy status: Secondary | ICD-10-CM | POA: Diagnosis not present

## 2021-12-05 DIAGNOSIS — Z86711 Personal history of pulmonary embolism: Secondary | ICD-10-CM | POA: Diagnosis not present

## 2021-12-05 DIAGNOSIS — J45909 Unspecified asthma, uncomplicated: Secondary | ICD-10-CM | POA: Diagnosis not present

## 2021-12-05 DIAGNOSIS — Z79899 Other long term (current) drug therapy: Secondary | ICD-10-CM | POA: Diagnosis not present

## 2021-12-05 DIAGNOSIS — E559 Vitamin D deficiency, unspecified: Secondary | ICD-10-CM | POA: Diagnosis not present

## 2021-12-05 DIAGNOSIS — M1990 Unspecified osteoarthritis, unspecified site: Secondary | ICD-10-CM | POA: Diagnosis not present

## 2021-12-05 DIAGNOSIS — E039 Hypothyroidism, unspecified: Secondary | ICD-10-CM | POA: Diagnosis not present

## 2021-12-05 DIAGNOSIS — E785 Hyperlipidemia, unspecified: Secondary | ICD-10-CM | POA: Diagnosis not present

## 2021-12-05 DIAGNOSIS — Z87892 Personal history of anaphylaxis: Secondary | ICD-10-CM | POA: Diagnosis not present

## 2021-12-05 DIAGNOSIS — I10 Essential (primary) hypertension: Secondary | ICD-10-CM | POA: Diagnosis not present

## 2021-12-05 DIAGNOSIS — R32 Unspecified urinary incontinence: Secondary | ICD-10-CM | POA: Diagnosis not present

## 2021-12-05 DIAGNOSIS — Z881 Allergy status to other antibiotic agents status: Secondary | ICD-10-CM | POA: Diagnosis not present

## 2021-12-05 DIAGNOSIS — L409 Psoriasis, unspecified: Secondary | ICD-10-CM | POA: Diagnosis not present

## 2021-12-05 DIAGNOSIS — Z6841 Body Mass Index (BMI) 40.0 and over, adult: Secondary | ICD-10-CM | POA: Diagnosis not present

## 2021-12-19 ENCOUNTER — Telehealth: Payer: Self-pay | Admitting: Internal Medicine

## 2021-12-26 ENCOUNTER — Encounter: Payer: Self-pay | Admitting: Internal Medicine

## 2021-12-26 NOTE — Progress Notes (Signed)
I received notice from [facility] that patient has been approved for the home sleep study.  Authorization number [id number].  Improved from 7/12 - 03/19/2022.  Will have CMA notify [facility] sleep study lab so that it can be mailed to patient.

## 2021-12-27 ENCOUNTER — Ambulatory Visit: Payer: Medicaid Other | Admitting: Student

## 2021-12-31 DIAGNOSIS — H6691 Otitis media, unspecified, right ear: Secondary | ICD-10-CM | POA: Diagnosis not present

## 2021-12-31 DIAGNOSIS — Z6841 Body Mass Index (BMI) 40.0 and over, adult: Secondary | ICD-10-CM | POA: Diagnosis not present

## 2021-12-31 DIAGNOSIS — E559 Vitamin D deficiency, unspecified: Secondary | ICD-10-CM | POA: Diagnosis not present

## 2021-12-31 DIAGNOSIS — J45909 Unspecified asthma, uncomplicated: Secondary | ICD-10-CM | POA: Diagnosis not present

## 2021-12-31 DIAGNOSIS — G8929 Other chronic pain: Secondary | ICD-10-CM | POA: Diagnosis not present

## 2021-12-31 DIAGNOSIS — Z0130 Encounter for examination of blood pressure without abnormal findings: Secondary | ICD-10-CM | POA: Diagnosis not present

## 2021-12-31 DIAGNOSIS — Z79899 Other long term (current) drug therapy: Secondary | ICD-10-CM | POA: Diagnosis not present

## 2021-12-31 DIAGNOSIS — M25561 Pain in right knee: Secondary | ICD-10-CM | POA: Diagnosis not present

## 2021-12-31 DIAGNOSIS — E7800 Pure hypercholesterolemia, unspecified: Secondary | ICD-10-CM | POA: Diagnosis not present

## 2021-12-31 DIAGNOSIS — I10 Essential (primary) hypertension: Secondary | ICD-10-CM | POA: Diagnosis not present

## 2021-12-31 DIAGNOSIS — E039 Hypothyroidism, unspecified: Secondary | ICD-10-CM | POA: Diagnosis not present

## 2021-12-31 DIAGNOSIS — G629 Polyneuropathy, unspecified: Secondary | ICD-10-CM | POA: Diagnosis not present

## 2022-01-01 DIAGNOSIS — R32 Unspecified urinary incontinence: Secondary | ICD-10-CM | POA: Diagnosis not present

## 2022-01-01 DIAGNOSIS — Z419 Encounter for procedure for purposes other than remedying health state, unspecified: Secondary | ICD-10-CM | POA: Diagnosis not present

## 2022-01-06 ENCOUNTER — Other Ambulatory Visit: Payer: Self-pay | Admitting: Internal Medicine

## 2022-01-06 DIAGNOSIS — E039 Hypothyroidism, unspecified: Secondary | ICD-10-CM

## 2022-01-06 DIAGNOSIS — N3946 Mixed incontinence: Secondary | ICD-10-CM

## 2022-01-06 DIAGNOSIS — I10 Essential (primary) hypertension: Secondary | ICD-10-CM

## 2022-01-06 DIAGNOSIS — E782 Mixed hyperlipidemia: Secondary | ICD-10-CM

## 2022-01-07 ENCOUNTER — Other Ambulatory Visit: Payer: Self-pay | Admitting: Internal Medicine

## 2022-01-07 DIAGNOSIS — E039 Hypothyroidism, unspecified: Secondary | ICD-10-CM

## 2022-01-07 DIAGNOSIS — E782 Mixed hyperlipidemia: Secondary | ICD-10-CM

## 2022-01-07 DIAGNOSIS — N3946 Mixed incontinence: Secondary | ICD-10-CM

## 2022-01-07 DIAGNOSIS — I10 Essential (primary) hypertension: Secondary | ICD-10-CM

## 2022-01-08 ENCOUNTER — Encounter: Payer: Self-pay | Admitting: Internal Medicine

## 2022-01-08 ENCOUNTER — Other Ambulatory Visit: Payer: Self-pay | Admitting: Internal Medicine

## 2022-01-08 DIAGNOSIS — H6690 Otitis media, unspecified, unspecified ear: Secondary | ICD-10-CM

## 2022-01-08 NOTE — Telephone Encounter (Signed)
Requested medication (s) are due for refill today: no  Requested medication (s) are on the active medication list:yes  Last refill:  01/07/22  Future visit scheduled: yes  Notes to clinic:  Unable to refill per protocol, last refill by provider 6/0/63, possible duplicate requests.     Requested Prescriptions  Pending Prescriptions Disp Refills   levothyroxine (SYNTHROID) 25 MCG tablet [Pharmacy Med Name: LEVOTHYROXINE 0.'025MG'$  (25MCG) TAB] 45 tablet     Sig: TAKE 1/2 TABLET(12.5 MCG) BY MOUTH DAILY AT 6 AM     Endocrinology:  Hypothyroid Agents Failed - 01/07/2022  5:28 PM      Failed - TSH in normal range and within 360 days    TSH  Date Value Ref Range Status  03/05/2021 8.100 (H) 0.450 - 4.500 uIU/mL Final         Passed - Valid encounter within last 12 months    Recent Outpatient Visits           2 months ago Loud snoring   [place], MD   2 months ago Primary hypertension   [place], Vernia Buff, NP   6 months ago [place] Ladell Pier, MD   9 months ago Mild intermittent asthmatic bronchitis without complication   [place], Deborah B, MD   10 months ago Essential hypertension   [place], MD       Future Appointments             In 1 month Ladell Pier, MD [place]             rosuvastatin (CRESTOR) 5 MG tablet [Pharmacy Med Name: ROSUVASTATIN '5MG'$  TABLETS] 90 tablet     Sig: TAKE 1 TABLET(5 MG) BY MOUTH DAILY** NEEDS OFFICE VISIT     Cardiovascular:  Antilipid - Statins 2 Failed - 01/07/2022  5:28 PM      Failed - Cr in normal range and within 360 days    Creatinine  Date Value Ref Range Status  12/30/2017 359.6 (H) 20.0 - 300.0 mg/dL Final   Creat  Date Value Ref Range Status  08/06/2016  1.12 (H) 0.50 - 1.10 mg/dL Final   Creatinine, Ser  Date Value Ref Range Status  03/05/2021 1.08 (H) 0.57 - 1.00 mg/dL Final         Failed - Lipid Panel in normal range within the last 12 months    Cholesterol, Total  Date Value Ref Range Status  07/09/2021 175 100 - 199 mg/dL Final   LDL Chol Calc (NIH)  Date Value Ref Range Status  07/09/2021 115 (H) 0 - 99 mg/dL Final   HDL  Date Value Ref Range Status  07/09/2021 41 >39 mg/dL Final   Triglycerides  Date Value Ref Range Status  07/09/2021 104 0 - 149 mg/dL Final         Passed - Patient is not pregnant      Passed - Valid encounter within last 12 months    Recent Outpatient Visits           2 months ago Loud snoring   [place], Deborah B, MD   2 months ago Primary hypertension   [place], Vernia Buff, NP   6  months ago [place] Ladell Pier, MD   9 months ago Mild intermittent asthmatic bronchitis without complication   [place] Ladell Pier, MD   10 months ago Essential hypertension   [place], MD       Future Appointments             In 1 month [place] Emery Dalbert Batman, MD [place]             solifenacin (VESICARE) 5 MG tablet [Pharmacy Med Name: SOLIFENACIN '5MG'$  TABLETS] 90 tablet     Sig: TAKE 1 TABLET BY MOUTH EVERY DAY(URINE INCONTINENCE)     Urology:  Bladder Agents 2 Failed - 01/07/2022  5:28 PM      Failed - Cr in normal range and within 360 days    Creatinine  Date Value Ref Range Status  12/30/2017 359.6 (H) 20.0 - 300.0 mg/dL Final   Creat  Date Value Ref Range Status  08/06/2016 1.12 (H) 0.50 - 1.10 mg/dL Final   Creatinine, Ser  Date Value Ref Range Status  03/05/2021 1.08 (H) 0.57 - 1.00 mg/dL Final         Passed - ALT in normal  range and within 360 days    ALT  Date Value Ref Range Status  07/09/2021 8 0 - 32 IU/L Final         Passed - AST in normal range and within 360 days    AST  Date Value Ref Range Status  07/09/2021 8 0 - 40 IU/L Final         Passed - Valid encounter within last 12 months    Recent Outpatient Visits           2 months ago Loud snoring   [place], Deborah B, MD   2 months ago Primary hypertension   [place], Vernia Buff, NP   6 months ago [place], Deborah B, MD   9 months ago Mild intermittent asthmatic bronchitis without complication   [place], Deborah B, MD   10 months ago Essential hypertension   [place], MD       Future Appointments             In 1 month Ladell Pier, MD [place]             loratadine (CLARITIN) 10 MG tablet [Pharmacy Med Name: LORATADINE '10MG'$  TABLETS] 90 tablet     Sig: TAKE 1 TABLET(10 MG) BY MOUTH DAILY     Ear, Nose, and Throat:  Antihistamines 2 Failed - 01/07/2022  5:28 PM      Failed - Cr in normal range and within 360 days    Creatinine  Date Value Ref Range Status  12/30/2017 359.6 (H) 20.0 - 300.0 mg/dL Final   Creat  Date Value Ref Range Status  08/06/2016 1.12 (H) 0.50 - 1.10 mg/dL Final   Creatinine, Ser  Date Value Ref Range Status  03/05/2021 1.08 (H) 0.57 - 1.00 mg/dL Final         Passed - Valid encounter within last 12 months    Recent Outpatient Visits  2 months ago Loud snoring   [place], MD   2 months ago Primary hypertension   [place], Vernia Buff, NP   6 months ago [place] Karle Plumber B, MD   9  months ago Mild intermittent asthmatic bronchitis without complication   [place], MD   10 months ago Essential hypertension   [place], MD       Future Appointments             In 1 month Ladell Pier, MD [place]             labetalol ([place]) 300 MG tablet [Pharmacy Med Name: LABETALOL '300MG'$  TABLETS] 180 tablet     Sig: TAKE 1 TABLET(300 MG) BY MOUTH TWICE DAILY     Cardiovascular:  Beta Blockers Passed - 01/07/2022  5:28 PM      Passed - Last BP in normal range    BP Readings from Last 1 Encounters:  11/19/21 120/80         Passed - Last Heart Rate in normal range    Pulse Readings from Last 1 Encounters:  11/18/21 87         Passed - Valid encounter within last 6 months    Recent Outpatient Visits           2 months ago Loud snoring   [place], Deborah B, MD   2 months ago Primary hypertension   [place], Vernia Buff, NP   6 months ago [place] Karle Plumber B, MD   9 months ago Mild intermittent asthmatic bronchitis without complication   [place], Deborah B, MD   10 months ago Essential hypertension   [place], MD       Future Appointments             In 1 month Ladell Pier, MD [place]             amLODipine (NORVASC) 10 MG tablet [Pharmacy Med Name: AMLODIPINE BESYLATE '10MG'$  TABLETS] 90 tablet     Sig: TAKE 1 TABLET(10 MG) BY MOUTH DAILY     Cardiovascular: Calcium Channel Blockers 2 Passed - 01/07/2022  5:28 PM      Passed - Last BP in normal range    BP Readings from Last 1 Encounters:  11/19/21 120/80         Passed - Last Heart Rate in normal  range    Pulse Readings from Last 1 Encounters:  11/18/21 87         Passed - Valid encounter within last 6 months    Recent Outpatient Visits           2 months ago Loud snoring   [place], Deborah B, MD   2 months ago Primary hypertension   [place], Vernia Buff, NP   6 months ago [place], MD   9 months ago Mild intermittent asthmatic bronchitis without complication  [place] Ladell Pier, MD   10 months ago Essential hypertension   [place], MD       Future Appointments             In 1 month [place] Emery Dalbert Batman, MD [place]

## 2022-01-09 ENCOUNTER — Encounter (INDEPENDENT_AMBULATORY_CARE_PROVIDER_SITE_OTHER): Payer: Self-pay

## 2022-02-01 DIAGNOSIS — R32 Unspecified urinary incontinence: Secondary | ICD-10-CM | POA: Diagnosis not present

## 2022-02-01 DIAGNOSIS — Z419 Encounter for procedure for purposes other than remedying health state, unspecified: Secondary | ICD-10-CM | POA: Diagnosis not present

## 2022-02-05 ENCOUNTER — Other Ambulatory Visit: Payer: Self-pay | Admitting: Internal Medicine

## 2022-02-05 DIAGNOSIS — E039 Hypothyroidism, unspecified: Secondary | ICD-10-CM

## 2022-02-05 DIAGNOSIS — N3946 Mixed incontinence: Secondary | ICD-10-CM

## 2022-02-05 DIAGNOSIS — I10 Essential (primary) hypertension: Secondary | ICD-10-CM

## 2022-02-06 ENCOUNTER — Other Ambulatory Visit: Payer: Self-pay | Admitting: Internal Medicine

## 2022-02-06 DIAGNOSIS — E039 Hypothyroidism, unspecified: Secondary | ICD-10-CM

## 2022-02-11 ENCOUNTER — Ambulatory Visit (HOSPITAL_BASED_OUTPATIENT_CLINIC_OR_DEPARTMENT_OTHER): Payer: Medicaid Other | Attending: Internal Medicine | Admitting: Internal Medicine

## 2022-02-11 VITALS — Ht 61.0 in | Wt 389.0 lb

## 2022-02-11 DIAGNOSIS — Z538 Procedure and treatment not carried out for other reasons: Secondary | ICD-10-CM

## 2022-02-14 ENCOUNTER — Other Ambulatory Visit: Payer: Self-pay | Admitting: Internal Medicine

## 2022-02-14 DIAGNOSIS — I10 Essential (primary) hypertension: Secondary | ICD-10-CM

## 2022-02-20 ENCOUNTER — Encounter: Payer: Self-pay | Admitting: Internal Medicine

## 2022-02-20 DIAGNOSIS — R0683 Snoring: Secondary | ICD-10-CM

## 2022-02-20 DIAGNOSIS — G4733 Obstructive sleep apnea (adult) (pediatric): Secondary | ICD-10-CM

## 2022-02-27 ENCOUNTER — Ambulatory Visit: Payer: Medicaid Other | Admitting: Student

## 2022-03-01 ENCOUNTER — Encounter: Payer: Self-pay | Admitting: Internal Medicine

## 2022-03-01 ENCOUNTER — Ambulatory Visit: Payer: Self-pay | Admitting: *Deleted

## 2022-03-01 ENCOUNTER — Ambulatory Visit: Payer: Medicaid Other | Attending: Internal Medicine | Admitting: Internal Medicine

## 2022-03-01 NOTE — Telephone Encounter (Signed)
  Chief Complaint: requesting to change OV to VV. Transportation issues and difficulty walking  Symptoms: bilateral knee pain leg pain, right knee pain greater than left , limping to walk.  Frequency: 2-3 days  Pertinent Negatives: Patient denies fever, no redness no swelling  Disposition: '[]'$ ED /'[]'$ Urgent Care (no appt availability in office) / '[x]'$ Appointment(In office/virtual)/ '[]'$  [place] [facility]/ '[]'$ Home Care/ '[]'$ Refused Recommended Disposition /'[]'$ [facility]/ '[]'$  Follow-up with PCP Additional Notes:   Patient requesting to change appt today at 2:10 to VV due to transportation issues. Please advise requesting a call back.      Reason for Disposition  [1] MODERATE pain (e.g., interferes with normal activities, limping) AND [2] present > 3 days  Answer Assessment - Initial Assessment Questions 1. ONSET: "When did the pain start?"      2-3 days  2. LOCATION: "Where is the pain located?"      Bilateral knees with right worse than the left  3. PAIN: "How bad is the pain?"    (Scale 1-10; or mild, moderate, severe)   -  MILD (1-3): doesn't interfere with normal activities    -  MODERATE (4-7): interferes with normal activities (e.g., work or school) or awakens from sleep, limping    -  SEVERE (8-10): excruciating pain, unable to do any normal activities, unable to walk     Moderate limping , sleeping with pain  4. WORK OR EXERCISE: "Has there been any recent work or exercise that involved this part of the body?"      No  5. CAUSE: "What do you think is causing the leg pain?"     Not sure  6. OTHER SYMPTOMS: "Do you have any other symptoms?" (e.g., chest pain, back pain, breathing difficulty, swelling, rash, fever, numbness, weakness)     No right knee pain  low back pain  7. PREGNANCY: "Is there any chance you are pregnant?" "When was your last menstrual period?"     na  Protocols used: Leg Pain-A-AH

## 2022-03-01 NOTE — Telephone Encounter (Signed)
Appointment has been changed.

## 2022-03-03 DIAGNOSIS — R32 Unspecified urinary incontinence: Secondary | ICD-10-CM | POA: Diagnosis not present

## 2022-03-03 DIAGNOSIS — Z419 Encounter for procedure for purposes other than remedying health state, unspecified: Secondary | ICD-10-CM | POA: Diagnosis not present

## 2022-03-05 ENCOUNTER — Ambulatory Visit: Payer: Medicaid Other | Attending: Internal Medicine | Admitting: Internal Medicine

## 2022-03-05 DIAGNOSIS — M170 Bilateral primary osteoarthritis of knee: Secondary | ICD-10-CM | POA: Diagnosis not present

## 2022-03-05 DIAGNOSIS — N3946 Mixed incontinence: Secondary | ICD-10-CM

## 2022-03-05 MED ORDER — DICLOFENAC SODIUM 1 % EX GEL: 2.0000 g | Freq: Four times a day (QID) | CUTANEOUS | 3 refills | Status: DC

## 2022-03-05 NOTE — Progress Notes (Signed)
Virtual Visit via Telephone Note  I connected with Angela [name] on 03/05/2022 at 1:32 PM by telephone and verified that I am speaking with the correct person using two identifiers  Location: Patient: home Provider: office  Participants: Myself Patient   I discussed the limitations, risks, security and privacy concerns of performing an evaluation and management service by telephone and the availability of in person appointments. I also discussed with the patient that there may be a patient responsible charge related to this service. The patient expressed understanding and agreed to proceed.   History of Present Illness: Hx of HTN, HL, morbid obesity, pre-DM, OA knees, hypothyroid, unprovoked BL PE, asthmatic bronchitis.  This is a forms visit to get her approve for incontinence supplies.  Incontinence of urine for over a yr Sometimes she is unable to hold urine until she gets to bathroom.    Endorses leakage of urine with coughing, sneezing and laughing.  She has had 5 vaginal births in the past.  She has incontinence of urine at nights as well.  RT knee has been hurting more. Referred to Pain Management with [facility].  Given Toradol and steroid shot in buttocks.  Did help.  Also Percocet was called in to her drug store but it needed preapproval.  She inform them of this but never heard back from [facility].  She would like for me to send a refill on Voltaren gel.  She wants to know what needs to be done for her to apply for disability.    Outpatient Encounter Medications as of 03/05/2022  Medication Sig   albuterol (PROVENTIL) (2.5 MG/3ML) 0.083% nebulizer solution Take 3 mLs (2.5 mg total) by nebulization every 6 (six) hours as needed for wheezing or shortness of breath.   albuterol (VENTOLIN HFA) 108 (90 Base) MCG/ACT inhaler Inhale 1-2 puffs into the lungs every 6 (six) hours as needed for wheezing or shortness of breath.   amLODipine (NORVASC) 10 MG  tablet TAKE 1 TABLET(10 MG) BY MOUTH DAILY   apixaban (ELIQUIS) 5 MG TABS tablet Take 1 tablet (5 mg total) by mouth 2 (two) times daily.   diclofenac Sodium (VOLTAREN) 1 % GEL Apply 2 g topically 4 (four) times daily.   fluticasone (FLONASE) 50 MCG/ACT nasal spray Place 1 spray into both nostrils daily.   hydrochlorothiazide (HYDRODIURIL) 25 MG tablet Take 1 tablet (25 mg total) by mouth daily.   labetalol (NORMODYNE) 300 MG tablet TAKE 1 TABLET(300 MG) BY MOUTH TWICE DAILY   levothyroxine (SYNTHROID) 25 MCG tablet TAKE 1/2 TABLET(12.5 MCG) BY MOUTH DAILY AT 6 AM   loratadine (CLARITIN) 10 MG tablet TAKE 1 TABLET(10 MG) BY MOUTH DAILY   rosuvastatin (CRESTOR) 5 MG tablet TAKE 1 TABLET(5 MG) BY MOUTH DAILY** NEEDS OFFICE VISIT   solifenacin (VESICARE) 5 MG tablet TAKE 1 TABLET BY MOUTH EVERY DAY(URINE INCONTINENCE)   triamcinolone cream (KENALOG) 0.1 % Apply 1 application. topically 2 (two) times daily.   Vitamin D, Ergocalciferol, (DRISDOL) 1.25 MG (50000 UNIT) CAPS capsule Take 1 capsule (50,000 Units total) by mouth once a week.   No facility-administered encounter medications on file as of 03/05/2022.      Observations/Objective: No direct observation done as this was a telephone visit.  Assessment and Plan: 1. Urinary incontinence, mixed Form will be completed and sent to [employer] for incontinence supplies.  2. Primary osteoarthritis of both knees Advised patient to call [facility] and request a follow-up appointment.  Advised  that she can apply for disability if she desires and they can request her records from [place].   Follow Up Instructions:    I discussed the assessment and treatment plan with the patient. The patient was provided an opportunity to ask questions and all were answered. The patient agreed with the plan and demonstrated an understanding of the instructions.   The patient was advised to call back or seek an in-person evaluation if the symptoms worsen or if  the condition fails to improve as anticipated.  I  Spent 9 minutes on this telephone encounter  This note has been created with Surveyor, quantity. Any transcriptional errors are unintentional.  Karle Plumber, MD

## 2022-03-07 ENCOUNTER — Other Ambulatory Visit: Payer: Self-pay | Admitting: Internal Medicine

## 2022-03-07 DIAGNOSIS — E039 Hypothyroidism, unspecified: Secondary | ICD-10-CM

## 2022-03-07 NOTE — Telephone Encounter (Signed)
Requested Prescriptions  Pending Prescriptions Disp Refills  . levothyroxine (SYNTHROID) 25 MCG tablet [Pharmacy Med Name: LEVOTHYROXINE 0.'025MG'$  (25MCG) TAB] 45 tablet 0    Sig: TAKE 1/2 TABLET(12.5 MCG) BY MOUTH DAILY AT 6 AM     Endocrinology:  Hypothyroid Agents Failed - 03/07/2022  6:34 AM      Failed - TSH in normal range and within 360 days    TSH  Date Value Ref Range Status  03/05/2021 8.100 (H) 0.450 - 4.500 uIU/mL Final         Passed - Valid encounter within last 12 months    Recent Outpatient Visits          2 days ago Urinary incontinence, mixed   [place], Deborah B, MD   4 months ago Loud snoring   [place], Deborah B, MD   4 months ago Primary hypertension   [place], Vernia Buff, NP   8 months ago [place] Ladell Pier, MD   11 months ago Mild intermittent asthmatic bronchitis without complication   [place] Ladell Pier, MD

## 2022-03-15 ENCOUNTER — Other Ambulatory Visit: Payer: Self-pay | Admitting: Internal Medicine

## 2022-03-15 DIAGNOSIS — Z86711 Personal history of pulmonary embolism: Secondary | ICD-10-CM

## 2022-03-20 ENCOUNTER — Encounter: Payer: Self-pay | Admitting: Internal Medicine

## 2022-03-20 ENCOUNTER — Other Ambulatory Visit: Payer: Self-pay | Admitting: Internal Medicine

## 2022-03-20 DIAGNOSIS — R400 Somnolence: Secondary | ICD-10-CM

## 2022-03-20 DIAGNOSIS — R0683 Snoring: Secondary | ICD-10-CM

## 2022-03-20 NOTE — Progress Notes (Signed)
I received notification from [facility] that in-house sleep study was denied by patient's [employer].  The reason it was denied is because they need the results of the prior study or test that showed the need for repeat testing.  I then called [facility] and inquired whether they have a report from the patient's home sleep study.  I was told by Coralyn Mark that they do not.  Because it only recorded about 2 hours, they did not have the download.  She states that they will reach out to patient's insurance to try to get her approved again for the home sleep study and hopefully patient can complete the entire study so that it can be downloaded to justify an in center sleep study if needed.  She told me to go ahead and enter the order for the home sleep study again because the prior approval period for the last one I did expired yesterday.

## 2022-03-28 ENCOUNTER — Other Ambulatory Visit: Payer: Self-pay | Admitting: Internal Medicine

## 2022-03-28 DIAGNOSIS — E782 Mixed hyperlipidemia: Secondary | ICD-10-CM

## 2022-04-03 ENCOUNTER — Encounter: Payer: Self-pay | Admitting: Internal Medicine

## 2022-04-03 DIAGNOSIS — Z419 Encounter for procedure for purposes other than remedying health state, unspecified: Secondary | ICD-10-CM | POA: Diagnosis not present

## 2022-04-04 ENCOUNTER — Ambulatory Visit: Payer: Medicaid Other | Attending: Internal Medicine | Admitting: Internal Medicine

## 2022-04-04 DIAGNOSIS — J45909 Unspecified asthma, uncomplicated: Secondary | ICD-10-CM

## 2022-04-04 DIAGNOSIS — J209 Acute bronchitis, unspecified: Secondary | ICD-10-CM | POA: Diagnosis not present

## 2022-04-04 MED ORDER — AZITHROMYCIN 250 MG PO TABS: ORAL_TABLET | ORAL | 0 refills | Status: DC

## 2022-04-04 MED ORDER — PREDNISONE 20 MG PO TABS: ORAL_TABLET | ORAL | 0 refills | Status: DC

## 2022-04-04 MED ORDER — BENZONATATE 200 MG PO CAPS: 200.0000 mg | ORAL_CAPSULE | Freq: Two times a day (BID) | ORAL | 0 refills | Status: DC | PRN

## 2022-04-04 NOTE — Progress Notes (Signed)
Patient ID: Angela [name], female   DOB: 03-Jun-1976, 46 y.o.   MRN: [id number] Virtual Visit via Telephone Note  I connected with Angela [name] on 04/04/2022 at 12:24 p.m by telephone and verified that I am speaking with the correct person using two identifiers  Location: Patient: home Provider: office  Participants: Myself Patient   I discussed the limitations, risks, security and privacy concerns of performing an evaluation and management service by telephone and the availability of in person appointments. I also discussed with the patient that there may be a patient responsible charge related to this service. The patient expressed understanding and agreed to proceed.   History of Present Illness: Hx of HTN, HL, morbid obesity, pre-DM, OA knees, hypothyroid, unprovoked BL PE, asthmatic bronchitis.   This is an urgent care visit.  Patient complains of cough productive of yellow-brown phlegm, shortness of breath, wheezing and some rhinorrhea x3 days.  No fever, body aches or loss of taste/smell.  Her grandson was sick last week.  She has been using Delsym cough syrup without much relief.  She has also had to use her nebulizer about 4 times since symptoms began.    Outpatient Encounter Medications as of 04/04/2022  Medication Sig   albuterol (PROVENTIL) (2.5 MG/3ML) 0.083% nebulizer solution Take 3 mLs (2.5 mg total) by nebulization every 6 (six) hours as needed for wheezing or shortness of breath.   albuterol (VENTOLIN HFA) 108 (90 Base) MCG/ACT inhaler Inhale 1-2 puffs into the lungs every 6 (six) hours as needed for wheezing or shortness of breath.   amLODipine (NORVASC) 10 MG tablet TAKE 1 TABLET(10 MG) BY MOUTH DAILY   diclofenac Sodium (VOLTAREN) 1 % GEL Apply 2 g topically 4 (four) times daily.   ELIQUIS 5 MG TABS tablet TAKE 1 TABLET(5 MG) BY MOUTH TWICE DAILY   fluticasone (FLONASE) 50 MCG/ACT nasal spray Place 1 spray into both nostrils daily.   hydrochlorothiazide  (HYDRODIURIL) 25 MG tablet Take 1 tablet (25 mg total) by mouth daily.   labetalol (NORMODYNE) 300 MG tablet TAKE 1 TABLET(300 MG) BY MOUTH TWICE DAILY   levothyroxine (SYNTHROID) 25 MCG tablet TAKE 1/2 TABLET(12.5 MCG) BY MOUTH DAILY AT 6 AM   loratadine (CLARITIN) 10 MG tablet TAKE 1 TABLET(10 MG) BY MOUTH DAILY   rosuvastatin (CRESTOR) 5 MG tablet TAKE 1 TABLET(5 MG) BY MOUTH DAILY** NEEDS OFFICE VISIT   solifenacin (VESICARE) 5 MG tablet TAKE 1 TABLET BY MOUTH EVERY DAY(URINE INCONTINENCE)   triamcinolone cream (KENALOG) 0.1 % Apply 1 application. topically 2 (two) times daily.   Vitamin D, Ergocalciferol, (DRISDOL) 1.25 MG (50000 UNIT) CAPS capsule Take 1 capsule (50,000 Units total) by mouth once a week.   No facility-administered encounter medications on file as of 04/04/2022.      Observations/Objective: No direct observation done as this was a telephone visit.  Patient with mild congestion.  She is talking in full sentences.  Assessment and Plan: 1. Bronchitis with asthma, acute Advised to do a home COVID test to make sure she is not COVID-positive. We will give a course of Zithromax, prednisone and cough tablets.  Follow-up if no improvement or any worsening of symptoms. - azithromycin (ZITHROMAX Z-PAK) 250 MG tablet; 2 tabs Po daily x 1 day then 1 tab Po daily  Dispense: 6 each; Refill: 0 - predniSONE (DELTASONE) 20 MG tablet; 1 tab PO daily x 2 days then 1/2 tab PO x 2 days  Dispense: 3 tablet; Refill: 0 - benzonatate (TESSALON) 200  MG capsule; Take 1 capsule (200 mg total) by mouth 2 (two) times daily as needed for cough.  Dispense: 20 capsule; Refill: 0   Follow Up Instructions: As needed   I discussed the assessment and treatment plan with the patient. The patient was provided an opportunity to ask questions and all were answered. The patient agreed with the plan and demonstrated an understanding of the instructions.   The patient was advised to call back or seek an  in-person evaluation if the symptoms worsen or if the condition fails to improve as anticipated.  I  Spent 6 minutes on this telephone encounter  This note has been created with Surveyor, quantity. Any transcriptional errors are unintentional.  Karle Plumber, MD

## 2022-04-05 DIAGNOSIS — R32 Unspecified urinary incontinence: Secondary | ICD-10-CM | POA: Diagnosis not present

## 2022-04-08 ENCOUNTER — Ambulatory Visit (HOSPITAL_BASED_OUTPATIENT_CLINIC_OR_DEPARTMENT_OTHER): Payer: Medicaid Other | Attending: Internal Medicine | Admitting: Internal Medicine

## 2022-04-16 ENCOUNTER — Emergency Department (HOSPITAL_COMMUNITY): Payer: Medicaid Other

## 2022-04-16 ENCOUNTER — Ambulatory Visit
Admission: EM | Admit: 2022-04-16 | Discharge: 2022-04-16 | Disposition: A | Discharge status: Other Acute Inpatient Hospital | Payer: Medicaid Other | Attending: Internal Medicine | Admitting: Internal Medicine

## 2022-04-16 ENCOUNTER — Encounter (HOSPITAL_COMMUNITY): Payer: Self-pay | Admitting: Emergency Medicine

## 2022-04-16 ENCOUNTER — Emergency Department (HOSPITAL_COMMUNITY)
Admission: EM | Admit: 2022-04-16 | Discharge: 2022-04-17 | Discharge status: Against Medical Advice | Payer: Medicaid Other | Attending: Student | Admitting: Student

## 2022-04-16 ENCOUNTER — Other Ambulatory Visit: Payer: Self-pay

## 2022-04-16 DIAGNOSIS — R0789 Other chest pain: Secondary | ICD-10-CM | POA: Diagnosis not present

## 2022-04-16 DIAGNOSIS — R0602 Shortness of breath: Secondary | ICD-10-CM | POA: Diagnosis not present

## 2022-04-16 DIAGNOSIS — R051 Acute cough: Secondary | ICD-10-CM

## 2022-04-16 DIAGNOSIS — N9489 Other specified conditions associated with female genital organs and menstrual cycle: Secondary | ICD-10-CM | POA: Diagnosis not present

## 2022-04-16 DIAGNOSIS — R059 Cough, unspecified: Secondary | ICD-10-CM | POA: Diagnosis not present

## 2022-04-16 DIAGNOSIS — Z5321 Procedure and treatment not carried out due to patient leaving prior to being seen by health care provider: Secondary | ICD-10-CM | POA: Diagnosis not present

## 2022-04-16 DIAGNOSIS — R079 Chest pain, unspecified: Secondary | ICD-10-CM | POA: Diagnosis not present

## 2022-04-16 DIAGNOSIS — R0682 Tachypnea, not elsewhere classified: Secondary | ICD-10-CM

## 2022-04-16 DIAGNOSIS — R062 Wheezing: Secondary | ICD-10-CM | POA: Diagnosis not present

## 2022-04-16 MED ORDER — IPRATROPIUM-ALBUTEROL 0.5-2.5 (3) MG/3ML IN SOLN
3.0000 mL | Freq: Once | RESPIRATORY_TRACT | Status: AC
  Administered 2022-04-16: 3 mL via RESPIRATORY_TRACT

## 2022-04-16 NOTE — Discharge Instructions (Signed)
Please go to the emergency department as soon as you leave urgent care for further evaluation and management. ?

## 2022-04-16 NOTE — ED Triage Notes (Signed)
Pt presents to uc with co of  L sided rib pain for one week and new onset of R sided otalgia. Pt reports sob for one week, spo2 on ra 87

## 2022-04-16 NOTE — ED Provider Triage Note (Signed)
Emergency Medicine Provider Triage Evaluation Note  Angela [name] , a 46 y.o. female  was evaluated in triage.  Pt complains of cough, shortness of breath, wheezing, and left-sided rib pain for the past week and a half.  History of asthma.  Patient evaluated at urgent care prior to arrival and sent to the ED due to hypoxia.  Patient has a history of PE currently on Eliquis which she has been compliant with.  Review of Systems  Positive: SOB Negative: fever  Physical Exam  BP (!) 179/95 (BP Location: Left Arm)   Pulse 91   Temp 98.1 F (36.7 C) (Oral)   Resp 20   Ht '5\' 1"'$  (1.549 m)   Wt (!) 176.9 kg   SpO2 97%   BMI 73.69 kg/m  Gen:   Awake, no distress   Resp:  Normal effort  MSK:   Moves extremities without difficulty  Other:    Medical Decision Making  Medically screening exam initiated at 11:27 PM.  Appropriate orders placed.  V[facility] was informed that the remainder of the evaluation will be completed by another provider, this initial triage assessment does not replace that evaluation, and the importance of remaining in the ED until their evaluation is complete.  Labs CXR COVID/flu   Karie Kirks 04/16/22 2328

## 2022-04-16 NOTE — ED Notes (Signed)
Patient is being discharged from the Urgent Care and sent to the Emergency Department via pov . Per [place] np, patient is in need of higher level of care due to sob and tachypnea . Patient is aware and verbalizes understanding of plan of care.  Vitals:   04/16/22 1900  BP: (!) 179/139  Pulse: 94  Resp: (!) 22  Temp: 98.7 F (37.1 C)  SpO2: 96%

## 2022-04-16 NOTE — ED Triage Notes (Addendum)
Pt sent over from UC with c/o left sided rib pain with cough x 2 weeks and new right sided otalgia. Pt received a duoneb at [facility]

## 2022-04-16 NOTE — ED Provider Notes (Addendum)
[facility]    CSN: [id number] Arrival date & time: 04/16/22  1748      History   Chief Complaint Chief Complaint  Patient presents with   Otalgia   Shortness of Breath    HPI Angela [name] is a 46 y.o. female.   Patient presents with persistent cough, shortness of breath, wheezing, left-sided rib pain, bilateral ear pain.  Patient reports that the symptoms have been present for about 1-1.5 weeks.  She reports she has history of asthma and has been using albuterol inhaler with minimal improvement.  reports intermittent shortness of breath.  She developed some left-sided rib pain as well.  Denies chest pain, sore throat, nausea, vomiting, diarrhea, abdominal pain.  Patient reports that she was seen by PCP when symptoms first started and given azithromycin which she has completed.  Patient reports that she has not taken any of her blood pressure medication today.  Denies headache, dizziness, nausea, vomiting.   Otalgia Shortness of Breath   Past Medical History:  Diagnosis Date   Allergies    Asthma    Back pain    Depression    hx, doing ok now   Edema, lower extremity    GERD (gastroesophageal reflux disease)    Headache(784.0)    Heart murmur    Hypertension    on meds   IBS (irritable bowel syndrome)    Joint pain    Major depressive disorder, recurrent episode, moderate ([place]) 05/16/2020   Neuropathy    Obesity    Osteoarthritis    Ovarian cyst    Prediabetes    Preterm labor    Vitamin D deficiency     Patient Active Problem List   Diagnosis Date Noted   Vitamin D deficiency 07/09/2021   Major depressive disorder, recurrent episode, moderate ([place]) 05/16/2020   AKI (acute kidney injury) ([place]) 12/31/2019   Asthma 12/31/2019   Acquired hypothyroidism 12/31/2019   Acute pulmonary embolism ([place]) 12/30/2019   Essential hypertension 07/08/2018   Morbid obesity ([place]) 07/08/2018   Neuropathy of both feet 07/08/2018   Primary osteoarthritis of  both knees 04/02/2018   Foot pain, right 04/02/2018   Chronic maxillary sinusitis 09/18/2017   HTN (hypertension), benign 12/17/2016   Pain, pelvic, female 02/05/2012   Fibroids 02/05/2012   Abnormal uterine bleeding 02/05/2012    Past Surgical History:  Procedure Laterality Date   stye removal   Section    OB History     Gravida  7   Para  5   Term  3   Preterm  2   AB  2   Living  5      SAB  2   IAB  0   Ectopic  0   Multiple  0   Live Births  5            Home Medications    Prior to Admission medications   Medication Sig Start Date End Date Taking? Authorizing Provider  albuterol (PROVENTIL) (2.5 MG/3ML) 0.083% nebulizer solution Take 3 mLs (2.5 mg total) by nebulization every 6 (six) hours as needed for wheezing or shortness of breath. 11/06/21   Ladell Pier, MD  albuterol (VENTOLIN HFA) 108 (90 Base) MCG/ACT inhaler Inhale 1-2 puffs into the lungs every 6 (six) hours as needed for wheezing or shortness of breath. 07/09/21   Ladell Pier, MD  amLODipine (NORVASC) 10 MG tablet TAKE 1 TABLET(10 MG) BY MOUTH  DAILY 02/14/22   Ladell Pier, MD  azithromycin (ZITHROMAX Z-PAK) 250 MG tablet 2 tabs Po daily x 1 day then 1 tab Po daily 04/04/22   Ladell Pier, MD  benzonatate (TESSALON) 200 MG capsule Take 1 capsule (200 mg total) by mouth 2 (two) times daily as needed for cough. 04/04/22   Ladell Pier, MD  diclofenac Sodium (VOLTAREN) 1 % GEL Apply 2 g topically 4 (four) times daily. 03/05/22   Ladell Pier, MD  ELIQUIS 5 MG TABS tablet TAKE 1 TABLET(5 MG) BY MOUTH TWICE DAILY 03/15/22   Ladell Pier, MD  fluticasone [facility]) 50 MCG/ACT nasal spray Place 1 spray into both nostrils daily. 10/02/21   Ladell Pier, MD  hydrochlorothiazide (HYDRODIURIL) 25 MG tablet Take 1 tablet (25 mg total) by mouth daily. 10/16/21   Gildardo Pounds, NP  labetalol (NORMODYNE) 300 MG tablet TAKE 1 TABLET(300 MG) BY  MOUTH TWICE DAILY 02/14/22   Ladell Pier, MD  levothyroxine (SYNTHROID) 25 MCG tablet TAKE 1/2 TABLET(12.5 MCG) BY MOUTH DAILY AT 6 AM 03/07/22   Ladell Pier, MD  loratadine (CLARITIN) 10 MG tablet TAKE 1 TABLET(10 MG) BY MOUTH DAILY 01/09/22   Ladell Pier, MD  predniSONE (DELTASONE) 20 MG tablet 1 tab PO daily x 2 days then 1/2 tab PO x 2 days 04/04/22   Ladell Pier, MD  rosuvastatin (CRESTOR) 5 MG tablet TAKE 1 TABLET(5 MG) BY MOUTH DAILY** NEEDS OFFICE VISIT 03/28/22   Ladell Pier, MD  solifenacin (VESICARE) 5 MG tablet TAKE 1 TABLET BY MOUTH EVERY DAY(URINE INCONTINENCE) 01/09/22   Ladell Pier, MD  triamcinolone cream (KENALOG) 0.1 % Apply 1 application. topically 2 (two) times daily. 10/02/21   Ladell Pier, MD  Vitamin D, Ergocalciferol, (DRISDOL) 1.25 MG (50000 UNIT) CAPS capsule Take 1 capsule (50,000 Units total) by mouth once a week. 03/07/21   Ladell Pier, MD    Family History Family History  Problem Relation Age of Onset   Hyperlipidemia Mother    Hypertension Mother    Thyroid disease Mother    Obesity Mother    Clotting disorder Mother    Cancer Son    Hypertension Other    Diabetes Other    Other Neg Hx     Social History Social History   Tobacco Use   Smoking status: Never   Smokeless tobacco: Never  Vaping Use   Vaping Use: Never used  Substance Use Topics   Alcohol use: No   Drug use: No     Allergies   Doxycycline and Flagyl [metronidazole hcl]   Review of Systems Review of Systems Per HPI  Physical Exam Triage Vital Signs ED Triage Vitals  Enc Vitals Group     BP 04/16/22 1900 (!) 179/139     Pulse Rate 04/16/22 1900 94     Resp 04/16/22 1900 (!) 22     Temp 04/16/22 1900 98.7 F (37.1 C)     Temp Source 04/16/22 1900 Oral     SpO2 04/16/22 1900 96 %     Weight --      Height --      Head Circumference --      Peak Flow --      Pain Score 04/16/22 1859 0     Pain Loc --      Pain Edu? --       Excl. in [place]? --    No data  found.  Updated Vital Signs BP (!) 179/139 (BP Location: Left Arm)   Pulse 94   Temp 98.7 F (37.1 C) (Oral)   Resp (!) 22   SpO2 96% Comment: Simultaneous filing. User may not have seen previous data.  Visual Acuity Right Eye Distance:   Left Eye Distance:   Bilateral Distance:    Right Eye Near:   Left Eye Near:    Bilateral Near:     Physical Exam Constitutional:      General: She is not in acute distress.    Appearance: Normal appearance. She is not toxic-appearing or diaphoretic.  HENT:     Head: Normocephalic and atraumatic.     Ears:     Comments: Deferred given tachypnea evaluation.    Nose: Congestion present.     Mouth/Throat:     Comments: Deferred given tachypnea evaluation. Cardiovascular:     Rate and Rhythm: Normal rate and regular rhythm.     Pulses: Normal pulses.     Heart sounds: Normal heart sounds.  Pulmonary:     Effort: No respiratory distress.     Breath sounds: Wheezing present.     Comments: Audible wheezing noted. Tachypnea noted.  Abdominal:     General: Abdomen is flat. Bowel sounds are normal.     Palpations: Abdomen is soft.  Musculoskeletal:        General: Normal range of motion.     Cervical back: Normal range of motion.  Skin:    General: Skin is warm and dry.  Neurological:     General: No focal deficit present.     Mental Status: She is alert and oriented to person, place, and time. Mental status is at baseline.  Psychiatric:        Mood and Affect: Mood normal.        Behavior: Behavior normal.      UC Treatments / Results  Labs (all labs ordered are listed, but only abnormal results are displayed) Labs Reviewed - No data to display  EKG   Radiology No results found.  Procedures Procedures (including critical care time)  Medications Ordered in UC Medications  ipratropium-albuterol (DUONEB) 0.5-2.5 (3) MG/3ML nebulizer solution 3 mL (3 mLs Nebulization Given 04/16/22 1921)     Initial Impression / Assessment and Plan / UC Course  I have reviewed the triage vital signs and the nursing notes.  Pertinent labs & imaging results that were available during my care of the patient were reviewed by me and considered in my medical decision making (see chart for details).     Patient had increased work of breathing, wheezing, tachypnea, oxygen saturation of 87 to 88% on initial triage and physical exam.  Blood pressure was also significantly elevated.  DuoNeb administered in urgent care with minimal improvement in oxygen saturation to 93 and patient still tachypneic with audible wheezing.  Given minimal improvement in patient's status, she was advised that it would be best to go to the emergency department for further evaluation and management given limited resources here in urgent care.  Patient was agreeable with plan.  Suggested EMS transport but patient declined.  Risks associated with not going by EMS were discussed with patient.  Patient voiced understanding and wishes to self transport.  Evaluation of ears were deferred given tachypnea as priority.  Patient advised to notify ER provider that she has otalgia. Final Clinical Impressions(s) / UC Diagnoses   Final diagnoses:  Tachypnea  Wheezing  Acute cough  Discharge Instructions      Please go to the emergency department as soon as you leave urgent care for further evaluation and management.    ED Prescriptions   None    PDMP not reviewed this encounter.   Teodora Medici, [place] 04/16/22 1940    Teodora Medici, [place] 04/16/22 1940

## 2022-04-16 NOTE — ED Notes (Signed)
Unsuccessful lab draw attempt x2

## 2022-04-17 LAB — CBC WITH DIFFERENTIAL/PLATELET
Abs Immature Granulocytes: 0.02 10*3/uL (ref 0.00–0.07)
Basophils Absolute: 0 10*3/uL (ref 0.0–0.1)
Basophils Relative: 0 %
Eosinophils Absolute: 0.2 10*3/uL (ref 0.0–0.5)
Eosinophils Relative: 3 %
HCT: 38.6 % (ref 36.0–46.0)
Hemoglobin: 11.1 g/dL — ABNORMAL LOW (ref 12.0–15.0)
Immature Granulocytes: 0 %
Lymphocytes Relative: 17 %
Lymphs Abs: 1.4 10*3/uL (ref 0.7–4.0)
MCH: 23 pg — ABNORMAL LOW (ref 26.0–34.0)
MCHC: 28.8 g/dL — ABNORMAL LOW (ref 30.0–36.0)
MCV: 79.9 fL — ABNORMAL LOW (ref 80.0–100.0)
Monocytes Absolute: 0.6 10*3/uL (ref 0.1–1.0)
Monocytes Relative: 7 %
Neutro Abs: 6 10*3/uL (ref 1.7–7.7)
Neutrophils Relative %: 73 %
Platelets: 416 10*3/uL — ABNORMAL HIGH (ref 150–400)
RBC: 4.83 MIL/uL (ref 3.87–5.11)
RDW: 15.3 % (ref 11.5–15.5)
WBC: 8.3 10*3/uL (ref 4.0–10.5)
nRBC: 0 % (ref 0.0–0.2)

## 2022-04-17 LAB — BASIC METABOLIC PANEL
Anion gap: 8 (ref 5–15)
BUN: 16 mg/dL (ref 6–20)
CO2: 27 mmol/L (ref 22–32)
Calcium: 9.5 mg/dL (ref 8.9–10.3)
Chloride: 104 mmol/L (ref 98–111)
Creatinine, Ser: 1.22 mg/dL — ABNORMAL HIGH (ref 0.44–1.00)
GFR, Estimated: 55 mL/min — ABNORMAL LOW (ref >60)
Glucose, Bld: 114 mg/dL — ABNORMAL HIGH (ref 70–99)
Potassium: 4.1 mmol/L (ref 3.5–5.1)
Sodium: 139 mmol/L (ref 135–145)

## 2022-04-17 LAB — HCG, QUANTITATIVE, PREGNANCY: hCG, Beta Chain, Quant, S: <1 m[IU]/mL (ref <5)

## 2022-04-17 NOTE — ED Provider Notes (Addendum)
I reassessed this patient from triage based on elevated heart rate found in the waiting room to 143.  Patient is notably short of breath walking to the room.  She tells me that she has had left-sided rib cage pain for the past week worse with breathing.  He also tells me that she has missed a couple of doses of her Eliquis in the past week.  Says she had a productive cough with brown sputum.  When she was urgent care yesterday, it looks like she was found to be hypoxic down into the mid 80s and responded to nebulizer treatment.    When I listened to her today, she does not sound wheezy at all but does have diminished lung sounds. Her heart rate has improved once she sat back down and hypoxia has resolved but has been down as low as 92% in the room. There is clinical concern for possible blood clot versus occult pneumonia that was not seen on chest x-ray.  I have ordered CTA chest to work this up.   She is also having some left ear discomfort. I have assessed her ears and she does not appear to have any abnormalities suggestive of infection.  Patient however, is concerned regarding the wait time and refuses COVID test as well as further workup.    Adolphus Birchwood, PA-C 04/17/22 0759    Adolphus Birchwood, PA-C 04/17/22 0801    Regan Lemming, MD 04/17/22 1056

## 2022-04-17 NOTE — ED Notes (Addendum)
Pt refused the Resp Panel Nasal Swab.

## 2022-04-17 NOTE — ED Notes (Signed)
Went in room to start IV and patient was gone, called from waiting room with no answer, checked bathrooms. Mentioned to PA she didn't want to stay.

## 2022-04-22 ENCOUNTER — Encounter: Payer: Self-pay | Admitting: Internal Medicine

## 2022-05-01 ENCOUNTER — Ambulatory Visit: Payer: Medicaid Other | Admitting: Obstetrics and Gynecology

## 2022-05-03 DIAGNOSIS — Z419 Encounter for procedure for purposes other than remedying health state, unspecified: Secondary | ICD-10-CM | POA: Diagnosis not present

## 2022-05-06 ENCOUNTER — Other Ambulatory Visit: Payer: Self-pay | Admitting: Internal Medicine

## 2022-05-06 DIAGNOSIS — N3946 Mixed incontinence: Secondary | ICD-10-CM

## 2022-05-06 DIAGNOSIS — E039 Hypothyroidism, unspecified: Secondary | ICD-10-CM

## 2022-05-10 ENCOUNTER — Ambulatory Visit: Payer: Self-pay

## 2022-05-10 DIAGNOSIS — R32 Unspecified urinary incontinence: Secondary | ICD-10-CM | POA: Diagnosis not present

## 2022-05-10 NOTE — Telephone Encounter (Signed)
Message from Shan Levans sent at 05/10/2022  1:36 PM EST  Summary: ear ache   Patient states that she has had a cough, congestion and ear ache for about a month. No appointments available.         Chief Complaint: cough, earache (right ear) Symptoms: coughing up brown phlegm, wheezing, ? Post nasal drip, runny nose, frequent throat clearing, right ear ache with intermittent severe pain Frequency: 1 month Pertinent Negatives: Patient denies chest pain, fever Disposition: '[]'$ ED /'[x]'$ Urgent Care (no appt availability in office) / '[]'$ Appointment(In office/virtual)/ '[]'$  [facility]/ '[]'$ Home Care/ '[]'$ Refused Recommended Disposition /'[]'$ [facility]/ '[]'$  Follow-up with PCP Additional Notes: pt using nebulizer which alleviates her wheezing. Pt stated the brown phlegm comes uo with hard deep coughing. No appts available today. Discussed Urgent Care and if needs to verify that is where she needs to go to do a Virtual Video Visit.  Reason for Disposition  Earache is present  Answer Assessment - Initial Assessment Questions 1. ONSET: "When did the cough begin?"       1 month 2. SEVERITY: "How bad is the cough today?"      Occasionally  3. SPUTUM: "Describe the color of your sputum" (none, dry cough; clear, white, yellow, green)     Brown  4. HEMOPTYSIS: "Are you coughing up any blood?" If so ask: "How much?" (flecks, streaks, tablespoons, etc.)     No 5. DIFFICULTY BREATHING: "Are you having difficulty breathing?" If Yes, ask: "How bad is it?" (e.g., mild, moderate, severe)    - MILD: No SOB at rest, mild SOB with walking, speaks normally in sentences, can lie down, no retractions, pulse < 100.    - MODERATE: SOB at rest, SOB with minimal exertion and prefers to sit, cannot lie down flat, speaks in phrases, mild retractions, audible wheezing, pulse 100-120.    - SEVERE: Very SOB at rest, speaks in single words, struggling to breathe, sitting hunched forward, retractions, pulse >  120      Intermittent ear ache to 10 6. FEVER: "Do you have a fever?" If Yes, ask: "What is your temperature, how was it measured, and when did it start?"     no 7. CARDIAC HISTORY: "Do you have any history of heart disease?" (e.g., heart attack, congestive heart failure)      N/a 8. LUNG HISTORY: "Do you have any history of lung disease?"  (e.g., pulmonary embolus, asthma, emphysema)     asthma 9. PE RISK FACTORS: "Do you have a history of blood clots?" (or: recent major surgery, recent prolonged travel, bedridden)     N/a 10. OTHER SYMPTOMS: "Do you have any other symptoms?" (e.g., runny nose, wheezing, chest pain)       Post nasal drip, runny, clearing throat, wheezing using breathing, earache to right ear- no pain during call  11. PREGNANCY: "Is there any chance you are pregnant?" "When was your last menstrual period?"       na 12. TRAVEL: "Have you traveled out of the country in the last month?" (e.g., travel history, exposures)       N/a  Protocols used: Cough - Acute Non-Productive-A-AH

## 2022-06-03 DIAGNOSIS — Z419 Encounter for procedure for purposes other than remedying health state, unspecified: Secondary | ICD-10-CM | POA: Diagnosis not present

## 2022-06-14 ENCOUNTER — Other Ambulatory Visit: Payer: Self-pay | Admitting: Internal Medicine

## 2022-06-14 DIAGNOSIS — I10 Essential (primary) hypertension: Secondary | ICD-10-CM

## 2022-06-24 ENCOUNTER — Encounter: Payer: Self-pay | Admitting: Internal Medicine

## 2022-06-26 DIAGNOSIS — R32 Unspecified urinary incontinence: Secondary | ICD-10-CM | POA: Diagnosis not present

## 2022-06-28 ENCOUNTER — Telehealth: Payer: Self-pay | Admitting: Internal Medicine

## 2022-06-28 NOTE — Telephone Encounter (Signed)
Patient scheduled for 07/02/2022.

## 2022-06-28 NOTE — Telephone Encounter (Signed)
Copied from [place] [id number]. Topic: Appointment Scheduling - Scheduling Inquiry for Clinic >> Jun 27, 2022  4:40 PM Cyndi Bender wrote: Reason for CRM: Pt stated she was returning call for sooner appt. Pt requests call back

## 2022-07-02 ENCOUNTER — Ambulatory Visit: Payer: Medicaid Other | Attending: Internal Medicine | Admitting: Internal Medicine

## 2022-07-02 ENCOUNTER — Ambulatory Visit
Admission: RE | Admit: 2022-07-02 | Discharge: 2022-07-02 | Disposition: A | Discharge status: Home / Self Care | Payer: Medicaid Other | Source: Ambulatory Visit | Attending: Internal Medicine | Admitting: Internal Medicine

## 2022-07-02 ENCOUNTER — Encounter: Payer: Self-pay | Admitting: Internal Medicine

## 2022-07-02 VITALS — BP 142/82 | HR 79 | Temp 98.5°F | Ht 61.0 in | Wt 398.0 lb

## 2022-07-02 DIAGNOSIS — E6609 Other obesity due to excess calories: Secondary | ICD-10-CM

## 2022-07-02 DIAGNOSIS — I10 Essential (primary) hypertension: Secondary | ICD-10-CM

## 2022-07-02 DIAGNOSIS — Z6841 Body Mass Index (BMI) 40.0 and over, adult: Secondary | ICD-10-CM | POA: Diagnosis not present

## 2022-07-02 DIAGNOSIS — K0510 Chronic gingivitis, plaque induced: Secondary | ICD-10-CM

## 2022-07-02 DIAGNOSIS — H9203 Otalgia, bilateral: Secondary | ICD-10-CM | POA: Diagnosis not present

## 2022-07-02 DIAGNOSIS — E039 Hypothyroidism, unspecified: Secondary | ICD-10-CM

## 2022-07-02 DIAGNOSIS — K0889 Other specified disorders of teeth and supporting structures: Secondary | ICD-10-CM

## 2022-07-02 DIAGNOSIS — T162XXA Foreign body in left ear, initial encounter: Secondary | ICD-10-CM | POA: Diagnosis not present

## 2022-07-02 DIAGNOSIS — J4541 Moderate persistent asthma with (acute) exacerbation: Secondary | ICD-10-CM

## 2022-07-02 DIAGNOSIS — D509 Iron deficiency anemia, unspecified: Secondary | ICD-10-CM | POA: Diagnosis not present

## 2022-07-02 DIAGNOSIS — Z2821 Immunization not carried out because of patient refusal: Secondary | ICD-10-CM

## 2022-07-02 MED ORDER — LABETALOL HCL 300 MG PO TABS: ORAL_TABLET | ORAL | 2 refills | Status: DC

## 2022-07-02 MED ORDER — HYDROCHLOROTHIAZIDE 25 MG PO TABS: 25.0000 mg | ORAL_TABLET | Freq: Every day | ORAL | 3 refills | Status: DC

## 2022-07-02 MED ORDER — AMLODIPINE BESYLATE 10 MG PO TABS: ORAL_TABLET | ORAL | 1 refills | Status: DC

## 2022-07-02 MED ORDER — LEVOTHYROXINE SODIUM 25 MCG PO TABS: ORAL_TABLET | ORAL | 1 refills | Status: DC

## 2022-07-02 MED ORDER — BENZONATATE 200 MG PO CAPS: 200.0000 mg | ORAL_CAPSULE | Freq: Two times a day (BID) | ORAL | 0 refills | Status: DC | PRN

## 2022-07-02 MED ORDER — MOMETASONE FURO-FORMOTEROL FUM 100-5 MCG/ACT IN AERO: 2.0000 | INHALATION_SPRAY | Freq: Two times a day (BID) | RESPIRATORY_TRACT | 3 refills | Status: DC

## 2022-07-02 MED ORDER — PREDNISONE 20 MG PO TABS: 20.0000 mg | ORAL_TABLET | Freq: Every day | ORAL | 0 refills | Status: DC

## 2022-07-02 MED ORDER — AMOXICILLIN-POT CLAVULANATE 500-125 MG PO TABS: 1.0000 | ORAL_TABLET | Freq: Three times a day (TID) | ORAL | 0 refills | Status: DC

## 2022-07-02 NOTE — Progress Notes (Signed)
Patient ID: Angela [name], female    DOB: [birth year]  MRN: [id number]  CC: Cough (Lingering cough X1 week, pain in R & L ear, thick brown mucus. /Pt requesting a cord for the nebulizer. /Numbness on L side of face X3-4 days./No to flu vax.)   Subjective: Angela [name] is a 47 y.o. female who presents for UC visit Her concerns today include:  Hx of HTN, HL, morbid obesity, pre-DM, OA knees, hypothyroid, unprovoked BL PE, asthmatic bronchitis.   C/o cough productive of thick brown mucous x 3 wks Little increase SOB and wheezing; some vomiting when she has coughing spell No fever or sore throat.  Pain in both ears x 3 wks, intermittent.  Has appt with ENT 07/05/2022 Endorses increase use of Albuterol neb 3 x/day Using Coricidin and Delsym, Vicks Vapor Rub No recent Abx.   Also concern with numbness LT side of face x 2-4 days.  Started after lower jaw abscess went down - had filling fall out; had pain and swelling last wk but did not see dentitist.  Has not made appt as yet  Taking Eliquis consistently.  No bleeding  HTN:  compliant with meds and took already this a.m  Thyroid:  taking levothyroxine as prescribed.  HM:  declines flu shot.  Has not had COVID booster.    Patient Active Problem List   Diagnosis Date Noted   Vitamin D deficiency 07/09/2021   Major depressive disorder, recurrent episode, moderate ([place]) 05/16/2020   AKI (acute kidney injury) ([place]) 12/31/2019   Asthma 12/31/2019   Acquired hypothyroidism 12/31/2019   Acute pulmonary embolism ([place]) 12/30/2019   Essential hypertension 07/08/2018   Morbid obesity ([place]) 07/08/2018   Neuropathy of both feet 07/08/2018   Primary osteoarthritis of both knees 04/02/2018   Foot pain, right 04/02/2018   Chronic maxillary sinusitis 09/18/2017   HTN (hypertension), benign 12/17/2016   Pain, pelvic, female 02/05/2012   Fibroids 02/05/2012   Abnormal uterine bleeding 02/05/2012     Current Outpatient Medications on File  Prior to Visit  Medication Sig Dispense Refill   albuterol (PROVENTIL) (2.5 MG/3ML) 0.083% nebulizer solution Take 3 mLs (2.5 mg total) by nebulization every 6 (six) hours as needed for wheezing or shortness of breath. 75 mL 1   albuterol (VENTOLIN HFA) 108 (90 Base) MCG/ACT inhaler Inhale 1-2 puffs into the lungs every 6 (six) hours as needed for wheezing or shortness of breath. 8.5 g 6   diclofenac Sodium (VOLTAREN) 1 % GEL Apply 2 g topically 4 (four) times daily. 100 g 3   ELIQUIS 5 MG TABS tablet TAKE 1 TABLET(5 MG) BY MOUTH TWICE DAILY 60 tablet 3   fluticasone (FLONASE) 50 MCG/ACT nasal spray Place 1 spray into both nostrils daily. 16 g 2   loratadine (CLARITIN) 10 MG tablet TAKE 1 TABLET(10 MG) BY MOUTH DAILY 90 tablet 0   rosuvastatin (CRESTOR) 5 MG tablet TAKE 1 TABLET(5 MG) BY MOUTH DAILY** NEEDS OFFICE VISIT 90 tablet 1   solifenacin (VESICARE) 5 MG tablet TAKE 1 TABLET BY MOUTH EVERY DAY(URINE INCONTINENCE) 90 tablet 0   triamcinolone cream (KENALOG) 0.1 % Apply 1 application. topically 2 (two) times daily. 30 g 0   Vitamin D, Ergocalciferol, (DRISDOL) 1.25 MG (50000 UNIT) CAPS capsule Take 1 capsule (50,000 Units total) by mouth once a week. 12 capsule 1   No current facility-administered medications on file prior to visit.    Allergies  Allergen Reactions   Doxycycline Nausea And Vomiting  Flagyl [Metronidazole Hcl] Hives and Nausea And Vomiting    Social History   Socioeconomic History   Marital status: Single    Spouse name: Not on file   Number of children: Not on file   Years of education: Not on file   Highest education level: Not on file  Occupational History   Occupation: Licensed Cosmetologist  Tobacco Use   Smoking status: Never   Smokeless tobacco: Never  Vaping Use   Vaping Use: Never used  Substance and Sexual Activity   Alcohol use: No   Drug use: No   Sexual activity: Yes    Birth control/protection: Condom, Surgical  Other Topics Concern   Not  on file  Social History Narrative   Not on file   Social Determinants of Health   Financial Resource Strain: Not on file  Food Insecurity: Not on file  Transportation Needs: Not on file  Physical Activity: Not on file  Stress: Not on file  Social Connections: Not on file  Intimate Partner Violence: Not on file    Family History  Problem Relation Age of Onset   Hyperlipidemia Mother    Hypertension Mother    Thyroid disease Mother    Obesity Mother    Clotting disorder Mother    Cancer Son    Hypertension Other    Diabetes Other    Other Neg Hx     Past Surgical History:  Procedure Laterality Date   stye removal   1994   TUBAL LIGATION  1998    ROS: Review of Systems Negative except as stated above  PHYSICAL EXAM: BP (!) 142/82 (BP Location: Left Arm, Patient Position: Sitting, Cuff Size: Normal)   Pulse 79   Temp 98.5 F (36.9 C) (Oral)   Ht '5\' 1"'$  (1.549 m)   Wt (!) 398 lb (180.5 kg)   SpO2 92%   BMI 75.20 kg/m   Wt Readings from Last 3 Encounters:  07/02/22 (!) 398 lb (180.5 kg)  04/16/22 (!) 390 lb (176.9 kg)  02/11/22 (!) 389 lb (176.4 kg)    Physical Exam  General appearance - alert, well appearing, morbidly obese middle-age African-American female and in no distress Mental status - normal mood, behavior, speech, dress, motor activity, and thought processes Ears -right ear canal and tympanic membrane within normal limits.  Patient appears to have what looks like a small dead insect in the left ear canal close to the opening. Neck - supple, no significant adenopathy Mouth: She has mild swelling of the jawline more so on the right side.  She has significant plaque buildup on her teeth and gingivitis of the gum. Chest -breath sounds difficult to hear due to body habitus.  No wheezing appreciated. Heart - normal rate, regular rhythm, normal S1, S2, no murmurs, rubs, clicks or gallops Extremities -patient has chronic nonerythematous swelling of the  legs      Latest Ref Rng & Units 04/17/2022    1:08 AM 07/09/2021    4:25 PM 03/05/2021    4:15 PM  CMP  Glucose 70 - 99 mg/dL 114   117   BUN 6 - 20 mg/dL 16   14   Creatinine 0.44 - 1.00 mg/dL 1.22   1.08   Sodium 135 - 145 mmol/L 139   137   Potassium 3.5 - 5.1 mmol/L 4.1   4.3   Chloride 98 - 111 mmol/L 104   100   CO2 22 - 32 mmol/L 27   23  Calcium 8.9 - 10.3 mg/dL 9.5   9.7   Total Protein 6.0 - 8.5 g/dL  7.5    Total Bilirubin 0.0 - 1.2 mg/dL  <0.2    Alkaline Phos 44 - 121 IU/L  92    AST 0 - 40 IU/L  8    ALT 0 - 32 IU/L  8     Lipid Panel     Component Value Date/Time   CHOL 175 07/09/2021 1625   TRIG 104 07/09/2021 1625   HDL 41 07/09/2021 1625   CHOLHDL 4.3 07/09/2021 1625   LDLCALC 115 (H) 07/09/2021 1625    CBC    Component Value Date/Time   WBC 8.3 04/17/2022 0108   RBC 4.83 04/17/2022 0108   HGB 11.1 (L) 04/17/2022 0108   HGB 11.8 07/09/2021 1625   HCT 38.6 04/17/2022 0108   HCT 38.1 07/09/2021 1625   PLT 416 (H) 04/17/2022 0108   PLT 409 07/09/2021 1625   MCV 79.9 (L) 04/17/2022 0108   MCV 78 (L) 07/09/2021 1625   MCH 23.0 (L) 04/17/2022 0108   MCHC 28.8 (L) 04/17/2022 0108   RDW 15.3 04/17/2022 0108   RDW 14.1 07/09/2021 1625   LYMPHSABS 1.4 04/17/2022 0108   LYMPHSABS 1.7 02/03/2020 1536   MONOABS 0.6 04/17/2022 0108   EOSABS 0.2 04/17/2022 0108   EOSABS 0.3 02/03/2020 1536   BASOSABS 0.0 04/17/2022 0108   BASOSABS 0.0 02/03/2020 1536    ASSESSMENT AND PLAN: 1. Moderate persistent asthmatic bronchitis with acute exacerbation Will get her on some antibiotics, prednisone and Tessalon Perles. She will go to radiology department today for x-ray. Continue Dulera inhaler and albuterol nebulizer treatment as needed - benzonatate (TESSALON) 200 MG capsule; Take 1 capsule (200 mg total) by mouth 2 (two) times daily as needed for cough.  Dispense: 20 capsule; Refill: 0 - amoxicillin-clavulanate (AUGMENTIN) 500-125 MG tablet; Take 1 tablet by  mouth 3 (three) times daily.  Dispense: 14 tablet; Refill: 0 - DG Chest 2 View; Future - mometasone-formoterol (DULERA) 100-5 MCG/ACT AERO; Inhale 2 puffs into the lungs 2 (two) times daily.  Dispense: 13 g; Refill: 3 - CBC With Diff/Platelet - predniSONE (DELTASONE) 20 MG tablet; Take 1 tablet (20 mg total) by mouth daily with breakfast.  Dispense: 6 tablet; Refill: 0  2. Essential hypertension Close to goal.  Continue labetalol 300 mg twice a day, Norvasc 10 mg daily and hydrochlorothiazide 25 mg daily. - amLODipine (NORVASC) 10 MG tablet; TAKE 1 TABLET(10 MG) BY MOUTH DAILY  Dispense: 90 tablet; Refill: 1 - labetalol (NORMODYNE) 300 MG tablet; TAKE 1 TABLET(300 MG) BY MOUTH TWICE DAILY  Dispense: 180 tablet; Refill: 2  3. Hypothyroidism, unspecified type - levothyroxine (SYNTHROID) 25 MCG tablet; TAKE 1/2 TABLET(12.5 MCG) BY MOUTH DAILY AT 6 AM  Dispense: 45 tablet; Refill: 1  4. Ear pain, bilateral No infectious changes seen in the right ear canal or tympanic membrane.  5. Foreign body of left ear, initial encounter Patient has what looks like a small dead insect in the canal obscuring view of the eardrum.  I tried to remove this with an earwax remover but patient did not tolerate.  She will keep appointment with ENT later this week  6. Pain, dental Strongly advised that she calls and schedule appointment with a dentist as soon as possible - amoxicillin-clavulanate (AUGMENTIN) 500-125 MG tablet; Take 1 tablet by mouth 3 (three) times daily.  Dispense: 14 tablet; Refill: 0  7. Gingivitis See #6 above  8.  Influenza vaccination declined   Addendum 07/03/2022: Patient with mild stable microcytic anemia.  We will add iron studies. Patient was given the opportunity to ask questions.  Patient verbalized understanding of the plan and was able to repeat key elements of the plan.   This documentation was completed using Radio producer.  Any transcriptional errors are  unintentional.  Orders Placed This Encounter  Procedures   DG Chest 2 View   CBC With Diff/Platelet     Requested Prescriptions   Signed Prescriptions Disp Refills   benzonatate (TESSALON) 200 MG capsule 20 capsule 0    Sig: Take 1 capsule (200 mg total) by mouth 2 (two) times daily as needed for cough.   amLODipine (NORVASC) 10 MG tablet 90 tablet 1    Sig: TAKE 1 TABLET(10 MG) BY MOUTH DAILY   hydrochlorothiazide (HYDRODIURIL) 25 MG tablet 90 tablet 3    Sig: Take 1 tablet (25 mg total) by mouth daily.   labetalol (NORMODYNE) 300 MG tablet 180 tablet 2    Sig: TAKE 1 TABLET(300 MG) BY MOUTH TWICE DAILY   levothyroxine (SYNTHROID) 25 MCG tablet 45 tablet 1    Sig: TAKE 1/2 TABLET(12.5 MCG) BY MOUTH DAILY AT 6 AM   amoxicillin-clavulanate (AUGMENTIN) 500-125 MG tablet 14 tablet 0    Sig: Take 1 tablet by mouth 3 (three) times daily.   mometasone-formoterol (DULERA) 100-5 MCG/ACT AERO 13 g 3    Sig: Inhale 2 puffs into the lungs 2 (two) times daily.   predniSONE (DELTASONE) 20 MG tablet 6 tablet 0    Sig: Take 1 tablet (20 mg total) by mouth daily with breakfast.    No follow-ups on file.  Karle Plumber, MD, FACP

## 2022-07-03 LAB — CBC WITH DIFF/PLATELET
Basophils Absolute: 0 10*3/uL (ref 0.0–0.2)
Basos: 0 %
EOS (ABSOLUTE): 0.3 10*3/uL (ref 0.0–0.4)
Eos: 3 %
Hematocrit: 35.6 % (ref 34.0–46.6)
Hemoglobin: 11 g/dL — ABNORMAL LOW (ref 11.1–15.9)
Immature Grans (Abs): 0 10*3/uL (ref 0.0–0.1)
Immature Granulocytes: 1 %
Lymphocytes Absolute: 2 10*3/uL (ref 0.7–3.1)
Lymphs: 22 %
MCH: 23.3 pg — ABNORMAL LOW (ref 26.6–33.0)
MCHC: 30.9 g/dL — ABNORMAL LOW (ref 31.5–35.7)
MCV: 75 fL — ABNORMAL LOW (ref 79–97)
Monocytes Absolute: 0.7 10*3/uL (ref 0.1–0.9)
Monocytes: 8 %
Neutrophils Absolute: 5.8 10*3/uL (ref 1.4–7.0)
Neutrophils: 66 %
Platelets: 411 10*3/uL (ref 150–450)
RBC: 4.73 x10E6/uL (ref 3.77–5.28)
RDW: 14.6 % (ref 11.7–15.4)
WBC: 8.8 10*3/uL (ref 3.4–10.8)

## 2022-07-03 NOTE — Addendum Note (Signed)
Addended by: Karle Plumber B on: 07/03/2022 08:49 AM   Modules accepted: Orders

## 2022-07-04 DIAGNOSIS — Z419 Encounter for procedure for purposes other than remedying health state, unspecified: Secondary | ICD-10-CM | POA: Diagnosis not present

## 2022-07-05 DIAGNOSIS — T162XXA Foreign body in left ear, initial encounter: Secondary | ICD-10-CM | POA: Diagnosis not present

## 2022-07-05 DIAGNOSIS — W44F4XA Insect entering into or through a natural orifice, initial encounter: Secondary | ICD-10-CM | POA: Diagnosis not present

## 2022-07-05 DIAGNOSIS — H938X1 Other specified disorders of right ear: Secondary | ICD-10-CM | POA: Diagnosis not present

## 2022-07-05 DIAGNOSIS — H9202 Otalgia, left ear: Secondary | ICD-10-CM | POA: Diagnosis not present

## 2022-07-18 ENCOUNTER — Ambulatory Visit: Payer: Medicaid Other | Admitting: Internal Medicine

## 2022-07-20 DIAGNOSIS — R32 Unspecified urinary incontinence: Secondary | ICD-10-CM | POA: Diagnosis not present

## 2022-08-01 ENCOUNTER — Other Ambulatory Visit: Payer: Self-pay | Admitting: Internal Medicine

## 2022-08-01 DIAGNOSIS — J40 Bronchitis, not specified as acute or chronic: Secondary | ICD-10-CM

## 2022-08-02 DIAGNOSIS — Z419 Encounter for procedure for purposes other than remedying health state, unspecified: Secondary | ICD-10-CM | POA: Diagnosis not present

## 2022-08-07 NOTE — Progress Notes (Signed)
Open in error

## 2022-08-09 DIAGNOSIS — R32 Unspecified urinary incontinence: Secondary | ICD-10-CM | POA: Diagnosis not present

## 2022-09-02 DIAGNOSIS — Z419 Encounter for procedure for purposes other than remedying health state, unspecified: Secondary | ICD-10-CM | POA: Diagnosis not present

## 2022-09-02 DIAGNOSIS — R32 Unspecified urinary incontinence: Secondary | ICD-10-CM | POA: Diagnosis not present

## 2022-09-09 ENCOUNTER — Other Ambulatory Visit: Payer: Self-pay | Admitting: Internal Medicine

## 2022-09-09 DIAGNOSIS — E782 Mixed hyperlipidemia: Secondary | ICD-10-CM

## 2022-09-10 NOTE — Telephone Encounter (Signed)
Requested medication (s) are due for refill today: yes  Requested medication (s) are on the active medication list: yes  Last refill:  03/28/22 #90 1 refills  Future visit scheduled: yes in 1 month  Notes to clinic:  protocol failed last labs 07/09/21. Do you want to refill Rx?     Requested Prescriptions  Pending Prescriptions Disp Refills   rosuvastatin (CRESTOR) 5 MG tablet [Pharmacy Med Name: ROSUVASTATIN 5MG  TABLETS] 90 tablet 1    Sig: TAKE 1 TABLET(5 MG) BY MOUTH DAILY     Cardiovascular:  Antilipid - Statins 2 Failed - 09/09/2022  6:28 AM      Failed - Cr in normal range and within 360 days    Creatinine  Date Value Ref Range Status  12/30/2017 359.6 (H) 20.0 - 300.0 mg/dL Final   Creat  Date Value Ref Range Status  08/06/2016 1.12 (H) 0.50 - 1.10 mg/dL Final   Creatinine, Ser  Date Value Ref Range Status  04/17/2022 1.22 (H) 0.44 - 1.00 mg/dL Final         Failed - Lipid Panel in normal range within the last 12 months    Cholesterol, Total  Date Value Ref Range Status  07/09/2021 175 100 - 199 mg/dL Final   LDL Chol Calc (NIH)  Date Value Ref Range Status  07/09/2021 115 (H) 0 - 99 mg/dL Final   HDL  Date Value Ref Range Status  07/09/2021 41 >39 mg/dL Final   Triglycerides  Date Value Ref Range Status  07/09/2021 104 0 - 149 mg/dL Final         Passed - Patient is not pregnant      Passed - Valid encounter within last 12 months    Recent Outpatient Visits           2 months ago Moderate persistent asthmatic bronchitis with acute exacerbation   [place] [facility] Marcine Matar, MD   5 months ago Bronchitis with asthma, acute   [facility] Marcine Matar, MD   6 months ago Urinary incontinence, mixed   [facility] Marcine Matar, MD   10 months ago Loud snoring   [place] [facility] Marcine Matar, MD   10  months ago Primary hypertension   [facility], Shea Stakes, NP       Future Appointments             In 1 month Laural Benes, Binnie Rail, MD [facility]

## 2022-09-19 ENCOUNTER — Other Ambulatory Visit: Payer: Self-pay | Admitting: Internal Medicine

## 2022-09-19 DIAGNOSIS — N3946 Mixed incontinence: Secondary | ICD-10-CM

## 2022-10-02 DIAGNOSIS — Z419 Encounter for procedure for purposes other than remedying health state, unspecified: Secondary | ICD-10-CM | POA: Diagnosis not present

## 2022-10-09 DIAGNOSIS — R32 Unspecified urinary incontinence: Secondary | ICD-10-CM | POA: Diagnosis not present

## 2022-10-24 ENCOUNTER — Other Ambulatory Visit: Payer: Self-pay

## 2022-10-24 ENCOUNTER — Other Ambulatory Visit: Payer: Self-pay | Admitting: Internal Medicine

## 2022-10-24 DIAGNOSIS — J4541 Moderate persistent asthma with (acute) exacerbation: Secondary | ICD-10-CM

## 2022-10-24 DIAGNOSIS — N3946 Mixed incontinence: Secondary | ICD-10-CM

## 2022-10-24 NOTE — Progress Notes (Signed)
   Angela [name] 23-Apr-1976 [id number]  Patient outreached by Sharion Dove , PharmD Candidate on 10/24/2022.  Blood Pressure Readings: Last documented ambulatory systolic blood pressure: 142 Last documented ambulatory diastolic blood pressure: 82 Does the patient have a validated home blood pressure machine?: Yes, however patient has been unable to locate the BP machine at home and has stated they have not been able to take readings in months.   Medication review was performed. Is the patient taking their medications as prescribed?: Yes Patient sometimes forgets to take their synthroid due to them needing to take it early in the morning.   The following barriers to adherence were noted: Does the patient have cost concerns?: No Does the patient have transportation concerns?: No (gets them delivered, no one to pick them up if they cannot obtain) Does the patient need assistance obtaining refills?: Yes Does the patient occassionally forget to take some of their prescribed medications?: Yes (sometimes forgets to take medications in the early morning (synthroid)) Does the patient feel like one/some of their medications make them feel poorly?: No Does the patient have questions or concerns about their medications?: No Does the patient have a follow up scheduled with their primary care provider/cardiologist?: Yes  The patient has been adherent to their medications but is having issues with getting their medications delivered occasionally. Does not have someone to pick them up if the delivery does not go through. They get their medications delivered through [facility] services. Patient states they are unable to find their BP machine and has not checked their BP in months. Instructed patient to try and find machine or obtain a new one so they can check their BP regularly. Instructed patient to take their BP medications prior to visit. Does not have any concerns in terms of side effects or cost with  their medications.    Interventions: Interventions Completed: Medications were reviewed  The patient has follow up scheduled:  PCP: Marcine Matar, MD   Sharion Dove, Student-PharmD

## 2022-10-25 ENCOUNTER — Ambulatory Visit: Payer: Medicaid Other | Attending: Internal Medicine | Admitting: Internal Medicine

## 2022-10-25 ENCOUNTER — Encounter: Payer: Self-pay | Admitting: Internal Medicine

## 2022-10-25 VITALS — BP 124/76 | HR 68 | Temp 98.4°F | Ht 61.0 in | Wt 391.0 lb

## 2022-10-25 DIAGNOSIS — R7303 Prediabetes: Secondary | ICD-10-CM | POA: Diagnosis not present

## 2022-10-25 DIAGNOSIS — I10 Essential (primary) hypertension: Secondary | ICD-10-CM

## 2022-10-25 DIAGNOSIS — E039 Hypothyroidism, unspecified: Secondary | ICD-10-CM

## 2022-10-25 DIAGNOSIS — Z1211 Encounter for screening for malignant neoplasm of colon: Secondary | ICD-10-CM | POA: Diagnosis not present

## 2022-10-25 DIAGNOSIS — D509 Iron deficiency anemia, unspecified: Secondary | ICD-10-CM | POA: Diagnosis not present

## 2022-10-25 DIAGNOSIS — L853 Xerosis cutis: Secondary | ICD-10-CM | POA: Diagnosis not present

## 2022-10-25 DIAGNOSIS — J40 Bronchitis, not specified as acute or chronic: Secondary | ICD-10-CM

## 2022-10-25 MED ORDER — AMLODIPINE BESYLATE 10 MG PO TABS: ORAL_TABLET | ORAL | 1 refills | Status: DC

## 2022-10-25 MED ORDER — BENZONATATE 100 MG PO CAPS: 100.0000 mg | ORAL_CAPSULE | Freq: Two times a day (BID) | ORAL | 0 refills | Status: DC | PRN

## 2022-10-25 MED ORDER — LEVOTHYROXINE SODIUM 25 MCG PO TABS: ORAL_TABLET | ORAL | 1 refills | Status: DC

## 2022-10-25 MED ORDER — TRIAMCINOLONE ACETONIDE 0.1 % EX CREA: 1.0000 | TOPICAL_CREAM | Freq: Two times a day (BID) | CUTANEOUS | 0 refills | Status: DC

## 2022-10-25 MED ORDER — PREDNISONE 20 MG PO TABS: 20.0000 mg | ORAL_TABLET | Freq: Every day | ORAL | 0 refills | Status: DC

## 2022-10-25 MED ORDER — WEGOVY 0.25 MG/0.5ML ~~LOC~~ SOAJ: 0.2500 mg | SUBCUTANEOUS | 1 refills | Status: DC

## 2022-10-25 MED ORDER — AZITHROMYCIN 250 MG PO TABS: ORAL_TABLET | ORAL | 0 refills | Status: DC

## 2022-10-25 NOTE — Progress Notes (Signed)
Patient ID: Angela [name], female    DOB: 23-Mar-1976  MRN: [id number]  CC: Hypertension (Htn f/u. Med refills. /Pt unsure if she still needs to take Vit D)   Subjective: Angela [name] is a 47 y.o. female who presents for chronic ds management Her concerns today include:  Hx of HTN, HL, morbid obesity, pre-DM, OA knees, hypothyroid, unprovoked BL PE, asthmatic bronchitis.   HTN: Patient is on labetalol 300 mg twice a day, Norvasc 10 mg daily, HCTZ 25 mg daily.  Reports compliance with medications.  She tries to limit salt in the foods.  Thyroid: She is on levothyroxine 25 mcg half tablet daily.  Misses dose about 2 days a wk  Hx of PE:  no bruising bleeding on Eliquis.  Has had cold x 6 days Cough productive of thick sputum blood ting.Little increase SOB.  No sore throat, fever, no sick contacts Little pain RT chin x 1 mth.  Intermittent.    Anemia: Hemoglobin has been fairly stable between 11-11 0.8/35.6-38.6 stable Still gets menses, light lasting 3-5 days  Obesity/PreDM:  down 7 lbs since I last saw her in January.  BMI is in the 70s.  Requests refill on triamcinolone cream.  HM: Due for Pap and colon cancer screening. Patient Active Problem List   Diagnosis Date Noted   Vitamin D deficiency 07/09/2021   Major depressive disorder, recurrent episode, moderate (HCC) 05/16/2020   AKI (acute kidney injury) (HCC) 12/31/2019   Asthma 12/31/2019   Acquired hypothyroidism 12/31/2019   Acute pulmonary embolism (HCC) 12/30/2019   Essential hypertension 07/08/2018   Morbid obesity (HCC) 07/08/2018   Neuropathy of both feet 07/08/2018   Primary osteoarthritis of both knees 04/02/2018   Foot pain, right 04/02/2018   Chronic maxillary sinusitis 09/18/2017   HTN (hypertension), benign 12/17/2016   Pain, pelvic, female 02/05/2012   Fibroids 02/05/2012   Abnormal uterine bleeding 02/05/2012     Current Outpatient Medications on File Prior to Visit  Medication Sig Dispense  Refill   albuterol (PROVENTIL) (2.5 MG/3ML) 0.083% nebulizer solution Take 3 mLs (2.5 mg total) by nebulization every 6 (six) hours as needed for wheezing or shortness of breath. 75 mL 1   albuterol (VENTOLIN HFA) 108 (90 Base) MCG/ACT inhaler INHALE 1 TO 2 PUFFS INTO THE LUNGS EVERY 6 HOURS AS NEEDED FOR WHEEZING OR SHORTNESS OF BREATH 18 g 2   amLODipine (NORVASC) 10 MG tablet TAKE 1 TABLET(10 MG) BY MOUTH DAILY 90 tablet 1   diclofenac Sodium (VOLTAREN) 1 % GEL Apply 2 g topically 4 (four) times daily. 100 g 3   ELIQUIS 5 MG TABS tablet TAKE 1 TABLET(5 MG) BY MOUTH TWICE DAILY 60 tablet 3   fluticasone (FLONASE) 50 MCG/ACT nasal spray Place 1 spray into both nostrils daily. 16 g 2   hydrochlorothiazide (HYDRODIURIL) 25 MG tablet Take 1 tablet (25 mg total) by mouth daily. 90 tablet 3   labetalol (NORMODYNE) 300 MG tablet TAKE 1 TABLET(300 MG) BY MOUTH TWICE DAILY 180 tablet 2   levothyroxine (SYNTHROID) 25 MCG tablet TAKE 1/2 TABLET(12.5 MCG) BY MOUTH DAILY AT 6 AM 45 tablet 1   loratadine (CLARITIN) 10 MG tablet TAKE 1 TABLET(10 MG) BY MOUTH DAILY 90 tablet 0   mometasone-formoterol (DULERA) 100-5 MCG/ACT AERO Inhale 2 puffs into the lungs 2 (two) times daily. 13 g 3   rosuvastatin (CRESTOR) 5 MG tablet TAKE 1 TABLET(5 MG) BY MOUTH DAILY 90 tablet 0   solifenacin (VESICARE) 5 MG tablet  TAKE 1 TABLET BY MOUTH EVERY DAY(URINE INCONTINENCE) 30 tablet 0   triamcinolone cream (KENALOG) 0.1 % Apply 1 application. topically 2 (two) times daily. 30 g 0   Vitamin D, Ergocalciferol, (DRISDOL) 1.25 MG (50000 UNIT) CAPS capsule Take 1 capsule (50,000 Units total) by mouth once a week. 12 capsule 1   predniSONE (DELTASONE) 20 MG tablet Take 1 tablet (20 mg total) by mouth daily with breakfast. (Patient not taking: Reported on 10/24/2022) 6 tablet 0   No current facility-administered medications on file prior to visit.    Allergies  Allergen Reactions   Doxycycline Nausea And Vomiting   Flagyl  [Metronidazole Hcl] Hives and Nausea And Vomiting    Social History   Socioeconomic History   Marital status: Single    Spouse name: Not on file   Number of children: Not on file   Years of education: Not on file   Highest education level: Not on file  Occupational History   Occupation: Licensed Cosmetologist  Tobacco Use   Smoking status: Never   Smokeless tobacco: Never  Vaping Use   Vaping Use: Never used  Substance and Sexual Activity   Alcohol use: No   Drug use: No   Sexual activity: Yes    Birth control/protection: Condom, Surgical  Other Topics Concern   Not on file  Social History Narrative   Not on file   Social Determinants of Health   Financial Resource Strain: Not on file  Food Insecurity: Not on file  Transportation Needs: Not on file  Physical Activity: Not on file  Stress: Not on file  Social Connections: Not on file  Intimate Partner Violence: Not on file    Family History  Problem Relation Age of Onset   Hyperlipidemia Mother    Hypertension Mother    Thyroid disease Mother    Obesity Mother    Clotting disorder Mother    Cancer Son    Hypertension Other    Diabetes Other    Other Neg Hx     Past Surgical History:  Procedure Laterality Date   stye removal   1994   TUBAL LIGATION  1998    ROS: Review of Systems Negative except as stated above  PHYSICAL EXAM: BP 124/76 (BP Location: Left Arm, Patient Position: Sitting, Cuff Size: Large)   Pulse 68   Temp 98.4 F (36.9 C) (Oral)   Ht 5\' 1"  (1.549 m)   Wt (!) 391 lb (177.4 kg)   SpO2 96%   BMI 73.88 kg/m   Wt Readings from Last 3 Encounters:  10/25/22 (!) 391 lb (177.4 kg)  07/02/22 (!) 398 lb (180.5 kg)  04/16/22 (!) 390 lb (176.9 kg)    Physical Exam  General appearance - alert, well appearing, morbidly obese middle-age African-American female and in no distress Mental status - normal mood, behavior, speech, dress, motor activity, and thought processes Mouth -oral  mucosa is moist.  Able to see throat on the left side but not clearly on the right side due to patient gagging.  Did not appreciate any erythema. Neck - supple, no significant adenopathy Chest -breath sounds decreased bilaterally.  No wheezes or crackles heard Heart - normal rate, regular rhythm, normal S1, S2, no murmurs, rubs, clicks or gallops      Latest Ref Rng & Units 04/17/2022    1:08 AM 07/09/2021    4:25 PM 03/05/2021    4:15 PM  CMP  Glucose 70 - 99 mg/dL 161   096  BUN 6 - 20 mg/dL 16   14   Creatinine 1.61 - 1.00 mg/dL 0.96   0.45   Sodium 409 - 145 mmol/L 139   137   Potassium 3.5 - 5.1 mmol/L 4.1   4.3   Chloride 98 - 111 mmol/L 104   100   CO2 22 - 32 mmol/L 27   23   Calcium 8.9 - 10.3 mg/dL 9.5   9.7   Total Protein 6.0 - 8.5 g/dL  7.5    Total Bilirubin 0.0 - 1.2 mg/dL  <8.1    Alkaline Phos 44 - 121 IU/L  92    AST 0 - 40 IU/L  8    ALT 0 - 32 IU/L  8     Lipid Panel     Component Value Date/Time   CHOL 175 07/09/2021 1625   TRIG 104 07/09/2021 1625   HDL 41 07/09/2021 1625   CHOLHDL 4.3 07/09/2021 1625   LDLCALC 115 (H) 07/09/2021 1625    CBC    Component Value Date/Time   WBC 8.8 07/02/2022 1123   WBC 8.3 04/17/2022 0108   RBC 4.73 07/02/2022 1123   RBC 4.83 04/17/2022 0108   HGB 11.0 (L) 07/02/2022 1123   HCT 35.6 07/02/2022 1123   PLT 411 07/02/2022 1123   MCV 75 (L) 07/02/2022 1123   MCH 23.3 (L) 07/02/2022 1123   MCH 23.0 (L) 04/17/2022 0108   MCHC 30.9 (L) 07/02/2022 1123   MCHC 28.8 (L) 04/17/2022 0108   RDW 14.6 07/02/2022 1123   LYMPHSABS 2.0 07/02/2022 1123   MONOABS 0.6 04/17/2022 0108   EOSABS 0.3 07/02/2022 1123   BASOSABS 0.0 07/02/2022 1123    ASSESSMENT AND PLAN: 1. Essential hypertension At goal.  Continue labetalol 300 mg twice a day, Norvasc 10 mg daily and HCTZ 25 mg daily. - amLODipine (NORVASC) 10 MG tablet; TAKE 1 TABLET(10 MG) BY MOUTH DAILY  Dispense: 90 tablet; Refill: 1 - Comprehensive metabolic panel  2.  Hypothyroidism, unspecified type Encourage compliance with medication.  Check TSH today. - levothyroxine (SYNTHROID) 25 MCG tablet; TAKE 1/2 TABLET(12.5 MCG) BY MOUTH DAILY AT 6 AM  Dispense: 45 tablet; Refill: 1 - TSH  3. Microcytic anemia - CBC - Iron, TIBC and Ferritin Panel  4. Morbid obesity (HCC) Discussed trying her with Reginal Lutes provided that it is covered by her insurance.  I went over with her how the medication works and possible side effects.  Discussed that the medication can cause nausea especially the first several weeks after starting it.  Discussed that it can cause some vomiting, pancreatitis, significant diarrhea, constipation, bowel blockage.  Advised to stop the medication if she develops any vomiting, pain in the abdomen or significant diarrhea.  She expressed understanding and is willing to try the medication once weekly injection starting at 0.25 mg.  Prescription sent to her pharmacy.  Advised that if it is approved, when she goes to pick it up from the pharmacy, she should have the pharmacist show her how to administer the injection.  Advised that she lets me know via MyChart whether she is approved.  If she is, I would like to see her back in several weeks to see how she is doing on the medication and for possible dose adjustment upward. - Semaglutide-Weight Management (WEGOVY) 0.25 MG/0.5ML SOAJ; Inject 0.25 mg into the skin once a week.  Dispense: 2 mL; Refill: 1 - Lipid panel - Hemoglobin A1c  5. Prediabetes See #4 above. -  Hemoglobin A1c  6. Bronchitis - azithromycin (ZITHROMAX Z-PAK) 250 MG tablet; 2 tabs PO x 1 then 1 tab PO daily  Dispense: 6 each; Refill: 0 - benzonatate (TESSALON) 100 MG capsule; Take 1 capsule (100 mg total) by mouth 2 (two) times daily as needed for cough.  Dispense: 20 capsule; Refill: 0 - predniSONE (DELTASONE) 20 MG tablet; Take 1 tablet (20 mg total) by mouth daily with breakfast.  Dispense: 4 tablet; Refill: 0  7. Dry skin -  triamcinolone cream (KENALOG) 0.1 %; Apply 1 Application topically 2 (two) times daily.  Dispense: 30 g; Refill: 0  8. Screening for colon cancer Patient prefers to do Cologuard test. - Cologuard     Patient was given the opportunity to ask questions.  Patient verbalized understanding of the plan and was able to repeat key elements of the plan.   This documentation was completed using Paediatric nurse.  Any transcriptional errors are unintentional.  No orders of the defined types were placed in this encounter.    Requested Prescriptions   Pending Prescriptions Disp Refills   triamcinolone cream (KENALOG) 0.1 % 30 g 0    Sig: Apply 1 Application topically 2 (two) times daily.   levothyroxine (SYNTHROID) 25 MCG tablet 45 tablet 1    Sig: TAKE 1/2 TABLET(12.5 MCG) BY MOUTH DAILY AT 6 AM   amLODipine (NORVASC) 10 MG tablet 90 tablet 1    Sig: TAKE 1 TABLET(10 MG) BY MOUTH DAILY    No follow-ups on file.  Jonah Blue, MD, FACP

## 2022-10-25 NOTE — Patient Instructions (Signed)
I have sent prescription to your pharmacy for Zithromax antibiotic and 4 days of prednisone.  I have sent a prescription to your pharmacy for Wegovy/Ozempic.  We will need to get prior approval on this medication.  Semaglutide/Wegovy  Injection (Weight Management) What is this medication? SEMAGLUTIDE (SEM a GLOO tide) promotes weight loss. It may also be used to maintain weight loss. It works by decreasing appetite. Changes to diet and exercise are often combined with this medication. This medicine may be used for other purposes; ask your health care provider or pharmacist if you have questions. COMMON BRAND NAME(S): GNFAOZ What should I tell my care team before I take this medication? They need to know if you have any of these conditions: Endocrine tumors (MEN 2) or if someone in your family had these tumors Eye disease, vision problems Gallbladder disease History of depression or mental health disease History of pancreatitis Kidney disease Stomach or intestine problems Suicidal thoughts, plans, or attempt; a previous suicide attempt by you or a family member Thyroid cancer or if someone in your family had thyroid cancer An unusual or allergic reaction to semaglutide, other medications, foods, dyes, or preservatives Pregnant or trying to get pregnant Breast-feeding How should I use this medication? This medication is injected under the skin. You will be taught how to prepare and give it. Take it as directed on the prescription label. It is given once every week (every 7 days). Keep taking it unless your care team tells you to stop. It is important that you put your used needles and pens in a special sharps container. Do not put them in a trash can. If you do not have a sharps container, call your pharmacist or care team to get one. A special MedGuide will be given to you by the pharmacist with each prescription and refill. Be sure to read this information carefully each time. This  medication comes with INSTRUCTIONS FOR USE. Ask your pharmacist for directions on how to use this medication. Read the information carefully. Talk to your pharmacist or care team if you have questions. Talk to your care team about the use of this medication in children. While it may be prescribed for children as young as 12 years for selected conditions, precautions do apply. Overdosage: If you think you have taken too much of this medicine contact a poison control center or emergency room at once. NOTE: This medicine is only for you. Do not share this medicine with others. What if I miss a dose? If you miss a dose and the next scheduled dose is more than 2 days away, take the missed dose as soon as possible. If you miss a dose and the next scheduled dose is less than 2 days away, do not take the missed dose. Take the next dose at your regular time. Do not take double or extra doses. If you miss your dose for 2 weeks or more, take the next dose at your regular time or call your care team to talk about how to restart this medication. What may interact with this medication? Insulin and other medications for diabetes This list may not describe all possible interactions. Give your health care provider a list of all the medicines, herbs, non-prescription drugs, or dietary supplements you use. Also tell them if you smoke, drink alcohol, or use illegal drugs. Some items may interact with your medicine. What should I watch for while using this medication? Visit your care team for regular checks on your progress.  It may be some time before you see the benefit from this medication. Drink plenty of fluids while taking this medication. Check with your care team if you have severe diarrhea, nausea, and vomiting, or if you sweat a lot. The loss of too much body fluid may make it dangerous for you to take this medication. This medication may affect blood sugar levels. Ask your care team if changes in diet or  medications are needed if you have diabetes. Talk to your care team if may be pregnant. Losing weight while pregnant is not advised and may cause harm to the fetus. Talk to your care team for more information. What side effects may I notice from receiving this medication? Side effects that you should report to your care team as soon as possible: Allergic reactions--skin rash, itching, hives, swelling of the face, lips, tongue, or throat Change in vision Dehydration--increased thirst, dry mouth, feeling faint or lightheaded, headache, dark yellow or brown urine Gallbladder problems--severe stomach pain, nausea, vomiting, fever Heart palpitations--rapid, pounding, or irregular heartbeat Kidney injury--decrease in the amount of urine, swelling of the ankles, hands, or feet Pancreatitis--severe stomach pain that spreads to your back or gets worse after eating or when touched, fever, nausea, vomiting Thoughts of suicide or self-harm, worsening mood, feelings of depression Thyroid cancer--new mass or lump in the neck, pain or trouble swallowing, trouble breathing, hoarseness Side effects that usually do not require medical attention (report these to your care team if they continue or are bothersome): Diarrhea Loss of appetite Nausea Upset stomach This list may not describe all possible side effects. Call your doctor for medical advice about side effects. You may report side effects to FDA at 1-800-FDA-1088. Where should I keep my medication? Keep out of the reach of children and pets. Refrigeration (preferred): Store in the refrigerator. Do not freeze. Keep this medication in the original container until you are ready to take it. Get rid of any unused medication after the expiration date. Room temperature: If needed, prior to cap removal, the pen can be stored at room temperature for up to 28 days. Protect from light. If it is stored at room temperature, get rid of any unused medication after 28 days  or after it expires, whichever is first. It is important to get rid of the medication as soon as you no longer need it or it is expired. You can do this in two ways: Take the medication to a medication take-back program. Check with your pharmacy or law enforcement to find a location. If you cannot return the medication, follow the directions in the MedGuide. NOTE: This sheet is a summary. It may not cover all possible information. If you have questions about this medicine, talk to your doctor, pharmacist, or health care provider.  2024 Elsevier/Gold Standard (2022-07-28 00:00:00)

## 2022-10-26 ENCOUNTER — Other Ambulatory Visit: Payer: Self-pay | Admitting: Internal Medicine

## 2022-10-26 LAB — IRON,TIBC AND FERRITIN PANEL
Ferritin: 19 ng/mL (ref 15–150)
Iron Saturation: 8 % — CL (ref 15–55)
Iron: 30 ug/dL (ref 27–159)
Total Iron Binding Capacity: 382 ug/dL (ref 250–450)
UIBC: 352 ug/dL (ref 131–425)

## 2022-10-26 LAB — COMPREHENSIVE METABOLIC PANEL
ALT: 7 IU/L (ref 0–32)
AST: 12 IU/L (ref 0–40)
Albumin/Globulin Ratio: 1.1 — ABNORMAL LOW (ref 1.2–2.2)
Albumin: 4.2 g/dL (ref 3.9–4.9)
Alkaline Phosphatase: 95 IU/L (ref 44–121)
BUN/Creatinine Ratio: 9 (ref 9–23)
BUN: 8 mg/dL (ref 6–24)
Bilirubin Total: 0.2 mg/dL (ref 0.0–1.2)
CO2: 23 mmol/L (ref 20–29)
Calcium: 10.4 mg/dL — ABNORMAL HIGH (ref 8.7–10.2)
Chloride: 99 mmol/L (ref 96–106)
Creatinine, Ser: 0.93 mg/dL (ref 0.57–1.00)
Globulin, Total: 3.7 g/dL (ref 1.5–4.5)
Glucose: 123 mg/dL — ABNORMAL HIGH (ref 70–99)
Potassium: 3.9 mmol/L (ref 3.5–5.2)
Sodium: 139 mmol/L (ref 134–144)
Total Protein: 7.9 g/dL (ref 6.0–8.5)
eGFR: 75 mL/min/{1.73_m2} (ref >59)

## 2022-10-26 LAB — CBC
Hematocrit: 36.4 % (ref 34.0–46.6)
Hemoglobin: 11.4 g/dL (ref 11.1–15.9)
MCH: 23.2 pg — ABNORMAL LOW (ref 26.6–33.0)
MCHC: 31.3 g/dL — ABNORMAL LOW (ref 31.5–35.7)
MCV: 74 fL — ABNORMAL LOW (ref 79–97)
Platelets: 460 10*3/uL — ABNORMAL HIGH (ref 150–450)
RBC: 4.92 x10E6/uL (ref 3.77–5.28)
RDW: 14.7 % (ref 11.7–15.4)
WBC: 9.2 10*3/uL (ref 3.4–10.8)

## 2022-10-26 LAB — LIPID PANEL
Chol/HDL Ratio: 3.8 ratio (ref 0.0–4.4)
Cholesterol, Total: 205 mg/dL — ABNORMAL HIGH (ref 100–199)
HDL: 54 mg/dL (ref >39)
LDL Chol Calc (NIH): 134 mg/dL — ABNORMAL HIGH (ref 0–99)
Triglycerides: 95 mg/dL (ref 0–149)
VLDL Cholesterol Cal: 17 mg/dL (ref 5–40)

## 2022-10-26 LAB — TSH: TSH: 8.99 u[IU]/mL — ABNORMAL HIGH (ref 0.450–4.500)

## 2022-10-26 LAB — HEMOGLOBIN A1C
Est. average glucose Bld gHb Est-mCnc: 134 mg/dL
Hgb A1c MFr Bld: 6.3 % — ABNORMAL HIGH (ref 4.8–5.6)

## 2022-10-26 MED ORDER — FERROUS SULFATE 325 (65 FE) MG PO TABS: 325.0000 mg | ORAL_TABLET | Freq: Every day | ORAL | 1 refills | Status: DC

## 2022-11-02 DIAGNOSIS — R32 Unspecified urinary incontinence: Secondary | ICD-10-CM | POA: Diagnosis not present

## 2022-11-02 DIAGNOSIS — Z419 Encounter for procedure for purposes other than remedying health state, unspecified: Secondary | ICD-10-CM | POA: Diagnosis not present

## 2022-11-08 ENCOUNTER — Other Ambulatory Visit: Payer: Self-pay | Admitting: Internal Medicine

## 2022-11-08 DIAGNOSIS — Z86711 Personal history of pulmonary embolism: Secondary | ICD-10-CM

## 2022-11-11 ENCOUNTER — Other Ambulatory Visit: Payer: Self-pay | Admitting: Internal Medicine

## 2022-11-11 ENCOUNTER — Encounter: Payer: Self-pay | Admitting: Internal Medicine

## 2022-11-11 MED ORDER — FLUCONAZOLE 150 MG PO TABS: 150.0000 mg | ORAL_TABLET | Freq: Every day | ORAL | 0 refills | Status: DC

## 2022-11-12 ENCOUNTER — Other Ambulatory Visit: Payer: Self-pay | Admitting: Internal Medicine

## 2022-11-12 DIAGNOSIS — J40 Bronchitis, not specified as acute or chronic: Secondary | ICD-10-CM

## 2022-11-14 ENCOUNTER — Encounter: Payer: Self-pay | Admitting: Internal Medicine

## 2022-12-02 DIAGNOSIS — R32 Unspecified urinary incontinence: Secondary | ICD-10-CM | POA: Diagnosis not present

## 2022-12-02 DIAGNOSIS — Z419 Encounter for procedure for purposes other than remedying health state, unspecified: Secondary | ICD-10-CM | POA: Diagnosis not present

## 2023-01-02 DIAGNOSIS — R32 Unspecified urinary incontinence: Secondary | ICD-10-CM | POA: Diagnosis not present

## 2023-01-02 DIAGNOSIS — Z419 Encounter for procedure for purposes other than remedying health state, unspecified: Secondary | ICD-10-CM | POA: Diagnosis not present

## 2023-02-02 DIAGNOSIS — Z419 Encounter for procedure for purposes other than remedying health state, unspecified: Secondary | ICD-10-CM | POA: Diagnosis not present

## 2023-02-07 ENCOUNTER — Ambulatory Visit: Payer: Medicaid Other | Admitting: Internal Medicine

## 2023-02-19 ENCOUNTER — Telehealth: Payer: Self-pay

## 2023-02-19 NOTE — Telephone Encounter (Signed)
LVM for patient to call back [phone], or to call PCP office to schedule follow up apt. AS, CMA  

## 2023-03-04 DIAGNOSIS — Z419 Encounter for procedure for purposes other than remedying health state, unspecified: Secondary | ICD-10-CM | POA: Diagnosis not present

## 2023-03-10 ENCOUNTER — Telehealth: Payer: Medicaid Other | Admitting: Emergency Medicine

## 2023-03-10 DIAGNOSIS — J329 Chronic sinusitis, unspecified: Secondary | ICD-10-CM

## 2023-03-10 MED ORDER — AMOXICILLIN-POT CLAVULANATE 875-125 MG PO TABS: 1.0000 | ORAL_TABLET | Freq: Two times a day (BID) | ORAL | 0 refills | Status: DC

## 2023-03-10 MED ORDER — BENZONATATE 100 MG PO CAPS: 100.0000 mg | ORAL_CAPSULE | Freq: Two times a day (BID) | ORAL | 0 refills | Status: DC | PRN

## 2023-03-10 NOTE — Patient Instructions (Signed)
Angela [name], thank you for joining Roxy Horseman, PA-C for today's virtual visit.  While this provider is not your primary care provider (PCP), if your PCP is located in our provider database this encounter information will be shared with them immediately following your visit.   A [facility] MyChart account gives you access to today's visit and all your visits, tests, and labs performed at [facility] " click here if you don't have a [facility] MyChart account or go to [email or web address]  Consent: (Patient) Angela [name] provided verbal consent for this virtual visit at the beginning of the encounter.  Current Medications:  Current Outpatient Medications:    amoxicillin-clavulanate (AUGMENTIN) 875-125 MG tablet, Take 1 tablet by mouth every 12 (twelve) hours., Disp: 14 tablet, Rfl: 0   benzonatate (TESSALON) 100 MG capsule, Take 1 capsule (100 mg total) by mouth 2 (two) times daily as needed for cough., Disp: 20 capsule, Rfl: 0   albuterol (PROVENTIL) (2.5 MG/3ML) 0.083% nebulizer solution, Take 3 mLs (2.5 mg total) by nebulization every 6 (six) hours as needed for wheezing or shortness of breath., Disp: 75 mL, Rfl: 1   amLODipine (NORVASC) 10 MG tablet, TAKE 1 TABLET(10 MG) BY MOUTH DAILY, Disp: 90 tablet, Rfl: 1   azithromycin (ZITHROMAX Z-PAK) 250 MG tablet, 2 tabs PO x 1 then 1 tab PO daily, Disp: 6 each, Rfl: 0   diclofenac Sodium (VOLTAREN) 1 % GEL, Apply 2 g topically 4 (four) times daily., Disp: 100 g, Rfl: 3   ELIQUIS 5 MG TABS tablet, TAKE 1 TABLET(5 MG) BY MOUTH TWICE DAILY, Disp: 60 tablet, Rfl: 3   ferrous sulfate 325 (65 FE) MG tablet, Take 1 tablet (325 mg total) by mouth daily with breakfast., Disp: 100 tablet, Rfl: 1   fluconazole (DIFLUCAN) 150 MG tablet, Take 1 tablet (150 mg total) by mouth daily., Disp: 1 tablet, Rfl: 0   fluticasone (FLONASE) 50 MCG/ACT nasal spray, Place 1 spray into both nostrils daily., Disp: 16 g, Rfl: 2    hydrochlorothiazide (HYDRODIURIL) 25 MG tablet, Take 1 tablet (25 mg total) by mouth daily., Disp: 90 tablet, Rfl: 3   labetalol (NORMODYNE) 300 MG tablet, TAKE 1 TABLET(300 MG) BY MOUTH TWICE DAILY, Disp: 180 tablet, Rfl: 2   levothyroxine (SYNTHROID) 25 MCG tablet, TAKE 1/2 TABLET(12.5 MCG) BY MOUTH DAILY AT 6 AM, Disp: 45 tablet, Rfl: 1   loratadine (CLARITIN) 10 MG tablet, TAKE 1 TABLET(10 MG) BY MOUTH DAILY, Disp: 90 tablet, Rfl: 0   mometasone-formoterol (DULERA) 100-5 MCG/ACT AERO, Inhale 2 puffs into the lungs 2 (two) times daily., Disp: 13 g, Rfl: 3   predniSONE (DELTASONE) 20 MG tablet, Take 1 tablet (20 mg total) by mouth daily with breakfast., Disp: 4 tablet, Rfl: 0   rosuvastatin (CRESTOR) 5 MG tablet, TAKE 1 TABLET(5 MG) BY MOUTH DAILY, Disp: 90 tablet, Rfl: 0   Semaglutide-Weight Management (WEGOVY) 0.25 MG/0.5ML SOAJ, Inject 0.25 mg into the skin once a week., Disp: 2 mL, Rfl: 1   solifenacin (VESICARE) 5 MG tablet, TAKE 1 TABLET BY MOUTH EVERY DAY(URINE INCONTINENCE), Disp: 30 tablet, Rfl: 0   triamcinolone cream (KENALOG) 0.1 %, Apply 1 Application topically 2 (two) times daily., Disp: 30 g, Rfl: 0   VENTOLIN HFA 108 (90 Base) MCG/ACT inhaler, INHALE 1 TO 2 PUFFS INTO THE LUNGS EVERY 6 HOURS AS NEEDED FOR WHEEZING OR SHORTNESS OF BREATH, Disp: 18 g, Rfl: 2   Vitamin D, Ergocalciferol, (DRISDOL) 1.25 MG (50000 UNIT) CAPS capsule, Take  1 capsule (50,000 Units total) by mouth once a week., Disp: 12 capsule, Rfl: 1   Medications ordered in this encounter:  Meds ordered this encounter  Medications   amoxicillin-clavulanate (AUGMENTIN) 875-125 MG tablet    Sig: Take 1 tablet by mouth every 12 (twelve) hours.    Dispense:  14 tablet    Refill:  0    Order Specific Question:   Supervising Provider    Answer:   Merrilee Jansky [[id number]]   benzonatate (TESSALON) 100 MG capsule    Sig: Take 1 capsule (100 mg total) by mouth 2 (two) times daily as needed for cough.    Dispense:  20  capsule    Refill:  0    Order Specific Question:   Supervising Provider    Answer:   Merrilee Jansky [id number]     *If you need refills on other medications prior to your next appointment, please contact your pharmacy*  Follow-Up: Call back or seek an in-person evaluation if the symptoms worsen or if the condition fails to improve as anticipated.  [facility] [phone]  Other Instructions    If you have been instructed to have an in-person evaluation today at a local Urgent Care facility, please use the link below. It will take you to a list of all of our available [facility], including address, phone number and hours of operation. Please do not delay care.  [facility]  If you or a family member do not have a primary care provider, use the link below to schedule a visit and establish care. When you choose a [facility] primary care physician or advanced practice provider, you gain a long-term partner in health. Find a Primary Care Provider  Learn more about [facility]'s in-office and virtual care options: [facility] - Get Care Now

## 2023-03-10 NOTE — Progress Notes (Signed)
Virtual Visit Consent   Angela [name], you are scheduled for a virtual visit with a [facility] provider today. Just as with appointments in the office, your consent must be obtained to participate. Your consent will be active for this visit and any virtual visit you may have with one of our providers in the next 365 days. If you have a MyChart account, a copy of this consent can be sent to you electronically.  As this is a virtual visit, video technology does not allow for your provider to perform a traditional examination. This may limit your provider's ability to fully assess your condition. If your provider identifies any concerns that need to be evaluated in person or the need to arrange testing (such as labs, EKG, etc.), we will make arrangements to do so. Although advances in technology are sophisticated, we cannot ensure that it will always work on either your end or our end. If the connection with a video visit is poor, the visit may have to be switched to a telephone visit. With either a video or telephone visit, we are not always able to ensure that we have a secure connection.  By engaging in this virtual visit, you consent to the provision of healthcare and authorize for your insurance to be billed (if applicable) for the services provided during this visit. Depending on your insurance coverage, you may receive a charge related to this service.  I need to obtain your verbal consent now. Are you willing to proceed with your visit today? Amanie Bethea Nobile has provided verbal consent on 03/10/2023 for a virtual visit (video or telephone). Roxy Horseman, PA-C  Date: 03/10/2023 11:38 AM  Virtual Visit via Video Note   I, Roxy Horseman, connected with  Angela [name]  ([id number], 1975/09/13) on 03/10/23 at 11:30 AM EDT by a video-enabled telemedicine application and verified that I am speaking with the correct person using two identifiers.  Location: Patient: Virtual Visit Location  Patient: Home Provider: Virtual Visit Location Provider: Home Office   I discussed the limitations of evaluation and management by telemedicine and the availability of in person appointments. The patient expressed understanding and agreed to proceed.    History of Present Illness: Angela [name] is a 47 y.o. who identifies as a female who was assigned female at birth, and is being seen today for cough and congestion.  States that the mucous is very thick.  States that symptoms started a couple of weeks ago.  States that she is having ear pain and sinus fullness and pressure.  Reports having coughing spells at night.  Has tried OTC meds without relief.  HPI: HPI  Problems:  Patient Active Problem List   Diagnosis Date Noted   Vitamin D deficiency 07/09/2021   Major depressive disorder, recurrent episode, moderate (HCC) 05/16/2020   AKI (acute kidney injury) (HCC) 12/31/2019   Asthma 12/31/2019   Acquired hypothyroidism 12/31/2019   Acute pulmonary embolism (HCC) 12/30/2019   Essential hypertension 07/08/2018   Morbid obesity (HCC) 07/08/2018   Neuropathy of both feet 07/08/2018   Primary osteoarthritis of both knees 04/02/2018   Foot pain, right 04/02/2018   Chronic maxillary sinusitis 09/18/2017   HTN (hypertension), benign 12/17/2016   Pain, pelvic, female 02/05/2012   Fibroids 02/05/2012   Abnormal uterine bleeding 02/05/2012    Allergies:  Allergies  Allergen Reactions   Doxycycline Nausea And Vomiting   Flagyl [Metronidazole Hcl] Hives and Nausea And Vomiting   Medications:  Current Outpatient Medications:  amoxicillin-clavulanate (AUGMENTIN) 875-125 MG tablet, Take 1 tablet by mouth every 12 (twelve) hours., Disp: 14 tablet, Rfl: 0   benzonatate (TESSALON) 100 MG capsule, Take 1 capsule (100 mg total) by mouth 2 (two) times daily as needed for cough., Disp: 20 capsule, Rfl: 0   albuterol (PROVENTIL) (2.5 MG/3ML) 0.083% nebulizer solution, Take 3 mLs (2.5 mg total) by  nebulization every 6 (six) hours as needed for wheezing or shortness of breath., Disp: 75 mL, Rfl: 1   amLODipine (NORVASC) 10 MG tablet, TAKE 1 TABLET(10 MG) BY MOUTH DAILY, Disp: 90 tablet, Rfl: 1   azithromycin (ZITHROMAX Z-PAK) 250 MG tablet, 2 tabs PO x 1 then 1 tab PO daily, Disp: 6 each, Rfl: 0   diclofenac Sodium (VOLTAREN) 1 % GEL, Apply 2 g topically 4 (four) times daily., Disp: 100 g, Rfl: 3   ELIQUIS 5 MG TABS tablet, TAKE 1 TABLET(5 MG) BY MOUTH TWICE DAILY, Disp: 60 tablet, Rfl: 3   ferrous sulfate 325 (65 FE) MG tablet, Take 1 tablet (325 mg total) by mouth daily with breakfast., Disp: 100 tablet, Rfl: 1   fluconazole (DIFLUCAN) 150 MG tablet, Take 1 tablet (150 mg total) by mouth daily., Disp: 1 tablet, Rfl: 0   fluticasone (FLONASE) 50 MCG/ACT nasal spray, Place 1 spray into both nostrils daily., Disp: 16 g, Rfl: 2   hydrochlorothiazide (HYDRODIURIL) 25 MG tablet, Take 1 tablet (25 mg total) by mouth daily., Disp: 90 tablet, Rfl: 3   labetalol (NORMODYNE) 300 MG tablet, TAKE 1 TABLET(300 MG) BY MOUTH TWICE DAILY, Disp: 180 tablet, Rfl: 2   levothyroxine (SYNTHROID) 25 MCG tablet, TAKE 1/2 TABLET(12.5 MCG) BY MOUTH DAILY AT 6 AM, Disp: 45 tablet, Rfl: 1   loratadine (CLARITIN) 10 MG tablet, TAKE 1 TABLET(10 MG) BY MOUTH DAILY, Disp: 90 tablet, Rfl: 0   mometasone-formoterol (DULERA) 100-5 MCG/ACT AERO, Inhale 2 puffs into the lungs 2 (two) times daily., Disp: 13 g, Rfl: 3   predniSONE (DELTASONE) 20 MG tablet, Take 1 tablet (20 mg total) by mouth daily with breakfast., Disp: 4 tablet, Rfl: 0   rosuvastatin (CRESTOR) 5 MG tablet, TAKE 1 TABLET(5 MG) BY MOUTH DAILY, Disp: 90 tablet, Rfl: 0   Semaglutide-Weight Management (WEGOVY) 0.25 MG/0.5ML SOAJ, Inject 0.25 mg into the skin once a week., Disp: 2 mL, Rfl: 1   solifenacin (VESICARE) 5 MG tablet, TAKE 1 TABLET BY MOUTH EVERY DAY(URINE INCONTINENCE), Disp: 30 tablet, Rfl: 0   triamcinolone cream (KENALOG) 0.1 %, Apply 1 Application  topically 2 (two) times daily., Disp: 30 g, Rfl: 0   VENTOLIN HFA 108 (90 Base) MCG/ACT inhaler, INHALE 1 TO 2 PUFFS INTO THE LUNGS EVERY 6 HOURS AS NEEDED FOR WHEEZING OR SHORTNESS OF BREATH, Disp: 18 g, Rfl: 2   Vitamin D, Ergocalciferol, (DRISDOL) 1.25 MG (50000 UNIT) CAPS capsule, Take 1 capsule (50,000 Units total) by mouth once a week., Disp: 12 capsule, Rfl: 1  Observations/Objective: Patient is well-developed, well-nourished in no acute distress.  Resting comfortably  at home.  Head is normocephelic, atraumatic.  No labored breathing.  Speech is clear and coherent with logical content.  Patient is alert and oriented at baseline.    Assessment and Plan: 1. Sinusitis, unspecified chronicity, unspecified location   Meds ordered this encounter  Medications   amoxicillin-clavulanate (AUGMENTIN) 875-125 MG tablet    Sig: Take 1 tablet by mouth every 12 (twelve) hours.    Dispense:  14 tablet    Refill:  0    Order  Specific Question:   Supervising Provider    Answer:   Merrilee Jansky [[id number]]   benzonatate (TESSALON) 100 MG capsule    Sig: Take 1 capsule (100 mg total) by mouth 2 (two) times daily as needed for cough.    Dispense:  20 capsule    Refill:  0    Order Specific Question:   Supervising Provider    Answer:   Merrilee Jansky [id number]    Cough and sinus congestion that has been worsening over the past 2 weeks.  Will treat for sinusitis. In-person follow-up if not improving.  Follow Up Instructions: I discussed the assessment and treatment plan with the patient. The patient was provided an opportunity to ask questions and all were answered. The patient agreed with the plan and demonstrated an understanding of the instructions.  A copy of instructions were sent to the patient via MyChart unless otherwise noted below.     The patient was advised to call back or seek an in-person evaluation if the symptoms worsen or if the condition fails to improve as  anticipated.  Time:  I spent 12 minutes with the patient via telehealth technology discussing the above problems/concerns.    Roxy Horseman, PA-C

## 2023-03-11 ENCOUNTER — Telehealth (HOSPITAL_BASED_OUTPATIENT_CLINIC_OR_DEPARTMENT_OTHER): Payer: Medicaid Other | Admitting: Internal Medicine

## 2023-03-11 DIAGNOSIS — N3946 Mixed incontinence: Secondary | ICD-10-CM

## 2023-03-11 DIAGNOSIS — Z029 Encounter for administrative examinations, unspecified: Secondary | ICD-10-CM

## 2023-03-11 NOTE — Progress Notes (Signed)
Patient ID: Angela [name], female   DOB: 1975-06-23, 47 y.o.   MRN: [id number] Virtual Visit via Video Note  I connected with Angela [name] on 03/11/2023 at 8:11 AM by a video enabled telemedicine application and verified that I am speaking with the correct person using two identifiers.  Location: Patient: home Provider: Office   I discussed the limitations of evaluation and management by telemedicine and the availability of in person appointments. The patient expressed understanding and agreed to proceed.  History of Present Illness: Hx of HTN, HL, morbid obesity, pre-DM, OA knees, hypothyroid, unprovoked BL PE, asthmatic bronchitis.    This visit was scheduled so that we can recertify her with aero flow to continue getting urinary incontinence supplies.  Patient has history of mixed urinary incontinence.  When she gets the urge to urinate she is unable to hold it until she gets to the restroom.  Sometimes has accidents in the bed.  When she coughs or laughs, she also has incontinence.  She has history of morbid obesity.  She is requesting incontinence underwear, disposable pads for her bed known as chux and nonsterile gloves.  Outpatient Encounter Medications as of 03/11/2023  Medication Sig Note   albuterol (PROVENTIL) (2.5 MG/3ML) 0.083% nebulizer solution Take 3 mLs (2.5 mg total) by nebulization every 6 (six) hours as needed for wheezing or shortness of breath.    amLODipine (NORVASC) 10 MG tablet TAKE 1 TABLET(10 MG) BY MOUTH DAILY    amoxicillin-clavulanate (AUGMENTIN) 875-125 MG tablet Take 1 tablet by mouth every 12 (twelve) hours.    azithromycin (ZITHROMAX Z-PAK) 250 MG tablet 2 tabs PO x 1 then 1 tab PO daily    benzonatate (TESSALON) 100 MG capsule Take 1 capsule (100 mg total) by mouth 2 (two) times daily as needed for cough.    diclofenac Sodium (VOLTAREN) 1 % GEL Apply 2 g topically 4 (four) times daily.    ELIQUIS 5 MG TABS tablet TAKE 1 TABLET(5 MG) BY MOUTH TWICE DAILY     ferrous sulfate 325 (65 FE) MG tablet Take 1 tablet (325 mg total) by mouth daily with breakfast.    fluconazole (DIFLUCAN) 150 MG tablet Take 1 tablet (150 mg total) by mouth daily.    fluticasone (FLONASE) 50 MCG/ACT nasal spray Place 1 spray into both nostrils daily. 10/24/2022: May require refills   hydrochlorothiazide (HYDRODIURIL) 25 MG tablet Take 1 tablet (25 mg total) by mouth daily.    labetalol (NORMODYNE) 300 MG tablet TAKE 1 TABLET(300 MG) BY MOUTH TWICE DAILY    levothyroxine (SYNTHROID) 25 MCG tablet TAKE 1/2 TABLET(12.5 MCG) BY MOUTH DAILY AT 6 AM    loratadine (CLARITIN) 10 MG tablet TAKE 1 TABLET(10 MG) BY MOUTH DAILY 10/24/2022: Refills needed   mometasone-formoterol (DULERA) 100-5 MCG/ACT AERO Inhale 2 puffs into the lungs 2 (two) times daily.    predniSONE (DELTASONE) 20 MG tablet Take 1 tablet (20 mg total) by mouth daily with breakfast.    rosuvastatin (CRESTOR) 5 MG tablet TAKE 1 TABLET(5 MG) BY MOUTH DAILY    Semaglutide-Weight Management (WEGOVY) 0.25 MG/0.5ML SOAJ Inject 0.25 mg into the skin once a week.    solifenacin (VESICARE) 5 MG tablet TAKE 1 TABLET BY MOUTH EVERY DAY(URINE INCONTINENCE)    triamcinolone cream (KENALOG) 0.1 % Apply 1 Application topically 2 (two) times daily.    VENTOLIN HFA 108 (90 Base) MCG/ACT inhaler INHALE 1 TO 2 PUFFS INTO THE LUNGS EVERY 6 HOURS AS NEEDED FOR WHEEZING OR SHORTNESS OF  BREATH    Vitamin D, Ergocalciferol, (DRISDOL) 1.25 MG (50000 UNIT) CAPS capsule Take 1 capsule (50,000 Units total) by mouth once a week.    No facility-administered encounter medications on file as of 03/11/2023.      Observations/Objective: Middle-age obese African-American female in NAD  Assessment and Plan: 1. Administrative encounter 2. Urinary incontinence, mixed -Form will be completed for [employer] and faxed   Follow Up Instructions: PRN   I discussed the assessment and treatment plan with the patient. The patient was provided an  opportunity to ask questions and all were answered. The patient agreed with the plan and demonstrated an understanding of the instructions.   The patient was advised to call back or seek an in-person evaluation if the symptoms worsen or if the condition fails to improve as anticipated.  I spent 5 minutes dedicated to the care of this patient on the date of this encounter to include previsit review of of records, face-to-face time with the patient discussing diagnosis and postvisit completion of form.  This note has been created with Education officer, environmental. Any transcriptional errors are unintentional.  Jonah Blue, MD

## 2023-03-12 ENCOUNTER — Encounter: Payer: Self-pay | Admitting: Internal Medicine

## 2023-03-21 ENCOUNTER — Other Ambulatory Visit: Payer: Self-pay | Admitting: Internal Medicine

## 2023-03-21 DIAGNOSIS — J4520 Mild intermittent asthma, uncomplicated: Secondary | ICD-10-CM

## 2023-03-21 DIAGNOSIS — J4541 Moderate persistent asthma with (acute) exacerbation: Secondary | ICD-10-CM

## 2023-03-21 NOTE — Telephone Encounter (Signed)
Requested Prescriptions  Pending Prescriptions Disp Refills   albuterol (PROVENTIL) (2.5 MG/3ML) 0.083% nebulizer solution [Pharmacy Med Name: ALBUTEROL 0.083%(2.5MG /3ML) 25X3ML] 75 mL 1    Sig: INHALE CONTENTS OF 1 VIAL PER NEBULIZER EVERY 6 HOURS AS NEEDED FOR WHEEZING OR SHORTNESS OF BREATH     Pulmonology:  Beta Agonists 2 Passed - 03/21/2023  1:12 PM      Passed - Last BP in normal range    BP Readings from Last 1 Encounters:  10/25/22 124/76         Passed - Last Heart Rate in normal range    Pulse Readings from Last 1 Encounters:  10/25/22 68         Passed - Valid encounter within last 12 months    Recent Outpatient Visits           1 week ago Urinary incontinence, mixed   [facility] Marcine Matar, MD   4 months ago Essential hypertension   [place] [facility] Jonah Blue B, MD   8 months ago Moderate persistent asthmatic bronchitis with acute exacerbation   [place] [facility] Marcine Matar, MD   11 months ago Bronchitis with asthma, acute   [place] [facility] Marcine Matar, MD   1 year ago Urinary incontinence, mixed   [facility] Marcine Matar, MD               DULERA 100-5 MCG/ACT AERO [Pharmacy Med Name: DULERA 100-5MCG ORAL INHALER 120INH] 13 g 3    Sig: INHALE 2 PUFFS INTO THE LUNGS TWICE DAILY     Pulmonology:  [employer] Passed - 03/21/2023  1:12 PM      Passed - Valid encounter within last 12 months    Recent Outpatient Visits           1 week ago Urinary incontinence, mixed   [place] [facility] Marcine Matar, MD   4 months ago Essential hypertension   [place] [facility] Jonah Blue B, MD   8 months ago Moderate persistent asthmatic bronchitis with acute exacerbation   [facility] Marcine Matar, MD   11 months ago Bronchitis with asthma, acute   [place] [facility] Marcine Matar, MD   1 year ago Urinary incontinence, mixed   [place] [facility] Marcine Matar, MD

## 2023-03-29 DIAGNOSIS — R32 Unspecified urinary incontinence: Secondary | ICD-10-CM | POA: Diagnosis not present

## 2023-04-04 DIAGNOSIS — Z419 Encounter for procedure for purposes other than remedying health state, unspecified: Secondary | ICD-10-CM | POA: Diagnosis not present

## 2023-04-18 DIAGNOSIS — R32 Unspecified urinary incontinence: Secondary | ICD-10-CM | POA: Diagnosis not present

## 2023-04-29 ENCOUNTER — Telehealth: Payer: Medicaid Other | Admitting: Family Medicine

## 2023-04-29 DIAGNOSIS — J029 Acute pharyngitis, unspecified: Secondary | ICD-10-CM | POA: Diagnosis not present

## 2023-04-29 DIAGNOSIS — Z20818 Contact with and (suspected) exposure to other bacterial communicable diseases: Secondary | ICD-10-CM

## 2023-04-29 MED ORDER — LIDOCAINE VISCOUS HCL 2 % MT SOLN: 15.0000 mL | OROMUCOSAL | 0 refills | Status: DC | PRN

## 2023-04-29 MED ORDER — CEPHALEXIN 500 MG PO CAPS: 500.0000 mg | ORAL_CAPSULE | Freq: Two times a day (BID) | ORAL | 0 refills | Status: AC

## 2023-04-29 NOTE — Progress Notes (Signed)
Virtual Visit Consent   Angela [name], you are scheduled for a virtual visit with a [facility] provider today. Just as with appointments in the office, your consent must be obtained to participate. Your consent will be active for this visit and any virtual visit you may have with one of our providers in the next 365 days. If you have a MyChart account, a copy of this consent can be sent to you electronically.  As this is a virtual visit, video technology does not allow for your provider to perform a traditional examination. This may limit your provider's ability to fully assess your condition. If your provider identifies any concerns that need to be evaluated in person or the need to arrange testing (such as labs, EKG, etc.), we will make arrangements to do so. Although advances in technology are sophisticated, we cannot ensure that it will always work on either your end or our end. If the connection with a video visit is poor, the visit may have to be switched to a telephone visit. With either a video or telephone visit, we are not always able to ensure that we have a secure connection.  By engaging in this virtual visit, you consent to the provision of healthcare and authorize for your insurance to be billed (if applicable) for the services provided during this visit. Depending on your insurance coverage, you may receive a charge related to this service.  I need to obtain your verbal consent now. Are you willing to proceed with your visit today? Angela [name] has provided verbal consent on 04/29/2023 for a virtual visit (video or telephone). Freddy Finner, NP  Date: 04/29/2023 12:33 PM  Virtual Visit via Video Note   I, Freddy Finner, connected with  Angela [name]  ([id number], Dec 13, 1975) on 04/29/23 at 12:30 PM EST by a video-enabled telemedicine application and verified that I am speaking with the correct person using two identifiers.  Location: Patient: Virtual Visit Location  Patient: Home Provider: Virtual Visit Location Provider: Home Office   I discussed the limitations of evaluation and management by telemedicine and the availability of in person appointments. The patient expressed understanding and agreed to proceed.    History of Present Illness: Angela [name] is a 47 y.o. who identifies as a female who was assigned female at birth, and is being seen today for strep  Onset was 3-4 days with sore throat- her tongue is sore and tender as well, swelling of glands as well Associated symptoms are ear pain and tenderness of neck, some mucus, chills Modifying factors are nothing outside tylenol Denies chest pain, shortness of breath, fevers,  Exposure to sick contacts- known- daughter was just treated for strep COVID test: no  Problems:  Patient Active Problem List   Diagnosis Date Noted   Vitamin D deficiency 07/09/2021   Major depressive disorder, recurrent episode, moderate (HCC) 05/16/2020   AKI (acute kidney injury) (HCC) 12/31/2019   Asthma 12/31/2019   Acquired hypothyroidism 12/31/2019   Acute pulmonary embolism (HCC) 12/30/2019   Essential hypertension 07/08/2018   Morbid obesity (HCC) 07/08/2018   Neuropathy of both feet 07/08/2018   Primary osteoarthritis of both knees 04/02/2018   Foot pain, right 04/02/2018   Chronic maxillary sinusitis 09/18/2017   HTN (hypertension), benign 12/17/2016   Pain, pelvic, female 02/05/2012   Fibroids 02/05/2012   Abnormal uterine bleeding 02/05/2012    Allergies:  Allergies  Allergen Reactions   Doxycycline Nausea And Vomiting   Flagyl [Metronidazole  Hcl] Hives and Nausea And Vomiting   Medications:  Current Outpatient Medications:    albuterol (PROVENTIL) (2.5 MG/3ML) 0.083% nebulizer solution, INHALE CONTENTS OF 1 VIAL PER NEBULIZER EVERY 6 HOURS AS NEEDED FOR WHEEZING OR SHORTNESS OF BREATH, Disp: 75 mL, Rfl: 1   amLODipine (NORVASC) 10 MG tablet, TAKE 1 TABLET(10 MG) BY MOUTH DAILY, Disp: 90  tablet, Rfl: 1   amoxicillin-clavulanate (AUGMENTIN) 875-125 MG tablet, Take 1 tablet by mouth every 12 (twelve) hours., Disp: 14 tablet, Rfl: 0   azithromycin (ZITHROMAX Z-PAK) 250 MG tablet, 2 tabs PO x 1 then 1 tab PO daily, Disp: 6 each, Rfl: 0   benzonatate (TESSALON) 100 MG capsule, Take 1 capsule (100 mg total) by mouth 2 (two) times daily as needed for cough., Disp: 20 capsule, Rfl: 0   diclofenac Sodium (VOLTAREN) 1 % GEL, Apply 2 g topically 4 (four) times daily., Disp: 100 g, Rfl: 3   DULERA 100-5 MCG/ACT AERO, INHALE 2 PUFFS INTO THE LUNGS TWICE DAILY, Disp: 13 g, Rfl: 3   ELIQUIS 5 MG TABS tablet, TAKE 1 TABLET(5 MG) BY MOUTH TWICE DAILY, Disp: 60 tablet, Rfl: 3   ferrous sulfate 325 (65 FE) MG tablet, Take 1 tablet (325 mg total) by mouth daily with breakfast., Disp: 100 tablet, Rfl: 1   fluconazole (DIFLUCAN) 150 MG tablet, Take 1 tablet (150 mg total) by mouth daily., Disp: 1 tablet, Rfl: 0   fluticasone (FLONASE) 50 MCG/ACT nasal spray, Place 1 spray into both nostrils daily., Disp: 16 g, Rfl: 2   hydrochlorothiazide (HYDRODIURIL) 25 MG tablet, Take 1 tablet (25 mg total) by mouth daily., Disp: 90 tablet, Rfl: 3   labetalol (NORMODYNE) 300 MG tablet, TAKE 1 TABLET(300 MG) BY MOUTH TWICE DAILY, Disp: 180 tablet, Rfl: 2   levothyroxine (SYNTHROID) 25 MCG tablet, TAKE 1/2 TABLET(12.5 MCG) BY MOUTH DAILY AT 6 AM, Disp: 45 tablet, Rfl: 1   loratadine (CLARITIN) 10 MG tablet, TAKE 1 TABLET(10 MG) BY MOUTH DAILY, Disp: 90 tablet, Rfl: 0   predniSONE (DELTASONE) 20 MG tablet, Take 1 tablet (20 mg total) by mouth daily with breakfast., Disp: 4 tablet, Rfl: 0   rosuvastatin (CRESTOR) 5 MG tablet, TAKE 1 TABLET(5 MG) BY MOUTH DAILY, Disp: 90 tablet, Rfl: 0   Semaglutide-Weight Management (WEGOVY) 0.25 MG/0.5ML SOAJ, Inject 0.25 mg into the skin once a week., Disp: 2 mL, Rfl: 1   solifenacin (VESICARE) 5 MG tablet, TAKE 1 TABLET BY MOUTH EVERY DAY(URINE INCONTINENCE), Disp: 30 tablet, Rfl: 0    triamcinolone cream (KENALOG) 0.1 %, Apply 1 Application topically 2 (two) times daily., Disp: 30 g, Rfl: 0   VENTOLIN HFA 108 (90 Base) MCG/ACT inhaler, INHALE 1 TO 2 PUFFS INTO THE LUNGS EVERY 6 HOURS AS NEEDED FOR WHEEZING OR SHORTNESS OF BREATH, Disp: 18 g, Rfl: 2   Vitamin D, Ergocalciferol, (DRISDOL) 1.25 MG (50000 UNIT) CAPS capsule, Take 1 capsule (50,000 Units total) by mouth once a week., Disp: 12 capsule, Rfl: 1  Observations/Objective: Patient is well-developed, well-nourished in no acute distress.  Resting comfortably  at home.  Head is normocephalic, atraumatic.  No labored breathing.  Speech is clear and coherent with logical content.  Patient is alert and oriented at baseline.    Assessment and Plan:  1. Strep throat exposure  - cephALEXin (KEFLEX) 500 MG capsule; Take 1 capsule (500 mg total) by mouth 2 (two) times daily for 7 days.  Dispense: 14 capsule; Refill: 0  2. Sore throat  - cephALEXin (  KEFLEX) 500 MG capsule; Take 1 capsule (500 mg total) by mouth 2 (two) times daily for 7 days.  Dispense: 14 capsule; Refill: 0 - lidocaine (XYLOCAINE) 2 % solution; Use as directed 15 mLs in the mouth or throat as needed for mouth pain.  Dispense: 100 mL; Refill: 0   -Suspect bacterial pharyngitis - Discussed OTC management - Will prescribe Viscous lidocaine for symptomatic relief, along with ABX  for bacterial cover - May use tylenol and ibuprofen alternating every 3-4 hours as needed for pain, fevers, body aches - Salt water gargles - Hydration and voice rest - Seek in person evaluation if worsening or fails to improve  Reviewed side effects, risks and benefits of medication.    Patient acknowledged agreement and understanding of the plan.   Past Medical, Surgical, Social History, Allergies, and Medications have been Reviewed.    Follow Up Instructions: I discussed the assessment and treatment plan with the patient. The patient was provided an opportunity to ask  questions and all were answered. The patient agreed with the plan and demonstrated an understanding of the instructions.  A copy of instructions were sent to the patient via MyChart unless otherwise noted below.    The patient was advised to call back or seek an in-person evaluation if the symptoms worsen or if the condition fails to improve as anticipated.    Freddy Finner, NP

## 2023-04-29 NOTE — Patient Instructions (Addendum)
Fabian Sharp, thank you for joining Freddy Finner, NP for today's virtual visit.  While this provider is not your primary care provider (PCP), if your PCP is located in our provider database this encounter information will be shared with them immediately following your visit.   A [facility] MyChart account gives you access to today's visit and all your visits, tests, and labs performed at [facility] " click here if you don't have a [facility] MyChart account or go to [email or web address]  Consent: (Patient) Angela [name] provided verbal consent for this virtual visit at the beginning of the encounter.  Current Medications:  Current Outpatient Medications:    albuterol (PROVENTIL) (2.5 MG/3ML) 0.083% nebulizer solution, INHALE CONTENTS OF 1 VIAL PER NEBULIZER EVERY 6 HOURS AS NEEDED FOR WHEEZING OR SHORTNESS OF BREATH, Disp: 75 mL, Rfl: 1   amLODipine (NORVASC) 10 MG tablet, TAKE 1 TABLET(10 MG) BY MOUTH DAILY, Disp: 90 tablet, Rfl: 1   benzonatate (TESSALON) 100 MG capsule, Take 1 capsule (100 mg total) by mouth 2 (two) times daily as needed for cough., Disp: 20 capsule, Rfl: 0   cephALEXin (KEFLEX) 500 MG capsule, Take 1 capsule (500 mg total) by mouth 2 (two) times daily for 7 days., Disp: 14 capsule, Rfl: 0   diclofenac Sodium (VOLTAREN) 1 % GEL, Apply 2 g topically 4 (four) times daily., Disp: 100 g, Rfl: 3   DULERA 100-5 MCG/ACT AERO, INHALE 2 PUFFS INTO THE LUNGS TWICE DAILY, Disp: 13 g, Rfl: 3   ELIQUIS 5 MG TABS tablet, TAKE 1 TABLET(5 MG) BY MOUTH TWICE DAILY, Disp: 60 tablet, Rfl: 3   ferrous sulfate 325 (65 FE) MG tablet, Take 1 tablet (325 mg total) by mouth daily with breakfast., Disp: 100 tablet, Rfl: 1   fluconazole (DIFLUCAN) 150 MG tablet, Take 1 tablet (150 mg total) by mouth daily., Disp: 1 tablet, Rfl: 0   fluticasone (FLONASE) 50 MCG/ACT nasal spray, Place 1 spray into both nostrils daily., Disp: 16 g, Rfl: 2   hydrochlorothiazide (HYDRODIURIL)  25 MG tablet, Take 1 tablet (25 mg total) by mouth daily., Disp: 90 tablet, Rfl: 3   labetalol (NORMODYNE) 300 MG tablet, TAKE 1 TABLET(300 MG) BY MOUTH TWICE DAILY, Disp: 180 tablet, Rfl: 2   levothyroxine (SYNTHROID) 25 MCG tablet, TAKE 1/2 TABLET(12.5 MCG) BY MOUTH DAILY AT 6 AM, Disp: 45 tablet, Rfl: 1   lidocaine (XYLOCAINE) 2 % solution, Use as directed 15 mLs in the mouth or throat as needed for mouth pain., Disp: 100 mL, Rfl: 0   loratadine (CLARITIN) 10 MG tablet, TAKE 1 TABLET(10 MG) BY MOUTH DAILY, Disp: 90 tablet, Rfl: 0   predniSONE (DELTASONE) 20 MG tablet, Take 1 tablet (20 mg total) by mouth daily with breakfast., Disp: 4 tablet, Rfl: 0   rosuvastatin (CRESTOR) 5 MG tablet, TAKE 1 TABLET(5 MG) BY MOUTH DAILY, Disp: 90 tablet, Rfl: 0   Semaglutide-Weight Management (WEGOVY) 0.25 MG/0.5ML SOAJ, Inject 0.25 mg into the skin once a week., Disp: 2 mL, Rfl: 1   solifenacin (VESICARE) 5 MG tablet, TAKE 1 TABLET BY MOUTH EVERY DAY(URINE INCONTINENCE), Disp: 30 tablet, Rfl: 0   triamcinolone cream (KENALOG) 0.1 %, Apply 1 Application topically 2 (two) times daily., Disp: 30 g, Rfl: 0   VENTOLIN HFA 108 (90 Base) MCG/ACT inhaler, INHALE 1 TO 2 PUFFS INTO THE LUNGS EVERY 6 HOURS AS NEEDED FOR WHEEZING OR SHORTNESS OF BREATH, Disp: 18 g, Rfl: 2   Vitamin D, Ergocalciferol, (DRISDOL) 1.25  MG (50000 UNIT) CAPS capsule, Take 1 capsule (50,000 Units total) by mouth once a week., Disp: 12 capsule, Rfl: 1   Medications ordered in this encounter:  Meds ordered this encounter  Medications   cephALEXin (KEFLEX) 500 MG capsule    Sig: Take 1 capsule (500 mg total) by mouth 2 (two) times daily for 7 days.    Dispense:  14 capsule    Refill:  0    Order Specific Question:   Supervising Provider    Answer:   Merrilee Jansky [[id number]]   lidocaine (XYLOCAINE) 2 % solution    Sig: Use as directed 15 mLs in the mouth or throat as needed for mouth pain.    Dispense:  100 mL    Refill:  0    Order  Specific Question:   Supervising Provider    Answer:   Merrilee Jansky [[id number]]     *If you need refills on other medications prior to your next appointment, please contact your pharmacy*  Follow-Up: Call back or seek an in-person evaluation if the symptoms worsen or if the condition fails to improve as anticipated.  [facility] [phone]  Other Instructions   -Suspect bacterial pharyngitis - Discussed OTC management - Will prescribe Viscous lidocaine for symptomatic relief - May use tylenol and ibuprofen alternating every 3-4 hours as needed for pain, fevers, body aches - Salt water gargles - Hydration and voice rest - Seek in person evaluation if worsening or fails to improve   If you have been instructed to have an in-person evaluation today at a local Urgent Care facility, please use the link below. It will take you to a list of all of our available [facility], including address, phone number and hours of operation. Please do not delay care.  [facility]  If you or a family member do not have a primary care provider, use the link below to schedule a visit and establish care. When you choose a [facility] primary care physician or advanced practice provider, you gain a long-term partner in health. Find a Primary Care Provider  Learn more about [facility]'s in-office and virtual care options: [facility] Get Care Now

## 2023-05-04 DIAGNOSIS — Z419 Encounter for procedure for purposes other than remedying health state, unspecified: Secondary | ICD-10-CM | POA: Diagnosis not present

## 2023-06-03 DIAGNOSIS — R32 Unspecified urinary incontinence: Secondary | ICD-10-CM | POA: Diagnosis not present

## 2023-06-04 DIAGNOSIS — Z419 Encounter for procedure for purposes other than remedying health state, unspecified: Secondary | ICD-10-CM | POA: Diagnosis not present

## 2023-07-05 DEATH — deceased

## 2023-07-10 ENCOUNTER — Telehealth: Payer: Self-pay

## 2023-07-10 NOTE — Telephone Encounter (Signed)
 Copied from CRM [id number]. Topic: General - Deceased Patient >> 08/08/2023 10:14 AM Wyona SQUIBB wrote: Name of caller: Lamont Landau  Date of death: Jul 27, 2023   Name of funeral home: [facility]  Phone number of funeral home: [phone]  Provider that needs to sign form: Barnie KATHEE Louder   Timeline for signing: ASAP, in [facility]

## 2023-07-10 NOTE — Telephone Encounter (Signed)
 PC placed to funeral home.  Pt died July 06, 2023 with time of death 6:26 a.m. Person from funeral home said pt died in sleep. I called pt's daughter Angela [name] [phone] to get more info so that I can complete death certificate. She told me that a few days prior to her death pt c/o SOB and lips and fingers were blue; she was sleeping a lot and barely eating.  RT leg was swollen.  She did not c/o CP, cough. No fever.  Daughter states she wanted to take pt to ER but pt continued to refuse to go.  Daughter was called by her niece more that pt died to report that pt was laying down and breathy heavy.  She told her niece to call 911. When she got there, EMT told her that her mother most likely died of heart attack. I gave my condolences to the daughter and family. Death certificate completed. I suspect pt had asthma exacerbation +/- PE.

## 2023-07-21 ENCOUNTER — Other Ambulatory Visit: Payer: Self-pay | Admitting: Internal Medicine

## 2023-07-21 DIAGNOSIS — I10 Essential (primary) hypertension: Secondary | ICD-10-CM

## 2024-01-02 NOTE — Procedures (Signed)
 SABRA

## 2024-01-05 IMAGING — CR DG KNEE COMPLETE 4+V*L*
4 series · 4 of 4 positions shown · non-contrast
Comparison: X-ray left knee 11/10/2019.

CLINICAL DATA: pain

EXAM:
LEFT KNEE - COMPLETE 4+ VIEW

[knee ap]
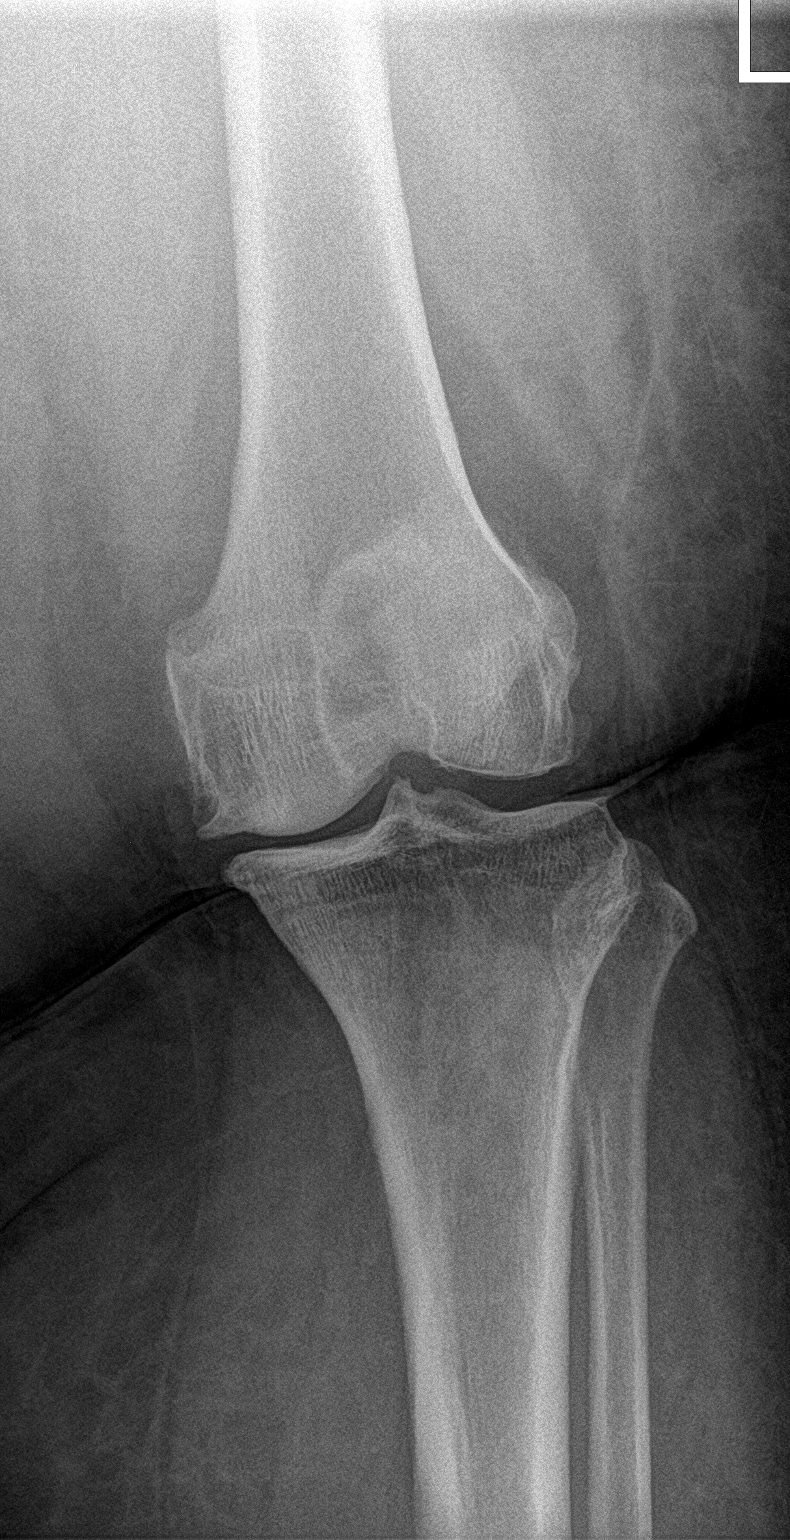

[knee lat]
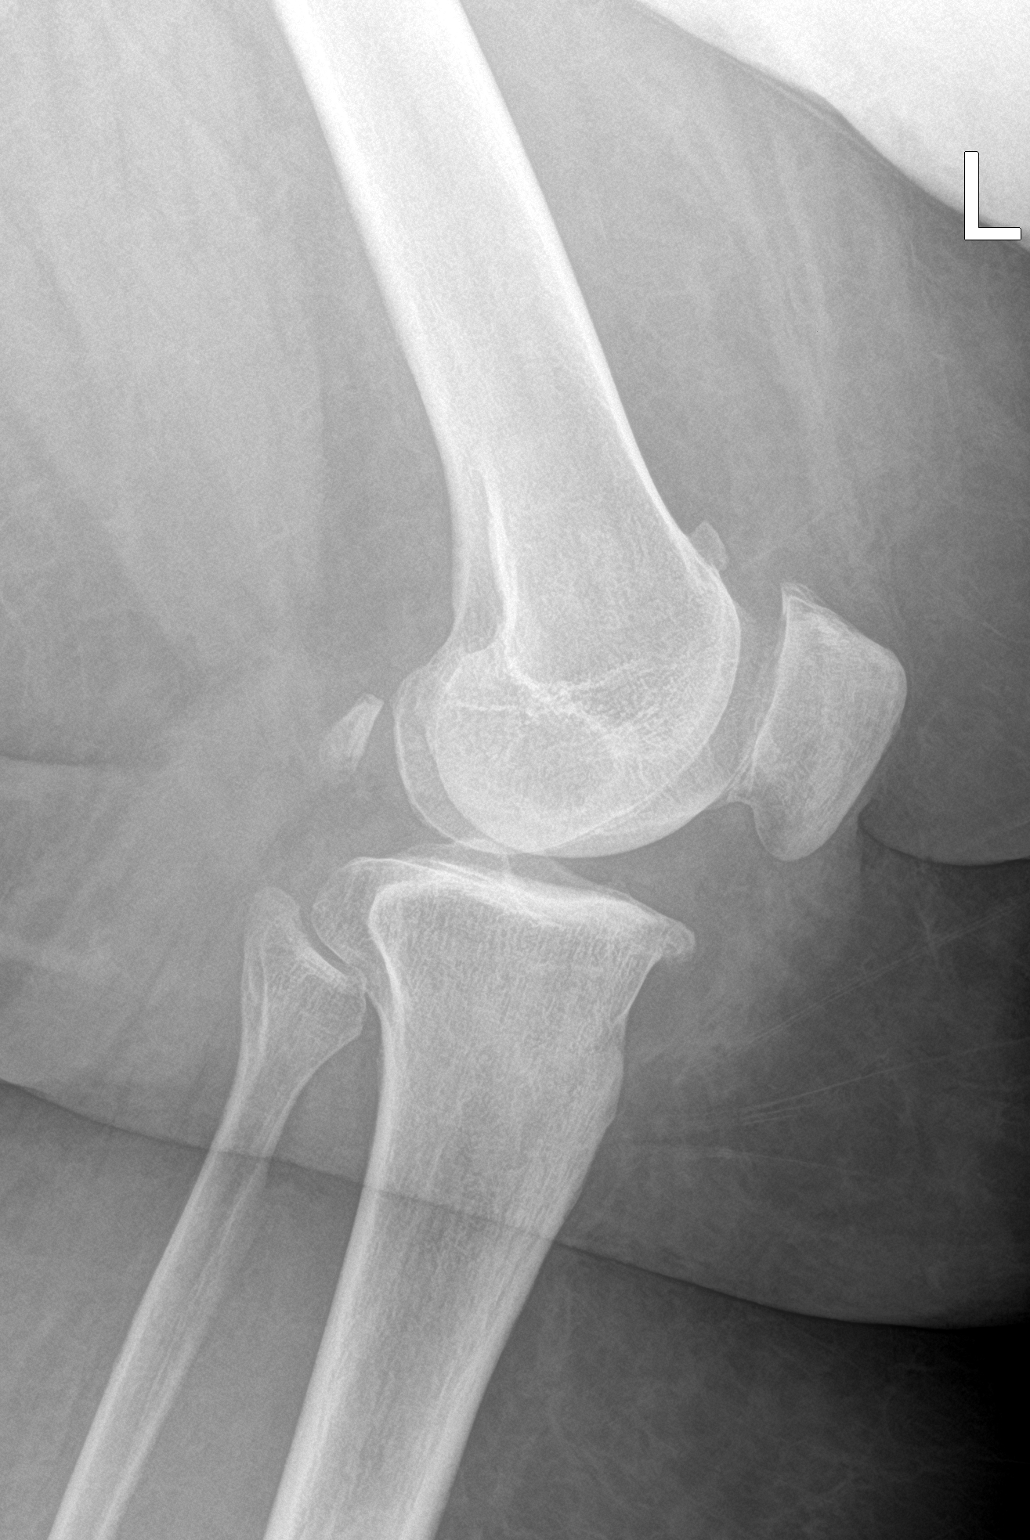

[knee obl (1 of 2)]
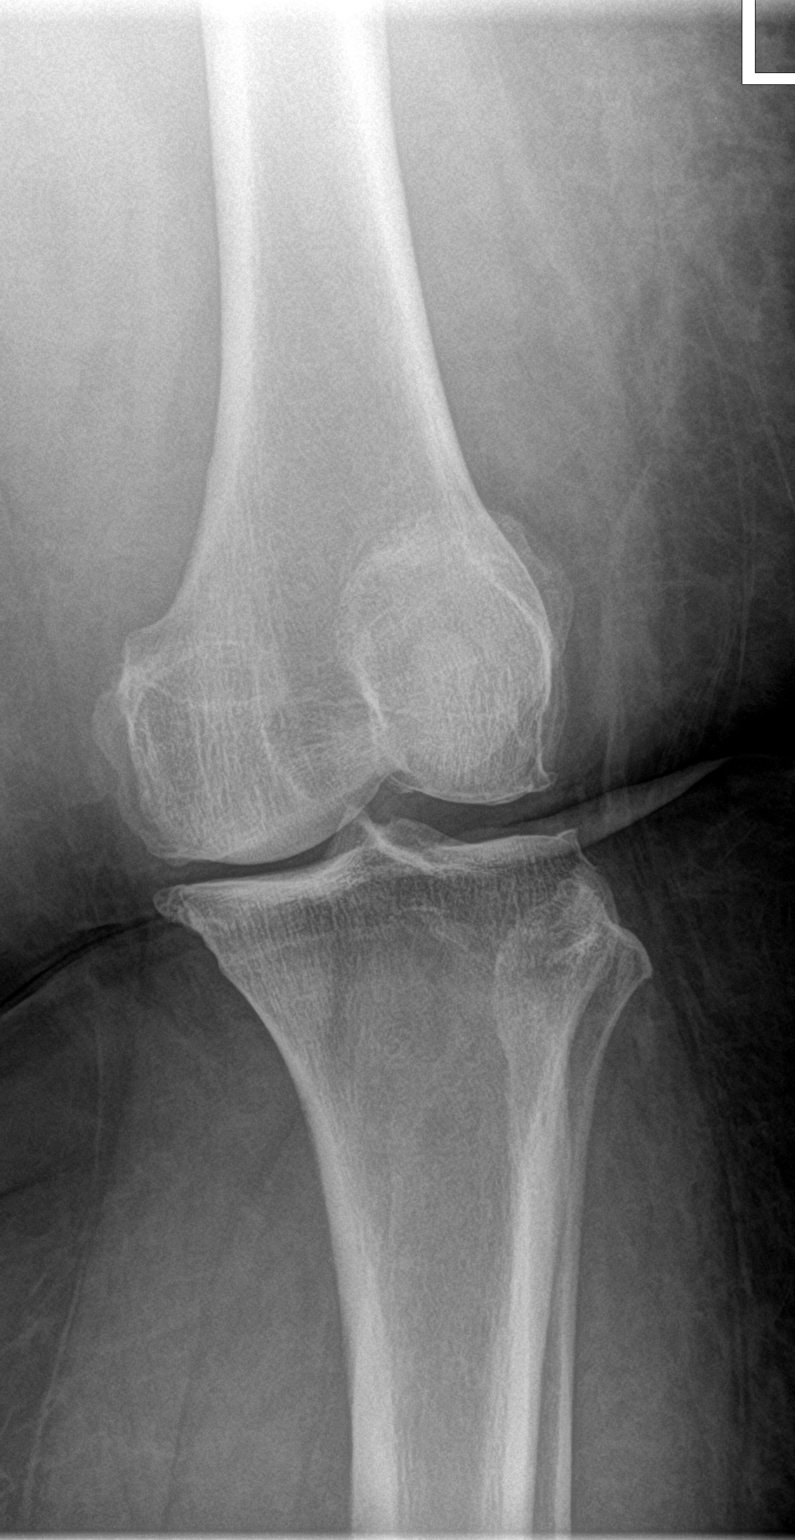

[knee obl (2 of 2)]
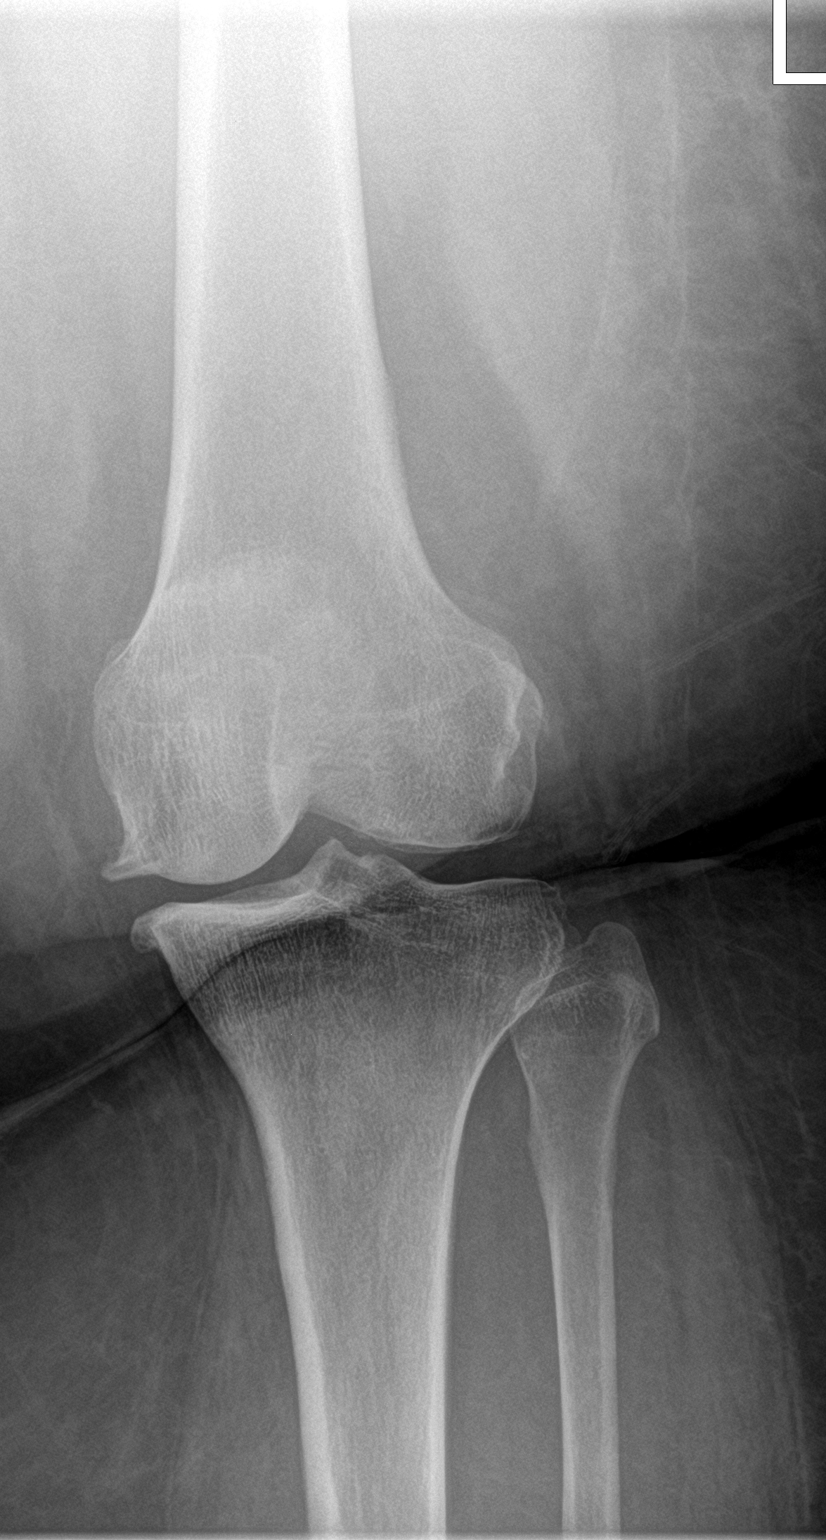

[4 of 4 positions shown; findings below may reference images not displayed]

FINDINGS: No evidence of fracture, dislocation, or joint effusion.
Tricompartmental mild to moderate degenerative changes of the knee.
Soft tissues are unremarkable.
IMPRESSION: No acute displaced fracture or dislocation.
